# Patient Record
Sex: Male | Born: 1945
Health system: Southern US, Community
[De-identification: ages and names within clinical notes are randomized; demographics above are authoritative.]

## PROBLEM LIST (undated history)

## (undated) DIAGNOSIS — Z8719 Personal history of other diseases of the digestive system: Secondary | ICD-10-CM

## (undated) DIAGNOSIS — K3580 Unspecified acute appendicitis: Secondary | ICD-10-CM

## (undated) DIAGNOSIS — Z950 Presence of cardiac pacemaker: Secondary | ICD-10-CM

## (undated) DIAGNOSIS — C801 Malignant (primary) neoplasm, unspecified: Secondary | ICD-10-CM

## (undated) DIAGNOSIS — M1990 Unspecified osteoarthritis, unspecified site: Secondary | ICD-10-CM

## (undated) DIAGNOSIS — I10 Essential (primary) hypertension: Secondary | ICD-10-CM

## (undated) DIAGNOSIS — I499 Cardiac arrhythmia, unspecified: Secondary | ICD-10-CM

## (undated) HISTORY — PX: CERVICAL SPINE SURGERY: SHX589

## (undated) HISTORY — PX: BACK SURGERY: SHX140

## (undated) HISTORY — PX: TRIGGER FINGER RELEASE: SHX641

---

## 2006-01-13 ENCOUNTER — Ambulatory Visit (HOSPITAL_COMMUNITY): Admission: RE | Admit: 2006-01-13 | Discharge: 2006-01-13 | Payer: Self-pay | Admitting: Gastroenterology

## 2007-05-08 ENCOUNTER — Emergency Department (HOSPITAL_COMMUNITY): Admission: EM | Admit: 2007-05-08 | Discharge: 2007-05-08 | Payer: Self-pay | Admitting: Emergency Medicine

## 2009-03-21 ENCOUNTER — Inpatient Hospital Stay (HOSPITAL_COMMUNITY): Admission: RE | Admit: 2009-03-21 | Discharge: 2009-03-23 | Payer: Self-pay | Admitting: Orthopedic Surgery

## 2010-07-24 LAB — CBC
HCT: 43.1 % (ref 39.0–52.0)
Hemoglobin: 14.9 g/dL (ref 13.0–17.0)
MCHC: 34.5 g/dL (ref 30.0–36.0)
MCV: 98.5 fL (ref 78.0–100.0)
Platelets: 255 10*3/uL (ref 150–400)
RBC: 4.38 MIL/uL (ref 4.22–5.81)
RDW: 12.6 % (ref 11.5–15.5)
WBC: 5.2 10*3/uL (ref 4.0–10.5)

## 2010-08-23 ENCOUNTER — Other Ambulatory Visit (HOSPITAL_COMMUNITY): Payer: Self-pay | Admitting: Orthopedic Surgery

## 2010-08-23 ENCOUNTER — Encounter (HOSPITAL_COMMUNITY)
Admission: RE | Admit: 2010-08-23 | Discharge: 2010-08-23 | Disposition: A | Discharge status: Home / Self Care | Payer: Worker's Compensation | Source: Ambulatory Visit | Attending: Orthopedic Surgery | Admitting: Orthopedic Surgery

## 2010-08-23 ENCOUNTER — Ambulatory Visit (HOSPITAL_COMMUNITY)
Admission: RE | Admit: 2010-08-23 | Discharge: 2010-08-23 | Disposition: A | Discharge status: Home / Self Care | Payer: Worker's Compensation | Source: Ambulatory Visit | Attending: Orthopedic Surgery | Admitting: Orthopedic Surgery

## 2010-08-23 DIAGNOSIS — Z01818 Encounter for other preprocedural examination: Secondary | ICD-10-CM | POA: Insufficient documentation

## 2010-08-23 DIAGNOSIS — M5136 Other intervertebral disc degeneration, lumbar region: Secondary | ICD-10-CM

## 2010-08-23 DIAGNOSIS — M545 Low back pain, unspecified: Secondary | ICD-10-CM | POA: Insufficient documentation

## 2010-08-23 DIAGNOSIS — M5137 Other intervertebral disc degeneration, lumbosacral region: Secondary | ICD-10-CM | POA: Insufficient documentation

## 2010-08-23 DIAGNOSIS — M51379 Other intervertebral disc degeneration, lumbosacral region without mention of lumbar back pain or lower extremity pain: Secondary | ICD-10-CM | POA: Insufficient documentation

## 2010-08-23 DIAGNOSIS — Z01812 Encounter for preprocedural laboratory examination: Secondary | ICD-10-CM | POA: Insufficient documentation

## 2010-08-23 LAB — CBC
HCT: 44.3 % (ref 39.0–52.0)
Hemoglobin: 15.4 g/dL (ref 13.0–17.0)
MCH: 32.4 pg (ref 26.0–34.0)
MCHC: 34.8 g/dL (ref 30.0–36.0)
MCV: 93.1 fL (ref 78.0–100.0)
Platelets: 242 10*3/uL (ref 150–400)
RBC: 4.76 MIL/uL (ref 4.22–5.81)
RDW: 12.4 % (ref 11.5–15.5)
WBC: 5.3 10*3/uL (ref 4.0–10.5)

## 2010-08-23 LAB — TYPE AND SCREEN
ABO/RH(D): O NEG
Antibody Screen: NEGATIVE

## 2010-08-23 LAB — SURGICAL PCR SCREEN
MRSA, PCR: NEGATIVE
Staphylococcus aureus: NEGATIVE

## 2010-08-23 LAB — ABO/RH: ABO/RH(D): O NEG

## 2010-08-29 ENCOUNTER — Inpatient Hospital Stay (HOSPITAL_COMMUNITY): Payer: Worker's Compensation

## 2010-08-29 ENCOUNTER — Inpatient Hospital Stay (HOSPITAL_COMMUNITY)
Admission: RE | Admit: 2010-08-29 | Discharge: 2010-08-31 | DRG: 460 | Disposition: A | Discharge status: Home Health | Payer: Worker's Compensation | Source: Ambulatory Visit | Attending: Orthopedic Surgery | Admitting: Orthopedic Surgery

## 2010-08-29 DIAGNOSIS — M51379 Other intervertebral disc degeneration, lumbosacral region without mention of lumbar back pain or lower extremity pain: Principal | ICD-10-CM | POA: Diagnosis present

## 2010-08-29 DIAGNOSIS — M545 Low back pain, unspecified: Secondary | ICD-10-CM

## 2010-08-29 DIAGNOSIS — M5137 Other intervertebral disc degeneration, lumbosacral region: Principal | ICD-10-CM | POA: Diagnosis present

## 2010-08-30 ENCOUNTER — Inpatient Hospital Stay (HOSPITAL_COMMUNITY): Payer: Worker's Compensation

## 2010-08-30 DIAGNOSIS — M79609 Pain in unspecified limb: Secondary | ICD-10-CM

## 2010-09-02 NOTE — Op Note (Signed)
William Avila, William Avila           ACCOUNT NO.:  [id number]  MEDICAL RECORD NO.:  [id number]           PATIENT TYPE:  I  LOCATION:  5005                         FACILITY:  [facility]  PHYSICIAN:  Juleen China IV, MDDATE OF BIRTH:  [birth year]  DATE OF PROCEDURE:  08/29/2010 DATE OF DISCHARGE:                              OPERATIVE REPORT   PREOPERATIVE DIAGNOSIS:  Degenerative back disease, L4-L5, L5-S1.  POSTOPERATIVE DIAGNOSIS:  Degenerative back disease, L4-L5, L5-S1.  PROCEDURE PERFORMED:  Anterior exposure of the L4-L5, L5-S1 vertebral bodies.  SURGEON: 1. Charlena Cross, MD  CO-SURGEON:  Alvy Beal, MD.  ANESTHESIA:  General.  BLOOD LOSS:  See anesthesia record.  COMPLICATIONS:  None.  INDICATION:  This is a 65 year old gentleman who Dr. Shon Baton has evaluated and deemed a candidate for anterior instrumentation of the L4- L5, L5-S1 vertebral bodies.  I have been asked to perform anterior exposure.  I met the patient in the preoperative holding area and discussed the risks and benefits including injury to the artery, nerve, and ureter as well as possible complications of retrograde ejaculation. The patient understands all these and wishes to proceed.  DESCRIPTION OF PROCEDURE:  The patient was identified in the holding area and taken to room 15, placed supine on the table.  General endotracheal anesthesia was administered.  The patient was prepped and draped in usual fashion.  Time-out was called.  Antibiotics were given. Pulse oximeter monitor was placed on the patient's left foot.  Fluoroscopy was used to confirm the location of the vertebral bodies and was marked with an ink pen.  A paramedian incision was made below the umbilicus on the left side.  Cautery was used to divide the subcutaneous tissue.  The abdominal wall fascia was identified and then opened with cautery throughout the length of the incision.  The rectus muscle was mobilized along its  medial border.  The retroperitoneal space was identified and entered and then the abdominal contents were bluntly swept medially and superiorly.  We identified the external iliac artery and vein as well as the ureter.  We got all into the wrong plane and, therefore, entered the retroperitoneal space from the lateral edge of the rectus, again sweeping the abdominal contents and peritoneum medially and superiorly.  There were a few rents in the peritoneum which were closed with 3-0 interrupted Vicryl.  Once in the appropriate plane, the external iliac artery and vein were skeletonized.  The artery was with minimal calcification.  The ureter had to be dissected free a little bit as it was somewhat adherent and took somewhat to serve tissue scores in the wound, it was fully protected.  A Thompson retractor was placed and the L4-L5 disk space was exposed with the artery and vein swept lateral within the crotch of the bifurcation.  Dr. Shon Baton then performed L5-S1 instrumentation.  Once this was done, I came back into the room and provided exposure of the L4-L5 disk space.  In order to do this, a large iliolumbar vein was divided between 3-0 silk ties.  Two other small iliolumbar branches were divided between metal clips.  At this time, the  artery and vein were swept medially.  The Thompson retractor was replaced exposing the L4-L5 disk space.  Dr. Shon Baton then proceeded with instrumentation of the L4-L5 disk space.  Once he was completed, I came back into the room and inspected for hemostasis. Bleeding was minimal.  Appropriate hemostatic agents were placed.  The patient had a palpable left femoral pulse with no visual evidence of injury to the artery, vein, or ureter.  The contents were placed back into their anatomic positions and the fascia was closed with a running #1 PDS suture, the subcutaneous tissue was closed with 2-0 Vicryl, the skin was closed 4-0 Vicryl.  The patient tolerated the  procedure well. There were no immediate complications.     William Ny, MD     VWB/MEDQ  D:  08/30/2010  T:  08/30/2010  Job:  [id number]  Electronically Signed by Arelia Longest IV MD on 09/02/2010 11:00:16 PM

## 2010-09-02 NOTE — Op Note (Signed)
William Avila, William NO.:  [id number]  MEDICAL RECORD NO.:  [id number]           PATIENT TYPE:  I  LOCATION:  5005                         FACILITY:  [facility]  PHYSICIAN:  Alvy Beal, MD    DATE OF BIRTH:  Jun 02, 1945  DATE OF PROCEDURE:  08/29/2010 DATE OF DISCHARGE:                              OPERATIVE REPORT   PREOPERATIVE DIAGNOSIS:  Diskogenic low back pain L4-5, L5-S1.  POSTOPERATIVE DIAGNOSIS:  Diskogenic low back pain L4-5, L5-S1.  OPERATIVE PROCEDURE:  Anterior lumbar interbody fusion L4-5, L5-S1.  APPROACH SURGEON: 1. Durene Cal IV, MD  HISTORY:  This is a very pleasant 65 year old gentleman who I have been taking care for sometime.  He has been complaining of severe progressive disabling low back, buttock pain.  Attempts at conservative management had all failed to alleviate his symptoms.  After discussing treatment options we elected to proceed with surgery.  All appropriate risks, benefits and alternatives were discussed with the patient.  Consent was obtained.  Intraoperative device system used was the RSB InterPlate, PEEK spacer with the RSB zero profile anterior lumbar plate.  At L5-S1, I used a 14 8-degree lordotic large PEEK spacer with appropriate sized zero profile plate with the gold screws.  At L4-5 used a 16 mm high 8- degree lordotic large PEEK spacer with appropriate sized zero profile plate.  COMPLICATIONS:  None.  CONDITION:  Stable.  OPERATIVE NOTE:  The patient was brought to the operating room and placed supine on the operating table.  After successful induction of general anesthesia, endotracheal intubation, TEDs, SCDs and Foley were inserted.  The abdomen was prepped and draped in the usual fashion. Appropriate time-out was done to confirm patient, procedure and all other pertinent important data.  Once this was done, Dr. Durene Cal then performed a standard retroperitoneal approach to the lumbar  spine. Please refer to his dictation for specifics.  Once the [place] blades were placed, we had an excellent exposure at the L5-S1 level.  I then incised the annulus with a 10 blade scalpel, then used a combination of various pituitary rongeurs, curettes and Kerrison rongeurs to remove all the disk material.  Once I had the bulk of the diskectomy completed I used a small curved curette to release the annulus from the posterior aspect of the L5-S1 vertebral bodies.  Once I had done this, I was able to use a 2-mm Kerrison rongeur to remove the remaining portion of the annulus to adequately decompress and remove the bone spurs from the back of the vertebral bodies.  Once I had an adequate diskectomy and decompression, I rasped the endplates and used trial devices to determine the appropriate size.  Once I had the appropriate sized placed, I then aspirated 10 mL of blood from the vertebral body, mixed the chronOS and then packed the 14 8-degree lordotic large cage with chronOS and EBX.  This was malleted to the appropriate depth.  I then took the appropriate size large zero profile plate, secured it at the appropriate depth and then placed the two 25-mm gold screws going cephalad into the L5 vertebral body and one caudad into  the S1 vertebral body.  I had excellent purchase with the screws. I then placed a locking cap and torqued into the appropriate tension. At this point, Dr. Myra Gianotti then scrubbed back into the case.  We irrigated the wound copiously with normal saline and then he repositioned the retractors to now expose the L4-5 disk space.  Using the same technique I used at L5-S1, I removed all the disk material at L4-5.  Again, I released the annulus from the posterior aspect of the vertebral body with a small hook curette, and then used 3- mm Kerrison to remove the remains of it.  I then rasped the endplates so I had excellent bleeding bone and this time I used a 16 trial  with better fit.  I packed the 16 8-degree lordotic cage and then malleted into the appropriate depth.  I then took the appropriate size zero profile plate, malleted it to the appropriate position and then secured it in the same fashion.  At this point, Dr. Myra Gianotti and I irrigated copiously with normal saline and made sure we had hemostasis.  Once hemostasis was confirmed, we removed the retractors and the wound remained dry.  He then closed the wound in an appropriate fashion.  The patient was extubated and transferred to the PACU without incident.  At the end of the case, all needle and sponge counts were correct.     Alvy Beal, MD     DDB/MEDQ  D:  08/29/2010  T:  08/30/2010  Job:  [id number]  Electronically Signed by Venita Lick MD on 09/02/2010 08:40:49 PM

## 2010-09-06 ENCOUNTER — Ambulatory Visit (HOSPITAL_COMMUNITY)
Admission: RE | Admit: 2010-09-06 | Discharge: 2010-09-06 | Disposition: A | Discharge status: Home / Self Care | Payer: Worker's Compensation | Source: Ambulatory Visit | Attending: Orthopedic Surgery | Admitting: Orthopedic Surgery

## 2010-09-06 DIAGNOSIS — M79609 Pain in unspecified limb: Secondary | ICD-10-CM | POA: Insufficient documentation

## 2010-09-06 DIAGNOSIS — M7989 Other specified soft tissue disorders: Secondary | ICD-10-CM | POA: Insufficient documentation

## 2010-09-14 ENCOUNTER — Emergency Department (HOSPITAL_COMMUNITY)
Admission: EM | Admit: 2010-09-14 | Discharge: 2010-09-15 | Disposition: A | Discharge status: Home / Self Care | Payer: Worker's Compensation | Attending: Emergency Medicine | Admitting: Emergency Medicine

## 2010-09-14 DIAGNOSIS — I10 Essential (primary) hypertension: Secondary | ICD-10-CM | POA: Insufficient documentation

## 2010-09-14 DIAGNOSIS — M549 Dorsalgia, unspecified: Secondary | ICD-10-CM | POA: Insufficient documentation

## 2010-09-14 DIAGNOSIS — M79609 Pain in unspecified limb: Secondary | ICD-10-CM | POA: Insufficient documentation

## 2010-09-14 LAB — URINALYSIS, ROUTINE W REFLEX MICROSCOPIC
Bilirubin Urine: NEGATIVE
Glucose, UA: NEGATIVE mg/dL
Hgb urine dipstick: NEGATIVE
Ketones, ur: NEGATIVE mg/dL
Nitrite: NEGATIVE
Protein, ur: NEGATIVE mg/dL
Specific Gravity, Urine: 1.019 (ref 1.005–1.030)
Urobilinogen, UA: 0.2 mg/dL (ref 0.0–1.0)
pH: 7.5 (ref 5.0–8.0)

## 2010-09-14 LAB — BASIC METABOLIC PANEL
BUN: 13 mg/dL (ref 6–23)
CO2: 28 mEq/L (ref 19–32)
Calcium: 9.4 mg/dL (ref 8.4–10.5)
Chloride: 99 mEq/L (ref 96–112)
Creatinine, Ser: 0.97 mg/dL (ref 0.4–1.5)
GFR calc Af Amer: >60 mL/min (ref >60)
GFR calc non Af Amer: >60 mL/min (ref >60)
Glucose, Bld: 102 mg/dL — ABNORMAL HIGH (ref 70–99)
Potassium: 3.9 mEq/L (ref 3.5–5.1)
Sodium: 138 mEq/L (ref 135–145)

## 2010-09-14 LAB — SEDIMENTATION RATE: Sed Rate: 11 mm/hr (ref 0–16)

## 2010-09-14 LAB — CBC
HCT: 39.6 % (ref 39.0–52.0)
Hemoglobin: 13.7 g/dL (ref 13.0–17.0)
MCH: 32.7 pg (ref 26.0–34.0)
MCHC: 34.6 g/dL (ref 30.0–36.0)
MCV: 94.5 fL (ref 78.0–100.0)
Platelets: 475 10*3/uL — ABNORMAL HIGH (ref 150–400)
RBC: 4.19 MIL/uL — ABNORMAL LOW (ref 4.22–5.81)
RDW: 12.5 % (ref 11.5–15.5)
WBC: 8.3 10*3/uL (ref 4.0–10.5)

## 2010-09-14 LAB — C-REACTIVE PROTEIN: CRP: 0.5 mg/dL — ABNORMAL LOW (ref <0.6)

## 2010-10-06 NOTE — Discharge Summary (Signed)
  NAMELEX, LINHARES NO.:  [id number]  MEDICAL RECORD NO.:  [id number]  LOCATION:  5005                         FACILITY:  [facility]  PHYSICIAN:  Alvy Beal, MD    DATE OF BIRTH:  18-Jan-1946  DATE OF ADMISSION:  08/29/2010 DATE OF DISCHARGE:  08/31/2010                              DISCHARGE SUMMARY   ADMITTING DIAGNOSIS:  Diskogenic low back pain, L4-L5, L5-S1.  HISTORY:  This is a very pleasant 65 year old gentleman who I had been taking care over the past.  He has been having significant progressive low back, buttock, and bilateral leg pain ever since a work-related injury.  After failed attempts at conservative management, he elected to proceed with surgery.  All appropriate risks, benefits, and alternatives were discussed with the patient and consent was obtained.  Please refer to the included office notes in his hospital chart for specifics on the past medical, surgical, family, and social history.  HOSPITAL COURSE:  On the date of admission, the patient was taken to the operating room for anterior lumbar interbody fusion at L4-L5 and L5-S1. Surgery was uneventful.  There were no significant intraoperative complications.  On postoperative day #1, he was alert and oriented x3.  Compartments were soft and nontender and his pain was well controlled.  The patient had a lower extremity Doppler test done which was negative for DVT. Postoperative x-rays were satisfactory.  Hardware and grafts were in good position.  The patient remained with intact peripheral pulses.  He had cleared PT.  As a result, he was discharged to home on Aug 31, 2010 with appropriate medications and postoperative preprinted instructions. The patient had a followup arranged with me.     Alvy Beal, MD     DDB/MEDQ  D:  10/01/2010  T:  10/02/2010  Job:  [id number]  Electronically Signed by Venita Lick MD on 10/06/2010 07:21:56 AM

## 2011-01-10 LAB — I-STAT 8, (EC8 V) (CONVERTED LAB)
Acid-base deficit: 2
BUN: 17
Bicarbonate: 23.2
Chloride: 104
Glucose, Bld: 133 — ABNORMAL HIGH
HCT: 54 — ABNORMAL HIGH
Hemoglobin: 18.4 — ABNORMAL HIGH
Operator id: 294511
Potassium: 3.4 — ABNORMAL LOW
Sodium: 140
TCO2: 24
pCO2, Ven: 41.6 — ABNORMAL LOW
pH, Ven: 7.355 — ABNORMAL HIGH

## 2011-01-10 LAB — POCT I-STAT CREATININE
Creatinine, Ser: 1.3
Operator id: 294511

## 2013-02-23 ENCOUNTER — Ambulatory Visit
Admission: RE | Admit: 2013-02-23 | Discharge: 2013-02-23 | Disposition: A | Discharge status: Home / Self Care | Payer: Self-pay | Source: Ambulatory Visit | Attending: Internal Medicine | Admitting: Internal Medicine

## 2013-02-23 ENCOUNTER — Other Ambulatory Visit: Payer: Self-pay | Admitting: Internal Medicine

## 2013-02-23 DIAGNOSIS — M79641 Pain in right hand: Secondary | ICD-10-CM

## 2014-01-20 ENCOUNTER — Encounter (HOSPITAL_COMMUNITY): Payer: Self-pay | Admitting: Emergency Medicine

## 2014-01-20 ENCOUNTER — Inpatient Hospital Stay (HOSPITAL_COMMUNITY)
Admission: EM | Admit: 2014-01-20 | Discharge: 2014-01-23 | DRG: 343 | Disposition: A | Discharge status: Home / Self Care | Payer: Medicare Other | Attending: General Surgery | Admitting: General Surgery

## 2014-01-20 DIAGNOSIS — R109 Unspecified abdominal pain: Secondary | ICD-10-CM | POA: Diagnosis not present

## 2014-01-20 DIAGNOSIS — Z885 Allergy status to narcotic agent status: Secondary | ICD-10-CM | POA: Diagnosis not present

## 2014-01-20 DIAGNOSIS — K3580 Unspecified acute appendicitis: Principal | ICD-10-CM | POA: Diagnosis present

## 2014-01-20 DIAGNOSIS — Z87891 Personal history of nicotine dependence: Secondary | ICD-10-CM | POA: Diagnosis not present

## 2014-01-20 DIAGNOSIS — K37 Unspecified appendicitis: Secondary | ICD-10-CM | POA: Diagnosis present

## 2014-01-20 MED ORDER — ACETAMINOPHEN 650 MG RE SUPP: 650.0000 mg | Freq: Four times a day (QID) | RECTAL | Status: DC | PRN

## 2014-01-20 MED ORDER — ONDANSETRON HCL 4 MG/2ML IJ SOLN
4.0000 mg | Freq: Four times a day (QID) | INTRAMUSCULAR | Status: DC | PRN
  Administered 2014-01-22 (×2): 4 mg via INTRAVENOUS
  Filled 2014-01-20 (×2): qty 2

## 2014-01-20 MED ORDER — ENOXAPARIN SODIUM 40 MG/0.4ML ~~LOC~~ SOLN
40.0000 mg | SUBCUTANEOUS | Status: DC
  Filled 2014-01-20: qty 0.4

## 2014-01-20 MED ORDER — SODIUM CHLORIDE 0.9 % IV BOLUS (SEPSIS)
1000.0000 mL | Freq: Once | INTRAVENOUS | Status: AC
  Administered 2014-01-20: 1000 mL via INTRAVENOUS

## 2014-01-20 MED ORDER — SODIUM CHLORIDE 0.9 % IV SOLN
INTRAVENOUS | Status: DC
  Administered 2014-01-20: via INTRAVENOUS

## 2014-01-20 MED ORDER — PIPERACILLIN-TAZOBACTAM 3.375 G IVPB
3.3750 g | Freq: Three times a day (TID) | INTRAVENOUS | Status: DC
  Administered 2014-01-21 – 2014-01-23 (×8): 3.375 g via INTRAVENOUS
  Filled 2014-01-20 (×11): qty 50

## 2014-01-20 MED ORDER — PANTOPRAZOLE SODIUM 40 MG IV SOLR
40.0000 mg | Freq: Every day | INTRAVENOUS | Status: DC
  Administered 2014-01-20 – 2014-01-22 (×3): 40 mg via INTRAVENOUS
  Filled 2014-01-20 (×4): qty 40

## 2014-01-20 MED ORDER — HYDROMORPHONE HCL 1 MG/ML IJ SOLN
1.0000 mg | INTRAMUSCULAR | Status: DC | PRN
  Administered 2014-01-21: 1 mg via INTRAVENOUS
  Filled 2014-01-20: qty 1

## 2014-01-20 MED ORDER — ACETAMINOPHEN 325 MG PO TABS
650.0000 mg | ORAL_TABLET | Freq: Four times a day (QID) | ORAL | Status: DC | PRN
  Administered 2014-01-22 – 2014-01-23 (×3): 650 mg via ORAL
  Filled 2014-01-20 (×3): qty 2

## 2014-01-20 NOTE — ED Notes (Signed)
Per [facility] pt is from [facility], reports pt is having an acute appendicitis. Pt reports RLQ abdominal pain started on Wednesday with associated N/V/D. [place] adm 4mg  of morphine, 2 mg of dilaudid, 4mg  of zofran. Dr. Donne Hazel to see. [facility] placed pt on 2L 02 as pt's 02 sats were 89% on RA. Pt is A&O X4.

## 2014-01-20 NOTE — ED Provider Notes (Signed)
9:31 PM Patient sent from [facility].  Pain controlled at this time.  Abdominal exam: soft, nondistended, TTP RUQ, RLQ, no guarding or rebound.  Dr Donne Hazel to see patient, I have asked secretary to page him.  No needs at this time.  Pt reports NPO since 2pm.   Clayton Bibles, PA-C 01/20/14 [id number]

## 2014-01-20 NOTE — H&P (Signed)
William Avila is an 68 y.o. male.   Chief Complaint: ab pain HPI: 68yo otherwise healthy male who is up to date on his colonscopies who began having some lower abdominal pain on Wednesday that has persisted. It is mostly in his rlq now.  He has not been eating. He has not been able to drink without dry heaves. Passing some flatus, no bm.  No fevers. He has not voided since early this am and has not received any fluid per his report.  He complains of only rlq pain that is better with pain meds. He presented to [facility] hospital and was transferred here. His wbc was 20, k 3.4, cr nl. I have reviewed ct and he has distended appendix it appears with possibly some extraluminal dots of air and some inflammation.  He also has a large calcified mass that appears according to radiology to come from right adrenal. He denies any prior history.  This will need evaluation at later date also.  History reviewed. No pertinent past medical history.  Past Surgical History  Procedure Laterality Date  . Back surgery    cervical and lumbar surgery  No family history on file. Social History:  reports that he has quit smoking. His smoking use included Cigarettes. He smoked 0.00 packs per day. He does not have any smokeless tobacco history on file. He reports that he drinks alcohol. His drug history is not on file.  Allergies:  Allergies  Allergen Reactions  . Codeine Anaphylaxis  . Ibuprofen Anaphylaxis    meds reviewed  Review of Systems  Constitutional: Negative for fever and chills.  Respiratory: Negative for shortness of breath.   Cardiovascular: Negative for chest pain.  Gastrointestinal: Positive for nausea, vomiting, abdominal pain and diarrhea.  Genitourinary: Negative for dysuria, urgency and frequency.    Blood pressure 151/87, pulse 64, temperature 99.1 F (37.3 C), temperature source Oral, resp. rate 18, height 6' (1.829 m), weight 176 lb (79.833 kg), SpO2 93.00%. Physical Exam  Vitals  reviewed. Constitutional: He is oriented to person, place, and time. He appears well-developed and well-nourished. No distress.  Eyes: No scleral icterus.  Cardiovascular: Normal rate, regular rhythm and normal heart sounds.   Respiratory: Effort normal and breath sounds normal. He has no wheezes. He has no rales.  GI: Soft. Bowel sounds are normal. There is tenderness in the right lower quadrant. There is tenderness at McBurney's point.  Neurological: He is alert and oriented to person, place, and time.     Assessment/Plan appendicitis  His heart rate is nl, bp normal, only localized rlq tenderness.  He has not really been resuscitated at all at this point. I am going to give bolus, put on iv fluids to hydrate him. Will recheck labs early am and plan for lap appy tomorrow. He has been given zosyn and I will keep him on that for that reason.    Marcy Sookdeo 01/20/2014, 10:32 PM

## 2014-01-20 NOTE — Progress Notes (Signed)
ANTIBIOTIC CONSULT NOTE - INITIAL  Pharmacy Consult for zosyn Indication: appendicitis   Allergies  Allergen Reactions  . Codeine Anaphylaxis  . Ibuprofen Anaphylaxis    Patient Measurements: Height: 6' (182.9 cm) Weight: 176 lb (79.833 kg) IBW/kg (Calculated) : 77.6   Vital Signs: Temp: 99.1 F (37.3 C) (10/02 2108) Temp Source: Oral (10/02 2108) BP: 157/92 mmHg (10/02 2300) Pulse Rate: 65 (10/02 2300) Intake/Output from previous day:   Intake/Output from this shift:    Labs: No results found for this basename: WBC, HGB, PLT, LABCREA, CREATININE,  in the last 72 hours Estimated Creatinine Clearance: 80 ml/min (by C-G formula based on Cr of 0.97). No results found for this basename: VANCOTROUGH, VANCOPEAK, VANCORANDOM, GENTTROUGH, GENTPEAK, GENTRANDOM, TOBRATROUGH, TOBRAPEAK, TOBRARND, AMIKACINPEAK, AMIKACINTROU, AMIKACIN,  in the last 72 hours   Microbiology: No results found for this or any previous visit (from the past 720 hour(s)).  Medical History: History reviewed. No pertinent past medical history.  Medications:  Prescriptions prior to admission  Medication Sig Dispense Refill  . Multiple Vitamins-Minerals (MULTIVITAMIN WITH MINERALS) tablet Take 1 tablet by mouth daily.       Assessment: 68 yo man transferred from [facility] for evaluation of abdominal pain.  He received zosyn 4.5 gm @ 18:28.  His SrCr is 0.9 mg/dL.  Goal of Therapy:  Eradication of infection  Plan:  Zosyn 3.375 gm IV q8 hours. Monitor clinical course.  Thanks for allowing pharmacy to be a part of this patient's care.  Excell Seltzer, PharmD Clinical Pharmacist, [phone] 01/20/2014,11:34 PM

## 2014-01-20 NOTE — ED Notes (Signed)
MD at bedside. 

## 2014-01-21 ENCOUNTER — Encounter (HOSPITAL_COMMUNITY): Admission: EM | Disposition: A | Discharge status: Home / Self Care | Payer: Self-pay | Source: Home / Self Care

## 2014-01-21 ENCOUNTER — Encounter (HOSPITAL_COMMUNITY): Payer: Medicare Other | Admitting: Certified Registered"

## 2014-01-21 ENCOUNTER — Inpatient Hospital Stay (HOSPITAL_COMMUNITY): Payer: Medicare Other | Admitting: Certified Registered"

## 2014-01-21 DIAGNOSIS — K3580 Unspecified acute appendicitis: Secondary | ICD-10-CM

## 2014-01-21 HISTORY — PX: LAPAROSCOPIC APPENDECTOMY: SHX408

## 2014-01-21 HISTORY — DX: Unspecified acute appendicitis: K35.80

## 2014-01-21 LAB — SURGICAL PCR SCREEN
MRSA, PCR: NEGATIVE
Staphylococcus aureus: NEGATIVE

## 2014-01-21 LAB — BASIC METABOLIC PANEL
Anion gap: 13 (ref 5–15)
BUN: 12 mg/dL (ref 6–23)
CO2: 23 mEq/L (ref 19–32)
Calcium: 8.8 mg/dL (ref 8.4–10.5)
Chloride: 103 mEq/L (ref 96–112)
Creatinine, Ser: 0.84 mg/dL (ref 0.50–1.35)
GFR calc Af Amer: >90 mL/min (ref >90)
GFR calc non Af Amer: 88 mL/min — ABNORMAL LOW (ref >90)
Glucose, Bld: 134 mg/dL — ABNORMAL HIGH (ref 70–99)
Potassium: 4 mEq/L (ref 3.7–5.3)
Sodium: 139 mEq/L (ref 137–147)

## 2014-01-21 LAB — CBC
HCT: 39.5 % (ref 39.0–52.0)
Hemoglobin: 14 g/dL (ref 13.0–17.0)
MCH: 32 pg (ref 26.0–34.0)
MCHC: 35.4 g/dL (ref 30.0–36.0)
MCV: 90.4 fL (ref 78.0–100.0)
Platelets: 230 10*3/uL (ref 150–400)
RBC: 4.37 MIL/uL (ref 4.22–5.81)
RDW: 12.9 % (ref 11.5–15.5)
WBC: 17.7 10*3/uL — ABNORMAL HIGH (ref 4.0–10.5)

## 2014-01-21 SURGERY — APPENDECTOMY, LAPAROSCOPIC
Anesthesia: General | Site: Abdomen

## 2014-01-21 MED ORDER — OXYCODONE HCL 5 MG PO TABS
ORAL_TABLET | ORAL | Status: AC
  Administered 2014-01-21: 5 mg via ORAL
  Filled 2014-01-21: qty 1

## 2014-01-21 MED ORDER — NEOSTIGMINE METHYLSULFATE 10 MG/10ML IV SOLN
INTRAVENOUS | Status: DC | PRN
  Administered 2014-01-21: 4 mg via INTRAVENOUS

## 2014-01-21 MED ORDER — PHENYLEPHRINE HCL 10 MG/ML IJ SOLN
INTRAMUSCULAR | Status: DC | PRN
  Administered 2014-01-21: 80 ug via INTRAVENOUS

## 2014-01-21 MED ORDER — OXYCODONE HCL 5 MG/5ML PO SOLN: 5.0000 mg | Freq: Once | ORAL | Status: AC | PRN

## 2014-01-21 MED ORDER — HYDROMORPHONE HCL 1 MG/ML IJ SOLN
0.2500 mg | INTRAMUSCULAR | Status: DC | PRN
  Administered 2014-01-21 (×4): 0.5 mg via INTRAVENOUS

## 2014-01-21 MED ORDER — BUPIVACAINE-EPINEPHRINE (PF) 0.25% -1:200000 IJ SOLN
INTRAMUSCULAR | Status: AC
  Filled 2014-01-21: qty 30

## 2014-01-21 MED ORDER — LIDOCAINE HCL (CARDIAC) 20 MG/ML IV SOLN
INTRAVENOUS | Status: DC | PRN
  Administered 2014-01-21: 80 mg via INTRAVENOUS

## 2014-01-21 MED ORDER — EPHEDRINE SULFATE 50 MG/ML IJ SOLN
INTRAMUSCULAR | Status: DC | PRN
  Administered 2014-01-21: 10 mg via INTRAVENOUS

## 2014-01-21 MED ORDER — SODIUM CHLORIDE 0.9 % IR SOLN
Status: DC | PRN
  Administered 2014-01-21: 1000 mL

## 2014-01-21 MED ORDER — LACTATED RINGERS IV SOLN
INTRAVENOUS | Status: DC | PRN
Start: 2014-01-21 — End: 2014-01-21
  Administered 2014-01-21 (×2): via INTRAVENOUS

## 2014-01-21 MED ORDER — HEPARIN SODIUM (PORCINE) 5000 UNIT/ML IJ SOLN
5000.0000 [IU] | Freq: Three times a day (TID) | INTRAMUSCULAR | Status: DC
  Administered 2014-01-22 – 2014-01-23 (×4): 5000 [IU] via SUBCUTANEOUS
  Filled 2014-01-21 (×7): qty 1

## 2014-01-21 MED ORDER — FENTANYL CITRATE 0.05 MG/ML IJ SOLN
50.0000 ug | INTRAMUSCULAR | Status: DC | PRN
  Administered 2014-01-21 – 2014-01-23 (×7): 50 ug via INTRAVENOUS
  Filled 2014-01-21 (×7): qty 2

## 2014-01-21 MED ORDER — PROPOFOL 10 MG/ML IV BOLUS
INTRAVENOUS | Status: DC | PRN
  Administered 2014-01-21: 120 mg via INTRAVENOUS

## 2014-01-21 MED ORDER — BUPIVACAINE-EPINEPHRINE 0.25% -1:200000 IJ SOLN
INTRAMUSCULAR | Status: DC | PRN
  Administered 2014-01-21: 30 mL

## 2014-01-21 MED ORDER — DEXAMETHASONE SODIUM PHOSPHATE 10 MG/ML IJ SOLN
INTRAMUSCULAR | Status: DC | PRN
  Administered 2014-01-21: 4 mg via INTRAVENOUS

## 2014-01-21 MED ORDER — HYDROMORPHONE HCL 1 MG/ML IJ SOLN
INTRAMUSCULAR | Status: AC
  Administered 2014-01-21: 0.5 mg via INTRAVENOUS
  Filled 2014-01-21: qty 1

## 2014-01-21 MED ORDER — KCL IN DEXTROSE-NACL 20-5-0.9 MEQ/L-%-% IV SOLN
INTRAVENOUS | Status: DC
  Administered 2014-01-21 – 2014-01-23 (×4): via INTRAVENOUS
  Filled 2014-01-21 (×6): qty 1000

## 2014-01-21 MED ORDER — ONDANSETRON HCL 4 MG PO TABS: 4.0000 mg | ORAL_TABLET | Freq: Four times a day (QID) | ORAL | Status: DC | PRN

## 2014-01-21 MED ORDER — SCOPOLAMINE 1 MG/3DAYS TD PT72
MEDICATED_PATCH | TRANSDERMAL | Status: AC
  Filled 2014-01-21: qty 1

## 2014-01-21 MED ORDER — FENTANYL CITRATE 0.05 MG/ML IJ SOLN
INTRAMUSCULAR | Status: DC | PRN
  Administered 2014-01-21 (×5): 50 ug via INTRAVENOUS

## 2014-01-21 MED ORDER — GLYCOPYRROLATE 0.2 MG/ML IJ SOLN
INTRAMUSCULAR | Status: DC | PRN
  Administered 2014-01-21: 0.6 mg via INTRAVENOUS

## 2014-01-21 MED ORDER — SCOPOLAMINE 1 MG/3DAYS TD PT72
1.0000 | MEDICATED_PATCH | Freq: Once | TRANSDERMAL | Status: DC
  Administered 2014-01-21: 1.5 mg via TRANSDERMAL

## 2014-01-21 MED ORDER — ROCURONIUM BROMIDE 100 MG/10ML IV SOLN
INTRAVENOUS | Status: DC | PRN
  Administered 2014-01-21: 10 mg via INTRAVENOUS
  Administered 2014-01-21: 30 mg via INTRAVENOUS

## 2014-01-21 MED ORDER — ONDANSETRON HCL 4 MG/2ML IJ SOLN: 4.0000 mg | Freq: Four times a day (QID) | INTRAMUSCULAR | Status: DC | PRN

## 2014-01-21 MED ORDER — ROCURONIUM BROMIDE 50 MG/5ML IV SOLN
INTRAVENOUS | Status: AC
  Filled 2014-01-21: qty 1

## 2014-01-21 MED ORDER — MIDAZOLAM HCL 2 MG/2ML IJ SOLN
INTRAMUSCULAR | Status: AC
  Filled 2014-01-21: qty 2

## 2014-01-21 MED ORDER — ONDANSETRON HCL 4 MG/2ML IJ SOLN
INTRAMUSCULAR | Status: DC | PRN
  Administered 2014-01-21: 4 mg via INTRAVENOUS

## 2014-01-21 MED ORDER — FENTANYL CITRATE 0.05 MG/ML IJ SOLN
INTRAMUSCULAR | Status: AC
Start: 2014-01-21 — End: 2014-01-21
  Filled 2014-01-21: qty 5

## 2014-01-21 MED ORDER — 0.9 % SODIUM CHLORIDE (POUR BTL) OPTIME
TOPICAL | Status: DC | PRN
  Administered 2014-01-21: 1000 mL

## 2014-01-21 MED ORDER — SUCCINYLCHOLINE CHLORIDE 20 MG/ML IJ SOLN
INTRAMUSCULAR | Status: DC | PRN
  Administered 2014-01-21: 100 mg via INTRAVENOUS

## 2014-01-21 MED ORDER — OXYCODONE HCL 5 MG PO TABS
5.0000 mg | ORAL_TABLET | Freq: Once | ORAL | Status: AC | PRN
  Administered 2014-01-21: 5 mg via ORAL

## 2014-01-21 MED ORDER — SUCCINYLCHOLINE CHLORIDE 20 MG/ML IJ SOLN
INTRAMUSCULAR | Status: AC
  Filled 2014-01-21: qty 1

## 2014-01-21 MED ORDER — MIDAZOLAM HCL 5 MG/5ML IJ SOLN
INTRAMUSCULAR | Status: DC | PRN
  Administered 2014-01-21: 2 mg via INTRAVENOUS

## 2014-01-21 MED ORDER — PROPOFOL 10 MG/ML IV BOLUS
INTRAVENOUS | Status: AC
  Filled 2014-01-21: qty 20

## 2014-01-21 SURGICAL SUPPLY — 39 items
ADH SKN CLS APL DERMABOND .7 (GAUZE/BANDAGES/DRESSINGS) ×1
APPLIER CLIP ROT 10 11.4 M/L (STAPLE)
APR CLP MED LRG 11.4X10 (STAPLE)
BAG SPEC RTRVL LRG 6X4 10 (ENDOMECHANICALS) ×1
BLADE SURG ROTATE 9660 (MISCELLANEOUS) IMPLANT
CANISTER SUCTION 2500CC (MISCELLANEOUS) ×2 IMPLANT
CHLORAPREP W/TINT 26ML (MISCELLANEOUS) ×2 IMPLANT
CLIP APPLIE ROT 10 11.4 M/L (STAPLE) IMPLANT
COVER SURGICAL LIGHT HANDLE (MISCELLANEOUS) ×2 IMPLANT
CUTTER FLEX LINEAR 45M (STAPLE) ×2 IMPLANT
DERMABOND ADVANCED (GAUZE/BANDAGES/DRESSINGS) ×1
DERMABOND ADVANCED .7 DNX12 (GAUZE/BANDAGES/DRESSINGS) ×1 IMPLANT
DRAPE UTILITY 15X26 W/TAPE STR (DRAPE) ×4 IMPLANT
ELECT REM PT RETURN 9FT ADLT (ELECTROSURGICAL) ×2
ELECTRODE REM PT RTRN 9FT ADLT (ELECTROSURGICAL) ×1 IMPLANT
ENDOLOOP SUT PDS II  0 18 (SUTURE)
ENDOLOOP SUT PDS II 0 18 (SUTURE) IMPLANT
GLOVE BIO SURGEON STRL SZ7.5 (GLOVE) ×2 IMPLANT
GOWN STRL REUS W/ TWL LRG LVL3 (GOWN DISPOSABLE) ×3 IMPLANT
GOWN STRL REUS W/TWL LRG LVL3 (GOWN DISPOSABLE) ×6
KIT BASIN OR (CUSTOM PROCEDURE TRAY) ×2 IMPLANT
KIT ROOM TURNOVER OR (KITS) ×2 IMPLANT
NS IRRIG 1000ML POUR BTL (IV SOLUTION) ×2 IMPLANT
PAD ARMBOARD 7.5X6 YLW CONV (MISCELLANEOUS) ×4 IMPLANT
POUCH SPECIMEN RETRIEVAL 10MM (ENDOMECHANICALS) ×2 IMPLANT
RELOAD STAPLE 45 3.5 BLU ETS (ENDOMECHANICALS) ×1 IMPLANT
RELOAD STAPLE TA45 3.5 REG BLU (ENDOMECHANICALS) ×2 IMPLANT
SCALPEL HARMONIC ACE (MISCELLANEOUS) ×2 IMPLANT
SET IRRIG TUBING LAPAROSCOPIC (IRRIGATION / IRRIGATOR) ×2 IMPLANT
SPECIMEN JAR SMALL (MISCELLANEOUS) ×2 IMPLANT
SUT MNCRL AB 4-0 PS2 18 (SUTURE) ×2 IMPLANT
TOWEL OR 17X24 6PK STRL BLUE (TOWEL DISPOSABLE) ×2 IMPLANT
TOWEL OR 17X26 10 PK STRL BLUE (TOWEL DISPOSABLE) ×2 IMPLANT
TRAY FOLEY CATH 16FR SILVER (SET/KITS/TRAYS/PACK) ×2 IMPLANT
TRAY LAPAROSCOPIC (CUSTOM PROCEDURE TRAY) ×2 IMPLANT
TROCAR BLADELESS 5MM (ENDOMECHANICALS) ×1 IMPLANT
TROCAR XCEL BLUNT TIP 100MML (ENDOMECHANICALS) ×2 IMPLANT
TROCAR XCEL NON-BLD 5MMX100MML (ENDOMECHANICALS) ×4 IMPLANT
TUBING INSUFFLATION (TUBING) ×2 IMPLANT

## 2014-01-21 NOTE — Anesthesia Preprocedure Evaluation (Addendum)
Anesthesia Evaluation  Patient identified by MRN, date of birth, ID band Patient awake    Reviewed: Allergy & Precautions, H&P , NPO status , Patient's Chart, lab work & pertinent test results, reviewed documented beta blocker date and time   History of Anesthesia Complications (+) PONV  Airway Mallampati: II TM Distance: >3 FB Neck ROM: Limited    Dental  (+) Teeth Intact, Dental Advisory Given   Pulmonary former smoker,          Cardiovascular negative cardio ROS  Rhythm:Regular Rate:Normal     Neuro/Psych negative psych ROS   GI/Hepatic negative GI ROS, Neg liver ROS,   Endo/Other  negative endocrine ROS  Renal/GU negative Renal ROS     Musculoskeletal negative musculoskeletal ROS (+)   Abdominal (+)  Abdomen: soft. Bowel sounds: normal.  Peds negative pediatric ROS (+)  Hematology negative hematology ROS (+)   Anesthesia Other Findings   Reproductive/Obstetrics negative OB ROS                         Anesthesia Physical Anesthesia Plan  ASA: I and emergent  Anesthesia Plan: General   Post-op Pain Management:    Induction: Intravenous, Rapid sequence and Cricoid pressure planned  Airway Management Planned: Oral ETT  Additional Equipment:   Intra-op Plan:   Post-operative Plan: Extubation in OR  Informed Consent: I have reviewed the patients History and Physical, chart, labs and discussed the procedure including the risks, benefits and alternatives for the proposed anesthesia with the patient or authorized representative who has indicated his/her understanding and acceptance.   Dental advisory given  Plan Discussed with: CRNA  Anesthesia Plan Comments:         Anesthesia Quick Evaluation

## 2014-01-21 NOTE — Transfer of Care (Signed)
Immediate Anesthesia Transfer of Care Note  Patient: William Avila  Procedure(s) Performed: Procedure(s): APPENDECTOMY LAPAROSCOPIC (N/A)  Patient Location: PACU  Anesthesia Type:General  Level of Consciousness: awake, alert  and oriented  Airway & Oxygen Therapy: Patient connected to face mask oxygen  Post-op Assessment: Report given to PACU RN  Post vital signs: stable  Complications: No apparent anesthesia complications

## 2014-01-21 NOTE — ED Provider Notes (Signed)
Medical screening examination/treatment/procedure(s) were performed by non-physician practitioner and as supervising physician I was immediately available for consultation/collaboration.   EKG Interpretation None       Babette Relic, MD 01/21/14 [phone]

## 2014-01-21 NOTE — Op Note (Signed)
01/20/2014 - 01/21/2014  9:50 AM  PATIENT:  William Avila  68 y.o. male  PRE-OPERATIVE DIAGNOSIS:  Appendicitis  POST-OPERATIVE DIAGNOSIS:  Appendicitis  PROCEDURE:  Procedure(s): APPENDECTOMY LAPAROSCOPIC (N/A)  SURGEON:  Surgeon(s) and Role:    * Jovita Kussmaul, MD - Primary  PHYSICIAN ASSISTANT:   ASSISTANTS: none   ANESTHESIA:   general  EBL:  Total I/O In: 1000 [I.V.:1000] Out: 400 [Urine:400]  BLOOD ADMINISTERED:none  DRAINS: none   LOCAL MEDICATIONS USED:  MARCAINE     SPECIMEN:  Source of Specimen:  appendix  DISPOSITION OF SPECIMEN:  PATHOLOGY  COUNTS:  YES  TOURNIQUET:  * No tourniquets in log *  DICTATION: .Dragon Dictation After informed consent was obtained patient was brought to the operating room placed in the supine position on the operating room table. After adequate induction of general anesthesia the patient's abdomen was prepped with ChloraPrep, allowed to dry, and draped in usual sterile manner. The area below the umbilicus was infiltrated with quarter percent Marcaine. A small incision was made with a 15 blade knife. This incision was carried down through the subcutaneous tissue bluntly with a hemostat and Army-Nav[employer] retractors until the linea alba was identified. The linea alba was incised with a 15 blade knife. Each side was grasped Coker clamps and elevated anteriorly. The preperitoneal space was probed bluntly with a hemostat until the peritoneum was opened and access was gained to the abdominal cavity. A 0 Vicryl purse string stitch was placed in the fascia surrounding the opening. A Hassan cannula was placed through the opening and anchored in place with the previously placed Vicryl purse string stitch. The laparoscope was placed through the [facility] cannula. The abdomen was insufflated with carbon dioxide without difficulty. Next the suprapubic area was infiltrated with quarter percent Marcaine. A small incision was made with a 15 blade knife. A 5  mm port was placed bluntly through this incision into the abdominal cavity. A site was then chosen between the 2 port for placement of a 5 mm port. The area was infiltrated with quarter percent Marcaine. A small stab incision was made with a 15 blade knife. A 5 mm port was placed bluntly through this incision and the abdominal cavity under direct vision. The laparoscope was then moved to the suprapubic port. Using a Glassman grasper and harmonic scalpel the right lower quadrant was inspected. The appendix was readily identified. The appendix was elevated anteriorly and the mesoappendix was taken down sharply with the harmonic scalpel. Once the base of the appendix where it joined the cecum was identified and cleared of any tissue then a laparoscopic GIA blue load 6 row stapler was placed through the [facility] cannula. The stapler was placed across the base of the appendix clamped and fired thereby dividing the base of the appendix between staple lines. A laparoscopic bag was then inserted through the [facility] cannula. The appendix was placed within the bag and the bag was sealed. The abdomen was then irrigated with copious amounts of saline until the effluent was clear. No other abnormalities were noted. The appendix and bag were removed with the [facility] cannula through the infraumbilical port without difficulty. The fascial defect was closed with the previously placed Vicryl pursestring stitch as well as with another interrupted 0 Vicryl figure-of-eight stitch. The rest of the ports were removed under direct vision and were found to be hemostatic. The gas was allowed to escape. The skin incisions were closed with interrupted 4-0 Monocryl subcuticular stitches. Dermabond dressings  were applied. The patient tolerated the procedure well. At the end of the case all needle sponge and instrument counts were correct. The patient was then awakened and taken to recovery in stable condition.  PLAN OF CARE: Admit to inpatient    PATIENT DISPOSITION:  PACU - hemodynamically stable.   Delay start of Pharmacological VTE agent (>24hrs) due to surgical blood loss or risk of bleeding: no

## 2014-01-21 NOTE — Interval H&P Note (Signed)
History and Physical Interval Note:  01/21/2014 8:28 AM  William Avila  has presented today for surgery, with the diagnosis of Appendicitis  The various methods of treatment have been discussed with the patient and family. After consideration of risks, benefits and other options for treatment, the patient has consented to  Procedure(s): APPENDECTOMY LAPAROSCOPIC (N/A) as a surgical intervention .  The patient's history has been reviewed, patient examined, no change in status, stable for surgery.  I have reviewed the patient's chart and labs.  Questions were answered to the patient's satisfaction.     TOTH III,PAUL S

## 2014-01-21 NOTE — Anesthesia Procedure Notes (Signed)
Procedure Name: Intubation Date/Time: 01/21/2014 8:49 AM Performed by: Maeola Harman Pre-anesthesia Checklist: Emergency Drugs available, Patient identified, Suction available, Patient being monitored and Timeout performed Patient Re-evaluated:Patient Re-evaluated prior to inductionOxygen Delivery Method: Circle system utilized Preoxygenation: Pre-oxygenation with 100% oxygen Intubation Type: IV induction Ventilation: Mask ventilation without difficulty Laryngoscope Size: Mac and 3 Grade View: Grade II Tube type: Oral Tube size: 7.5 mm Number of attempts: 1 Airway Equipment and Method: Stylet Placement Confirmation: ETT inserted through vocal cords under direct vision,  positive ETCO2 and breath sounds checked- equal and bilateral Secured at: 22 cm Tube secured with: Tape Dental Injury: Teeth and Oropharynx as per pre-operative assessment

## 2014-01-21 NOTE — Anesthesia Postprocedure Evaluation (Signed)
  Anesthesia Post-op Note  Patient: William Avila  Procedure(s) Performed: Procedure(s): APPENDECTOMY LAPAROSCOPIC (N/A)  Patient Location: PACU  Anesthesia Type:General  Level of Consciousness: awake and alert   Airway and Oxygen Therapy: Patient Spontanous Breathing  Post-op Pain: mild  Post-op Assessment: Post-op Vital signs reviewed, Patient's Cardiovascular Status Stable and Respiratory Function Stable  Post-op Vital Signs: Reviewed  Filed Vitals:   01/21/14 1045  BP: 132/83  Pulse: 65  Temp: 37 C  Resp: 16    Complications: No apparent anesthesia complications

## 2014-01-22 MED ORDER — TRAMADOL HCL 50 MG PO TABS: 100.0000 mg | ORAL_TABLET | Freq: Four times a day (QID) | ORAL | Status: DC | PRN

## 2014-01-22 MED ORDER — METOPROLOL TARTRATE 1 MG/ML IV SOLN
5.0000 mg | Freq: Four times a day (QID) | INTRAVENOUS | Status: DC | PRN
  Administered 2014-01-22 – 2014-01-23 (×2): 5 mg via INTRAVENOUS
  Filled 2014-01-22 (×2): qty 5

## 2014-01-22 NOTE — Progress Notes (Signed)
1 Day Post-Op  Subjective: Pt nauseated.  No appetite  Objective: Vital signs in last 24 hours: Temp:  [98.1 F (36.7 C)-99.5 F (37.5 C)] 98.3 F (36.8 C) (10/04 0654) Pulse Rate:  [58-75] 58 (10/04 0654) Resp:  [16-23] 16 (10/04 0654) BP: (127-162)/(80-96) 162/86 mmHg (10/04 0654) SpO2:  [92 %-97 %] 92 % (10/04 0654) Last BM Date: 01/20/14  Intake/Output from previous day: 10/03 0701 - 10/04 0700 In: 3527.5 [P.O.:1125; I.V.:2402.5] Out: 1630 [Urine:1230] Intake/Output this shift: Total I/O In: -  Out: 450 [Urine:450]  Incision/Wound:soft ND clean dry intact port sites.   Lab Results:   Recent Labs  01/21/14 0541  WBC 17.7*  HGB 14.0  HCT 39.5  PLT 230   BMET  Recent Labs  01/21/14 0541  NA 139  K 4.0  CL 103  CO2 23  GLUCOSE 134*  BUN 12  CREATININE 0.84  CALCIUM 8.8   PT/INR No results found for this basename: LABPROT, INR,  in the last 72 hours ABG No results found for this basename: PHART, PCO2, PO2, HCO3,  in the last 72 hours  Studies/Results: No results found.  Anti-infectives: Anti-infectives   Start     Dose/Rate Route Frequency Ordered Stop   01/21/14 0000  piperacillin-tazobactam (ZOSYN) IVPB 3.375 g     3.375 g 12.5 mL/hr over 240 Minutes Intravenous Every 8 hours 01/20/14 2338        Assessment/Plan: s/p Procedure(s): APPENDECTOMY LAPAROSCOPIC (N/A) Ambulate Leave on clears Try tramadol for pain   LOS: 2 days    Walta [place] A. 01/22/2014

## 2014-01-23 ENCOUNTER — Encounter (HOSPITAL_COMMUNITY): Payer: Self-pay | Admitting: General Surgery

## 2014-01-23 MED ORDER — TRAMADOL HCL 50 MG PO TABS: 100.0000 mg | ORAL_TABLET | Freq: Four times a day (QID) | ORAL | Status: DC | PRN

## 2014-01-23 MED ORDER — AMOXICILLIN-POT CLAVULANATE 875-125 MG PO TABS: 1.0000 | ORAL_TABLET | Freq: Two times a day (BID) | ORAL | Status: DC

## 2014-01-23 MED ORDER — SODIUM CHLORIDE 0.9 % IJ SOLN: 3.0000 mL | Freq: Two times a day (BID) | INTRAMUSCULAR | Status: DC

## 2014-01-23 MED ORDER — ACETAMINOPHEN 325 MG PO TABS: 650.0000 mg | ORAL_TABLET | Freq: Four times a day (QID) | ORAL | Status: DC | PRN

## 2014-01-23 MED ORDER — SODIUM CHLORIDE 0.9 % IV SOLN: 250.0000 mL | INTRAVENOUS | Status: DC | PRN

## 2014-01-23 MED ORDER — SODIUM CHLORIDE 0.9 % IJ SOLN: 3.0000 mL | INTRAMUSCULAR | Status: DC | PRN

## 2014-01-23 NOTE — Discharge Summary (Signed)
  Physician Discharge Summary  Patient ID: William Avila MRN: [id number] DOB/AGE: February 28, 1946 68 y.o.  Admit date: 01/20/2014 Discharge date: 01/23/2014  Admitting Diagnosis: Acute appendicitis   Discharge Diagnosis Patient Active Problem List   Diagnosis Date Noted  . Acute appendicitis 01/21/2014  . Appendicitis 01/20/2014    Consultants none  Imaging: No results found.  Procedures Laparoscopic appendectomy---Dr. Tobe Sos Course:   William Avila is a healthy male who presented to [facility] with abdominal pain.  Workup showed acute appendicitis.  Patient was admitted and underwent procedure listed above.  Tolerated procedure well and was transferred to the floor.  On POD#1 he was nauseated and therefore kept overnight.  He otherwise remained stable. Diet was advanced as tolerated.  On POD#2, the patient was voiding well, tolerating diet, ambulating well, pain well controlled, vital signs stable, incisions c/d/i and felt stable for discharge home with 5 days of antibiotics.  Medication risks, benefits and therapeutic alternatives were reviewed with the patient.  She/he verbalizes understanding.  Patient will follow up in our office in 3 weeks and knows to call with questions or concerns.      Medication List         acetaminophen 325 MG tablet  Commonly known as:  TYLENOL  Take 2 tablets (650 mg total) by mouth every 6 (six) hours as needed for mild pain (or Temp > 100).     amoxicillin-clavulanate 875-125 MG per tablet  Commonly known as:  AUGMENTIN  Take 1 tablet by mouth 2 (two) times daily.     multivitamin with minerals tablet  Take 1 tablet by mouth daily.     traMADol 50 MG tablet  Commonly known as:  ULTRAM  Take 2 tablets (100 mg total) by mouth every 6 (six) hours as needed for moderate pain.             Follow-up Information   Follow up with [facility] On 02/14/2014. (arrive by 1PM for a 1:30PM post op check)    Contact  information:   [address]   [place] [phone]       Signed: Erby Pian, [facility] [phone]  01/23/2014, 1:34 PM

## 2014-01-23 NOTE — Discharge Instructions (Signed)

## 2014-01-23 NOTE — Progress Notes (Signed)
Discharge home. Home discharge instruction given . No questions verbalized.

## 2014-01-23 NOTE — Progress Notes (Signed)
Patient ID: William Avila, male   DOB: 1945-05-12, 68 y.o.   MRN: [id number]     [facility]      [address] [place], [place] [address]    Phone: [phone] FAX: [phone]     Subjective: bp bit high, no home meds.  No sob.  Nausea has resolved.  Doesn't like the smell of clears, but able to tolerate passing flatus, voiding.  VSS.  Afebrile.   Objective:  Vital signs:  Filed Vitals:   01/22/14 1405 01/22/14 1537 01/22/14 2124 01/23/14 0501  BP: 160/88 154/96 181/108 171/103  Pulse:  55 56 58  Temp:   98.4 F (36.9 C) 98.5 F (36.9 C)  TempSrc:   Oral Oral  Resp:   18 17  Height:      Weight:      SpO2:   96% 97%    Last BM Date: 01/20/14  Intake/Output   Yesterday:  10/04 0701 - 10/05 0700 In: 2616 [P.O.:810; I.V.:1806] Out: 3850 [Urine:3850] This shift:    I/O last 3 completed shifts: In: 3863.5 [P.O.:1055; I.V.:2808.5] Out: 9163 [WGYKZ:9935; Other:400]    Physical Exam: General: Pt awake/alert/oriented x4 in no acute distress Abdomen: Soft.  Nondistended.  Appropriately tender.  Umbilical incision with minimal erythema, remainder 2 without any erythema or drainage.  No evidence of peritonitis.  No incarcerated hernias.   Problem List:   Active Problems:   Appendicitis   Acute appendicitis    Results:   Labs: No results found for this or any previous visit (from the past 16 hour(s)).  Imaging / Studies: No results found.  Medications / Allergies:  Scheduled Meds: . heparin subcutaneous  5,000 Units Subcutaneous 3 times per day  . pantoprazole (PROTONIX) IV  40 mg Intravenous QHS  . piperacillin-tazobactam (ZOSYN)  IV  3.375 g Intravenous Q8H  . sodium chloride  3 mL Intravenous Q12H   Continuous Infusions: . dextrose 5 % and 0.9 % NaCl with KCl 20 mEq/L 75 mL/hr at 01/23/14 0457   PRN Meds:.sodium chloride, acetaminophen, acetaminophen, fentaNYL, metoprolol, ondansetron, ondansetron  (ZOFRAN) IV, ondansetron, sodium chloride, traMADol  Antibiotics: Anti-infectives   Start     Dose/Rate Route Frequency Ordered Stop   01/21/14 0000  piperacillin-tazobactam (ZOSYN) IVPB 3.375 g     3.375 g 12.5 mL/hr over 240 Minutes Intravenous Every 8 hours 01/20/14 2338          Assessment/Plan POD#2 laparoscopic appendectomy---Dr. Marlou Starks -advance to a soft diet -give PO pain meds -SLIV -mobilize -SCD/lovenox -anticipate DC today with 5 days of augmentin  Erby Pian, ANP-BC [facility] Pager [phone](7A-4:30P) For consults and floor pages call [phone](7A-4:30P)  01/23/2014 8:15 AM

## 2014-01-26 ENCOUNTER — Telehealth (INDEPENDENT_AMBULATORY_CARE_PROVIDER_SITE_OTHER): Payer: Self-pay

## 2014-01-26 NOTE — Telephone Encounter (Signed)
Stop augmentin 

## 2014-01-26 NOTE — Telephone Encounter (Signed)
Informed pt to stop taking his Augmentin. Advised pt to give [place] a call back if rash doesn't get any better or becomes worse. Pt verbalized understanding

## 2014-01-26 NOTE — Telephone Encounter (Signed)
Pt called to report a rash on the chest. He is s/p lap appy 10/03.  He has had rash on arm where IV was for several days.  Yesterday, he developed a rash on the chest.  Today the rash is on the abdomen and chest.  There are a lot of red "bumps" in the middle of chest and abd.  They are not painful and are not itching now, but he took Benadryl last pm to sleep.  He is on Augmentin 875.  He was advised to take benadryl if he does not need to remain alert.  He can try OTC hydrocortisone cream to the rash.  We will inform Dr. Marlou Starks.  If he has other instructions, someone will call the patient back. Pt verbalized understanding. SL

## 2014-10-16 ENCOUNTER — Other Ambulatory Visit: Payer: Self-pay

## 2015-02-11 ENCOUNTER — Encounter (HOSPITAL_COMMUNITY): Payer: Self-pay | Admitting: Emergency Medicine

## 2015-02-11 ENCOUNTER — Emergency Department (HOSPITAL_COMMUNITY): Payer: Medicare Other

## 2015-02-11 ENCOUNTER — Emergency Department (HOSPITAL_COMMUNITY)
Admission: EM | Admit: 2015-02-11 | Discharge: 2015-02-12 | Disposition: A | Discharge status: Home / Self Care | Payer: Medicare Other | Attending: Emergency Medicine | Admitting: Emergency Medicine

## 2015-02-11 DIAGNOSIS — Y9301 Activity, walking, marching and hiking: Secondary | ICD-10-CM | POA: Diagnosis not present

## 2015-02-11 DIAGNOSIS — R030 Elevated blood-pressure reading, without diagnosis of hypertension: Secondary | ICD-10-CM | POA: Diagnosis not present

## 2015-02-11 DIAGNOSIS — Z792 Long term (current) use of antibiotics: Secondary | ICD-10-CM | POA: Diagnosis not present

## 2015-02-11 DIAGNOSIS — Y998 Other external cause status: Secondary | ICD-10-CM | POA: Diagnosis not present

## 2015-02-11 DIAGNOSIS — W010XXA Fall on same level from slipping, tripping and stumbling without subsequent striking against object, initial encounter: Secondary | ICD-10-CM | POA: Diagnosis not present

## 2015-02-11 DIAGNOSIS — Y9289 Other specified places as the place of occurrence of the external cause: Secondary | ICD-10-CM | POA: Insufficient documentation

## 2015-02-11 DIAGNOSIS — S59292A Other physeal fracture of lower end of radius, left arm, initial encounter for closed fracture: Secondary | ICD-10-CM | POA: Insufficient documentation

## 2015-02-11 DIAGNOSIS — S6992XA Unspecified injury of left wrist, hand and finger(s), initial encounter: Secondary | ICD-10-CM | POA: Diagnosis present

## 2015-02-11 DIAGNOSIS — Z79899 Other long term (current) drug therapy: Secondary | ICD-10-CM | POA: Diagnosis not present

## 2015-02-11 DIAGNOSIS — IMO0001 Reserved for inherently not codable concepts without codable children: Secondary | ICD-10-CM

## 2015-02-11 DIAGNOSIS — S62102A Fracture of unspecified carpal bone, left wrist, initial encounter for closed fracture: Secondary | ICD-10-CM

## 2015-02-11 DIAGNOSIS — Z87891 Personal history of nicotine dependence: Secondary | ICD-10-CM | POA: Diagnosis not present

## 2015-02-11 MED ORDER — HYDROCODONE-ACETAMINOPHEN 5-325 MG PO TABS: 1.0000 | ORAL_TABLET | ORAL | Status: DC | PRN

## 2015-02-11 MED ORDER — HYDROCODONE-ACETAMINOPHEN 5-325 MG PO TABS
1.0000 | ORAL_TABLET | Freq: Once | ORAL | Status: AC
  Administered 2015-02-11: 1 via ORAL
  Filled 2015-02-11: qty 1

## 2015-02-11 NOTE — Discharge Instructions (Signed)
Wrist Fracture A wrist fracture is a break or crack in one of the bones of your wrist. Your wrist is made up of eight small bones at the palm of your hand (carpal bones) and two long bones that make up your forearm (radius and ulna). CAUSES  A direct blow to the wrist.  Falling on an outstretched hand.  Trauma, such as a car accident or a fall. RISK FACTORS Risk factors for wrist fracture include:  Participating in contact and high-risk sports, such as skiing, biking, and ice skating.  Taking steroid medicines.  Smoking.  Being male.  Being Caucasian.  Drinking more than three alcoholic beverages per day.  Having low or lowered bone density (osteoporosis or osteopenia).  Age. Older adults have decreased bone density.  Women who have had menopause.  History of previous fractures. SIGNS AND SYMPTOMS Symptoms of wrist fractures include tenderness, bruising, and inflammation. Additionally, the wrist may hang in an odd position or appear deformed. DIAGNOSIS Diagnosis may include:  Physical exam.  X-ray. TREATMENT Treatment depends on many factors, including the nature and location of the fracture, your age, and your activity level. Treatment for wrist fracture can be nonsurgical or surgical. Nonsurgical Treatment A plaster cast or splint may be applied to your wrist if the bone is in a good position. If the fracture is not in good position, it may be necessary for your health care provider to realign it before applying a splint or cast. Usually, a cast or splint will be worn for several weeks. Surgical Treatment Sometimes the position of the bone is so far out of place that surgery is required to apply a device to hold it together as it heals. Depending on the fracture, there are a number of options for holding the bone in place while it heals, such as a cast and metal pins. HOME CARE INSTRUCTIONS  Keep your injured wrist elevated and move your fingers as much as  possible.  Do not put pressure on any part of your cast or splint. It may break.  Use a plastic bag to protect your cast or splint from water while bathing or showering. Do not lower your cast or splint into water.  Take medicines only as directed by your health care provider.  Keep your cast or splint clean and dry. If it becomes wet, damaged, or suddenly feels too tight, contact your health care provider right away.  Do not use any tobacco products including cigarettes, chewing tobacco, or electronic cigarettes. Tobacco can delay bone healing. If you need help quitting, ask your health care provider.  Keep all follow-up visits as directed by your health care provider. This is important.  Ask your health care provider if you should take supplements of calcium and vitamins C and D to promote bone healing. SEEK MEDICAL CARE IF:  Your cast or splint is damaged, breaks, or gets wet.  You have a fever.  You have chills.  You have continued severe pain or more swelling than you did before the cast was put on. SEEK IMMEDIATE MEDICAL CARE IF:  Your hand or fingernails on the injured arm turn blue or gray, or feel cold or numb.  You have decreased feeling in the fingers of your injured arm. MAKE SURE YOU:  Understand these instructions.  Will watch your condition.  Will get help right away if you are not doing well or get worse.   This information is not intended to replace advice given to you by  your health care provider. Make sure you discuss any questions you have with your health care provider.   Document Released: 01/15/2005 Document Revised: 12/27/2014 Document Reviewed: 04/25/2011 Elsevier Interactive Patient Education 2016 [employer].   Hypertension Hypertension, commonly called high blood pressure, is when the force of blood pumping through your arteries is too strong. Your arteries are the blood vessels that carry blood from your heart throughout your body. A blood  pressure reading consists of a higher number over a lower number, such as 110/72. The higher number (systolic) is the pressure inside your arteries when your heart pumps. The lower number (diastolic) is the pressure inside your arteries when your heart relaxes. Ideally you want your blood pressure below 120/80. Hypertension forces your heart to work harder to pump blood. Your arteries may become narrow or stiff. Having untreated or uncontrolled hypertension can cause heart attack, stroke, kidney disease, and other problems. RISK FACTORS Some risk factors for high blood pressure are controllable. Others are not.  Risk factors you cannot control include:   Race. You may be at higher risk if you are African American.  Age. Risk increases with age.  Gender. Men are at higher risk than women before age 50 years. After age 91, women are at higher risk than men. Risk factors you can control include:  Not getting enough exercise or physical activity.  Being overweight.  Getting too much fat, sugar, calories, or salt in your diet.  Drinking too much alcohol. SIGNS AND SYMPTOMS Hypertension does not usually cause signs or symptoms. Extremely high blood pressure (hypertensive crisis) may cause headache, anxiety, shortness of breath, and nosebleed. DIAGNOSIS To check if you have hypertension, your health care provider will measure your blood pressure while you are seated, with your arm held at the level of your heart. It should be measured at least twice using the same arm. Certain conditions can cause a difference in blood pressure between your right and left arms. A blood pressure reading that is higher than normal on one occasion does not mean that you need treatment. If it is not clear whether you have high blood pressure, you may be asked to return on a different day to have your blood pressure checked again. Or, you may be asked to monitor your blood pressure at home for 1 or more  weeks. TREATMENT Treating high blood pressure includes making lifestyle changes and possibly taking medicine. Living a healthy lifestyle can help lower high blood pressure. You may need to change some of your habits. Lifestyle changes may include:  Following the DASH diet. This diet is high in fruits, vegetables, and whole grains. It is low in salt, red meat, and added sugars.  Keep your sodium intake below 2,300 mg per day.  Getting at least 30-45 minutes of aerobic exercise at least 4 times per week.  Losing weight if necessary.  Not smoking.  Limiting alcoholic beverages.  Learning ways to reduce stress. Your health care provider may prescribe medicine if lifestyle changes are not enough to get your blood pressure under control, and if one of the following is true:  You are 65-74 years of age and your systolic blood pressure is above 140.  You are 63 years of age or older, and your systolic blood pressure is above 150.  Your diastolic blood pressure is above 90.  You have diabetes, and your systolic blood pressure is over 182 or your diastolic blood pressure is over 90.  You have kidney disease and  your blood pressure is above 140/90.  You have heart disease and your blood pressure is above 140/90. Your personal target blood pressure may vary depending on your medical conditions, your age, and other factors. HOME CARE INSTRUCTIONS  Have your blood pressure rechecked as directed by your health care provider.   Take medicines only as directed by your health care provider. Follow the directions carefully. Blood pressure medicines must be taken as prescribed. The medicine does not work as well when you skip doses. Skipping doses also puts you at risk for problems.  Do not smoke.   Monitor your blood pressure at home as directed by your health care provider. SEEK MEDICAL CARE IF:   You think you are having a reaction to medicines taken.  You have recurrent headaches or  feel dizzy.  You have swelling in your ankles.  You have trouble with your vision. SEEK IMMEDIATE MEDICAL CARE IF:  You develop a severe headache or confusion.  You have unusual weakness, numbness, or feel faint.  You have severe chest or abdominal pain.  You vomit repeatedly.  You have trouble breathing. MAKE SURE YOU:   Understand these instructions.  Will watch your condition.  Will get help right away if you are not doing well or get worse.   This information is not intended to replace advice given to you by your health care provider. Make sure you discuss any questions you have with your health care provider.   Document Released: 04/07/2005 Document Revised: 08/22/2014 Document Reviewed: 01/28/2013 Elsevier Interactive Patient Education 2016 [employer] Inc[employer]   Use ice and elevation to help minimize pain and swelling.  Wear the wrist splint .  You may take the hydrocodone prescribed for pain relief.  This will make you drowsy - do not drive within 4 hours of taking this medication.  Call Dr. Lenon Curt for further evaluation of your wrist injury.  Call your primary doctor for a recheck of your blood pressure within the next week.  Your first blood pressure was 190/117 and when it was rechecked was still very high at 168/114.

## 2015-02-11 NOTE — ED Provider Notes (Signed)
Patient fell on outstretched arm tonight 7 PM injuring his left wrist. No other injury. On exam patient is alert, Glasgow coma score 15 left upper extremity skin intact with swelling and tenderness dorsum of wrist and at anatomic snuffbox. Radial pulse 2+ good capillary refill. X-rays reviewed by me. The volar forearm splint was placed by the nurse which was adjusted by me. He is comfortable in splint at discharge.   Orlie Dakin, MD 02/11/15 2352

## 2015-02-11 NOTE — ED Notes (Signed)
Pt states he went outside and slipped on wet grass, caught self with left hand.  Having pain and swelling.  Took Aleve

## 2015-03-06 NOTE — ED Provider Notes (Signed)
CSN: [id number]     Arrival date & time 02/11/15  2040 History   First MD Initiated Contact with Patient 02/11/15 2100     Chief Complaint  Patient presents with  . Hand Injury     (Consider location/radiation/quality/duration/timing/severity/associated sxs/prior Treatment) Patient is a 69 y.o. male presenting with hand injury. The history is provided by the patient.  Hand Injury Location:  Wrist Time since incident:  2 hours Injury: yes   Mechanism of injury: fall   Mechanism of injury comment:  Slipped on wet grass walking down a hill. Fall:    Height of fall:  Standing fall   Impact surface:  Grass   Point of impact:  Outstretched arms   Entrapped after fall: no   Wrist location:  L wrist Pain details:    Quality:  Aching and sharp   Radiates to:  Does not radiate   Severity:  Moderate   Onset quality:  Sudden   Duration:  2 hours   Timing:  Constant   Progression:  Unchanged Chronicity:  New Handedness:  Right-handed Dislocation: no   Foreign body present:  No foreign bodies Tetanus status:  Up to date Prior injury to area:  No Relieved by:  NSAIDs (took 2 aleve before arrival with mild improvement) Worsened by:  Movement Ineffective treatments:  None tried Associated symptoms: swelling   Associated symptoms: no back pain, no fever and no numbness   Risk factors: no frequent fractures     History reviewed. No pertinent past medical history. Past Surgical History  Procedure Laterality Date  . Back surgery    . Laparoscopic appendectomy N/A 01/21/2014    Procedure: APPENDECTOMY LAPAROSCOPIC;  Surgeon: Autumn Messing III, MD;  Location: [facility] [place];  Service: General;  Laterality: N/A;   History reviewed. No pertinent family history. Social History  Substance Use Topics  . Smoking status: Former Smoker    Types: Cigarettes  . Smokeless tobacco: None  . Alcohol Use: Yes     Comment: Occassionally    Review of Systems  Constitutional: Negative for fever.   Musculoskeletal: Positive for joint swelling and arthralgias. Negative for myalgias and back pain.  Skin: Negative.   Neurological: Negative for weakness and numbness.      Allergies  Codeine; Ibuprofen; and Robaxin  Home Medications   Prior to Admission medications   Medication Sig Start Date End Date Taking? Authorizing Provider  acetaminophen (TYLENOL) 500 MG tablet Take 500 mg by mouth every 6 (six) hours as needed for mild pain.   Yes Historical Provider, MD  Multiple Vitamins-Minerals (MULTIVITAMIN WITH MINERALS) tablet Take 1 tablet by mouth daily.   Yes Historical Provider, MD  naproxen sodium (ALEVE) 220 MG tablet Take 440 mg by mouth 2 (two) times daily as needed (pain).   Yes Historical Provider, MD  Polyethyl Glycol-Propyl Glycol (SYSTANE OP) Place 1 drop into both eyes daily as needed (Dry Eyes).   Yes Historical Provider, MD  acetaminophen (TYLENOL) 325 MG tablet Take 2 tablets (650 mg total) by mouth every 6 (six) hours as needed for mild pain (or Temp > 100). 01/23/14   Emina Riebock, NP  amoxicillin-clavulanate (AUGMENTIN) 875-125 MG per tablet Take 1 tablet by mouth 2 (two) times daily. 01/23/14   Emina Riebock, NP  HYDROcodone-acetaminophen (NORCO/VICODIN) 5-325 MG tablet Take 1 tablet by mouth every 4 (four) hours as needed. 02/11/15   Evalee Jefferson, PA-C  traMADol (ULTRAM) 50 MG tablet Take 2 tablets (100 mg total) by mouth every 6 (  six) hours as needed for moderate pain. 01/23/14   Emina Riebock, NP   BP 168/114 mmHg  Pulse 62  Temp(Src) 98.9 F (37.2 C) (Oral)  Resp 17  Ht 6' (1.829 m)  Wt 180 lb (81.647 kg)  BMI 24.41 kg/m2  SpO2 98% Physical Exam  Constitutional: He appears well-developed and well-nourished.  bp elevated, rechecked, improved, still elevated.  HENT:  Head: Atraumatic.  Neck: Normal range of motion.  Cardiovascular:  Pulses:      Radial pulses are 2+ on the right side, and 2+ on the left side.  Pulses equal bilaterally. Less than 2 sec  fingertip cap refill.  Musculoskeletal: He exhibits tenderness.       Left wrist: He exhibits bony tenderness and swelling. He exhibits no crepitus and no laceration.  ttp dorsal wrist and at scaphoid.  Neurological: He is alert. He has normal strength. He displays normal reflexes. No sensory deficit.  Skin: Skin is warm and dry.  Psychiatric: He has a normal mood and affect.    ED Course  Procedures (including critical care time) Labs Review Labs Reviewed - No data to display  Imaging Review ADDENDUM REPORT: 02/11/2015 23:03 ADDENDUM: Upon re- review, there is slight cortical irregularity of the distal radial metaphysis, concerning for nondisplaced fracture in the setting of acute trauma. Electronically Signed  By: Elon Alas M.D.  On: 02/11/2015 23:03       Final result by Rad Results In Interface (02/11/15 22:36:13)   Narrative:   CLINICAL DATA: Slipped and fell on wet grass , wrist injury.  EXAM: LEFT WRIST - COMPLETE 3+ VIEW  COMPARISON: None.  FINDINGS: No acute fracture deformity or dislocation. Mild first metacarpal phalangeal osteoarthrosis. Joint space intact without erosions. No destructive bony lesions. Mild dorsal hand soft tissue swelling without subcutaneous gas or radiopaque foreign bodies.  IMPRESSION: Dorsal wrist soft tissue swelling. No acute fracture deformity or dislocation.   Electronically Signed By: Elon Alas M.D. On: 02/11/2015 22:36          DG Hand Complete Left (Final result) Result time: 02/11/15 21:20:16   Final result by Rad Results In Interface (02/11/15 21:20:16)   Narrative:   CLINICAL DATA: Acute left hand pain and swelling after slipping. Initial encounter.  EXAM: LEFT HAND - COMPLETE 3+ VIEW  COMPARISON: None.  FINDINGS: There is no evidence of fracture or dislocation. Mild osteoarthritis of the first carpometacarpal joint is noted. Soft tissues  are unremarkable.  IMPRESSION: Mild osteoarthritis of the first carpometacarpal joint. No acute abnormality seen in the left hand.   Electronically Signed By: Marijo Conception, M.D. On: 02/11/2015 21:20      I have personally reviewed and evaluated these images as part of my medical decision-making.   EKG Interpretation None      MDM   Final diagnoses:  Elevated blood pressure  Wrist fracture, closed, left, initial encounter    Pt placed in wrist splint, sling provided.  RICE, hydrocodone, referral to Dr. Lenon Curt for further management of injury.  Also discussed f/u with pcp for recheck of bp within 1 week.  The patient appears reasonably screened and/or stabilized for discharge and I doubt any other medical condition or other [facility] requiring further screening, evaluation, or treatment in the ED at this time prior to discharge.  Pt was seen by Dr. Winfred Leeds during this visit.     Evalee Jefferson, PA-C 03/06/15 [id number]  Orlie Dakin, MD 03/07/15 [phone]

## 2015-05-10 DIAGNOSIS — Z1389 Encounter for screening for other disorder: Secondary | ICD-10-CM | POA: Diagnosis not present

## 2015-05-10 DIAGNOSIS — M6530 Trigger finger, unspecified finger: Secondary | ICD-10-CM | POA: Diagnosis not present

## 2015-05-10 DIAGNOSIS — R030 Elevated blood-pressure reading, without diagnosis of hypertension: Secondary | ICD-10-CM | POA: Diagnosis not present

## 2015-06-19 DIAGNOSIS — M65341 Trigger finger, right ring finger: Secondary | ICD-10-CM | POA: Diagnosis not present

## 2015-08-16 DIAGNOSIS — M65312 Trigger thumb, left thumb: Secondary | ICD-10-CM | POA: Diagnosis not present

## 2015-10-30 DIAGNOSIS — M65341 Trigger finger, right ring finger: Secondary | ICD-10-CM | POA: Diagnosis not present

## 2015-11-21 DIAGNOSIS — M65312 Trigger thumb, left thumb: Secondary | ICD-10-CM | POA: Diagnosis not present

## 2015-11-21 DIAGNOSIS — M65341 Trigger finger, right ring finger: Secondary | ICD-10-CM | POA: Diagnosis not present

## 2015-12-13 DIAGNOSIS — M65331 Trigger finger, right middle finger: Secondary | ICD-10-CM | POA: Diagnosis not present

## 2015-12-13 DIAGNOSIS — M65341 Trigger finger, right ring finger: Secondary | ICD-10-CM | POA: Diagnosis not present

## 2015-12-22 ENCOUNTER — Emergency Department (HOSPITAL_COMMUNITY): Payer: Medicare HMO

## 2015-12-22 ENCOUNTER — Emergency Department (HOSPITAL_COMMUNITY)
Admission: EM | Admit: 2015-12-22 | Discharge: 2015-12-22 | Disposition: A | Discharge status: Home / Self Care | Payer: Medicare HMO | Source: Home / Self Care | Attending: Emergency Medicine | Admitting: Emergency Medicine

## 2015-12-22 ENCOUNTER — Encounter (HOSPITAL_COMMUNITY): Payer: Self-pay | Admitting: Emergency Medicine

## 2015-12-22 DIAGNOSIS — R739 Hyperglycemia, unspecified: Secondary | ICD-10-CM | POA: Diagnosis not present

## 2015-12-22 DIAGNOSIS — K440 Diaphragmatic hernia with obstruction, without gangrene: Secondary | ICD-10-CM | POA: Diagnosis not present

## 2015-12-22 DIAGNOSIS — Z791 Long term (current) use of non-steroidal anti-inflammatories (NSAID): Secondary | ICD-10-CM

## 2015-12-22 DIAGNOSIS — Z79899 Other long term (current) drug therapy: Secondary | ICD-10-CM

## 2015-12-22 DIAGNOSIS — R079 Chest pain, unspecified: Secondary | ICD-10-CM | POA: Diagnosis not present

## 2015-12-22 DIAGNOSIS — R1900 Intra-abdominal and pelvic swelling, mass and lump, unspecified site: Secondary | ICD-10-CM | POA: Insufficient documentation

## 2015-12-22 DIAGNOSIS — R1110 Vomiting, unspecified: Secondary | ICD-10-CM | POA: Diagnosis not present

## 2015-12-22 DIAGNOSIS — Z87891 Personal history of nicotine dependence: Secondary | ICD-10-CM

## 2015-12-22 DIAGNOSIS — R1013 Epigastric pain: Secondary | ICD-10-CM | POA: Diagnosis not present

## 2015-12-22 DIAGNOSIS — K449 Diaphragmatic hernia without obstruction or gangrene: Secondary | ICD-10-CM | POA: Insufficient documentation

## 2015-12-22 LAB — COMPREHENSIVE METABOLIC PANEL
ALT: 17 U/L (ref 17–63)
AST: 21 U/L (ref 15–41)
Albumin: 4.2 g/dL (ref 3.5–5.0)
Alkaline Phosphatase: 95 U/L (ref 38–126)
Anion gap: 12 (ref 5–15)
BUN: 13 mg/dL (ref 6–20)
CO2: 27 mmol/L (ref 22–32)
Calcium: 10 mg/dL (ref 8.9–10.3)
Chloride: 100 mmol/L — ABNORMAL LOW (ref 101–111)
Creatinine, Ser: 0.91 mg/dL (ref 0.61–1.24)
GFR calc Af Amer: >60 mL/min (ref >60)
GFR calc non Af Amer: >60 mL/min (ref >60)
Glucose, Bld: 127 mg/dL — ABNORMAL HIGH (ref 65–99)
Potassium: 3.5 mmol/L (ref 3.5–5.1)
Sodium: 139 mmol/L (ref 135–145)
Total Bilirubin: 0.8 mg/dL (ref 0.3–1.2)
Total Protein: 6.8 g/dL (ref 6.5–8.1)

## 2015-12-22 LAB — CBC WITH DIFFERENTIAL/PLATELET
Basophils Absolute: 0 10*3/uL (ref 0.0–0.1)
Basophils Relative: 0 %
Eosinophils Absolute: 0 10*3/uL (ref 0.0–0.7)
Eosinophils Relative: 0 %
HCT: 44.8 % (ref 39.0–52.0)
Hemoglobin: 15.7 g/dL (ref 13.0–17.0)
Lymphocytes Relative: 8 %
Lymphs Abs: 0.8 10*3/uL (ref 0.7–4.0)
MCH: 32.8 pg (ref 26.0–34.0)
MCHC: 35 g/dL (ref 30.0–36.0)
MCV: 93.5 fL (ref 78.0–100.0)
Monocytes Absolute: 0.5 10*3/uL (ref 0.1–1.0)
Monocytes Relative: 5 %
Neutro Abs: 9.8 10*3/uL — ABNORMAL HIGH (ref 1.7–7.7)
Neutrophils Relative %: 87 %
Platelets: 252 10*3/uL (ref 150–400)
RBC: 4.79 MIL/uL (ref 4.22–5.81)
RDW: 12.5 % (ref 11.5–15.5)
WBC: 11.2 10*3/uL — ABNORMAL HIGH (ref 4.0–10.5)

## 2015-12-22 LAB — I-STAT TROPONIN, ED: Troponin i, poc: 0 ng/mL (ref 0.00–0.08)

## 2015-12-22 LAB — LIPASE, BLOOD: Lipase: 76 U/L — ABNORMAL HIGH (ref 11–51)

## 2015-12-22 MED ORDER — ONDANSETRON HCL 4 MG/2ML IJ SOLN
4.0000 mg | Freq: Once | INTRAMUSCULAR | Status: AC
  Administered 2015-12-22: 4 mg via INTRAVENOUS
  Filled 2015-12-22: qty 2

## 2015-12-22 MED ORDER — GLUCAGON HCL RDNA (DIAGNOSTIC) 1 MG IJ SOLR
1.0000 mg | Freq: Once | INTRAMUSCULAR | Status: AC
  Administered 2015-12-22: 1 mg via INTRAVENOUS
  Filled 2015-12-22: qty 1

## 2015-12-22 MED ORDER — PANTOPRAZOLE SODIUM 40 MG PO TBEC
40.0000 mg | DELAYED_RELEASE_TABLET | Freq: Every day | ORAL | 0 refills | Status: DC
Start: 2015-12-22 — End: 2015-12-29

## 2015-12-22 MED ORDER — ONDANSETRON 8 MG PO TBDP: 8.0000 mg | ORAL_TABLET | Freq: Three times a day (TID) | ORAL | 0 refills | Status: DC | PRN

## 2015-12-22 MED ORDER — FENTANYL CITRATE (PF) 100 MCG/2ML IJ SOLN
INTRAMUSCULAR | Status: AC
  Administered 2015-12-22: 50 ug
  Filled 2015-12-22: qty 2

## 2015-12-22 MED ORDER — FENTANYL CITRATE (PF) 100 MCG/2ML IJ SOLN
50.0000 ug | Freq: Once | INTRAMUSCULAR | Status: AC
  Administered 2015-12-22: 50 ug via INTRAVENOUS
  Filled 2015-12-22: qty 2

## 2015-12-22 MED ORDER — FAMOTIDINE IN NACL 20-0.9 MG/50ML-% IV SOLN
20.0000 mg | Freq: Once | INTRAVENOUS | Status: AC
  Administered 2015-12-22: 20 mg via INTRAVENOUS
  Filled 2015-12-22: qty 50

## 2015-12-22 MED ORDER — IOPAMIDOL (ISOVUE-300) INJECTION 61%
INTRAVENOUS | Status: AC
  Administered 2015-12-22: 30 mL via ORAL
  Filled 2015-12-22: qty 30

## 2015-12-22 MED ORDER — IOPAMIDOL (ISOVUE-300) INJECTION 61%
100.0000 mL | Freq: Once | INTRAVENOUS | Status: AC | PRN
  Administered 2015-12-22: 30 mL via ORAL

## 2015-12-22 MED ORDER — SUCRALFATE 1 G PO TABS: 1.0000 g | ORAL_TABLET | Freq: Three times a day (TID) | ORAL | 0 refills | Status: DC

## 2015-12-22 MED ORDER — GI COCKTAIL ~~LOC~~
30.0000 mL | Freq: Once | ORAL | Status: AC
  Administered 2015-12-22: 30 mL via ORAL
  Filled 2015-12-22: qty 30

## 2015-12-22 MED ORDER — OXYCODONE-ACETAMINOPHEN 5-325 MG PO TABS: 1.0000 | ORAL_TABLET | ORAL | 0 refills | Status: DC | PRN

## 2015-12-22 NOTE — ED Triage Notes (Signed)
PT c/o epigastric pain and central chest pain that started yesterday while eating lunch. PT c/o burning sensation at this time and denies any SOB.

## 2015-12-22 NOTE — ED Notes (Signed)
William Avila at bedside. 

## 2015-12-22 NOTE — ED Notes (Signed)
MD at bedside. 

## 2015-12-22 NOTE — ED Notes (Signed)
Shortly after drinking GI cocktail and sips of gingerale, pt began having increased pain in his upper abdomen.   William Avila made aware.

## 2015-12-22 NOTE — Discharge Instructions (Signed)
It is important for you to follow up with Dr. Laurann Montana this week for a recheck of your symptoms and to discuss further treatment of your hiatal hernia.  Also, you need to discuss with him if you need for evaluation of the mass we discussed today which was also seen in 2015 when you had your appendectomy.  It's size today is 8.8 x 10.0 x 6.9 cm.  If it is unchanged in size from 2015 he may consider doing no further workup as it appears benign and does not appear to be the source of your symptoms.

## 2015-12-22 NOTE — ED Notes (Signed)
CP worsens with deep inspiration.

## 2015-12-22 NOTE — ED Notes (Signed)
Pt sleeping at this time.

## 2015-12-22 NOTE — ED Notes (Signed)
Pt given ginger ale to drink. 

## 2015-12-22 NOTE — ED Provider Notes (Signed)
[facility] DEPT Provider Note   CSN: [id number] Arrival date & time: 12/22/15  0934     History   Chief Complaint Chief Complaint  Patient presents with  . Chest Pain    HPI William Avila is a 70 y.o. male presenting with epigastric and mid sternal pain which he describes as intermittently burning and sharp which has been constant since yesterday noon.  He reports eating a few bites of stromoli for lunch yesterday when the symptoms started.  He reports multiple episodes of emesis since the event which initially has maybe included the stromboli but is unsure,now just dry heaving and bringing up volumes of saliva (denies vomiting stomach acids).  He does report a history of frequent similar transient dysphagia with meals.  He denies significant acid reflux or heartburn, however.  He denies radiation of pain into his back, neck or shoulders.  He has found no alleviators.  HPI  History reviewed. No pertinent past medical history.  Patient Active Problem List   Diagnosis Date Noted  . Acute appendicitis 01/21/2014  . Appendicitis 01/20/2014    Past Surgical History:  Procedure Laterality Date  . BACK SURGERY    . CERVICAL SPINE SURGERY    . LAPAROSCOPIC APPENDECTOMY N/A 01/21/2014   Procedure: APPENDECTOMY LAPAROSCOPIC;  Surgeon: Autumn Messing III, MD;  Location: [facility];  Service: General;  Laterality: N/A;  . TRIGGER FINGER RELEASE         Home Medications    Prior to Admission medications   Medication Sig Start Date End Date Taking? Authorizing Provider  acetaminophen (TYLENOL) 500 MG tablet Take 500 mg by mouth every 6 (six) hours as needed for mild pain.   Yes Historical Provider, MD  Multiple Vitamins-Minerals (MULTIVITAMIN WITH MINERALS) tablet Take 1 tablet by mouth daily.   Yes Historical Provider, MD  naproxen sodium (ALEVE) 220 MG tablet Take 440 mg by mouth 2 (two) times daily as needed (pain).   Yes Historical Provider, MD  ondansetron (ZOFRAN ODT) 8 MG  disintegrating tablet Take 1 tablet (8 mg total) by mouth every 8 (eight) hours as needed for nausea or vomiting. 12/22/15   Evalee Jefferson, PA-C  oxyCODONE-acetaminophen (PERCOCET/ROXICET) 5-325 MG tablet Take 1 tablet by mouth every 4 (four) hours as needed. 12/22/15   Evalee Jefferson, PA-C  pantoprazole (PROTONIX) 40 MG tablet Take 1 tablet (40 mg total) by mouth daily. 12/22/15   Evalee Jefferson, PA-C  sucralfate (CARAFATE) 1 g tablet Take 1 tablet (1 g total) by mouth 4 (four) times daily -  with meals and at bedtime. 12/22/15   Evalee Jefferson, PA-C    Family History History reviewed. No pertinent family history.  Social History Social History  Substance Use Topics  . Smoking status: Former Smoker    Types: Cigarettes  . Smokeless tobacco: Never Used  . Alcohol use Yes     Comment: Occassionally     Allergies   Codeine; Ibuprofen; and Robaxin [methocarbamol]   Review of Systems Review of Systems  Constitutional: Negative for fever.  HENT: Negative for congestion and sore throat.   Eyes: Negative.   Respiratory: Negative for chest tightness and shortness of breath.   Cardiovascular: Negative for chest pain.  Gastrointestinal: Positive for abdominal pain, nausea and vomiting.  Genitourinary: Negative.   Musculoskeletal: Negative for arthralgias, joint swelling and neck pain.  Skin: Negative.  Negative for rash and wound.  Neurological: Negative for dizziness, weakness, light-headedness, numbness and headaches.  Psychiatric/Behavioral: Negative.  Physical Exam Updated Vital Signs BP 139/93 (BP Location: Right Arm)   Pulse (!) 53   Temp 98.4 F (36.9 C) (Oral)   Resp 16   Ht 6' (1.829 m)   Wt 77.1 kg   SpO2 100%   BMI 23.06 kg/m   Physical Exam  Constitutional: He appears well-developed and well-nourished.  Appears uncomfortable,  Occasional wincing while holding upper abdomen  HENT:  Head: Normocephalic and atraumatic.  Eyes: Conjunctivae are normal.  Neck: Normal range of  motion.  Cardiovascular: Normal rate, regular rhythm, normal heart sounds and intact distal pulses.   Pulmonary/Chest: Effort normal and breath sounds normal. He has no wheezes.  Abdominal: Soft. Bowel sounds are normal. He exhibits no distension and no mass. There is tenderness in the epigastric area. There is no guarding.  Musculoskeletal: Normal range of motion.  Neurological: He is alert.  Skin: Skin is warm and dry.  Psychiatric: He has a normal mood and affect.  Nursing note and vitals reviewed.    ED Treatments / Results  Labs  Results for orders placed or performed during the hospital encounter of 12/22/15  Comprehensive metabolic panel  Result Value Ref Range   Sodium 139 135 - 145 mmol/L   Potassium 3.5 3.5 - 5.1 mmol/L   Chloride 100 (L) 101 - 111 mmol/L   CO2 27 22 - 32 mmol/L   Glucose, Bld 127 (H) 65 - 99 mg/dL   BUN 13 6 - 20 mg/dL   Creatinine, Ser 0.91 0.61 - 1.24 mg/dL   Calcium 10.0 8.9 - 10.3 mg/dL   Total Protein 6.8 6.5 - 8.1 g/dL   Albumin 4.2 3.5 - 5.0 g/dL   AST 21 15 - 41 U/L   ALT 17 17 - 63 U/L   Alkaline Phosphatase 95 38 - 126 U/L   Total Bilirubin 0.8 0.3 - 1.2 mg/dL   GFR calc non Af Amer >60 >60 mL/min   GFR calc Af Amer >60 >60 mL/min   Anion gap 12 5 - 15  CBC with Differential  Result Value Ref Range   WBC 11.2 (H) 4.0 - 10.5 K/uL   RBC 4.79 4.22 - 5.81 MIL/uL   Hemoglobin 15.7 13.0 - 17.0 g/dL   HCT 44.8 39.0 - 52.0 %   MCV 93.5 78.0 - 100.0 fL   MCH 32.8 26.0 - 34.0 pg   MCHC 35.0 30.0 - 36.0 g/dL   RDW 12.5 11.5 - 15.5 %   Platelets 252 150 - 400 K/uL   Neutrophils Relative % 87 %   Neutro Abs 9.8 (H) 1.7 - 7.7 K/uL   Lymphocytes Relative 8 %   Lymphs Abs 0.8 0.7 - 4.0 K/uL   Monocytes Relative 5 %   Monocytes Absolute 0.5 0.1 - 1.0 K/uL   Eosinophils Relative 0 %   Eosinophils Absolute 0.0 0.0 - 0.7 K/uL   Basophils Relative 0 %   Basophils Absolute 0.0 0.0 - 0.1 K/uL  Lipase, blood  Result Value Ref Range   Lipase 76  (H) 11 - 51 U/L  I-stat troponin, ED  Result Value Ref Range   Troponin i, poc 0.00 0.00 - 0.08 ng/mL   Comment 3           Dg Chest 2 View  Result Date: 12/22/2015 CLINICAL DATA:  Chest and upper abdomen pain starting yesterday EXAM: CHEST  2 VIEW COMPARISON:  May 08, 2007 FINDINGS: The heart size and mediastinal contours are within normal limits. There is  no focal infiltrate, pulmonary edema, or pleural effusion. The visualized skeletal structures are stable. There is a large hiatal hernia. IMPRESSION: No active cardiopulmonary disease. Electronically Signed   By: Abelardo Diesel M.D.   On: 12/22/2015 11:05   Ct Abdomen Pelvis W Contrast  Result Date: 12/22/2015 CLINICAL DATA:  Epigastric pain when trying to eat or drink. Symptoms began yesterday when he ate lunch. EXAM: CT ABDOMEN AND PELVIS WITH CONTRAST TECHNIQUE: Multidetector CT imaging of the abdomen and pelvis was performed using the standard protocol following bolus administration of intravenous contrast. CONTRAST:  100 cc Isovue-300 COMPARISON:  Lumbar spine films 08/30/2010, chest x-ray 12/22/2015 FINDINGS: Lower chest: There is small amount of scarring at both lung bases. Heart size is normal. Minimal atherosclerosis at the level of the aortic valve. There is a large hiatal hernia. Hepatobiliary: Posterior to liver, superior to the right kidney, and lateral to the adrenal gland, there is a hyperdense calcified mass or collection contrast which measures 8.8 x 10.0 x 6.9 cm. Correlation with history is recommended. Has the patient had interventional procedures performed in the right upper quadrant? Pancreas: Normal in appearance. Spleen: Normal in appearance. Renal/Adrenal: The right adrenal gland appears to be normal in appearance but is in close proximity to the collection or calcified mass described above. The left adrenal gland is normal in appearance. The kidneys are normal in appearance. There is normal excretion and enhancement  bilaterally. Gastrointestinal tract: Large hiatal hernia is present. Small bowel loops are normal in appearance. Numerous colonic diverticular are present. Status post appendectomy. Reproductive/Pelvis: Urinary bladder is normal in appearance. The prostate is enlarged. Seminal vesicles are symmetric. No free pelvic fluid. Vascular/Lymphatic: There is atherosclerotic calcification of the abdominal aorta. The lower thoracic aorta is tortuous. No retroperitoneal or mesenteric adenopathy. Musculoskeletal/Abdominal wall: Abdominal wall is unremarkable. Status post anterior fusion of the lumbar spine at L4-5 and L5-S1. Superior endplate fracture at L2 and wedge compression fracture of L1 or chronic and stable in appearance. Vacuum disc phenomenon at L3-4. Other: none IMPRESSION: 1. Calcified mass or collection of contrast measuring 10 cm in the right upper quadrant retroperitoneum. Recommend correlation with history guarding any prior interventional procedures, contrast injection. The mass has a benign appearance and is unlikely causing the patient's symptoms. 2. Large hiatal hernia. 3.  Aortic atherosclerosis. 4. Enlarged prostate. 5. Prior spinal surgery and spondylosis. Electronically Signed   By: Nolon Nations M.D.   On: 12/22/2015 15:06     EKG  EKG Interpretation None         Procedures Procedures (including critical care time)  Medications Ordered in ED Medications  ondansetron (ZOFRAN) injection 4 mg (4 mg Intravenous Given 12/22/15 1029)  famotidine (PEPCID) IVPB 20 mg premix (0 mg Intravenous Stopped 12/22/15 1059)  fentaNYL (SUBLIMAZE) injection 50 mcg (50 mcg Intravenous Given 12/22/15 1029)  glucagon (human recombinant) (GLUCAGEN) injection 1 mg (1 mg Intravenous Given 12/22/15 1035)  gi cocktail (Maalox,Lidocaine,Donnatal) (30 mLs Oral Given 12/22/15 1135)  fentaNYL (SUBLIMAZE) 100 MCG/2ML injection (50 mcg  Given 12/22/15 1149)  ondansetron (ZOFRAN) injection 4 mg (4 mg Intravenous Given  12/22/15 1159)  iopamidol (ISOVUE-300) 61 % injection 100 mL (100 mLs Intravenous Incomplete 12/22/15 1402)     Initial Impression / Assessment and Plan / ED Course  I have reviewed the triage vital signs and the nursing notes.  Pertinent labs & imaging results that were available during my care of the patient were reviewed by me and considered in my medical decision making (  see chart for details).  Clinical Course    Discussed with Dr. Roderic Palau who also saw pt.  At initial presentation, suspicious for possible food bolus impaction as he has been vomiting saliva, but infrequently, and now also endorses was able to swallow coca cola ytd which he did not regurgitate.  CT imaging performed.  Large mass discussed with pt who reports this was found in 2015 at the time of his appendectomy.  At that time it was felt to be benign and could be worked up non emergently.  He states he has not pursued further eval.  States he forgot all about it.    Review of chart indicating large mass seen on CT (Dr. Melissa Montane note) (CT performed in [place]) although surgery performed at [facility], so unable today to compare size of mass today.  Pt states his pcp has copy of the CT from 2015.  Advised pt f/u with pcp this week for further discussion of this mass, although todays sx favor his large hiatal hernia as the source of his sx today.  He was prescribed protonix, carafate and also percocet and zofran if needed for sx relief.  Pt to call pcp for a recheck this week.    Final Clinical Impressions(s) / ED Diagnoses   Final diagnoses:  Hiatal hernia  Abdominal mass    New Prescriptions Discharge Medication List as of 12/22/2015  3:48 PM    START taking these medications   Details  ondansetron (ZOFRAN ODT) 8 MG disintegrating tablet Take 1 tablet (8 mg total) by mouth every 8 (eight) hours as needed for nausea or vomiting., Starting Sat 12/22/2015, Print    oxyCODONE-acetaminophen (PERCOCET/ROXICET) 5-325 MG tablet Take 1  tablet by mouth every 4 (four) hours as needed., Starting Sat 12/22/2015, Print    pantoprazole (PROTONIX) 40 MG tablet Take 1 tablet (40 mg total) by mouth daily., Starting Sat 12/22/2015, Print         Evalee Jefferson, PA-C 12/22/15 1652    Milton Ferguson, MD 12/23/15 [phone]

## 2015-12-23 ENCOUNTER — Emergency Department (HOSPITAL_COMMUNITY): Payer: Medicare HMO

## 2015-12-23 ENCOUNTER — Inpatient Hospital Stay (HOSPITAL_COMMUNITY)
Admission: EM | Admit: 2015-12-23 | Discharge: 2015-12-29 | DRG: 327 | Disposition: A | Discharge status: Home / Self Care | Payer: Medicare HMO | Attending: Internal Medicine | Admitting: Internal Medicine

## 2015-12-23 ENCOUNTER — Encounter (HOSPITAL_COMMUNITY): Payer: Self-pay | Admitting: *Deleted

## 2015-12-23 DIAGNOSIS — C480 Malignant neoplasm of retroperitoneum: Secondary | ICD-10-CM | POA: Diagnosis present

## 2015-12-23 DIAGNOSIS — E860 Dehydration: Secondary | ICD-10-CM | POA: Diagnosis present

## 2015-12-23 DIAGNOSIS — R1013 Epigastric pain: Secondary | ICD-10-CM | POA: Diagnosis present

## 2015-12-23 DIAGNOSIS — Z79899 Other long term (current) drug therapy: Secondary | ICD-10-CM

## 2015-12-23 DIAGNOSIS — R030 Elevated blood-pressure reading, without diagnosis of hypertension: Secondary | ICD-10-CM | POA: Diagnosis not present

## 2015-12-23 DIAGNOSIS — K449 Diaphragmatic hernia without obstruction or gangrene: Secondary | ICD-10-CM | POA: Diagnosis not present

## 2015-12-23 DIAGNOSIS — E278 Other specified disorders of adrenal gland: Secondary | ICD-10-CM | POA: Diagnosis present

## 2015-12-23 DIAGNOSIS — R739 Hyperglycemia, unspecified: Secondary | ICD-10-CM | POA: Diagnosis present

## 2015-12-23 DIAGNOSIS — Z87891 Personal history of nicotine dependence: Secondary | ICD-10-CM

## 2015-12-23 DIAGNOSIS — Z888 Allergy status to other drugs, medicaments and biological substances status: Secondary | ICD-10-CM

## 2015-12-23 DIAGNOSIS — R1110 Vomiting, unspecified: Secondary | ICD-10-CM

## 2015-12-23 DIAGNOSIS — K228 Other specified diseases of esophagus: Secondary | ICD-10-CM | POA: Diagnosis not present

## 2015-12-23 DIAGNOSIS — R1901 Right upper quadrant abdominal swelling, mass and lump: Secondary | ICD-10-CM | POA: Diagnosis present

## 2015-12-23 DIAGNOSIS — K440 Diaphragmatic hernia with obstruction, without gangrene: Principal | ICD-10-CM | POA: Diagnosis present

## 2015-12-23 DIAGNOSIS — R1310 Dysphagia, unspecified: Secondary | ICD-10-CM

## 2015-12-23 DIAGNOSIS — R079 Chest pain, unspecified: Secondary | ICD-10-CM | POA: Diagnosis not present

## 2015-12-23 DIAGNOSIS — K209 Esophagitis, unspecified: Secondary | ICD-10-CM | POA: Diagnosis present

## 2015-12-23 DIAGNOSIS — Z885 Allergy status to narcotic agent status: Secondary | ICD-10-CM

## 2015-12-23 DIAGNOSIS — E2749 Other adrenocortical insufficiency: Secondary | ICD-10-CM | POA: Diagnosis present

## 2015-12-23 DIAGNOSIS — E440 Moderate protein-calorie malnutrition: Secondary | ICD-10-CM | POA: Diagnosis present

## 2015-12-23 DIAGNOSIS — Z886 Allergy status to analgesic agent status: Secondary | ICD-10-CM

## 2015-12-23 DIAGNOSIS — R1900 Intra-abdominal and pelvic swelling, mass and lump, unspecified site: Secondary | ICD-10-CM | POA: Diagnosis present

## 2015-12-23 DIAGNOSIS — E46 Unspecified protein-calorie malnutrition: Secondary | ICD-10-CM | POA: Diagnosis not present

## 2015-12-23 DIAGNOSIS — D72829 Elevated white blood cell count, unspecified: Secondary | ICD-10-CM | POA: Diagnosis present

## 2015-12-23 HISTORY — DX: Unspecified osteoarthritis, unspecified site: M19.90

## 2015-12-23 HISTORY — DX: Personal history of other diseases of the digestive system: Z87.19

## 2015-12-23 HISTORY — DX: Unspecified acute appendicitis: K35.80

## 2015-12-23 LAB — MAGNESIUM: Magnesium: 1.8 mg/dL (ref 1.7–2.4)

## 2015-12-23 LAB — COMPREHENSIVE METABOLIC PANEL
ALT: 16 U/L — ABNORMAL LOW (ref 17–63)
AST: 26 U/L (ref 15–41)
Albumin: 4.1 g/dL (ref 3.5–5.0)
Alkaline Phosphatase: 96 U/L (ref 38–126)
Anion gap: 11 (ref 5–15)
BUN: 14 mg/dL (ref 6–20)
CO2: 24 mmol/L (ref 22–32)
Calcium: 9.5 mg/dL (ref 8.9–10.3)
Chloride: 103 mmol/L (ref 101–111)
Creatinine, Ser: 1.11 mg/dL (ref 0.61–1.24)
GFR calc Af Amer: >60 mL/min (ref >60)
GFR calc non Af Amer: >60 mL/min (ref >60)
Glucose, Bld: 144 mg/dL — ABNORMAL HIGH (ref 65–99)
Potassium: 3.6 mmol/L (ref 3.5–5.1)
Sodium: 138 mmol/L (ref 135–145)
Total Bilirubin: 1.1 mg/dL (ref 0.3–1.2)
Total Protein: 6.6 g/dL (ref 6.5–8.1)

## 2015-12-23 LAB — CBC
HCT: 45.6 % (ref 39.0–52.0)
Hemoglobin: 15.5 g/dL (ref 13.0–17.0)
MCH: 32.4 pg (ref 26.0–34.0)
MCHC: 34 g/dL (ref 30.0–36.0)
MCV: 95.4 fL (ref 78.0–100.0)
Platelets: 238 10*3/uL (ref 150–400)
RBC: 4.78 MIL/uL (ref 4.22–5.81)
RDW: 12.7 % (ref 11.5–15.5)
WBC: 13 10*3/uL — ABNORMAL HIGH (ref 4.0–10.5)

## 2015-12-23 LAB — LIPASE, BLOOD: Lipase: 55 U/L — ABNORMAL HIGH (ref 11–51)

## 2015-12-23 LAB — I-STAT TROPONIN, ED: Troponin i, poc: 0 ng/mL (ref 0.00–0.08)

## 2015-12-23 LAB — LACTIC ACID, PLASMA: Lactic Acid, Venous: 1.2 mmol/L (ref 0.5–1.9)

## 2015-12-23 LAB — PHOSPHORUS: Phosphorus: 1.6 mg/dL — ABNORMAL LOW (ref 2.5–4.6)

## 2015-12-23 MED ORDER — SODIUM CHLORIDE 0.9 % IV SOLN
INTRAVENOUS | Status: DC
  Administered 2015-12-23 – 2015-12-27 (×5): via INTRAVENOUS

## 2015-12-23 MED ORDER — SODIUM CHLORIDE 0.9 % IV SOLN: INTRAVENOUS | Status: DC

## 2015-12-23 MED ORDER — SUCRALFATE 1 GM/10ML PO SUSP
1.0000 g | Freq: Three times a day (TID) | ORAL | Status: DC
  Administered 2015-12-23 – 2015-12-27 (×10): 1 g via ORAL
  Filled 2015-12-23 (×10): qty 10

## 2015-12-23 MED ORDER — FOLIC ACID 5 MG/ML IJ SOLN
1.0000 mg | Freq: Every day | INTRAMUSCULAR | Status: DC
  Administered 2015-12-23 – 2015-12-28 (×5): 1 mg via INTRAVENOUS
  Filled 2015-12-23 (×8): qty 0.2

## 2015-12-23 MED ORDER — ACETAMINOPHEN 325 MG PO TABS: 650.0000 mg | ORAL_TABLET | Freq: Four times a day (QID) | ORAL | Status: DC | PRN

## 2015-12-23 MED ORDER — ALUM & MAG HYDROXIDE-SIMETH 200-200-20 MG/5ML PO SUSP
30.0000 mL | Freq: Once | ORAL | Status: AC
  Administered 2015-12-23: 30 mL via ORAL
  Filled 2015-12-23: qty 30

## 2015-12-23 MED ORDER — PANTOPRAZOLE SODIUM 40 MG IV SOLR
40.0000 mg | Freq: Two times a day (BID) | INTRAVENOUS | Status: DC
  Administered 2015-12-27: 40 mg via INTRAVENOUS
  Filled 2015-12-23 (×2): qty 40

## 2015-12-23 MED ORDER — THIAMINE HCL 100 MG/ML IJ SOLN
100.0000 mg | Freq: Every day | INTRAMUSCULAR | Status: DC
  Administered 2015-12-23: 17:00:00 via INTRAVENOUS
  Administered 2015-12-24 – 2015-12-28 (×5): 100 mg via INTRAVENOUS
  Filled 2015-12-23 (×7): qty 2

## 2015-12-23 MED ORDER — SODIUM CHLORIDE 0.9 % IV BOLUS (SEPSIS)
1000.0000 mL | Freq: Once | INTRAVENOUS | Status: AC
  Administered 2015-12-23: 1000 mL via INTRAVENOUS

## 2015-12-23 MED ORDER — ONDANSETRON HCL 4 MG/2ML IJ SOLN
4.0000 mg | Freq: Four times a day (QID) | INTRAMUSCULAR | Status: DC | PRN
  Administered 2015-12-23 – 2015-12-25 (×8): 4 mg via INTRAVENOUS
  Filled 2015-12-23 (×8): qty 2

## 2015-12-23 MED ORDER — BOOST / RESOURCE BREEZE PO LIQD
1.0000 | Freq: Three times a day (TID) | ORAL | Status: DC
  Administered 2015-12-23 – 2015-12-28 (×9): 1 via ORAL

## 2015-12-23 MED ORDER — FAMOTIDINE IN NACL 20-0.9 MG/50ML-% IV SOLN
20.0000 mg | Freq: Two times a day (BID) | INTRAVENOUS | Status: DC
  Administered 2015-12-23 – 2015-12-28 (×11): 20 mg via INTRAVENOUS
  Filled 2015-12-23 (×14): qty 50

## 2015-12-23 MED ORDER — FAMOTIDINE IN NACL 20-0.9 MG/50ML-% IV SOLN
20.0000 mg | Freq: Once | INTRAVENOUS | Status: AC
  Administered 2015-12-23: 20 mg via INTRAVENOUS
  Filled 2015-12-23: qty 50

## 2015-12-23 MED ORDER — GI COCKTAIL ~~LOC~~: 30.0000 mL | Freq: Once | ORAL | Status: DC

## 2015-12-23 MED ORDER — FENTANYL CITRATE (PF) 100 MCG/2ML IJ SOLN
12.5000 ug | INTRAMUSCULAR | Status: DC | PRN
  Administered 2015-12-23 – 2015-12-25 (×9): 12.5 ug via INTRAVENOUS
  Filled 2015-12-23 (×10): qty 2

## 2015-12-23 MED ORDER — FENTANYL CITRATE (PF) 100 MCG/2ML IJ SOLN
100.0000 ug | Freq: Once | INTRAMUSCULAR | Status: AC
  Administered 2015-12-23: 100 ug via INTRAVENOUS
  Filled 2015-12-23: qty 2

## 2015-12-23 MED ORDER — ACETAMINOPHEN 650 MG RE SUPP: 650.0000 mg | Freq: Four times a day (QID) | RECTAL | Status: DC | PRN

## 2015-12-23 MED ORDER — POTASSIUM PHOSPHATES 15 MMOLE/5ML IV SOLN
30.0000 mmol | Freq: Once | INTRAVENOUS | Status: AC
  Administered 2015-12-23: 30 mmol via INTRAVENOUS
  Filled 2015-12-23: qty 10

## 2015-12-23 MED ORDER — ENOXAPARIN SODIUM 40 MG/0.4ML ~~LOC~~ SOLN
40.0000 mg | SUBCUTANEOUS | Status: DC
  Administered 2015-12-23 – 2015-12-28 (×5): 40 mg via SUBCUTANEOUS
  Filled 2015-12-23 (×5): qty 0.4

## 2015-12-23 NOTE — ED Provider Notes (Signed)
[facility] DEPT Provider Note   CSN: [id number] Arrival date & time: 12/23/15  0807     History   Chief Complaint Chief Complaint  Patient presents with  . Abdominal Pain    HPI William Avila is a 70 y.o. male.  HPI  History reviewed. No pertinent past medical history. Complains of epigastric pain onset 3 days ago nonradiating. Associated by multiple episodes of vomiting. Last vomited this morning, yellowish material. Last bowel movement yesterday, normal. No fever. Pain worse with pressing on the area not improved by anything. Moderate to severe at present. he is uncertain if he feels that food is stuck, points to epigastric area. He was seen at [facility] emergency department yesterday, had CT scan of abdomen and pelvis performed yesterday which showed large hiatal hernia and benign appearing mass in retroperitoneum which was old. Prescribed sucralfate, Protonix, Zofran, and Percocet which  taken without relief. No other associated symptoms  Patient Active Problem List   Diagnosis Date Noted  . Acute appendicitis 01/21/2014  . Appendicitis 01/20/2014    Past Surgical History:  Procedure Laterality Date  . BACK SURGERY    . CERVICAL SPINE SURGERY    . LAPAROSCOPIC APPENDECTOMY N/A 01/21/2014   Procedure: APPENDECTOMY LAPAROSCOPIC;  Surgeon: Autumn Messing III, MD;  Location: [facility];  Service: General;  Laterality: N/A;  . TRIGGER FINGER RELEASE         Home Medications    Prior to Admission medications   Medication Sig Start Date End Date Taking? Authorizing Provider  acetaminophen (TYLENOL) 500 MG tablet Take 500 mg by mouth every 6 (six) hours as needed for mild pain.    Historical Provider, MD  Multiple Vitamins-Minerals (MULTIVITAMIN WITH MINERALS) tablet Take 1 tablet by mouth daily.    Historical Provider, MD  naproxen sodium (ALEVE) 220 MG tablet Take 440 mg by mouth 2 (two) times daily as needed (pain).    Historical Provider, MD  ondansetron (ZOFRAN ODT) 8  MG disintegrating tablet Take 1 tablet (8 mg total) by mouth every 8 (eight) hours as needed for nausea or vomiting. 12/22/15   Evalee Jefferson, PA-C  oxyCODONE-acetaminophen (PERCOCET/ROXICET) 5-325 MG tablet Take 1 tablet by mouth every 4 (four) hours as needed. 12/22/15   Evalee Jefferson, PA-C  pantoprazole (PROTONIX) 40 MG tablet Take 1 tablet (40 mg total) by mouth daily. 12/22/15   Evalee Jefferson, PA-C  sucralfate (CARAFATE) 1 g tablet Take 1 tablet (1 g total) by mouth 4 (four) times daily -  with meals and at bedtime. 12/22/15   Evalee Jefferson, PA-C    Family History History reviewed. No pertinent family history.  Social History Social History  Substance Use Topics  . Smoking status: Former Smoker    Types: Cigarettes  . Smokeless tobacco: Never Used  . Alcohol use Yes     Comment: Occassionally     Allergies   Codeine; Ibuprofen; and Robaxin [methocarbamol]   Review of Systems Review of Systems  Constitutional: Negative.   HENT: Negative.   Respiratory: Negative.   Cardiovascular: Positive for chest pain.       Syncope  Gastrointestinal: Positive for abdominal pain, nausea and vomiting.  Musculoskeletal: Negative.   Skin: Negative.   Allergic/Immunologic: Negative.   Neurological: Negative.   Psychiatric/Behavioral: Negative.   All other systems reviewed and are negative.    Physical Exam Updated Vital Signs BP 160/100 (BP Location: Left Arm)   Pulse 72   Temp 98.4 F (36.9 C) (Oral)   Resp  20   Ht 6' (1.829 m)   Wt 170 lb (77.1 kg)   SpO2 99%   BMI 23.06 kg/m   Physical Exam  Constitutional: He appears well-developed and well-nourished. He appears distressed.  Ill-appearing alert Glasgow Coma Score 15  HENT:  Head: Normocephalic and atraumatic.  Mucous membranes dry  Eyes: Conjunctivae are normal. Pupils are equal, round, and reactive to light.  Neck: Neck supple. No tracheal deviation present. No thyromegaly present.  Cardiovascular: Normal rate and regular rhythm.     No murmur heard. Pulmonary/Chest: Effort normal and breath sounds normal.  Abdominal: Soft. Bowel sounds are normal. He exhibits no distension. There is tenderness.  Tender at epigastrium  Genitourinary: Penis normal.  Genitourinary Comments: scrotim normal  Musculoskeletal: Normal range of motion. He exhibits no edema or tenderness.  Neurological: He is alert. Coordination normal.  Skin: Skin is warm and dry. No rash noted.  Psychiatric: He has a normal mood and affect.  Nursing note and vitals reviewed.    ED Treatments / Results  Labs (all labs ordered are listed, but only abnormal results are displayed) Labs Reviewed  COMPREHENSIVE METABOLIC PANEL  CBC  LIPASE, BLOOD  I-STAT [place], ED    EKG  EKG Interpretation None       Radiology Dg Chest 2 View  Result Date: 12/22/2015 CLINICAL DATA:  Chest and upper abdomen pain starting yesterday EXAM: CHEST  2 VIEW COMPARISON:  May 08, 2007 FINDINGS: The heart size and mediastinal contours are within normal limits. There is no focal infiltrate, pulmonary edema, or pleural effusion. The visualized skeletal structures are stable. There is a large hiatal hernia. IMPRESSION: No active cardiopulmonary disease. Electronically Signed   By: Abelardo Diesel M.D.   On: 12/22/2015 11:05   Ct Abdomen Pelvis W Contrast  Result Date: 12/22/2015 CLINICAL DATA:  Epigastric pain when trying to eat or drink. Symptoms began yesterday when he ate lunch. EXAM: CT ABDOMEN AND PELVIS WITH CONTRAST TECHNIQUE: Multidetector CT imaging of the abdomen and pelvis was performed using the standard protocol following bolus administration of intravenous contrast. CONTRAST:  100 cc Isovue-300 COMPARISON:  Lumbar spine films 08/30/2010, chest x-ray 12/22/2015 FINDINGS: Lower chest: There is small amount of scarring at both lung bases. Heart size is normal. Minimal atherosclerosis at the level of the aortic valve. There is a large hiatal hernia. Hepatobiliary:  Posterior to liver, superior to the right kidney, and lateral to the adrenal gland, there is a hyperdense calcified mass or collection contrast which measures 8.8 x 10.0 x 6.9 cm. Correlation with history is recommended. Has the patient had interventional procedures performed in the right upper quadrant? Pancreas: Normal in appearance. Spleen: Normal in appearance. Renal/Adrenal: The right adrenal gland appears to be normal in appearance but is in close proximity to the collection or calcified mass described above. The left adrenal gland is normal in appearance. The kidneys are normal in appearance. There is normal excretion and enhancement bilaterally. Gastrointestinal tract: Large hiatal hernia is present. Small bowel loops are normal in appearance. Numerous colonic diverticular are present. Status post appendectomy. Reproductive/Pelvis: Urinary bladder is normal in appearance. The prostate is enlarged. Seminal vesicles are symmetric. No free pelvic fluid. Vascular/Lymphatic: There is atherosclerotic calcification of the abdominal aorta. The lower thoracic aorta is tortuous. No retroperitoneal or mesenteric adenopathy. Musculoskeletal/Abdominal wall: Abdominal wall is unremarkable. Status post anterior fusion of the lumbar spine at L4-5 and L5-S1. Superior endplate fracture at L2 and wedge compression fracture of L1 or chronic and  stable in appearance. Vacuum disc phenomenon at L3-4. Other: none IMPRESSION: 1. Calcified mass or collection of contrast measuring 10 cm in the right upper quadrant retroperitoneum. Recommend correlation with history guarding any prior interventional procedures, contrast injection. The mass has a benign appearance and is unlikely causing the patient's symptoms. 2. Large hiatal hernia. 3.  Aortic atherosclerosis. 4. Enlarged prostate. 5. Prior spinal surgery and spondylosis. Electronically Signed   By: Nolon Nations M.D.   On: 12/22/2015 15:06    Procedures Procedures (including  critical care time)  Medications Ordered in ED Medications  famotidine (PEPCID) IVPB 20 mg premix (not administered)   10:25 AM feels much improved after treatment with IV Pepcid and with antacid  10:40 AM pain became much worse after attempted to feed patient. No further vomiting. 11:20 AM he is resting comfortably more comfortably after treatment with intravenous fentanyl. Gastroenterologist Dr.Brambatt consulted and will see patient in hospital. He requests upper GI series and inpatient stay by hospital Hospitalist service consulted and will arrange for 23 hour observation to med surge floor Results for orders placed or performed during the hospital encounter of 12/23/15  Comprehensive metabolic panel  Result Value Ref Range   Sodium 138 135 - 145 mmol/L   Potassium 3.6 3.5 - 5.1 mmol/L   Chloride 103 101 - 111 mmol/L   CO2 24 22 - 32 mmol/L   Glucose, Bld 144 (H) 65 - 99 mg/dL   BUN 14 6 - 20 mg/dL   Creatinine, Ser 1.11 0.61 - 1.24 mg/dL   Calcium 9.5 8.9 - 10.3 mg/dL   Total Protein 6.6 6.5 - 8.1 g/dL   Albumin 4.1 3.5 - 5.0 g/dL   AST 26 15 - 41 U/L   ALT 16 (L) 17 - 63 U/L   Alkaline Phosphatase 96 38 - 126 U/L   Total Bilirubin 1.1 0.3 - 1.2 mg/dL   GFR calc non Af Amer >60 >60 mL/min   GFR calc Af Amer >60 >60 mL/min   Anion gap 11 5 - 15  CBC  Result Value Ref Range   WBC 13.0 (H) 4.0 - 10.5 K/uL   RBC 4.78 4.22 - 5.81 MIL/uL   Hemoglobin 15.5 13.0 - 17.0 g/dL   HCT 45.6 39.0 - 52.0 %   MCV 95.4 78.0 - 100.0 fL   MCH 32.4 26.0 - 34.0 pg   MCHC 34.0 30.0 - 36.0 g/dL   RDW 12.7 11.5 - 15.5 %   Platelets 238 150 - 400 K/uL  Lipase, blood  Result Value Ref Range   Lipase 55 (H) 11 - 51 U/L  I-stat troponin, ED  Result Value Ref Range   Troponin i, poc 0.00 0.00 - 0.08 ng/mL   Comment 3           Dg Chest 2 View  Result Date: 12/22/2015 CLINICAL DATA:  Chest and upper abdomen pain starting yesterday EXAM: CHEST  2 VIEW COMPARISON:  May 08, 2007 FINDINGS:  The heart size and mediastinal contours are within normal limits. There is no focal infiltrate, pulmonary edema, or pleural effusion. The visualized skeletal structures are stable. There is a large hiatal hernia. IMPRESSION: No active cardiopulmonary disease. Electronically Signed   By: Abelardo Diesel M.D.   On: 12/22/2015 11:05   Ct Abdomen Pelvis W Contrast  Result Date: 12/22/2015 CLINICAL DATA:  Epigastric pain when trying to eat or drink. Symptoms began yesterday when he ate lunch. EXAM: CT ABDOMEN AND PELVIS WITH CONTRAST TECHNIQUE: Multidetector CT  imaging of the abdomen and pelvis was performed using the standard protocol following bolus administration of intravenous contrast. CONTRAST:  100 cc Isovue-300 COMPARISON:  Lumbar spine films 08/30/2010, chest x-ray 12/22/2015 FINDINGS: Lower chest: There is small amount of scarring at both lung bases. Heart size is normal. Minimal atherosclerosis at the level of the aortic valve. There is a large hiatal hernia. Hepatobiliary: Posterior to liver, superior to the right kidney, and lateral to the adrenal gland, there is a hyperdense calcified mass or collection contrast which measures 8.8 x 10.0 x 6.9 cm. Correlation with history is recommended. Has the patient had interventional procedures performed in the right upper quadrant? Pancreas: Normal in appearance. Spleen: Normal in appearance. Renal/Adrenal: The right adrenal gland appears to be normal in appearance but is in close proximity to the collection or calcified mass described above. The left adrenal gland is normal in appearance. The kidneys are normal in appearance. There is normal excretion and enhancement bilaterally. Gastrointestinal tract: Large hiatal hernia is present. Small bowel loops are normal in appearance. Numerous colonic diverticular are present. Status post appendectomy. Reproductive/Pelvis: Urinary bladder is normal in appearance. The prostate is enlarged. Seminal vesicles are symmetric. No  free pelvic fluid. Vascular/Lymphatic: There is atherosclerotic calcification of the abdominal aorta. The lower thoracic aorta is tortuous. No retroperitoneal or mesenteric adenopathy. Musculoskeletal/Abdominal wall: Abdominal wall is unremarkable. Status post anterior fusion of the lumbar spine at L4-5 and L5-S1. Superior endplate fracture at L2 and wedge compression fracture of L1 or chronic and stable in appearance. Vacuum disc phenomenon at L3-4. Other: none IMPRESSION: 1. Calcified mass or collection of contrast measuring 10 cm in the right upper quadrant retroperitoneum. Recommend correlation with history guarding any prior interventional procedures, contrast injection. The mass has a benign appearance and is unlikely causing the patient's symptoms. 2. Large hiatal hernia. 3.  Aortic atherosclerosis. 4. Enlarged prostate. 5. Prior spinal surgery and spondylosis. Electronically Signed   By: Nolon Nations M.D.   On: 12/22/2015 15:06   Dg Chest Port 1 View  Result Date: 12/23/2015 CLINICAL DATA:  Mid chest pain. EXAM: PORTABLE CHEST 1 VIEW COMPARISON:  December 22, 2015 FINDINGS: The heart size and mediastinal contours are within normal limits. There is no focal infiltrate, pulmonary edema, or pleural effusion. Hiatal hernia is identified. Overlying soft tissue is projected over the upper chest bilaterally. The visualized skeletal structures are stable. IMPRESSION: No active cardiopulmonary disease. Electronically Signed   By: Abelardo Diesel M.D.   On: 12/23/2015 09:08  Chest x-ray viewed by me Initial Impression / Assessment and Plan / ED Course  I have reviewed the triage vital signs and the nursing notes.  Pertinent labs & imaging results that were available during my care of the patient were reviewed by me and considered in my medical decision making (see chart for details).  Clinical Course      Final Clinical Impressions(s) / ED Diagnoses   Final diagnoses:  None   Diagnosis #1  epigastric pain #2 hyperglycemia New Prescriptions New Prescriptions   No medications on file     Orlie Dakin, MD 12/23/15 1157

## 2015-12-23 NOTE — Consult Note (Addendum)
Referring Provider:  [facility] ER  Primary Care Physician:  Irven Shelling, MD Primary Gastroenterologist:  Dr.Weisssman   Reason for Consultation: Epigastric pain, poor oral intake  HPI: William Avila is a 70 y.o. male presented to [facility] cone  ER for ongoing epigastric pain. Patient with history of on and off epigastric discomfort since last 2 months. Patient's symptoms started getting worse last Friday. Described as severe  debilitating pain in epigastric region with overall intake. Pain is sharp and burning in nature. Associated with nausea and multiple dry heaves and only one episode of small nonbloody vomiting. Patient went to [facility] emergency room where CT scan was performed which revealed large hiatal hernia. Patient was discharged home but continues to have worsening symptoms. Call primary care physician and he advised him to come to ER again. GI is consulted for further evaluation. Patient's pain is epigastric in location with radiation to the substernal area. Sharp and burning. 10 out of 10. Worse with any oral intake. He lives only with pain medication. No significant improvement with acid suppression pills.  No prior endoscopy. Patient is not able to eat or drink anything secondary to worsening pain, nausea and dry heaves.  Last colonoscopy in September 2007 by Dr. Sammuel Cooper showed left-sided diverticulosis. No polyps.  Past Medical History:  Diagnosis Date  . Arthritis   . History of hiatal hernia     Past Surgical History:  Procedure Laterality Date  . BACK SURGERY     and neck and trigger finger surgery  . CERVICAL SPINE SURGERY    . LAPAROSCOPIC APPENDECTOMY N/A 01/21/2014   Procedure: APPENDECTOMY LAPAROSCOPIC;  Surgeon: Autumn Messing III, MD;  Location: [place];  Service: General;  Laterality: N/A;  . TRIGGER FINGER RELEASE      Prior to Admission medications   Medication Sig Start Date End Date Taking? Authorizing Provider  acetaminophen (TYLENOL) 500 MG tablet Take  500 mg by mouth every 6 (six) hours as needed for mild pain.   Yes Historical Provider, MD  Multiple Vitamins-Minerals (MULTIVITAMIN WITH MINERALS) tablet Take 1 tablet by mouth daily.   Yes Historical Provider, MD  naproxen sodium (ALEVE) 220 MG tablet Take 440 mg by mouth 2 (two) times daily as needed (pain).   Yes Historical Provider, MD  ondansetron (ZOFRAN ODT) 8 MG disintegrating tablet Take 1 tablet (8 mg total) by mouth every 8 (eight) hours as needed for nausea or vomiting. 12/22/15  Yes Evalee Jefferson, PA-C  oxyCODONE-acetaminophen (PERCOCET/ROXICET) 5-325 MG tablet Take 1 tablet by mouth every 4 (four) hours as needed. 12/22/15  Yes Almyra Free Idol, PA-C  pantoprazole (PROTONIX) 40 MG tablet Take 1 tablet (40 mg total) by mouth daily. 12/22/15  Yes Almyra Free Idol, PA-C  sucralfate (CARAFATE) 1 g tablet Take 1 tablet (1 g total) by mouth 4 (four) times daily -  with meals and at bedtime. 12/22/15  Yes Evalee Jefferson, PA-C    Scheduled Meds: . enoxaparin (LOVENOX) injection  40 mg Subcutaneous Q24H  . famotidine (PEPCID) IV  20 mg Intravenous Q12H  . feeding supplement  1 Container Oral TID BM  . folic acid  1 mg Intravenous Daily  . potassium phosphate IVPB (mmol)  30 mmol Intravenous Once  . sucralfate  1 g Oral TID WC & HS  . thiamine  100 mg Intravenous Daily   Continuous Infusions: . sodium chloride 75 mL/hr at 12/23/15 1238   PRN Meds:.acetaminophen **OR** acetaminophen, fentaNYL (SUBLIMAZE) injection, ondansetron (ZOFRAN) IV  Allergies as of 12/23/2015 -  Review Complete 12/23/2015  Allergen Reaction Noted  . Codeine Anaphylaxis 01/20/2014  . Ibuprofen Anaphylaxis 01/20/2014  . Robaxin [methocarbamol] Anaphylaxis, Hives, and Swelling 02/11/2015    History reviewed. No pertinent family history.  Social History   Social History  . Marital status: Married    Spouse name: N/A  . Number of children: N/A  . Years of education: N/A   Occupational History  . Not on file.   Social History  Main Topics  . Smoking status: Former Smoker    Types: Cigarettes  . Smokeless tobacco: Never Used  . Alcohol use Yes     Comment: Occassionally  . Drug use: No  . Sexual activity: Not on file   Other Topics Concern  . Not on file   Social History Narrative  . No narrative on file    Review of Systems: All negative except as stated above in HPI.  Physical Exam: Vital signs: Vitals:   12/23/15 1215 12/23/15 1310  BP: (!) 158/101 (!) 150/124  Pulse: (!) 57 66  Resp: 12 13  Temp:  98.2 F (36.8 C)   Last BM Date: 12/22/15 General:   Alert,  Well-developed, well-nourished, pleasant and cooperative in NAD Lungs:  Clear throughout to auscultation.   No wheezes, crackles, or rhonchi. No acute distress. Heart:  Regular rate and rhythm; no murmurs, clicks, rubs,  or gallops. Abdomen: Epigastric tenderness to palpation, soft, nondistended, bowel sounds present LE : no edema   GI:  Lab Results:  Recent Labs  12/22/15 1015 12/23/15 0851  WBC 11.2* 13.0*  HGB 15.7 15.5  HCT 44.8 45.6  PLT 252 238   BMET  Recent Labs  12/22/15 1015 12/23/15 0851  NA 139 138  K 3.5 3.6  CL 100* 103  CO2 27 24  GLUCOSE 127* 144*  BUN 13 14  CREATININE 0.91 1.11  CALCIUM 10.0 9.5   LFT  Recent Labs  12/23/15 0851  PROT 6.6  ALBUMIN 4.1  AST 26  ALT 16*  ALKPHOS 96  BILITOT 1.1   PT/INR No results for input(s): LABPROT, INR in the last 72 hours.   Studies/Results: Dg Chest 2 View  Result Date: 12/22/2015 CLINICAL DATA:  Chest and upper abdomen pain starting yesterday EXAM: CHEST  2 VIEW COMPARISON:  May 08, 2007 FINDINGS: The heart size and mediastinal contours are within normal limits. There is no focal infiltrate, pulmonary edema, or pleural effusion. The visualized skeletal structures are stable. There is a large hiatal hernia. IMPRESSION: No active cardiopulmonary disease. Electronically Signed   By: Abelardo Diesel M.D.   On: 12/22/2015 11:05   Ct Abdomen Pelvis  W Contrast  Result Date: 12/22/2015 CLINICAL DATA:  Epigastric pain when trying to eat or drink. Symptoms began yesterday when he ate lunch. EXAM: CT ABDOMEN AND PELVIS WITH CONTRAST TECHNIQUE: Multidetector CT imaging of the abdomen and pelvis was performed using the standard protocol following bolus administration of intravenous contrast. CONTRAST:  100 cc Isovue-300 COMPARISON:  Lumbar spine films 08/30/2010, chest x-ray 12/22/2015 FINDINGS: Lower chest: There is small amount of scarring at both lung bases. Heart size is normal. Minimal atherosclerosis at the level of the aortic valve. There is a large hiatal hernia. Hepatobiliary: Posterior to liver, superior to the right kidney, and lateral to the adrenal gland, there is a hyperdense calcified mass or collection contrast which measures 8.8 x 10.0 x 6.9 cm. Correlation with history is recommended. Has the patient had interventional procedures performed in the right upper  quadrant? Pancreas: Normal in appearance. Spleen: Normal in appearance. Renal/Adrenal: The right adrenal gland appears to be normal in appearance but is in close proximity to the collection or calcified mass described above. The left adrenal gland is normal in appearance. The kidneys are normal in appearance. There is normal excretion and enhancement bilaterally. Gastrointestinal tract: Large hiatal hernia is present. Small bowel loops are normal in appearance. Numerous colonic diverticular are present. Status post appendectomy. Reproductive/Pelvis: Urinary bladder is normal in appearance. The prostate is enlarged. Seminal vesicles are symmetric. No free pelvic fluid. Vascular/Lymphatic: There is atherosclerotic calcification of the abdominal aorta. The lower thoracic aorta is tortuous. No retroperitoneal or mesenteric adenopathy. Musculoskeletal/Abdominal wall: Abdominal wall is unremarkable. Status post anterior fusion of the lumbar spine at L4-5 and L5-S1. Superior endplate fracture at L2  and wedge compression fracture of L1 or chronic and stable in appearance. Vacuum disc phenomenon at L3-4. Other: none IMPRESSION: 1. Calcified mass or collection of contrast measuring 10 cm in the right upper quadrant retroperitoneum. Recommend correlation with history guarding any prior interventional procedures, contrast injection. The mass has a benign appearance and is unlikely causing the patient's symptoms. 2. Large hiatal hernia. 3.  Aortic atherosclerosis. 4. Enlarged prostate. 5. Prior spinal surgery and spondylosis. Electronically Signed   By: Nolon Nations M.D.   On: 12/22/2015 15:06   Dg Chest Port 1 View  Result Date: 12/23/2015 CLINICAL DATA:  Mid chest pain. EXAM: PORTABLE CHEST 1 VIEW COMPARISON:  December 22, 2015 FINDINGS: The heart size and mediastinal contours are within normal limits. There is no focal infiltrate, pulmonary edema, or pleural effusion. Hiatal hernia is identified. Overlying soft tissue is projected over the upper chest bilaterally. The visualized skeletal structures are stable. IMPRESSION: No active cardiopulmonary disease. Electronically Signed   By: Abelardo Diesel M.D.   On: 12/23/2015 09:08    Impression/Plan: Severe epigastric pain with CT scan showing large hiatal hernia. Need to rule out ulcer disease.  Recommendations ------------------------- - EGD tomorrow for further evaluation. Risk, benefits and alternatives discussed with the patient. Verbalized understanding. - Upper GI series was recommended but per radiology it's not necessarily given recent CT scan. Upper GI series would give a better idea about patient's hiatal hernia than CT scan. - IV BID PPI - Appreciate surgery input. - Nothing by mouth past midnight - Patient is due for his colonoscopy for colorectal cancer screening.    LOS: 0 days   Lonna Rabold  12/23/2015, 5:14 PM  Pager [phone] If no answer or after 5 PM call [phone]

## 2015-12-23 NOTE — ED Notes (Signed)
Gave pt crackers and water for PO challenge

## 2015-12-23 NOTE — Consult Note (Signed)
Reason for Consult:Hiatal Herina Referring Physician: Erin Hearing, NP  William Avila is an 70 y.o. male.  HPI: This is a pleasant gentleman who presents with epigastric abdominal pain. He has been having increasing discomfort in his epigastric area after eating for several weeks. He has had some emesis with this. It is worsened over the past 2 days. He discussed the pain is a cramping discomfort. He has had dry heaves. Currently, right now, he is pain-free after medication. He has no previous history of abdominal complaints similar to this. He has not had an upper endoscopy. Yesterday, he had a CAT scan of the abdomen and pelvis performed showing a large, nonobstructing hiatal hernia. He has a known retroperitoneal mass on the right side which is calcified and apparently has been worked up in the past. We have been asked to see him regarding the hiatal hernia and consideration for eventual repair. He reports not having had a bowel movement several days. He has had some weight loss recently because of his limited diet.  Past Medical History:  Diagnosis Date  . Arthritis   . History of hiatal hernia     Past Surgical History:  Procedure Laterality Date  . BACK SURGERY     and neck and trigger finger surgery  . CERVICAL SPINE SURGERY    . LAPAROSCOPIC APPENDECTOMY N/A 01/21/2014   Procedure: APPENDECTOMY LAPAROSCOPIC;  Surgeon: Autumn Messing III, MD;  Location: [facility];  Service: General;  Laterality: N/A;  . TRIGGER FINGER RELEASE      History reviewed. No pertinent family history.  Social History:  reports that he has quit smoking. His smoking use included Cigarettes. He has never used smokeless tobacco. He reports that he drinks alcohol. He reports that he does not use drugs.  Allergies:  Allergies  Allergen Reactions  . Codeine Anaphylaxis  . Ibuprofen Anaphylaxis  . Robaxin [Methocarbamol] Anaphylaxis, Hives and Swelling    Medications: I have reviewed the patient's current  medications.  Results for orders placed or performed during the hospital encounter of 12/23/15 (from the past 48 hour(s))  Comprehensive metabolic panel     Status: Abnormal   Collection Time: 12/23/15  8:51 AM  Result Value Ref Range   Sodium 138 135 - 145 mmol/L   Potassium 3.6 3.5 - 5.1 mmol/L   Chloride 103 101 - 111 mmol/L   CO2 24 22 - 32 mmol/L   Glucose, Bld 144 (H) 65 - 99 mg/dL   BUN 14 6 - 20 mg/dL   Creatinine, Ser 1.11 0.61 - 1.24 mg/dL   Calcium 9.5 8.9 - 10.3 mg/dL   Total Protein 6.6 6.5 - 8.1 g/dL   Albumin 4.1 3.5 - 5.0 g/dL   AST 26 15 - 41 U/L   ALT 16 (L) 17 - 63 U/L   Alkaline Phosphatase 96 38 - 126 U/L   Total Bilirubin 1.1 0.3 - 1.2 mg/dL   GFR calc non Af Amer >60 >60 mL/min   GFR calc Af Amer >60 >60 mL/min    Comment: (NOTE) The eGFR has been calculated using the CKD EPI equation. This calculation has not been validated in all clinical situations. eGFR's persistently <60 mL/min signify possible Chronic Kidney Disease.    Anion gap 11 5 - 15  CBC     Status: Abnormal   Collection Time: 12/23/15  8:51 AM  Result Value Ref Range   WBC 13.0 (H) 4.0 - 10.5 K/uL   RBC 4.78 4.22 - 5.81 MIL/uL  Hemoglobin 15.5 13.0 - 17.0 g/dL   HCT 45.6 39.0 - 52.0 %   MCV 95.4 78.0 - 100.0 fL   MCH 32.4 26.0 - 34.0 pg   MCHC 34.0 30.0 - 36.0 g/dL   RDW 12.7 11.5 - 15.5 %   Platelets 238 150 - 400 K/uL  Lipase, blood     Status: Abnormal   Collection Time: 12/23/15  8:51 AM  Result Value Ref Range   Lipase 55 (H) 11 - 51 U/L  I-stat troponin, ED     Status: None   Collection Time: 12/23/15  9:16 AM  Result Value Ref Range   Troponin i, poc 0.00 0.00 - 0.08 ng/mL   Comment 3            Comment: Due to the release kinetics of cTnI, a negative result within the first hours of the onset of symptoms does not rule out myocardial infarction with certainty. If myocardial infarction is still suspected, repeat the test at appropriate intervals.   Lactic acid,  plasma     Status: None   Collection Time: 12/23/15 12:43 PM  Result Value Ref Range   Lactic Acid, Venous 1.2 0.5 - 1.9 mmol/L  Magnesium     Status: None   Collection Time: 12/23/15  1:42 PM  Result Value Ref Range   Magnesium 1.8 1.7 - 2.4 mg/dL  Phosphorus     Status: Abnormal   Collection Time: 12/23/15  1:42 PM  Result Value Ref Range   Phosphorus 1.6 (L) 2.5 - 4.6 mg/dL    Dg Chest 2 View  Result Date: 12/22/2015 CLINICAL DATA:  Chest and upper abdomen pain starting yesterday EXAM: CHEST  2 VIEW COMPARISON:  May 08, 2007 FINDINGS: The heart size and mediastinal contours are within normal limits. There is no focal infiltrate, pulmonary edema, or pleural effusion. The visualized skeletal structures are stable. There is a large hiatal hernia. IMPRESSION: No active cardiopulmonary disease. Electronically Signed   By: Abelardo Diesel M.D.   On: 12/22/2015 11:05   Ct Abdomen Pelvis W Contrast  Result Date: 12/22/2015 CLINICAL DATA:  Epigastric pain when trying to eat or drink. Symptoms began yesterday when he ate lunch. EXAM: CT ABDOMEN AND PELVIS WITH CONTRAST TECHNIQUE: Multidetector CT imaging of the abdomen and pelvis was performed using the standard protocol following bolus administration of intravenous contrast. CONTRAST:  100 cc Isovue-300 COMPARISON:  Lumbar spine films 08/30/2010, chest x-ray 12/22/2015 FINDINGS: Lower chest: There is small amount of scarring at both lung bases. Heart size is normal. Minimal atherosclerosis at the level of the aortic valve. There is a large hiatal hernia. Hepatobiliary: Posterior to liver, superior to the right kidney, and lateral to the adrenal gland, there is a hyperdense calcified mass or collection contrast which measures 8.8 x 10.0 x 6.9 cm. Correlation with history is recommended. Has the patient had interventional procedures performed in the right upper quadrant? Pancreas: Normal in appearance. Spleen: Normal in appearance. Renal/Adrenal: The  right adrenal gland appears to be normal in appearance but is in close proximity to the collection or calcified mass described above. The left adrenal gland is normal in appearance. The kidneys are normal in appearance. There is normal excretion and enhancement bilaterally. Gastrointestinal tract: Large hiatal hernia is present. Small bowel loops are normal in appearance. Numerous colonic diverticular are present. Status post appendectomy. Reproductive/Pelvis: Urinary bladder is normal in appearance. The prostate is enlarged. Seminal vesicles are symmetric. No free pelvic fluid. Vascular/Lymphatic: There is atherosclerotic  calcification of the abdominal aorta. The lower thoracic aorta is tortuous. No retroperitoneal or mesenteric adenopathy. Musculoskeletal/Abdominal wall: Abdominal wall is unremarkable. Status post anterior fusion of the lumbar spine at L4-5 and L5-S1. Superior endplate fracture at L2 and wedge compression fracture of L1 or chronic and stable in appearance. Vacuum disc phenomenon at L3-4. Other: none IMPRESSION: 1. Calcified mass or collection of contrast measuring 10 cm in the right upper quadrant retroperitoneum. Recommend correlation with history guarding any prior interventional procedures, contrast injection. The mass has a benign appearance and is unlikely causing the patient's symptoms. 2. Large hiatal hernia. 3.  Aortic atherosclerosis. 4. Enlarged prostate. 5. Prior spinal surgery and spondylosis. Electronically Signed   By: Nolon Nations M.D.   On: 12/22/2015 15:06   Dg Chest Port 1 View  Result Date: 12/23/2015 CLINICAL DATA:  Mid chest pain. EXAM: PORTABLE CHEST 1 VIEW COMPARISON:  December 22, 2015 FINDINGS: The heart size and mediastinal contours are within normal limits. There is no focal infiltrate, pulmonary edema, or pleural effusion. Hiatal hernia is identified. Overlying soft tissue is projected over the upper chest bilaterally. The visualized skeletal structures are  stable. IMPRESSION: No active cardiopulmonary disease. Electronically Signed   By: Abelardo Diesel M.D.   On: 12/23/2015 09:08    Review of Systems  All other systems reviewed and are negative.  Blood pressure (!) 150/124, pulse 66, temperature 98.2 F (36.8 C), temperature source Oral, resp. rate 13, height 6' (1.829 m), weight 77.1 kg (170 lb), SpO2 98 %. Physical Exam  Constitutional: He is oriented to person, place, and time. He appears well-developed and well-nourished. No distress.  HENT:  Head: Normocephalic and atraumatic.  Right Ear: External ear normal.  Left Ear: External ear normal.  Nose: Nose normal.  Mouth/Throat: Oropharynx is clear and moist. No oropharyngeal exudate.  Eyes: Conjunctivae are normal. Pupils are equal, round, and reactive to light. No scleral icterus.  Neck: Normal range of motion. No tracheal deviation present.  Cardiovascular: Normal rate, regular rhythm, normal heart sounds and intact distal pulses.   Respiratory: Effort normal and breath sounds normal. No respiratory distress. He has no wheezes.  GI: Soft. He exhibits no distension. There is no tenderness.  Musculoskeletal: Normal range of motion. He exhibits no edema.  Lymphadenopathy:    He has no cervical adenopathy.  Neurological: He is alert and oriented to person, place, and time.  Skin: Skin is warm. No rash noted. No erythema.  Psychiatric: His behavior is normal. Judgment normal.    Assessment/Plan: Large, symptomatically hernia  I explained the diagnosis to him. I believe he needs a GI consultation to consider upper endoscopy to help define the anatomy and rule out any gastritis or ulcers as part of the preoperative workup. He may need an upper GI series. I do not do hiatal hernia surgery. Several of our partners do laparoscopic [facility].  Currently, if he continues to have dry heaves, he may need a nasogastric tube placed. He does not need an emergent exploratory laparotomy currently. We  will follow him with you.  Corderius Saraceni A 12/23/2015, 4:15 PM

## 2015-12-23 NOTE — ED Notes (Signed)
Attempted report x1. 

## 2015-12-23 NOTE — ED Notes (Signed)
Attempted report x 2 

## 2015-12-23 NOTE — ED Triage Notes (Signed)
PT presents today with ongoing ABD Pain from a newly DX Hiatal Hernia.. PT went to [facility] last night for treatment. Pain worse this AM.

## 2015-12-23 NOTE — H&P (Signed)
History and Physical    CLEBURN MAIOLO VFI:[id number] DOB: April 01, 1946 DOA: 12/23/2015   PCP: Irven Shelling, MD   Patient coming from/Resides with: Private residence/lives with wife  Chief Complaint: Epigastric/subxiphoid postprandial abdominal pain associated with vomiting  HPI: William Avila is a 70 y.o. male with medical history significant for remote appendectomy, known large calcified right upper quadrant retroperitoneal mass present since 2015. Patient endorses a history of mild postprandial epigastric discomfort with bloating for about 6 months. He manage this by increasing meal frequency and decreasing meal size. Over the past week has had increasing epigastric discomfort with significant colicky abdominal pain immediate postprandial and as of the past 2 days has had pain not only associated with eating but pain not definitively related to eating food. He's also had issues with clear emesis. He has not had any sensation of food getting stuck in the throat or painful swallowing. He has had alternating episodes of hot flashes and chills. He is not had any reflux symptoms. Has not had any diarrhea. He has not had any fevers. He was initially evaluated in the ER yesterday on 9/10 where a CT of the abdomen and pelvis was obtained reveals stability and known calcified right upper quadrant mass that also revealed a very large hiatal hernia. I have reviewed these findings over the phone with radiologist Dr. [place] Pink over the telephone. He reports no obstructive processes and no gastric wall edema concerning for volvulus formation. GI has been consulted by EDP and recommended upper GI series. The radiologist confirms that this test would not give any new additional information and recommended that we cancel it. He also recommended surgical consultation since this would be definitive treatment for this problem.  The patient returned to the ER today for continued pain and worsening vomiting  and inability to keep down liquid nutrition and medications prescribed.  ED Course: Vital signs: 98.4-174/106-57-20-room air 99% CT of the abdomen and pelvis with contrast obtained on 9/2: Calcified mass or collection measuring 10 cm in the right upper quadrant of the retroperitoneum-see ER note from 9/2 regarding historical significance and previous workup Laboratory data: Sodium 138, potassium 3.6, BUN 14, creatinine 1.11, glucose 144, lipase 55 down from 76 yesterday, , ALT and AST normal as his total bilirubin, troponin point-of-care 0.00, white count 13,000 differential not obtained, hemoglobin 15.5, platelets 238,000  Medications and treatments: Pepcid 20 mg IV 1, normal saline bolus 1000 mL 1, GI cocktail 30 mL 1, fentanyl 100 g IV 1  Review of systems:  In addition to the HPI above,  No Fever, myalgias or other constitutional symptoms No Headache, changes with Vision or hearing, new weakness, tingling, numbness in any extremity, No problems swallowing food or Liquids, indigestion/reflux No Cough or Shortness of Breath, palpitations, orthopnea or DOE No melena or hematochezia, no dark tarry stools No dysuria, hematuria or flank pain No new skin rashes, lesions, masses or bruises, No new joints pains-aches No recent weight gain or loss No polyuria, polydypsia or polyphagia,   History reviewed. No pertinent past medical history.  Past Surgical History:  Procedure Laterality Date  . BACK SURGERY    . CERVICAL SPINE SURGERY    . LAPAROSCOPIC APPENDECTOMY N/A 01/21/2014   Procedure: APPENDECTOMY LAPAROSCOPIC;  Surgeon: Autumn Messing III, MD;  Location: [facility];  Service: General;  Laterality: N/A;  . TRIGGER FINGER RELEASE      Social History   Social History  . Marital status: Married    Spouse name: N/A  .  Number of children: N/A  . Years of education: N/A   Occupational History  . Not on file.   Social History Main Topics  . Smoking status: Former Smoker    Types:  Cigarettes  . Smokeless tobacco: Never Used  . Alcohol use Yes     Comment: Occassionally  . Drug use: No  . Sexual activity: Not on file   Other Topics Concern  . Not on file   Social History Narrative  . No narrative on file    Mobility: Without assistive devices  Work history: retired Games developer    Allergies  Allergen Reactions  . Codeine Anaphylaxis  . Ibuprofen Anaphylaxis  . Robaxin [Methocarbamol] Anaphylaxis, Hives and Swelling     Family history reviewed and not pertinent to current admission symptomatology    Prior to Admission medications   Medication Sig Start Date End Date Taking? Authorizing Provider  acetaminophen (TYLENOL) 500 MG tablet Take 500 mg by mouth every 6 (six) hours as needed for mild pain.   Yes Historical Provider, MD  Multiple Vitamins-Minerals (MULTIVITAMIN WITH MINERALS) tablet Take 1 tablet by mouth daily.   Yes Historical Provider, MD  naproxen sodium (ALEVE) 220 MG tablet Take 440 mg by mouth 2 (two) times daily as needed (pain).   Yes Historical Provider, MD  ondansetron (ZOFRAN ODT) 8 MG disintegrating tablet Take 1 tablet (8 mg total) by mouth every 8 (eight) hours as needed for nausea or vomiting. 12/22/15  Yes Evalee Jefferson, PA-C  oxyCODONE-acetaminophen (PERCOCET/ROXICET) 5-325 MG tablet Take 1 tablet by mouth every 4 (four) hours as needed. 12/22/15  Yes Almyra Free Idol, PA-C  pantoprazole (PROTONIX) 40 MG tablet Take 1 tablet (40 mg total) by mouth daily. 12/22/15  Yes Almyra Free Idol, PA-C  sucralfate (CARAFATE) 1 g tablet Take 1 tablet (1 g total) by mouth 4 (four) times daily -  with meals and at bedtime. 12/22/15  Yes Evalee Jefferson, PA-C    Physical Exam: Vitals:   12/23/15 0900 12/23/15 1100 12/23/15 1130 12/23/15 1215  BP: (!) 174/106 (!) 177/112 (!) 156/106 (!) 158/101  Pulse: (!) 57 73 65 (!) 57  Resp:  '24 11 12  '$ Temp:      TempSrc:      SpO2: 99% 100% (!) 76% 94%  Weight:      Height:          Constitutional: NAD, calm,  Occasionally becomes uncomfortable and episodic colicky abdominal pain  Eyes: PERRL, lids and conjunctivae normal ENMT: Mucous membranes are moist. Posterior pharynx clear of any exudate or lesions.Normal dentition.  Neck: normal, supple, no masses, no thyromegaly Respiratory: clear to auscultation bilaterally, no wheezing, no crackles. Normal respiratory effort. No accessory muscle use.  Cardiovascular: Regular rate and rhythm, no murmurs / rubs / gallops. No extremity edema. 2+ pedal pulses. No carotid bruits.  Abdomen: Focally tender epigastrium and somewhat in the left upper quadrant with palpation, no masses palpated. No hepatosplenomegaly. Bowel sounds positive.  Musculoskeletal: no clubbing / cyanosis. No joint deformity upper and lower extremities. Good ROM, no contractures. Normal muscle tone.  Skin: no rashes, lesions, ulcers. No induration Neurologic: CN 2-12 grossly intact. Sensation intact, DTR normal. Strength 5/5 x all 4 extremities.  Psychiatric: Normal judgment and insight. Alert and oriented x 3. Normal mood.    Labs on Admission: I have personally reviewed following labs and imaging studies  CBC:  Recent Labs Lab 12/22/15 1015 12/23/15 0851  WBC 11.2* 13.0*  NEUTROABS 9.8*  --  HGB 15.7 15.5  HCT 44.8 45.6  MCV 93.5 95.4  PLT 252 258   Basic Metabolic Panel:  Recent Labs Lab 12/22/15 1015 12/23/15 0851  NA 139 138  K 3.5 3.6  CL 100* 103  CO2 27 24  GLUCOSE 127* 144*  BUN 13 14  CREATININE 0.91 1.11  CALCIUM 10.0 9.5   GFR: Estimated Creatinine Clearance: 67.5 mL/min (by C-G formula based on SCr of 1.11 mg/dL). Liver Function Tests:  Recent Labs Lab 12/22/15 1015 12/23/15 0851  AST 21 26  ALT 17 16*  ALKPHOS 95 96  BILITOT 0.8 1.1  PROT 6.8 6.6  ALBUMIN 4.2 4.1    Recent Labs Lab 12/22/15 1015 12/23/15 0851  LIPASE 76* 55*   No results for input(s): AMMONIA in the last 168 hours. Coagulation Profile: No results for input(s): INR,  PROTIME in the last 168 hours. Cardiac Enzymes: No results for input(s): CKTOTAL, CKMB, CKMBINDEX, TROPONINI in the last 168 hours. BNP (last 3 results) No results for input(s): PROBNP in the last 8760 hours. HbA1C: No results for input(s): HGBA1C in the last 72 hours. CBG: No results for input(s): GLUCAP in the last 168 hours. Lipid Profile: No results for input(s): CHOL, HDL, LDLCALC, TRIG, CHOLHDL, LDLDIRECT in the last 72 hours. Thyroid Function Tests: No results for input(s): TSH, T4TOTAL, FREET4, T3FREE, THYROIDAB in the last 72 hours. Anemia Panel: No results for input(s): VITAMINB12, FOLATE, FERRITIN, TIBC, IRON, RETICCTPCT in the last 72 hours. Urine analysis:    Component Value Date/Time   COLORURINE YELLOW 09/14/2010 [place] 09/14/2010 1217   LABSPEC 1.019 09/14/2010 1217   PHURINE 7.5 09/14/2010 1217   GLUCOSEU NEGATIVE 09/14/2010 1217   HGBUR NEGATIVE 09/14/2010 [place] 09/14/2010 1217   KETONESUR NEGATIVE 09/14/2010 1217   PROTEINUR NEGATIVE 09/14/2010 1217   UROBILINOGEN 0.2 09/14/2010 1217   NITRITE NEGATIVE 09/14/2010 1217   LEUKOCYTESUR  09/14/2010 1217    NEGATIVE MICROSCOPIC NOT DONE ON URINES WITH NEGATIVE PROTEIN, BLOOD, LEUKOCYTES, NITRITE, OR GLUCOSE <1000 mg/dL.   Sepsis Labs: '@LABRCNTIP'$ (procalcitonin:4,lacticidven:4) )No results found for this or any previous visit (from the past 240 hour(s)).   Radiological Exams on Admission: Dg Chest 2 View  Result Date: 12/22/2015 CLINICAL DATA:  Chest and upper abdomen pain starting yesterday EXAM: CHEST  2 VIEW COMPARISON:  May 08, 2007 FINDINGS: The heart size and mediastinal contours are within normal limits. There is no focal infiltrate, pulmonary edema, or pleural effusion. The visualized skeletal structures are stable. There is a large hiatal hernia. IMPRESSION: No active cardiopulmonary disease. Electronically Signed   By: Abelardo Diesel M.D.   On: 12/22/2015  11:05   Ct Abdomen Pelvis W Contrast  Result Date: 12/22/2015 CLINICAL DATA:  Epigastric pain when trying to eat or drink. Symptoms began yesterday when he ate lunch. EXAM: CT ABDOMEN AND PELVIS WITH CONTRAST TECHNIQUE: Multidetector CT imaging of the abdomen and pelvis was performed using the standard protocol following bolus administration of intravenous contrast. CONTRAST:  100 cc Isovue-300 COMPARISON:  Lumbar spine films 08/30/2010, chest x-ray 12/22/2015 FINDINGS: Lower chest: There is small amount of scarring at both lung bases. Heart size is normal. Minimal atherosclerosis at the level of the aortic valve. There is a large hiatal hernia. Hepatobiliary: Posterior to liver, superior to the right kidney, and lateral to the adrenal gland, there is a hyperdense calcified mass or collection contrast which measures 8.8 x 10.0 x 6.9 cm. Correlation with history is recommended.  Has the patient had interventional procedures performed in the right upper quadrant? Pancreas: Normal in appearance. Spleen: Normal in appearance. Renal/Adrenal: The right adrenal gland appears to be normal in appearance but is in close proximity to the collection or calcified mass described above. The left adrenal gland is normal in appearance. The kidneys are normal in appearance. There is normal excretion and enhancement bilaterally. Gastrointestinal tract: Large hiatal hernia is present. Small bowel loops are normal in appearance. Numerous colonic diverticular are present. Status post appendectomy. Reproductive/Pelvis: Urinary bladder is normal in appearance. The prostate is enlarged. Seminal vesicles are symmetric. No free pelvic fluid. Vascular/Lymphatic: There is atherosclerotic calcification of the abdominal aorta. The lower thoracic aorta is tortuous. No retroperitoneal or mesenteric adenopathy. Musculoskeletal/Abdominal wall: Abdominal wall is unremarkable. Status post anterior fusion of the lumbar spine at L4-5 and L5-S1.  Superior endplate fracture at L2 and wedge compression fracture of L1 or chronic and stable in appearance. Vacuum disc phenomenon at L3-4. Other: none IMPRESSION: 1. Calcified mass or collection of contrast measuring 10 cm in the right upper quadrant retroperitoneum. Recommend correlation with history guarding any prior interventional procedures, contrast injection. The mass has a benign appearance and is unlikely causing the patient's symptoms. 2. Large hiatal hernia. 3.  Aortic atherosclerosis. 4. Enlarged prostate. 5. Prior spinal surgery and spondylosis. Electronically Signed   By: Nolon Nations M.D.   On: 12/22/2015 15:06   Dg Chest Port 1 View  Result Date: 12/23/2015 CLINICAL DATA:  Mid chest pain. EXAM: PORTABLE CHEST 1 VIEW COMPARISON:  December 22, 2015 FINDINGS: The heart size and mediastinal contours are within normal limits. There is no focal infiltrate, pulmonary edema, or pleural effusion. Hiatal hernia is identified. Overlying soft tissue is projected over the upper chest bilaterally. The visualized skeletal structures are stable. IMPRESSION: No active cardiopulmonary disease. Electronically Signed   By: Abelardo Diesel M.D.   On: 12/23/2015 09:08    EKG: (Independently reviewed) sinus rhythm with mildly bradycardic rate of 59 bpm, QTC 320 ms, stable downsloping in her T-wave inversion in her lateral leads unchanged from previous EKGs,   Assessment/Plan Principal Problem:   Large Hiatal hernia with associated colicky Epigastric pain -Patient returns to hospital today for second evaluation in the ER for abdominal pain related to very large Hernia. This pain has been associated with emesis and inability to keep down even liquid foods or medications. Large hiatal hernia without evidence of volvulus formation documented on CT of the abdomen and pelvis performed 9/2 -Initially admitted as observation status but given likelihood patient will need to undergo laparoscopic hiatal hernia repair  during this hospitalization, he requires IV fluid hydration and IV pain medications I anticipate his status will be upgraded to inpatient and he will at a minimum require a 2 midnight stay -Gastroenterology consulted by EDP -I have consulted surgery and an addendum to the consultant list per their request -Normal saline IV fluids at 75 mL per hour -IV fentanyl for pain -IV Zofran for any nausea -I am allowing sips of clear liquids and boost protein drink; patient has been instructed to take oral medications and fluids very slowly to allow for gravity to assist in moving ingested products through the GI tract -IV Pepcid twice a day -Carafate elixir -Currently abdomen soft and nondistended and vomiting has ceased; if vomits with POs only to make nothing by mouth again and surgery suggested patient may benefit from NG tube for intractable emesis -Lipase is elevated but trending downward and I suspect  this subtle increase is more reflective of patient's ongoing emesis  Active Problems:   Dehydration -IV fluids as above -Follow electrolytes    Leukocytosis -Likely reflective of dehydration -Given colicky abdominal pain we'll check lactic acid -Follow electrolytes    Protein calorie malnutrition -Continue sips of clears and boost protein formula as long as emesis or abdominal pain does not occur -Check magnesium and phosphorus **Phosphorous 1.6 so have given 30 mmol potassium phosphate and will repeat phosphorus level in a.m. -Or symptoms less than one week and no weight loss reported    Retroperitoneal mass-large calcified mass, present since 2015 -Has been previously evaluated by Dr. Donne Hazel in the outpatient setting (see ER documentation from 9/2 regarding details)      DVT prophylaxis: Lovenox   Code Status: full   Family Communication:  wife at bedside, Gid Schoffstall  [phone] Disposition Plan: Anticipate discharge back to preadmission home environment once medically  stable  Consults called:  Gastroenterology /Dr. Alessandra Bevels; General Surgery /Dr. Rush Farmer  Admission status:  observation/medical floor with low threshold for transitioning to inpatient status     Jerlean Peralta L. ANP-BC [facility] Pager [phone]   If 7PM-7AM, please contact night-coverage [email or web address] Password [email or web address]  12/23/2015, 12:40 PM

## 2015-12-23 NOTE — ED Notes (Signed)
Pt did not tolerate PO well. Pt's pain increased in severity. Notified MD

## 2015-12-24 ENCOUNTER — Encounter (HOSPITAL_COMMUNITY): Payer: Self-pay | Admitting: *Deleted

## 2015-12-24 ENCOUNTER — Encounter (HOSPITAL_COMMUNITY)
Admission: EM | Disposition: A | Discharge status: Home / Self Care | Payer: Self-pay | Source: Home / Self Care | Attending: Internal Medicine

## 2015-12-24 ENCOUNTER — Observation Stay (HOSPITAL_COMMUNITY): Payer: Medicare HMO | Admitting: Anesthesiology

## 2015-12-24 DIAGNOSIS — D72829 Elevated white blood cell count, unspecified: Secondary | ICD-10-CM

## 2015-12-24 DIAGNOSIS — E278 Other specified disorders of adrenal gland: Secondary | ICD-10-CM | POA: Diagnosis not present

## 2015-12-24 DIAGNOSIS — R739 Hyperglycemia, unspecified: Secondary | ICD-10-CM | POA: Diagnosis not present

## 2015-12-24 DIAGNOSIS — Z885 Allergy status to narcotic agent status: Secondary | ICD-10-CM | POA: Diagnosis not present

## 2015-12-24 DIAGNOSIS — K449 Diaphragmatic hernia without obstruction or gangrene: Secondary | ICD-10-CM | POA: Diagnosis not present

## 2015-12-24 DIAGNOSIS — R1013 Epigastric pain: Secondary | ICD-10-CM

## 2015-12-24 DIAGNOSIS — R1901 Right upper quadrant abdominal swelling, mass and lump: Secondary | ICD-10-CM | POA: Diagnosis not present

## 2015-12-24 DIAGNOSIS — E860 Dehydration: Secondary | ICD-10-CM | POA: Diagnosis not present

## 2015-12-24 DIAGNOSIS — K209 Esophagitis, unspecified: Secondary | ICD-10-CM | POA: Diagnosis not present

## 2015-12-24 DIAGNOSIS — E2749 Other adrenocortical insufficiency: Secondary | ICD-10-CM | POA: Diagnosis not present

## 2015-12-24 DIAGNOSIS — R933 Abnormal findings on diagnostic imaging of other parts of digestive tract: Secondary | ICD-10-CM | POA: Diagnosis not present

## 2015-12-24 DIAGNOSIS — K440 Diaphragmatic hernia with obstruction, without gangrene: Secondary | ICD-10-CM | POA: Diagnosis not present

## 2015-12-24 HISTORY — PX: ESOPHAGOGASTRODUODENOSCOPY (EGD) WITH PROPOFOL: SHX5813

## 2015-12-24 LAB — TSH: TSH: 2.014 u[IU]/mL (ref 0.350–4.500)

## 2015-12-24 LAB — COMPREHENSIVE METABOLIC PANEL
ALT: 14 U/L — ABNORMAL LOW (ref 17–63)
AST: 19 U/L (ref 15–41)
Albumin: 3.6 g/dL (ref 3.5–5.0)
Alkaline Phosphatase: 79 U/L (ref 38–126)
Anion gap: 5 (ref 5–15)
BUN: 12 mg/dL (ref 6–20)
CO2: 27 mmol/L (ref 22–32)
Calcium: 8.8 mg/dL — ABNORMAL LOW (ref 8.9–10.3)
Chloride: 106 mmol/L (ref 101–111)
Creatinine, Ser: 0.88 mg/dL (ref 0.61–1.24)
GFR calc Af Amer: >60 mL/min (ref >60)
GFR calc non Af Amer: >60 mL/min (ref >60)
Glucose, Bld: 115 mg/dL — ABNORMAL HIGH (ref 65–99)
Potassium: 3.8 mmol/L (ref 3.5–5.1)
Sodium: 138 mmol/L (ref 135–145)
Total Bilirubin: 0.6 mg/dL (ref 0.3–1.2)
Total Protein: 5.8 g/dL — ABNORMAL LOW (ref 6.5–8.1)

## 2015-12-24 LAB — CBC
HCT: 41.1 % (ref 39.0–52.0)
Hemoglobin: 13.8 g/dL (ref 13.0–17.0)
MCH: 32.3 pg (ref 26.0–34.0)
MCHC: 33.6 g/dL (ref 30.0–36.0)
MCV: 96.3 fL (ref 78.0–100.0)
Platelets: 220 10*3/uL (ref 150–400)
RBC: 4.27 MIL/uL (ref 4.22–5.81)
RDW: 13 % (ref 11.5–15.5)
WBC: 12.5 10*3/uL — ABNORMAL HIGH (ref 4.0–10.5)

## 2015-12-24 LAB — PHOSPHORUS: Phosphorus: 3.2 mg/dL (ref 2.5–4.6)

## 2015-12-24 SURGERY — ESOPHAGOGASTRODUODENOSCOPY (EGD) WITH PROPOFOL
Anesthesia: Monitor Anesthesia Care | Laterality: Left

## 2015-12-24 MED ORDER — CHLORPROMAZINE HCL 25 MG/ML IJ SOLN
25.0000 mg | Freq: Four times a day (QID) | INTRAMUSCULAR | Status: DC | PRN
  Administered 2015-12-24 – 2015-12-28 (×2): 25 mg via INTRAVENOUS
  Filled 2015-12-24 (×6): qty 1

## 2015-12-24 MED ORDER — BUTAMBEN-TETRACAINE-BENZOCAINE 2-2-14 % EX AERO
INHALATION_SPRAY | CUTANEOUS | Status: DC | PRN
  Administered 2015-12-24: 2 via TOPICAL

## 2015-12-24 MED ORDER — LACTATED RINGERS IV SOLN
INTRAVENOUS | Status: DC
  Administered 2015-12-24: 1000 mL via INTRAVENOUS

## 2015-12-24 MED ORDER — PROPOFOL 10 MG/ML IV BOLUS
INTRAVENOUS | Status: DC | PRN
  Administered 2015-12-24 (×3): 20 mg via INTRAVENOUS

## 2015-12-24 MED ORDER — LACTATED RINGERS IV SOLN
INTRAVENOUS | Status: DC | PRN
  Administered 2015-12-24: 10:00:00 via INTRAVENOUS

## 2015-12-24 MED ORDER — PROPOFOL 500 MG/50ML IV EMUL
INTRAVENOUS | Status: DC | PRN
  Administered 2015-12-24: 100 ug/kg/min via INTRAVENOUS

## 2015-12-24 NOTE — Transfer of Care (Signed)
Immediate Anesthesia Transfer of Care Note  Patient: William Avila  Procedure(s) Performed: Procedure(s): ESOPHAGOGASTRODUODENOSCOPY (EGD) WITH PROPOFOL (Left)  Patient Location: Endoscopy Unit  Anesthesia Type:MAC  Level of Consciousness: awake, oriented and patient cooperative  Airway & Oxygen Therapy: Patient Spontanous Breathing and Patient connected to nasal cannula oxygen  Post-op Assessment: Report given to RN, Post -op Vital signs reviewed and stable and Patient moving all extremities  Post vital signs: Reviewed and stable  Last Vitals:  Vitals:   12/24/15 0504 12/24/15 0926  BP: (!) 152/96 (!) 188/113  Pulse: (!) 48   Resp: 16 (!) 21  Temp: 37.1 C     Last Pain:  Vitals:   12/24/15 0926  TempSrc:   PainSc: 6       Patients Stated Pain Goal: 1 (48/01/65 [id number])  Complications: No apparent anesthesia complications

## 2015-12-24 NOTE — Progress Notes (Signed)
Case discussed with surgical attending Dr. Dalbert Batman. Patient will require possible surgical intervention. GI will sign off. Call us back if needed Follow up in GI clinic in 8 weeks.

## 2015-12-24 NOTE — Progress Notes (Signed)
[facility]                                                                              Patient Demographics  William Avila, is a 70 y.o. male, DOB - January 27, 1946, William Avila  Admit date - 12/23/2015   Admitting Physician Courage Denton Brick, MD  Outpatient Primary MD for the patient is Irven Shelling, MD  Outpatient specialists:   LOS - 0  days    Chief Complaint  Patient presents with  . Abdominal Pain       Brief summary   William Avila is a 70 y.o. male with medical history significant for remote appendectomy, known large calcified right upper quadrant retroperitoneal mass present since 2015. Patient reported mild postprandial epigastric discomfort with bloating for about 6 months. He managed this by increasing meal frequency and decreasing meal size. Over the past week has had increasing epigastric discomfort with significant colicky abdominal pain immediate postprandial and as of the past 2 days has had pain not only associated with eating but pain not definitively related to eating food. Patient had CT abdomen and pelvis on 9/2 which showed very large hiatal hernia.Calcified mass or collection measuring 10 cm in the right upper quadrant of the retroperitoneum Patient was admitted for further workup  Assessment & Plan     Large Hiatal hernia with associated colicky Epigastric pain -Patient reported abdominal pain and emesis, inability to keep down even liquid foods or medications. Large hiatal hernia without evidence of volvulus formation documented on CT of the abdomen and pelvis performed 9/2 - Cont NPO status, IV fluids, IV pain medications as needed  - GI consulted, EGD today, general surgery also consulted, but following recommendations.  - Continue PPI  Active Problems:   Dehydration -IV fluids as above    Leukocytosis -Likely reflective of dehydration. Lactic acid normal.    Protein calorie malnutrition, hypophosphatemia -  Repleted phosphorous, now level close to normal    Retroperitoneal mass-large calcified mass, present since 2015 -Has been previously evaluated by Dr. Donne Hazel in the outpatient setting (see ER documentation from 9/2 regarding details)   Code Status: full  DVT Prophylaxis: Lovenox Family Communication: Discussed in detail with the patient, all imaging results, lab results explained to the patient  And wife   Disposition Plan: none   Time Spent in minutes   25 minutes  Procedures:  EGD today  Consultants:   GI General surgery   Antimicrobials:      Medications  Scheduled Meds: . enoxaparin (LOVENOX) injection  40 mg Subcutaneous Q24H  . famotidine (PEPCID) IV  20 mg Intravenous Q12H  . feeding supplement  1 Container Oral TID BM  . folic acid  1 mg Intravenous Daily  . [START ON 12/27/2015] pantoprazole  40 mg Intravenous Q12H  . sucralfate  1 g Oral TID WC & HS  . thiamine  100 mg Intravenous Daily   Continuous Infusions: . sodium chloride 75 mL/hr at 12/24/15 0150   PRN Meds:.acetaminophen **OR** acetaminophen, chlorproMAZINE (THORAZINE) IV, fentaNYL (SUBLIMAZE) injection, ondansetron (ZOFRAN) IV   Antibiotics   Anti-infectives    None  Subjective:   William Avila was seen and examined today.Complaining of hiccups and reflux symptoms, epigastric pain. No vomiting today.  No chest pain, fevers or chills.  No acute events overnight.    Objective:   Vitals:   12/24/15 1032 12/24/15 1040 12/24/15 1050 12/24/15 1057  BP: 128/77 140/83 136/85   Pulse: 60 (!) 57 (!) 58 (!) 54  Resp: 18 14 (!) 23 15  Temp:      TempSrc:      SpO2: 98% 95% 95% 95%  Weight:      Height:        Intake/Output Summary (Last 24 hours) at 12/24/15 1140 Last data filed at 12/24/15 1023  Gross per 24 hour  Intake           1177.5 ml  Output              900 ml  Net            277.5 ml     Wt Readings from Last 3 Encounters:  12/23/15 77.1 kg (170 lb)    12/22/15 77.1 kg (170 lb)  02/11/15 81.6 kg (180 lb)     Exam  General: Alert and oriented x 3, NAD  HEENT:    Neck: Supple, no JVD, no masses  Cardiovascular: S1 S2 auscultated, no rubs, murmurs or gallops. Regular rate and rhythm.  Respiratory: Clear to auscultation bilaterally, no wheezing, rales or rhonchi  Gastrointestinal: Soft, Minimally tender in the upper abdomen, nondistended, + bowel sounds  Ext: no cyanosis clubbing or edema  Neuro: AAOx3, Cr N's II- XII. Strength 5/5 upper and lower extremities bilaterally  Skin: No rashes  Psych: Normal affect and demeanor, alert and oriented x3    Data Reviewed:  I have personally reviewed following labs and imaging studies  Micro Results No results found for this or any previous visit (from the past 240 hour(s)).  Radiology Reports Dg Chest 2 View  Result Date: 12/22/2015 CLINICAL DATA:  Chest and upper abdomen pain starting yesterday EXAM: CHEST  2 VIEW COMPARISON:  May 08, 2007 FINDINGS: The heart size and mediastinal contours are within normal limits. There is no focal infiltrate, pulmonary edema, or pleural effusion. The visualized skeletal structures are stable. There is a large hiatal hernia. IMPRESSION: No active cardiopulmonary disease. Electronically Signed   By: Abelardo Diesel M.D.   On: 12/22/2015 11:05   Ct Abdomen Pelvis W Contrast  Result Date: 12/22/2015 CLINICAL DATA:  Epigastric pain when trying to eat or drink. Symptoms began yesterday when he ate lunch. EXAM: CT ABDOMEN AND PELVIS WITH CONTRAST TECHNIQUE: Multidetector CT imaging of the abdomen and pelvis was performed using the standard protocol following bolus administration of intravenous contrast. CONTRAST:  100 cc Isovue-300 COMPARISON:  Lumbar spine films 08/30/2010, chest x-ray 12/22/2015 FINDINGS: Lower chest: There is small amount of scarring at both lung bases. Heart size is normal. Minimal atherosclerosis at the level of the aortic valve. There  is a large hiatal hernia. Hepatobiliary: Posterior to liver, superior to the right kidney, and lateral to the adrenal gland, there is a hyperdense calcified mass or collection contrast which measures 8.8 x 10.0 x 6.9 cm. Correlation with history is recommended. Has the patient had interventional procedures performed in the right upper quadrant? Pancreas: Normal in appearance. Spleen: Normal in appearance. Renal/Adrenal: The right adrenal gland appears to be normal in appearance but is in close proximity to the collection or calcified mass described above. The left adrenal gland  is normal in appearance. The kidneys are normal in appearance. There is normal excretion and enhancement bilaterally. Gastrointestinal tract: Large hiatal hernia is present. Small bowel loops are normal in appearance. Numerous colonic diverticular are present. Status post appendectomy. Reproductive/Pelvis: Urinary bladder is normal in appearance. The prostate is enlarged. Seminal vesicles are symmetric. No free pelvic fluid. Vascular/Lymphatic: There is atherosclerotic calcification of the abdominal aorta. The lower thoracic aorta is tortuous. No retroperitoneal or mesenteric adenopathy. Musculoskeletal/Abdominal wall: Abdominal wall is unremarkable. Status post anterior fusion of the lumbar spine at L4-5 and L5-S1. Superior endplate fracture at L2 and wedge compression fracture of L1 or chronic and stable in appearance. Vacuum disc phenomenon at L3-4. Other: none IMPRESSION: 1. Calcified mass or collection of contrast measuring 10 cm in the right upper quadrant retroperitoneum. Recommend correlation with history guarding any prior interventional procedures, contrast injection. The mass has a benign appearance and is unlikely causing the patient's symptoms. 2. Large hiatal hernia. 3.  Aortic atherosclerosis. 4. Enlarged prostate. 5. Prior spinal surgery and spondylosis. Electronically Signed   By: Nolon Nations M.D.   On: 12/22/2015 15:06     Dg Chest Port 1 View  Result Date: 12/23/2015 CLINICAL DATA:  Mid chest pain. EXAM: PORTABLE CHEST 1 VIEW COMPARISON:  December 22, 2015 FINDINGS: The heart size and mediastinal contours are within normal limits. There is no focal infiltrate, pulmonary edema, or pleural effusion. Hiatal hernia is identified. Overlying soft tissue is projected over the upper chest bilaterally. The visualized skeletal structures are stable. IMPRESSION: No active cardiopulmonary disease. Electronically Signed   By: Abelardo Diesel M.D.   On: 12/23/2015 09:08    Lab Data:  CBC:  Recent Labs Lab 12/22/15 1015 12/23/15 0851 12/24/15 0234  WBC 11.2* 13.0* 12.5*  NEUTROABS 9.8*  --   --   HGB 15.7 15.5 13.8  HCT 44.8 45.6 41.1  MCV 93.5 95.4 96.3  PLT 252 238 161   Basic Metabolic Panel:  Recent Labs Lab 12/22/15 1015 12/23/15 0851 12/23/15 1342 12/24/15 0234  NA 139 138  --  138  K 3.5 3.6  --  3.8  CL 100* 103  --  106  CO2 27 24  --  27  GLUCOSE 127* 144*  --  115*  BUN 13 14  --  12  CREATININE 0.91 1.11  --  0.88  CALCIUM 10.0 9.5  --  8.8*  MG  --   --  1.8  --   PHOS  --   --  1.6* 3.2   GFR: Estimated Creatinine Clearance: 85.2 mL/min (by C-G formula based on SCr of 0.88 mg/dL). Liver Function Tests:  Recent Labs Lab 12/22/15 1015 12/23/15 0851 12/24/15 0234  AST '21 26 19  '$ ALT 17 16* 14*  ALKPHOS 95 96 79  BILITOT 0.8 1.1 0.6  PROT 6.8 6.6 5.8*  ALBUMIN 4.2 4.1 3.6    Recent Labs Lab 12/22/15 1015 12/23/15 0851  LIPASE 76* 55*   No results for input(s): AMMONIA in the last 168 hours. Coagulation Profile: No results for input(s): INR, PROTIME in the last 168 hours. Cardiac Enzymes: No results for input(s): CKTOTAL, CKMB, CKMBINDEX, TROPONINI in the last 168 hours. BNP (last 3 results) No results for input(s): PROBNP in the last 8760 hours. HbA1C: No results for input(s): HGBA1C in the last 72 hours. CBG: No results for input(s): GLUCAP in the last 168  hours. Lipid Profile: No results for input(s): CHOL, HDL, LDLCALC, TRIG, CHOLHDL, LDLDIRECT in the  last 72 hours. Thyroid Function Tests:  Recent Labs  12/24/15 0234  TSH 2.014   Anemia Panel: No results for input(s): VITAMINB12, FOLATE, FERRITIN, TIBC, IRON, RETICCTPCT in the last 72 hours. Urine analysis:    Component Value Date/Time   COLORURINE YELLOW 09/14/2010 [place] 09/14/2010 1217   LABSPEC 1.019 09/14/2010 1217   PHURINE 7.5 09/14/2010 1217   GLUCOSEU NEGATIVE 09/14/2010 1217   HGBUR NEGATIVE 09/14/2010 [place] 09/14/2010 1217   KETONESUR NEGATIVE 09/14/2010 1217   PROTEINUR NEGATIVE 09/14/2010 1217   UROBILINOGEN 0.2 09/14/2010 1217   NITRITE NEGATIVE 09/14/2010 1217   LEUKOCYTESUR  09/14/2010 1217    NEGATIVE MICROSCOPIC NOT DONE ON URINES WITH NEGATIVE PROTEIN, BLOOD, LEUKOCYTES, NITRITE, OR GLUCOSE <1000 mg/dL.     RAI,RIPUDEEP M.D. [facility] 12/24/2015, 11:40 AM  Pager: [phone] Between 7am to 7pm - call Pager - [phone]  After 7pm go to [email or web address] - password [email or web address]  Call night coverage person covering after 7pm

## 2015-12-24 NOTE — Progress Notes (Signed)
[facility] Progress Note  William Avila 70 y.o. [birth year]   Subjective: Patient continues to have ongoing epigastric/substernal pain and nausea.  Objective: Vital signs in last 24 hours: Vitals:   12/24/15 0504 12/24/15 0926  BP: (!) 152/96 (!) 188/113  Pulse: (!) 48   Resp: 16 (!) 21  Temp: 98.7 F (37.1 C)     Physical Exam:  General:  Alert, cooperative, no distress, appears stated age  Head:  Normocephalic, without obvious abnormality, atraumatic  Eyes:  , EOM's intact,   Lungs:   Clear to auscultation bilaterally, respirations unlabored  Heart:  Regular rate and rhythm, S1, S2 normal  Abdomen:   Epigastric tenderness to palpation, soft, nondistended, bowel sounds present,   Extremities: Extremities normal, atraumatic, no  edema  Pulses: 2+ and symmetric    Lab Results:  Recent Labs  12/23/15 0851 12/23/15 1342 12/24/15 0234  NA 138  --  138  K 3.6  --  3.8  CL 103  --  106  CO2 24  --  27  GLUCOSE 144*  --  115*  BUN 14  --  12  CREATININE 1.11  --  0.88  CALCIUM 9.5  --  8.8*  MG  --  1.8  --   PHOS  --  1.6* 3.2    Recent Labs  12/23/15 0851 12/24/15 0234  AST 26 19  ALT 16* 14*  ALKPHOS 96 79  BILITOT 1.1 0.6  PROT 6.6 5.8*  ALBUMIN 4.1 3.6    Recent Labs  12/22/15 1015 12/23/15 0851 12/24/15 0234  WBC 11.2* 13.0* 12.5*  NEUTROABS 9.8*  --   --   HGB 15.7 15.5 13.8  HCT 44.8 45.6 41.1  MCV 93.5 95.4 96.3  PLT 252 238 220   No results for input(s): LABPROT, INR in the last 72 hours.    Assessment/Plan: Severe epigastric pain with CT scan showing large hiatal hernia. Need to rule out ulcer disease.  Recommendations ------------------------- - EGD todayfor further evaluation. Risk, benefits and alternatives discussed with the patient. Verbalized understanding. - Follow surgery recommendations - IV BID PPI - Patient is due for his colonoscopy for colorectal cancer screening.   [name] 12/24/2015,  9:41 AM  Pager [phone]  If no answer or after 5 PM call [phone]

## 2015-12-24 NOTE — Anesthesia Preprocedure Evaluation (Addendum)
Anesthesia Evaluation  Patient identified by MRN, date of birth, ID band Patient awake    Reviewed: Allergy & Precautions, NPO status , Patient's Chart, lab work & pertinent test results  History of Anesthesia Complications Negative for: history of anesthetic complications  Airway Mallampati: II  TM Distance: >3 FB Neck ROM: Full    Dental  (+) Poor Dentition, Dental Advisory Given   Pulmonary former smoker,    Pulmonary exam normal        Cardiovascular negative cardio ROS Normal cardiovascular exam     Neuro/Psych negative neurological ROS  negative psych ROS   GI/Hepatic Neg liver ROS, hiatal hernia,   Endo/Other  negative endocrine ROS  Renal/GU negative Renal ROS     Musculoskeletal   Abdominal   Peds  Hematology   Anesthesia Other Findings   Reproductive/Obstetrics                            Anesthesia Physical Anesthesia Plan  ASA: III  Anesthesia Plan: MAC   Post-op Pain Management:    Induction: Intravenous  Airway Management Planned: Simple Face Mask and Natural Airway  Additional Equipment:   Intra-op Plan:   Post-operative Plan:   Informed Consent: I have reviewed the patients History and Physical, chart, labs and discussed the procedure including the risks, benefits and alternatives for the proposed anesthesia with the patient or authorized representative who has indicated his/her understanding and acceptance.   Dental advisory given  Plan Discussed with: CRNA and Anesthesiologist  Anesthesia Plan Comments:        Anesthesia Quick Evaluation

## 2015-12-24 NOTE — Anesthesia Postprocedure Evaluation (Signed)
Anesthesia Post Note  Patient: ELMIN WIEDERHOLT  Procedure(s) Performed: Procedure(s) (LRB): ESOPHAGOGASTRODUODENOSCOPY (EGD) WITH PROPOFOL (Left)  Patient location during evaluation: PACU Anesthesia Type: MAC Level of consciousness: awake and alert Pain management: pain level controlled Vital Signs Assessment: post-procedure vital signs reviewed and stable Respiratory status: spontaneous breathing and respiratory function stable Cardiovascular status: stable Anesthetic complications: no    Last Vitals:  Vitals:   12/24/15 1050 12/24/15 1057  BP: 136/85   Pulse: (!) 58 (!) 54  Resp: (!) 23 15  Temp:      Last Pain:  Vitals:   12/24/15 1032  TempSrc:   PainSc: 0-No pain                 Daryn Hicks DANIEL

## 2015-12-24 NOTE — Progress Notes (Signed)
Subjective: Pleasant and cooperative.  Wife in room. He is nothing by mouth and waiting for upper endoscopy today  He has had reflux symptoms for several months but did not seek medical attention He's been having lower substernal and epigastric pain which occurs during meals for the past 4 days. Doesn't vomit food except for one time on Saturday Drank a nutritional supplement yesterday, very slowly, kept it down  CT shows what looks like a sliding hiatal hernia, perhaps 50% of stomach up in chest.  The contrast goes right on through into the lower GI tract, however.  No signs of perforation or inflammation.  Chronic calcified right adrenal mass which has been followed by [name] for some time.  Has had laparoscopic appendectomy.  Also has had lumbar surgery by Dr. Rolena Infante.  Objective: Vital signs in last 24 hours: Temp:  [98.1 F (36.7 C)-98.9 F (37.2 C)] 98.7 F (37.1 C) (09/04 0504) Pulse Rate:  [48-73] 48 (09/04 0504) Resp:  [11-24] 16 (09/04 0504) BP: (150-177)/(94-124) 152/96 (09/04 0504) SpO2:  [76 %-100 %] 96 % (09/04 0504) Last BM Date: 12/22/15  Intake/Output from previous day: 09/03 0701 - 09/04 0700 In: 827.5 [I.V.:777.5; IV Piggyback:50] Out: 500 [Urine:500] Intake/Output this shift: Total I/O In: 0  Out: 400 [Urine:400]  General appearance: Alert.  Very pleasant and cooperative.  Seems to be in mild distress from epigastric discomfort. Resp: clear to auscultation bilaterally GI: Abdomen is soft.  Nondistended.  Minimally tender in the upper abdomen.  Mostly subjective.  No guarding.  No mass.  Laparoscopic trocar sites well-healed.  Lower midline incision for lumbar surgery well-healed.  Lab Results:   Recent Labs  12/23/15 0851 12/24/15 0234  WBC 13.0* 12.5*  HGB 15.5 13.8  HCT 45.6 41.1  PLT 238 220   BMET  Recent Labs  12/23/15 0851 12/24/15 0234  NA 138 138  K 3.6 3.8  CL 103 106  CO2 24 27  GLUCOSE 144* 115*  BUN 14 12   CREATININE 1.11 0.88  CALCIUM 9.5 8.8*   PT/INR No results for input(s): LABPROT, INR in the last 72 hours. ABG No results for input(s): PHART, HCO3 in the last 72 hours.  Invalid input(s): PCO2, PO2  Studies/Results: Dg Chest 2 View  Result Date: 12/22/2015 CLINICAL DATA:  Chest and upper abdomen pain starting yesterday EXAM: CHEST  2 VIEW COMPARISON:  May 08, 2007 FINDINGS: The heart size and mediastinal contours are within normal limits. There is no focal infiltrate, pulmonary edema, or pleural effusion. The visualized skeletal structures are stable. There is a large hiatal hernia. IMPRESSION: No active cardiopulmonary disease. Electronically Signed   By: Abelardo Diesel M.D.   On: 12/22/2015 11:05   Ct Abdomen Pelvis W Contrast  Result Date: 12/22/2015 CLINICAL DATA:  Epigastric pain when trying to eat or drink. Symptoms began yesterday when he ate lunch. EXAM: CT ABDOMEN AND PELVIS WITH CONTRAST TECHNIQUE: Multidetector CT imaging of the abdomen and pelvis was performed using the standard protocol following bolus administration of intravenous contrast. CONTRAST:  100 cc Isovue-300 COMPARISON:  Lumbar spine films 08/30/2010, chest x-ray 12/22/2015 FINDINGS: Lower chest: There is small amount of scarring at both lung bases. Heart size is normal. Minimal atherosclerosis at the level of the aortic valve. There is a large hiatal hernia. Hepatobiliary: Posterior to liver, superior to the right kidney, and lateral to the adrenal gland, there is a hyperdense calcified mass or collection contrast which measures 8.8 x 10.0 x 6.9 cm. Correlation  with history is recommended. Has the patient had interventional procedures performed in the right upper quadrant? Pancreas: Normal in appearance. Spleen: Normal in appearance. Renal/Adrenal: The right adrenal gland appears to be normal in appearance but is in close proximity to the collection or calcified mass described above. The left adrenal gland is normal in  appearance. The kidneys are normal in appearance. There is normal excretion and enhancement bilaterally. Gastrointestinal tract: Large hiatal hernia is present. Small bowel loops are normal in appearance. Numerous colonic diverticular are present. Status post appendectomy. Reproductive/Pelvis: Urinary bladder is normal in appearance. The prostate is enlarged. Seminal vesicles are symmetric. No free pelvic fluid. Vascular/Lymphatic: There is atherosclerotic calcification of the abdominal aorta. The lower thoracic aorta is tortuous. No retroperitoneal or mesenteric adenopathy. Musculoskeletal/Abdominal wall: Abdominal wall is unremarkable. Status post anterior fusion of the lumbar spine at L4-5 and L5-S1. Superior endplate fracture at L2 and wedge compression fracture of L1 or chronic and stable in appearance. Vacuum disc phenomenon at L3-4. Other: none IMPRESSION: 1. Calcified mass or collection of contrast measuring 10 cm in the right upper quadrant retroperitoneum. Recommend correlation with history guarding any prior interventional procedures, contrast injection. The mass has a benign appearance and is unlikely causing the patient's symptoms. 2. Large hiatal hernia. 3.  Aortic atherosclerosis. 4. Enlarged prostate. 5. Prior spinal surgery and spondylosis. Electronically Signed   By: Nolon Nations M.D.   On: 12/22/2015 15:06   Dg Chest Port 1 View  Result Date: 12/23/2015 CLINICAL DATA:  Mid chest pain. EXAM: PORTABLE CHEST 1 VIEW COMPARISON:  December 22, 2015 FINDINGS: The heart size and mediastinal contours are within normal limits. There is no focal infiltrate, pulmonary edema, or pleural effusion. Hiatal hernia is identified. Overlying soft tissue is projected over the upper chest bilaterally. The visualized skeletal structures are stable. IMPRESSION: No active cardiopulmonary disease. Electronically Signed   By: Abelardo Diesel M.D.   On: 12/23/2015 09:08    Anti-infectives: Anti-infectives    None       Assessment/Plan: s/p Procedure(s): ESOPHAGOGASTRODUODENOSCOPY (EGD) WITH PROPOFOL  Hiatal hernia.  This looks like a type I sliding hiatal hernia by CT scan.  Never reported or worked up in total this weekend.   Hard to be more definitive about anatomy and type of hernia without a barium study. It does not appear obstructed radiographically, although some of his symptoms sound obstructive. Needs EGD to make sure that there is no ischemia or twisting.  EGD will also rule out ulcer or neoplasia. Suspect he will ultimately need hiatal hernia repair but doesn't appear to be emergent at this time.  Calcified right adrenal mass.  Chronic.  Followed by Dr. [name]. History laparoscopic appendectomy History lumbar surgery by Dr. Rolena Infante   LOS: 0 days    Adin Hector 12/24/2015

## 2015-12-24 NOTE — Anesthesia Procedure Notes (Signed)
Procedure Name: MAC Date/Time: 12/24/2015 10:00 AM Performed by: Melina Copa, Demont Linford R Pre-anesthesia Checklist: Patient identified, Emergency Drugs available, Suction available, Patient being monitored and Timeout performed Patient Re-evaluated:Patient Re-evaluated prior to inductionOxygen Delivery Method: Nasal cannula Placement Confirmation: positive ETCO2

## 2015-12-24 NOTE — Op Note (Signed)
[facility] Patient Name: William Avila Procedure Date : 12/24/2015 MRN: [id number] Attending MD: Otis Brace , MD Date of Birth: 1945/11/12 CSN: [id number] Age: 70 Admit Type: Inpatient Procedure:                Upper GI endoscopy Indications:              Epigastric abdominal pain, Abnormal CT of the GI                            tract Providers:                Otis Brace, MD, Dustin Flock RN, RN, Ralene Bathe, Technician Referring MD:              Medicines:                See the Anesthesia note for documentation of the                            administered medications Complications:            No immediate complications. Estimated Blood Loss:     Estimated blood loss: none. Procedure:                Pre-Anesthesia Assessment:                           - Prior to the procedure, a History and Physical                            was performed, and patient medications and                            allergies were reviewed. The patient's tolerance of                            previous anesthesia was also reviewed. The risks                            and benefits of the procedure and the sedation                            options and risks were discussed with the patient.                            All questions were answered, and informed consent                            was obtained. Prior Anticoagulants: The patient has                            taken no previous anticoagulant or antiplatelet                            agents.  ASA Grade Assessment: II - A patient with                            mild systemic disease. After reviewing the risks                            and benefits, the patient was deemed in                            satisfactory condition to undergo the procedure.                           After obtaining informed consent, the endoscope was                            passed under direct vision.  Throughout the                            procedure, the patient's blood pressure, pulse, and                            oxygen saturations were monitored continuously. The                            EG-[id number] ([id number]) scope was introduced through the                            mouth, and advanced to the second part of duodenum.                            The upper GI endoscopy was accomplished with ease.                            The patient tolerated the procedure well. Scope In: Scope Out: Findings:      A large hiatal hernia was found. [proximal extent gastric folds].       [hiatal narrowing location]. [z-line location].      A large type-III paraesophageal hernia was found. The proximal extent of       the gastric folds (end of tubular esophagus) was 30 cm from the       incisors. patient had his gastric cavity divided into 3 parts. One part       appears to be below the diaphragmatic region which contained significant       amount of fluid which was suctioned. There was another portion above the       GE junction with small amount of retained fluid which was suctioned.       opening to antrum was also located above the GE junction.this part was       completely normal without any food material.      A large type-III paraesophageal hernia was found.      LA Grade C (one or more mucosal breaks continuous between tops of 2 or       more mucosal folds, less than 75% circumference) esophagitis with no       bleeding was  found.      Excessive fluid was found in the gastric fundus and in the gastric body.      The examined duodenum was normal. Impression:               - Large hiatal hernia.                           - Large paraesophageal hernia.                           - Large paraesophageal hernia.                           - LA Grade D esophagitis.                           - Excessive gastric fluid.                           - Normal examined duodenum.                            - No specimens collected. Moderate Sedation:      Moderate (conscious) sedation was personally administered by an       anesthesia professional. The following parameters were monitored: oxygen       saturation, heart rate, blood pressure, and response to care. Recommendation:           - Return patient to hospital ward for ongoing care.                           - NPO.                           - Continue present medications.                           - Repeat upper endoscopy after 2-3 months to check                            healing. Procedure Code(s):        --- Professional ---                           [phone], Esophagogastroduodenoscopy, flexible,                            transoral; diagnostic, including collection of                            specimen(s) by brushing or washing, when performed                            (separate procedure) Diagnosis Code(s):        --- Professional ---                           K44.9, Diaphragmatic hernia without obstruction or  gangrene                           K20.9, Esophagitis, unspecified                           R10.13, Epigastric pain                           R93.3, Abnormal findings on diagnostic imaging of                            other parts of digestive tract CPT copyright 2016 [employer]. All rights reserved. The codes documented in this report are preliminary and upon coder review may  be revised to meet current compliance requirements. Otis Brace, MD Otis Brace, MD 12/24/2015 10:48:35 AM Number of Addenda: 0

## 2015-12-24 NOTE — Care Management Obs Status (Signed)
[place] NOTIFICATION   Patient Details  Name: William Avila MRN: [id number] Date of Birth: July 22, 1945   Medicare Observation Status Notification Given:  Yes   Explained to patient , patient did not sign copy left at bedside. Marilu Favre, RN 12/24/2015, 8:28 AM

## 2015-12-25 DIAGNOSIS — E46 Unspecified protein-calorie malnutrition: Secondary | ICD-10-CM | POA: Diagnosis not present

## 2015-12-25 DIAGNOSIS — R739 Hyperglycemia, unspecified: Secondary | ICD-10-CM | POA: Diagnosis not present

## 2015-12-25 DIAGNOSIS — E2749 Other adrenocortical insufficiency: Secondary | ICD-10-CM | POA: Diagnosis not present

## 2015-12-25 DIAGNOSIS — K209 Esophagitis, unspecified: Secondary | ICD-10-CM | POA: Diagnosis not present

## 2015-12-25 DIAGNOSIS — R030 Elevated blood-pressure reading, without diagnosis of hypertension: Secondary | ICD-10-CM | POA: Diagnosis not present

## 2015-12-25 DIAGNOSIS — K228 Other specified diseases of esophagus: Secondary | ICD-10-CM | POA: Diagnosis not present

## 2015-12-25 DIAGNOSIS — R1110 Vomiting, unspecified: Secondary | ICD-10-CM | POA: Diagnosis present

## 2015-12-25 DIAGNOSIS — K449 Diaphragmatic hernia without obstruction or gangrene: Secondary | ICD-10-CM | POA: Diagnosis not present

## 2015-12-25 DIAGNOSIS — Z885 Allergy status to narcotic agent status: Secondary | ICD-10-CM | POA: Diagnosis not present

## 2015-12-25 DIAGNOSIS — G8918 Other acute postprocedural pain: Secondary | ICD-10-CM | POA: Diagnosis not present

## 2015-12-25 DIAGNOSIS — Z79899 Other long term (current) drug therapy: Secondary | ICD-10-CM | POA: Diagnosis not present

## 2015-12-25 DIAGNOSIS — E860 Dehydration: Secondary | ICD-10-CM | POA: Diagnosis not present

## 2015-12-25 DIAGNOSIS — R1901 Right upper quadrant abdominal swelling, mass and lump: Secondary | ICD-10-CM | POA: Diagnosis not present

## 2015-12-25 DIAGNOSIS — Z888 Allergy status to other drugs, medicaments and biological substances status: Secondary | ICD-10-CM | POA: Diagnosis not present

## 2015-12-25 DIAGNOSIS — D72829 Elevated white blood cell count, unspecified: Secondary | ICD-10-CM | POA: Diagnosis not present

## 2015-12-25 DIAGNOSIS — Z87891 Personal history of nicotine dependence: Secondary | ICD-10-CM | POA: Diagnosis not present

## 2015-12-25 DIAGNOSIS — E278 Other specified disorders of adrenal gland: Secondary | ICD-10-CM | POA: Diagnosis not present

## 2015-12-25 DIAGNOSIS — Z886 Allergy status to analgesic agent status: Secondary | ICD-10-CM | POA: Diagnosis not present

## 2015-12-25 DIAGNOSIS — K440 Diaphragmatic hernia with obstruction, without gangrene: Secondary | ICD-10-CM | POA: Diagnosis not present

## 2015-12-25 DIAGNOSIS — R1013 Epigastric pain: Secondary | ICD-10-CM | POA: Diagnosis not present

## 2015-12-25 LAB — CBC
HCT: 39.8 % (ref 39.0–52.0)
Hemoglobin: 13.1 g/dL (ref 13.0–17.0)
MCH: 31.8 pg (ref 26.0–34.0)
MCHC: 32.9 g/dL (ref 30.0–36.0)
MCV: 96.6 fL (ref 78.0–100.0)
Platelets: 199 10*3/uL (ref 150–400)
RBC: 4.12 MIL/uL — ABNORMAL LOW (ref 4.22–5.81)
RDW: 12.7 % (ref 11.5–15.5)
WBC: 7.2 10*3/uL (ref 4.0–10.5)

## 2015-12-25 LAB — BASIC METABOLIC PANEL
Anion gap: 5 (ref 5–15)
BUN: 11 mg/dL (ref 6–20)
CO2: 28 mmol/L (ref 22–32)
Calcium: 8.5 mg/dL — ABNORMAL LOW (ref 8.9–10.3)
Chloride: 107 mmol/L (ref 101–111)
Creatinine, Ser: 0.94 mg/dL (ref 0.61–1.24)
GFR calc Af Amer: >60 mL/min (ref >60)
GFR calc non Af Amer: >60 mL/min (ref >60)
Glucose, Bld: 85 mg/dL (ref 65–99)
Potassium: 3.4 mmol/L — ABNORMAL LOW (ref 3.5–5.1)
Sodium: 140 mmol/L (ref 135–145)

## 2015-12-25 MED ORDER — POTASSIUM CHLORIDE 10 MEQ/100ML IV SOLN
10.0000 meq | INTRAVENOUS | Status: AC
  Administered 2015-12-25 (×2): 10 meq via INTRAVENOUS
  Filled 2015-12-25 (×2): qty 100

## 2015-12-25 MED ORDER — POTASSIUM CHLORIDE 10 MEQ/100ML IV SOLN
10.0000 meq | INTRAVENOUS | Status: AC
  Administered 2015-12-25: 10 meq via INTRAVENOUS
  Filled 2015-12-25 (×2): qty 100

## 2015-12-25 MED ORDER — CEFAZOLIN SODIUM-DEXTROSE 2-4 GM/100ML-% IV SOLN
2.0000 g | INTRAVENOUS | Status: AC
  Administered 2015-12-26: 2 g via INTRAVENOUS
  Filled 2015-12-25 (×2): qty 100

## 2015-12-25 NOTE — Progress Notes (Signed)
Patient ID: William Avila, male   DOB: [birth year], 71 y.o.   MRN: [id number]  [facility] Progress Note  1 Day Post-Op  Subjective: Feeling well this morning. Has been walking halls. No BM but is passing flatus. Had EGD yesterday which showed: - Large hiatal hernia. - Large paraesophageal hernia. - Large paraesophageal hernia. - LA Grade D esophagitis. - Excessive gastric fluid. - Normal examined duodenum. - No specimens collected. Recommendations to: - Return patient to hospital ward for ongoing care. - NPO. - Continue present medications. - Repeat upper endoscopy after 2-3 months to check healing.  Objective: Vital signs in last 24 hours: Temp:  [98.5 F (36.9 C)-99 F (37.2 C)] 98.5 F (36.9 C) (09/05 0541) Pulse Rate:  [49-60] 52 (09/05 0541) Resp:  [14-23] 19 (09/05 0541) BP: (128-188)/(77-113) 148/78 (09/05 0541) SpO2:  [95 %-98 %] 95 % (09/05 0541) Last BM Date: 12/22/15  Intake/Output from previous day: 09/04 0701 - 09/05 0700 In: 2800 [I.V.:2675; IV Piggyback:125] Out: 2000 [Urine:2000] Intake/Output this shift: No intake/output data recorded.  PE: Gen:  Alert, NAD, pleasant Card:  RRR Pulm:  CTAB Abd: Soft, ND, +BS, mildly tender in upper/mid abdomen. Laparoscopic and lower midline incisions well healed   Lab Results:   Recent Labs  12/24/15 0234 12/25/15 0540  WBC 12.5* 7.2  HGB 13.8 13.1  HCT 41.1 39.8  PLT 220 199   BMET  Recent Labs  12/24/15 0234 12/25/15 0540  NA 138 140  K 3.8 3.4*  CL 106 107  CO2 27 28  GLUCOSE 115* 85  BUN 12 11  CREATININE 0.88 0.94  CALCIUM 8.8* 8.5*   PT/INR No results for input(s): LABPROT, INR in the last 72 hours. CMP     Component Value Date/Time   NA 140 12/25/2015 0540   K 3.4 (L) 12/25/2015 0540   CL 107 12/25/2015 0540   CO2 28 12/25/2015 0540   GLUCOSE 85 12/25/2015 0540   BUN 11 12/25/2015 0540   CREATININE 0.94 12/25/2015 0540   CALCIUM 8.5 (L) 12/25/2015 0540   PROT 5.8 (L) 12/24/2015 0234   ALBUMIN 3.6 12/24/2015 0234   AST 19 12/24/2015 0234   ALT 14 (L) 12/24/2015 0234   ALKPHOS 79 12/24/2015 0234   BILITOT 0.6 12/24/2015 0234   GFRNONAA >60 12/25/2015 0540   GFRAA >60 12/25/2015 0540   Lipase     Component Value Date/Time   LIPASE 55 (H) 12/23/2015 0851       Studies/Results: Dg Chest Port 1 View  Result Date: 12/23/2015 CLINICAL DATA:  Mid chest pain. EXAM: PORTABLE CHEST 1 VIEW COMPARISON:  December 22, 2015 FINDINGS: The heart size and mediastinal contours are within normal limits. There is no focal infiltrate, pulmonary edema, or pleural effusion. Hiatal hernia is identified. Overlying soft tissue is projected over the upper chest bilaterally. The visualized skeletal structures are stable. IMPRESSION: No active cardiopulmonary disease. Electronically Signed   By: Abelardo Diesel M.D.   On: 12/23/2015 09:08    Anti-infectives: Anti-infectives    None       Assessment/Plan s/p Procedure(s): ESOPHAGOGASTRODUODENOSCOPY (EGD) WITH PROPOFOL 12/24/15 - Large hiatal hernia. - Large paraesophageal hernia. - LA Grade D esophagitis. - Excessive gastric fluid. - Normal examined duodenum. - No specimens collected. - Recommend repeat upper endoscopy after 2-3 months to check healing. Large hiatal and paraesophageal hernia.   -needs OR for hiatal hernia repair LA Grade D esophagitis - continue BID PPD Calcified right adrenal mass.  Chronic.  Followed by Dr. [name]. Leukocytosis - resolved, 7.2 today  VTE - lovenox FEN - NPO, IVF  Plan - will discuss with MD when patient will be taken to OR.   LOS: 0 days    Jerrye Beavers , [facility] 12/25/2015, 8:08 AM Pager: [phone] Consults: [phone] Mon-Fri 7:00 am-4:30 pm Sat-Sun 7:00 am-11:30 am

## 2015-12-25 NOTE — Progress Notes (Deleted)
Pt education completed to include future appointments, current prescriptions and medications, and doctors discharge instructions. Pt alert and oriented, vital signs stable. IV removed from pts L FA without complications. Pt eager to discharge and will be getting home via public bus. Pt discharged with his own bus pass in hand.

## 2015-12-25 NOTE — Progress Notes (Signed)
[facility]                                                                              Patient Demographics  William Avila, is a 70 y.o. male, DOB - Apr 17, 1946, BLT:[id number]  Admit date - 12/23/2015   Admitting Physician Courage Denton Brick, MD  Outpatient Primary MD for the patient is Irven Shelling, MD  Outpatient specialists:   LOS - 0  days    Chief Complaint  Patient presents with  . Abdominal Pain       Brief summary   William Avila is a 70 y.o. male with medical history significant for remote appendectomy, known large calcified right upper quadrant retroperitoneal mass present since 2015. Patient reported mild postprandial epigastric discomfort with bloating for about 6 months. He managed this by increasing meal frequency and decreasing meal size. Over the past week has had increasing epigastric discomfort with significant colicky abdominal pain immediate postprandial and as of the past 2 days has had pain not only associated with eating but pain not definitively related to eating food. Patient had CT abdomen and pelvis on 9/2 which showed very large hiatal hernia.Calcified mass or collection measuring 10 cm in the right upper quadrant of the retroperitoneum Patient was admitted for further workup  Assessment & Plan     Large Hiatal hernia with associated colicky Epigastric pain -Patient reported abdominal pain and emesis, inability to keep down even liquid foods or medications. Large hiatal hernia without evidence of volvulus formation documented on CT of the abdomen and pelvis performed 9/2 - Cont NPO status, IV fluids, IV pain medications as needed  - GI was Consulted, patient underwent EGD on 9/4 which showed large hiatal hernia, large paraesophageal hernia, grade D supper Titus with excessive gastric fluid and normal duodenum. Recommended surgical consultation for hernia repair. Surgery consulted.  - Continue PPI  Active Problems:  Dehydration -Continue IV fluid hydration    Leukocytosis -Likely reflective of dehydration. Lactic acid normal.    Protein calorie malnutrition, hypophosphatemia - Repleted phosphorous, now level close to normal - Potassium replaced IV    Retroperitoneal mass-large calcified mass, present since 2015 -Has been previously evaluated by Dr. Donne Hazel in the outpatient setting (see ER documentation from 9/2 regarding details)   Code Status: full  DVT Prophylaxis: Lovenox Family Communication: Discussed in detail with the patient, all imaging results, lab results explained to the patient and wife   Disposition Plan:   Time Spent in minutes   25 minutes  Procedures:  EGD   Consultants:   GI General surgery   Antimicrobials:      Medications  Scheduled Meds: . enoxaparin (LOVENOX) injection  40 mg Subcutaneous Q24H  . famotidine (PEPCID) IV  20 mg Intravenous Q12H  . feeding supplement  1 Container Oral TID BM  . folic acid  1 mg Intravenous Daily  . [START ON 12/27/2015] pantoprazole  40 mg Intravenous Q12H  . potassium chloride  10 mEq Intravenous Q1 Hr x 3  . sucralfate  1 g Oral TID WC & HS  . thiamine  100 mg Intravenous Daily   Continuous Infusions: . sodium  chloride 75 mL/hr at 12/25/15 0811   PRN Meds:.acetaminophen **OR** acetaminophen, chlorproMAZINE (THORAZINE) IV, fentaNYL (SUBLIMAZE) injection, ondansetron (ZOFRAN) IV   Antibiotics   Anti-infectives    None        Subjective:   William Avila was seen and examined today.Hiccups improving, no significant epigastric pain this morning. No nausea or vomiting.  No chest pain, fevers or chills.  No acute events overnight.    Objective:   Vitals:   12/24/15 1057 12/24/15 1325 12/24/15 2240 12/25/15 0541  BP:  (!) 161/95 (!) 146/93 (!) 148/78  Pulse: (!) 54 (!) 54 (!) 49 (!) 52  Resp: '15 16 17 19  '$ Temp:  98.7 F (37.1 C) 99 F (37.2 C) 98.5 F (36.9 C)  TempSrc:  Oral Oral   SpO2: 95% 95%  95% 95%  Weight:      Height:        Intake/Output Summary (Last 24 hours) at 12/25/15 1135 Last data filed at 12/25/15 0653  Gross per 24 hour  Intake             2450 ml  Output             1600 ml  Net              850 ml     Wt Readings from Last 3 Encounters:  12/23/15 77.1 kg (170 lb)  12/22/15 77.1 kg (170 lb)  02/11/15 81.6 kg (180 lb)     Exam  General: Alert and oriented x 3, NAD  HEENT:    Neck: Supple, no JVD, no masses  Cardiovascular: S1 S2 clear.  Respiratory: CTAB  Gastrointestinal: Soft, NT nondistended, + bowel sounds  Ext: no cyanosis clubbing or edema  Neuro: no new focal deficits  Skin: No rashes  Psych: Normal affect and demeanor, alert and oriented x3    Data Reviewed:  I have personally reviewed following labs and imaging studies  Micro Results No results found for this or any previous visit (from the past 240 hour(s)).  Radiology Reports Dg Chest 2 View  Result Date: 12/22/2015 CLINICAL DATA:  Chest and upper abdomen pain starting yesterday EXAM: CHEST  2 VIEW COMPARISON:  May 08, 2007 FINDINGS: The heart size and mediastinal contours are within normal limits. There is no focal infiltrate, pulmonary edema, or pleural effusion. The visualized skeletal structures are stable. There is a large hiatal hernia. IMPRESSION: No active cardiopulmonary disease. Electronically Signed   By: Abelardo Diesel M.D.   On: 12/22/2015 11:05   Ct Abdomen Pelvis W Contrast  Result Date: 12/22/2015 CLINICAL DATA:  Epigastric pain when trying to eat or drink. Symptoms began yesterday when he ate lunch. EXAM: CT ABDOMEN AND PELVIS WITH CONTRAST TECHNIQUE: Multidetector CT imaging of the abdomen and pelvis was performed using the standard protocol following bolus administration of intravenous contrast. CONTRAST:  100 cc Isovue-300 COMPARISON:  Lumbar spine films 08/30/2010, chest x-ray 12/22/2015 FINDINGS: Lower chest: There is small amount of scarring at both  lung bases. Heart size is normal. Minimal atherosclerosis at the level of the aortic valve. There is a large hiatal hernia. Hepatobiliary: Posterior to liver, superior to the right kidney, and lateral to the adrenal gland, there is a hyperdense calcified mass or collection contrast which measures 8.8 x 10.0 x 6.9 cm. Correlation with history is recommended. Has the patient had interventional procedures performed in the right upper quadrant? Pancreas: Normal in appearance. Spleen: Normal in appearance. Renal/Adrenal: The right adrenal gland  appears to be normal in appearance but is in close proximity to the collection or calcified mass described above. The left adrenal gland is normal in appearance. The kidneys are normal in appearance. There is normal excretion and enhancement bilaterally. Gastrointestinal tract: Large hiatal hernia is present. Small bowel loops are normal in appearance. Numerous colonic diverticular are present. Status post appendectomy. Reproductive/Pelvis: Urinary bladder is normal in appearance. The prostate is enlarged. Seminal vesicles are symmetric. No free pelvic fluid. Vascular/Lymphatic: There is atherosclerotic calcification of the abdominal aorta. The lower thoracic aorta is tortuous. No retroperitoneal or mesenteric adenopathy. Musculoskeletal/Abdominal wall: Abdominal wall is unremarkable. Status post anterior fusion of the lumbar spine at L4-5 and L5-S1. Superior endplate fracture at L2 and wedge compression fracture of L1 or chronic and stable in appearance. Vacuum disc phenomenon at L3-4. Other: none IMPRESSION: 1. Calcified mass or collection of contrast measuring 10 cm in the right upper quadrant retroperitoneum. Recommend correlation with history guarding any prior interventional procedures, contrast injection. The mass has a benign appearance and is unlikely causing the patient's symptoms. 2. Large hiatal hernia. 3.  Aortic atherosclerosis. 4. Enlarged prostate. 5. Prior spinal  surgery and spondylosis. Electronically Signed   By: Nolon Nations M.D.   On: 12/22/2015 15:06   Dg Chest Port 1 View  Result Date: 12/23/2015 CLINICAL DATA:  Mid chest pain. EXAM: PORTABLE CHEST 1 VIEW COMPARISON:  December 22, 2015 FINDINGS: The heart size and mediastinal contours are within normal limits. There is no focal infiltrate, pulmonary edema, or pleural effusion. Hiatal hernia is identified. Overlying soft tissue is projected over the upper chest bilaterally. The visualized skeletal structures are stable. IMPRESSION: No active cardiopulmonary disease. Electronically Signed   By: Abelardo Diesel M.D.   On: 12/23/2015 09:08    Lab Data:  CBC:  Recent Labs Lab 12/22/15 1015 12/23/15 0851 12/24/15 0234 12/25/15 0540  WBC 11.2* 13.0* 12.5* 7.2  NEUTROABS 9.8*  --   --   --   HGB 15.7 15.5 13.8 13.1  HCT 44.8 45.6 41.1 39.8  MCV 93.5 95.4 96.3 96.6  PLT 252 238 220 163   Basic Metabolic Panel:  Recent Labs Lab 12/22/15 1015 12/23/15 0851 12/23/15 1342 12/24/15 0234 12/25/15 0540  NA 139 138  --  138 140  K 3.5 3.6  --  3.8 3.4*  CL 100* 103  --  106 107  CO2 27 24  --  27 28  GLUCOSE 127* 144*  --  115* 85  BUN 13 14  --  12 11  CREATININE 0.91 1.11  --  0.88 0.94  CALCIUM 10.0 9.5  --  8.8* 8.5*  MG  --   --  1.8  --   --   PHOS  --   --  1.6* 3.2  --    GFR: Estimated Creatinine Clearance: 79.7 mL/min (by C-G formula based on SCr of 0.94 mg/dL). Liver Function Tests:  Recent Labs Lab 12/22/15 1015 12/23/15 0851 12/24/15 0234  AST '21 26 19  '$ ALT 17 16* 14*  ALKPHOS 95 96 79  BILITOT 0.8 1.1 0.6  PROT 6.8 6.6 5.8*  ALBUMIN 4.2 4.1 3.6    Recent Labs Lab 12/22/15 1015 12/23/15 0851  LIPASE 76* 55*   No results for input(s): AMMONIA in the last 168 hours. Coagulation Profile: No results for input(s): INR, PROTIME in the last 168 hours. Cardiac Enzymes: No results for input(s): CKTOTAL, CKMB, CKMBINDEX, TROPONINI in the last 168 hours. BNP  (last  3 results) No results for input(s): PROBNP in the last 8760 hours. HbA1C: No results for input(s): HGBA1C in the last 72 hours. CBG: No results for input(s): GLUCAP in the last 168 hours. Lipid Profile: No results for input(s): CHOL, HDL, LDLCALC, TRIG, CHOLHDL, LDLDIRECT in the last 72 hours. Thyroid Function Tests:  Recent Labs  12/24/15 0234  TSH 2.014   Anemia Panel: No results for input(s): VITAMINB12, FOLATE, FERRITIN, TIBC, IRON, RETICCTPCT in the last 72 hours. Urine analysis:    Component Value Date/Time   COLORURINE YELLOW 09/14/2010 [place] 09/14/2010 1217   LABSPEC 1.019 09/14/2010 1217   PHURINE 7.5 09/14/2010 1217   GLUCOSEU NEGATIVE 09/14/2010 1217   HGBUR NEGATIVE 09/14/2010 [place] 09/14/2010 1217   KETONESUR NEGATIVE 09/14/2010 1217   PROTEINUR NEGATIVE 09/14/2010 1217   UROBILINOGEN 0.2 09/14/2010 1217   NITRITE NEGATIVE 09/14/2010 1217   LEUKOCYTESUR  09/14/2010 1217    NEGATIVE MICROSCOPIC NOT DONE ON URINES WITH NEGATIVE PROTEIN, BLOOD, LEUKOCYTES, NITRITE, OR GLUCOSE <1000 mg/dL.     RAI,RIPUDEEP M.D. [facility] 12/25/2015, 11:35 AM  Pager: [phone] Between 7am to 7pm - call Pager - [phone]  After 7pm go to [email or web address] - password [email or web address]  Call night coverage person covering after 7pm

## 2015-12-26 ENCOUNTER — Inpatient Hospital Stay (HOSPITAL_COMMUNITY): Payer: Medicare HMO | Admitting: Certified Registered Nurse Anesthetist

## 2015-12-26 ENCOUNTER — Encounter (HOSPITAL_COMMUNITY)
Admission: EM | Disposition: A | Discharge status: Home / Self Care | Payer: Self-pay | Source: Home / Self Care | Attending: Internal Medicine

## 2015-12-26 DIAGNOSIS — E46 Unspecified protein-calorie malnutrition: Secondary | ICD-10-CM

## 2015-12-26 DIAGNOSIS — E860 Dehydration: Secondary | ICD-10-CM

## 2015-12-26 DIAGNOSIS — K449 Diaphragmatic hernia without obstruction or gangrene: Secondary | ICD-10-CM

## 2015-12-26 HISTORY — PX: HIATAL HERNIA REPAIR: SHX195

## 2015-12-26 LAB — CBC
HCT: 39.4 % (ref 39.0–52.0)
Hemoglobin: 13.3 g/dL (ref 13.0–17.0)
MCH: 32 pg (ref 26.0–34.0)
MCHC: 33.8 g/dL (ref 30.0–36.0)
MCV: 94.7 fL (ref 78.0–100.0)
Platelets: 214 10*3/uL (ref 150–400)
RBC: 4.16 MIL/uL — ABNORMAL LOW (ref 4.22–5.81)
RDW: 12.4 % (ref 11.5–15.5)
WBC: 6.9 10*3/uL (ref 4.0–10.5)

## 2015-12-26 LAB — BASIC METABOLIC PANEL
Anion gap: 13 (ref 5–15)
BUN: 11 mg/dL (ref 6–20)
CO2: 24 mmol/L (ref 22–32)
Calcium: 8.2 mg/dL — ABNORMAL LOW (ref 8.9–10.3)
Chloride: 99 mmol/L — ABNORMAL LOW (ref 101–111)
Creatinine, Ser: 0.96 mg/dL (ref 0.61–1.24)
GFR calc Af Amer: >60 mL/min (ref >60)
GFR calc non Af Amer: >60 mL/min (ref >60)
Glucose, Bld: 74 mg/dL (ref 65–99)
Potassium: 3.4 mmol/L — ABNORMAL LOW (ref 3.5–5.1)
Sodium: 136 mmol/L (ref 135–145)

## 2015-12-26 LAB — SURGICAL PCR SCREEN
MRSA, PCR: NEGATIVE
Staphylococcus aureus: NEGATIVE

## 2015-12-26 SURGERY — REPAIR, HERNIA, HIATAL, LAPAROSCOPIC
Anesthesia: General | Site: Abdomen

## 2015-12-26 MED ORDER — SUCCINYLCHOLINE 20MG/ML (10ML) SYRINGE FOR MEDFUSION PUMP - OPTIME
INTRAMUSCULAR | Status: DC | PRN
  Administered 2015-12-26: 80 mg via INTRAVENOUS

## 2015-12-26 MED ORDER — ROCURONIUM BROMIDE 10 MG/ML (PF) SYRINGE
PREFILLED_SYRINGE | INTRAVENOUS | Status: AC
  Filled 2015-12-26: qty 10

## 2015-12-26 MED ORDER — ACETAMINOPHEN 650 MG RE SUPP: 650.0000 mg | Freq: Four times a day (QID) | RECTAL | Status: DC | PRN

## 2015-12-26 MED ORDER — ROCURONIUM BROMIDE 100 MG/10ML IV SOLN
INTRAVENOUS | Status: DC | PRN
  Administered 2015-12-26 (×4): 10 mg via INTRAVENOUS
  Administered 2015-12-26: 50 mg via INTRAVENOUS
  Administered 2015-12-26: 20 mg via INTRAVENOUS

## 2015-12-26 MED ORDER — DEXAMETHASONE SODIUM PHOSPHATE 10 MG/ML IJ SOLN
INTRAMUSCULAR | Status: AC
  Filled 2015-12-26: qty 1

## 2015-12-26 MED ORDER — METOPROLOL TARTRATE 12.5 MG HALF TABLET: 12.5000 mg | ORAL_TABLET | Freq: Two times a day (BID) | ORAL | Status: DC | PRN

## 2015-12-26 MED ORDER — PROCHLORPERAZINE EDISYLATE 5 MG/ML IJ SOLN
5.0000 mg | INTRAMUSCULAR | Status: DC | PRN
  Filled 2015-12-26: qty 2

## 2015-12-26 MED ORDER — ONDANSETRON HCL 4 MG/2ML IJ SOLN
INTRAMUSCULAR | Status: DC | PRN
Start: 2015-12-26 — End: 2015-12-26
  Administered 2015-12-26: 4 mg via INTRAVENOUS

## 2015-12-26 MED ORDER — HYDROMORPHONE HCL 1 MG/ML IJ SOLN
0.2500 mg | INTRAMUSCULAR | Status: DC | PRN
Start: 2015-12-26 — End: 2015-12-26
  Administered 2015-12-26 (×4): 0.5 mg via INTRAVENOUS

## 2015-12-26 MED ORDER — GLYCOPYRROLATE 0.2 MG/ML IV SOSY
PREFILLED_SYRINGE | INTRAVENOUS | Status: AC
  Filled 2015-12-26: qty 3

## 2015-12-26 MED ORDER — SODIUM CHLORIDE 0.9 % IV SOLN: 250.0000 mL | INTRAVENOUS | Status: DC | PRN

## 2015-12-26 MED ORDER — PROPOFOL 10 MG/ML IV BOLUS
INTRAVENOUS | Status: AC
  Filled 2015-12-26: qty 20

## 2015-12-26 MED ORDER — FENTANYL CITRATE (PF) 100 MCG/2ML IJ SOLN
INTRAMUSCULAR | Status: AC
  Filled 2015-12-26: qty 2

## 2015-12-26 MED ORDER — POLYETHYLENE GLYCOL 3350 17 G PO PACK: 17.0000 g | PACK | Freq: Two times a day (BID) | ORAL | Status: DC | PRN

## 2015-12-26 MED ORDER — OXYCODONE HCL 5 MG PO TABS: 5.0000 mg | ORAL_TABLET | ORAL | Status: DC | PRN

## 2015-12-26 MED ORDER — ONDANSETRON HCL 4 MG/2ML IJ SOLN
4.0000 mg | Freq: Four times a day (QID) | INTRAMUSCULAR | Status: DC | PRN
  Administered 2015-12-27 – 2015-12-28 (×3): 4 mg via INTRAVENOUS
  Filled 2015-12-26 (×3): qty 2

## 2015-12-26 MED ORDER — FENTANYL CITRATE (PF) 100 MCG/2ML IJ SOLN
INTRAMUSCULAR | Status: AC
  Filled 2015-12-26: qty 4

## 2015-12-26 MED ORDER — PHENOL 1.4 % MT LIQD: 2.0000 | OROMUCOSAL | Status: DC | PRN

## 2015-12-26 MED ORDER — METRONIDAZOLE IN NACL 5-0.79 MG/ML-% IV SOLN
500.0000 mg | INTRAVENOUS | Status: AC
  Administered 2015-12-26: 500 mg via INTRAVENOUS
  Filled 2015-12-26: qty 100

## 2015-12-26 MED ORDER — ACETAMINOPHEN 325 MG PO TABS: 325.0000 mg | ORAL_TABLET | Freq: Four times a day (QID) | ORAL | Status: DC | PRN

## 2015-12-26 MED ORDER — DIPHENHYDRAMINE HCL 50 MG/ML IJ SOLN: 12.5000 mg | Freq: Four times a day (QID) | INTRAMUSCULAR | Status: DC | PRN

## 2015-12-26 MED ORDER — SODIUM CHLORIDE 0.9% FLUSH: 3.0000 mL | Freq: Two times a day (BID) | INTRAVENOUS | Status: DC

## 2015-12-26 MED ORDER — MIDAZOLAM HCL 5 MG/5ML IJ SOLN
INTRAMUSCULAR | Status: DC | PRN
  Administered 2015-12-26: 2 mg via INTRAVENOUS

## 2015-12-26 MED ORDER — BISACODYL 10 MG RE SUPP: 10.0000 mg | Freq: Two times a day (BID) | RECTAL | Status: DC | PRN

## 2015-12-26 MED ORDER — SODIUM CHLORIDE 0.9 % IV SOLN
8.0000 mg | Freq: Four times a day (QID) | INTRAVENOUS | Status: DC | PRN
  Filled 2015-12-26: qty 4

## 2015-12-26 MED ORDER — PHENYLEPHRINE 40 MCG/ML (10ML) SYRINGE FOR IV PUSH (FOR BLOOD PRESSURE SUPPORT)
PREFILLED_SYRINGE | INTRAVENOUS | Status: AC
  Filled 2015-12-26: qty 10

## 2015-12-26 MED ORDER — DEXAMETHASONE SODIUM PHOSPHATE 10 MG/ML IJ SOLN
INTRAMUSCULAR | Status: DC | PRN
  Administered 2015-12-26: 5 mg via INTRAVENOUS

## 2015-12-26 MED ORDER — PROMETHAZINE HCL 25 MG/ML IJ SOLN: 6.2500 mg | INTRAMUSCULAR | Status: DC | PRN

## 2015-12-26 MED ORDER — GUAIFENESIN-DM 100-10 MG/5ML PO SYRP: 15.0000 mL | ORAL_SOLUTION | ORAL | Status: DC | PRN

## 2015-12-26 MED ORDER — METOCLOPRAMIDE HCL 5 MG/ML IJ SOLN: 5.0000 mg | Freq: Four times a day (QID) | INTRAMUSCULAR | Status: DC | PRN

## 2015-12-26 MED ORDER — MIDAZOLAM HCL 2 MG/2ML IJ SOLN
INTRAMUSCULAR | Status: AC
  Filled 2015-12-26: qty 2

## 2015-12-26 MED ORDER — METRONIDAZOLE IN NACL 5-0.79 MG/ML-% IV SOLN
500.0000 mg | Freq: Four times a day (QID) | INTRAVENOUS | Status: AC
  Administered 2015-12-26 – 2015-12-27 (×3): 500 mg via INTRAVENOUS
  Filled 2015-12-26 (×3): qty 100

## 2015-12-26 MED ORDER — BLISTEX MEDICATED EX OINT
TOPICAL_OINTMENT | Freq: Two times a day (BID) | CUTANEOUS | Status: DC
  Administered 2015-12-26 – 2015-12-27 (×2): via TOPICAL
  Administered 2015-12-27 – 2015-12-28 (×2): 1 via TOPICAL
  Filled 2015-12-26: qty 6.3

## 2015-12-26 MED ORDER — HYDROMORPHONE HCL 1 MG/ML IJ SOLN
INTRAMUSCULAR | Status: AC
  Administered 2015-12-26: 0.5 mg via INTRAVENOUS
  Filled 2015-12-26: qty 1

## 2015-12-26 MED ORDER — HYDROMORPHONE HCL 1 MG/ML IJ SOLN: 0.2500 mg | INTRAMUSCULAR | Status: DC | PRN

## 2015-12-26 MED ORDER — PROPOFOL 10 MG/ML IV BOLUS
INTRAVENOUS | Status: DC | PRN
  Administered 2015-12-26: 150 mg via INTRAVENOUS
  Administered 2015-12-26: 50 mg via INTRAVENOUS

## 2015-12-26 MED ORDER — FENTANYL CITRATE (PF) 100 MCG/2ML IJ SOLN
INTRAMUSCULAR | Status: DC | PRN
  Administered 2015-12-26 (×3): 50 ug via INTRAVENOUS
  Administered 2015-12-26: 100 ug via INTRAVENOUS
  Administered 2015-12-26: 50 ug via INTRAVENOUS

## 2015-12-26 MED ORDER — MAGIC MOUTHWASH: 15.0000 mL | Freq: Four times a day (QID) | ORAL | Status: DC | PRN

## 2015-12-26 MED ORDER — LIP MEDEX EX OINT
1.0000 "application " | TOPICAL_OINTMENT | Freq: Two times a day (BID) | CUTANEOUS | Status: DC
  Filled 2015-12-26: qty 7

## 2015-12-26 MED ORDER — LIDOCAINE 2% (20 MG/ML) 5 ML SYRINGE
INTRAMUSCULAR | Status: AC
  Filled 2015-12-26: qty 5

## 2015-12-26 MED ORDER — LACTATED RINGERS IV SOLN
INTRAVENOUS | Status: DC
  Administered 2015-12-26 (×3): via INTRAVENOUS

## 2015-12-26 MED ORDER — GLYCOPYRROLATE 0.2 MG/ML IJ SOLN
INTRAMUSCULAR | Status: DC | PRN
  Administered 2015-12-26 (×4): 0.1 mg via INTRAVENOUS

## 2015-12-26 MED ORDER — CEFAZOLIN SODIUM-DEXTROSE 2-4 GM/100ML-% IV SOLN
2.0000 g | Freq: Three times a day (TID) | INTRAVENOUS | Status: AC
  Administered 2015-12-26 – 2015-12-27 (×2): 2 g via INTRAVENOUS
  Filled 2015-12-26 (×2): qty 100

## 2015-12-26 MED ORDER — ALBUMIN HUMAN 5 % IV SOLN
INTRAVENOUS | Status: DC | PRN
  Administered 2015-12-26: 12:00:00 via INTRAVENOUS

## 2015-12-26 MED ORDER — MENTHOL 3 MG MT LOZG: 1.0000 | LOZENGE | OROMUCOSAL | Status: DC | PRN

## 2015-12-26 MED ORDER — DIPHENHYDRAMINE HCL 50 MG/ML IJ SOLN
INTRAMUSCULAR | Status: AC
  Filled 2015-12-26: qty 1

## 2015-12-26 MED ORDER — ONDANSETRON HCL 4 MG/2ML IJ SOLN: 4.0000 mg | Freq: Once | INTRAMUSCULAR | Status: DC | PRN

## 2015-12-26 MED ORDER — METHOCARBAMOL 1000 MG/10ML IJ SOLN: 1000.0000 mg | Freq: Four times a day (QID) | INTRAVENOUS | Status: DC | PRN

## 2015-12-26 MED ORDER — BUPIVACAINE-EPINEPHRINE (PF) 0.25% -1:200000 IJ SOLN
INTRAMUSCULAR | Status: AC
  Filled 2015-12-26: qty 60

## 2015-12-26 MED ORDER — ALUM & MAG HYDROXIDE-SIMETH 200-200-20 MG/5ML PO SUSP
30.0000 mL | Freq: Four times a day (QID) | ORAL | Status: DC | PRN
  Administered 2015-12-26: 30 mL via ORAL
  Filled 2015-12-26: qty 30

## 2015-12-26 MED ORDER — ONDANSETRON HCL 4 MG/2ML IJ SOLN
INTRAMUSCULAR | Status: AC
  Filled 2015-12-26: qty 4

## 2015-12-26 MED ORDER — LACTATED RINGERS IV BOLUS (SEPSIS): 1000.0000 mL | Freq: Three times a day (TID) | INTRAVENOUS | Status: DC | PRN

## 2015-12-26 MED ORDER — 0.9 % SODIUM CHLORIDE (POUR BTL) OPTIME
TOPICAL | Status: DC | PRN
  Administered 2015-12-26 (×2): 1000 mL

## 2015-12-26 MED ORDER — BUPIVACAINE-EPINEPHRINE 0.25% -1:200000 IJ SOLN
INTRAMUSCULAR | Status: DC | PRN
  Administered 2015-12-26: 24 mL

## 2015-12-26 MED ORDER — HYDROMORPHONE HCL 1 MG/ML IJ SOLN
0.5000 mg | INTRAMUSCULAR | Status: DC | PRN
  Administered 2015-12-26 – 2015-12-28 (×8): 1 mg via INTRAVENOUS
  Administered 2015-12-28: 2 mg via INTRAVENOUS
  Administered 2015-12-29: 1 mg via INTRAVENOUS
  Filled 2015-12-26: qty 1
  Filled 2015-12-26: qty 2
  Filled 2015-12-26 (×5): qty 1
  Filled 2015-12-26: qty 2
  Filled 2015-12-26 (×2): qty 1

## 2015-12-26 MED ORDER — ACETAMINOPHEN 10 MG/ML IV SOLN
1000.0000 mg | Freq: Once | INTRAVENOUS | Status: AC
  Administered 2015-12-26: 1000 mg via INTRAVENOUS

## 2015-12-26 MED ORDER — SUCCINYLCHOLINE CHLORIDE 200 MG/10ML IV SOSY
PREFILLED_SYRINGE | INTRAVENOUS | Status: AC
  Filled 2015-12-26: qty 10

## 2015-12-26 MED ORDER — SUGAMMADEX SODIUM 200 MG/2ML IV SOLN
INTRAVENOUS | Status: DC | PRN
  Administered 2015-12-26: 200 mg via INTRAVENOUS

## 2015-12-26 MED ORDER — DIPHENHYDRAMINE HCL 50 MG/ML IJ SOLN
INTRAMUSCULAR | Status: DC | PRN
  Administered 2015-12-26: 25 mg via INTRAVENOUS

## 2015-12-26 MED ORDER — ACETAMINOPHEN 10 MG/ML IV SOLN
INTRAVENOUS | Status: AC
  Filled 2015-12-26: qty 100

## 2015-12-26 MED ORDER — SODIUM CHLORIDE 0.9% FLUSH: 3.0000 mL | INTRAVENOUS | Status: DC | PRN

## 2015-12-26 MED ORDER — LIDOCAINE HCL (CARDIAC) 20 MG/ML IV SOLN
INTRAVENOUS | Status: DC | PRN
  Administered 2015-12-26: 100 mg via INTRAVENOUS

## 2015-12-26 MED ORDER — EPHEDRINE SULFATE 50 MG/ML IJ SOLN
INTRAMUSCULAR | Status: DC | PRN
  Administered 2015-12-26 (×3): 5 mg via INTRAVENOUS
  Administered 2015-12-26: 10 mg via INTRAVENOUS
  Administered 2015-12-26 (×2): 5 mg via INTRAVENOUS

## 2015-12-26 SURGICAL SUPPLY — 73 items
APPLIER CLIP 5 13 M/L LIGAMAX5 (MISCELLANEOUS)
APPLIER CLIP ROT 10 11.4 M/L (STAPLE)
APR CLP MED LRG 11.4X10 (STAPLE)
APR CLP MED LRG 5 ANG JAW (MISCELLANEOUS)
CABLE HIGH FREQUENCY MONO STRZ (ELECTRODE) ×2 IMPLANT
CLIP APPLIE 5 13 M/L LIGAMAX5 (MISCELLANEOUS) IMPLANT
CLIP APPLIE ROT 10 11.4 M/L (STAPLE) IMPLANT
COVER SURGICAL LIGHT HANDLE (MISCELLANEOUS) ×2 IMPLANT
DECANTER SPIKE VIAL GLASS SM (MISCELLANEOUS) ×2 IMPLANT
DRAIN CHANNEL 19F RND (DRAIN) ×1 IMPLANT
DRAPE LAPAROSCOPIC ABDOMINAL (DRAPES) ×2 IMPLANT
DRAPE UTILITY XL STRL (DRAPES) ×2 IMPLANT
DRAPE WARM FLUID 44X44 (DRAPE) ×2 IMPLANT
DRSG TEGADERM 2-3/8X2-3/4 SM (GAUZE/BANDAGES/DRESSINGS) ×4 IMPLANT
DRSG TEGADERM 4X4.75 (GAUZE/BANDAGES/DRESSINGS) ×1 IMPLANT
ELECT REM PT RETURN 9FT ADLT (ELECTROSURGICAL) ×2
ELECTRODE REM PT RTRN 9FT ADLT (ELECTROSURGICAL) ×1 IMPLANT
EVACUATOR SILICONE 100CC (DRAIN) ×1 IMPLANT
FELT TEFLON 6X6 (MISCELLANEOUS) ×1 IMPLANT
FILTER SMOKE EVAC LAPAROSHD (FILTER) ×2 IMPLANT
GAUZE SPONGE 2X2 8PLY STRL LF (GAUZE/BANDAGES/DRESSINGS) IMPLANT
GLOVE BIO SURGEON STRL SZ 6 (GLOVE) ×3 IMPLANT
GLOVE BIO SURGEON STRL SZ7 (GLOVE) ×2 IMPLANT
GLOVE BIOGEL PI IND STRL 6.5 (GLOVE) IMPLANT
GLOVE BIOGEL PI IND STRL 7.0 (GLOVE) IMPLANT
GLOVE BIOGEL PI IND STRL 7.5 (GLOVE) IMPLANT
GLOVE BIOGEL PI IND STRL 8 (GLOVE) ×1 IMPLANT
GLOVE BIOGEL PI INDICATOR 6.5 (GLOVE) ×4
GLOVE BIOGEL PI INDICATOR 7.0 (GLOVE) ×2
GLOVE BIOGEL PI INDICATOR 7.5 (GLOVE) ×1
GLOVE BIOGEL PI INDICATOR 8 (GLOVE) ×1
GLOVE ECLIPSE 6.0 STRL STRAW (GLOVE) ×1 IMPLANT
GLOVE ECLIPSE 8.0 STRL XLNG CF (GLOVE) ×2 IMPLANT
GLOVE SURG SS PI 6.5 STRL IVOR (GLOVE) ×1 IMPLANT
GLOVE SURG SS PI 8.0 STRL IVOR (GLOVE) ×1 IMPLANT
GOWN STRL REUS W/ TWL LRG LVL3 (GOWN DISPOSABLE) IMPLANT
GOWN STRL REUS W/TWL LRG LVL3 (GOWN DISPOSABLE) ×16
GOWN STRL REUS W/TWL XL LVL3 (GOWN DISPOSABLE) ×6 IMPLANT
KIT BASIN OR (CUSTOM PROCEDURE TRAY) ×2 IMPLANT
LEGGING LITHOTOMY PAIR STRL (DRAPES) ×2 IMPLANT
MARKER SKIN DUAL TIP RULER LAB (MISCELLANEOUS) ×2 IMPLANT
MESH PHASIX RESORB RECT 10X15 (Mesh General) ×1 IMPLANT
SCISSORS LAP 5X35 DISP (ENDOMECHANICALS) ×2 IMPLANT
SET IRRIG TUBING LAPAROSCOPIC (IRRIGATION / IRRIGATOR) ×2 IMPLANT
SHEARS HARMONIC ACE PLUS 36CM (ENDOMECHANICALS) ×2 IMPLANT
SLEEVE ENDOPATH XCEL 5M (ENDOMECHANICALS) ×3 IMPLANT
SPONGE DRAIN TRACH 4X4 STRL 2S (GAUZE/BANDAGES/DRESSINGS) IMPLANT
SPONGE GAUZE 2X2 STER 10/PKG (GAUZE/BANDAGES/DRESSINGS) ×1
STAPLER VISISTAT 35W (STAPLE) ×2 IMPLANT
SUT 0 0 DBL MH NDL ETHIBOND (SUTURE) ×1 IMPLANT
SUT 0 0 DBL MH NEEDLE ETHIBOND (SUTURE) ×10 IMPLANT
SUT ETHIBOND 0 (SUTURE) IMPLANT
SUT ETHIBOND 0 36 GRN (SUTURE) IMPLANT
SUT ETHIBOND 0 MO6 C/R (SUTURE) ×2 IMPLANT
SUT ETHIBOND CT1 BRD #0 30IN (SUTURE) IMPLANT
SUT ETHIBOND NAB CT1 #1 30IN (SUTURE) ×3 IMPLANT
SUT ETHILON 2 0 PS N (SUTURE) IMPLANT
SUT MNCRL AB 4-0 PS2 18 (SUTURE) ×3 IMPLANT
SUT PROLENE 2 0 SH DA (SUTURE) ×1 IMPLANT
TIP INNERVISION DETACH 40FR (MISCELLANEOUS) IMPLANT
TIP INNERVISION DETACH 50FR (MISCELLANEOUS) IMPLANT
TIP INNERVISION DETACH 56FR (MISCELLANEOUS) ×1 IMPLANT
TIPS INNERVISION DETACH 40FR (MISCELLANEOUS)
TOWEL OR 17X26 10 PK STRL BLUE (TOWEL DISPOSABLE) ×2 IMPLANT
TOWEL OR NON WOVEN STRL DISP B (DISPOSABLE) ×2 IMPLANT
TRAY FOLEY W/METER SILVER 14FR (SET/KITS/TRAYS/PACK) ×2 IMPLANT
TRAY FOLEY W/METER SILVER 16FR (SET/KITS/TRAYS/PACK) ×2 IMPLANT
TRAY LAPAROSCOPIC (CUSTOM PROCEDURE TRAY) ×2 IMPLANT
TROCAR BLADELESS OPT 5 100 (ENDOMECHANICALS) ×2 IMPLANT
TROCAR XCEL BLADELESS 5X75MML (TROCAR) ×2 IMPLANT
TROCAR XCEL NON-BLD 11X100MML (ENDOMECHANICALS) ×3 IMPLANT
TUBE CONNECTING 20X1/4 (TUBING) ×1 IMPLANT
TUBING INSUF HEATED (TUBING) ×2 IMPLANT

## 2015-12-26 NOTE — Progress Notes (Signed)
Dr. Ola Spurr notified of patient's blood pressure.  Will continue to monitor.

## 2015-12-26 NOTE — Progress Notes (Signed)
[facility]                                                                              Patient Demographics  William Avila, is a 70 y.o. male, DOB - January 10, 1946, PYK:[id number]  Admit date - 12/23/2015   Admitting Physician Courage Denton Brick, MD  Outpatient Primary MD for the patient is Irven Shelling, MD  Outpatient specialists:   LOS - 1  days    Chief Complaint  Patient presents with  . Abdominal Pain       Brief summary   William Avila is a 70 y.o. male with medical history significant for remote appendectomy, known large calcified right upper quadrant retroperitoneal mass present since 2015. Patient reported mild postprandial epigastric discomfort with bloating for about 6 months. He managed this by increasing meal frequency and decreasing meal size. Over the past week has had increasing epigastric discomfort with significant colicky abdominal pain immediate postprandial and as of the past 2 days has had pain not only associated with eating but pain not definitively related to eating food. Patient had CT abdomen and pelvis on 9/2 which showed very large hiatal hernia. Calcified mass or collection measuring 10 cm in the right upper quadrant of the retroperitoneum Patient was admitted for further workup  Assessment & Plan     Large Hiatal hernia with associated colicky Epigastric pain -Patient reported abdominal pain and emesis, inability to keep down even liquid foods or medications. Large hiatal hernia without evidence of volvulus formation documented on CT of the abdomen and pelvis performed 9/2 - Cont NPO status, IV fluids, IV pain medications as needed  - GI was consulted. Patient underwent EGD on 9/4 which showed large hiatal hernia, large paraesophageal hernia. Recommended surgical consultation for hernia repair.  - Continue PPI - Patient underwent hernia repair today.  Dehydration -Improving. Continue IV fluid  hydration  Leukocytosis - Likely reflective of dehydration. Lactic acid normal. Now normal.  Protein calorie malnutrition, hypophosphatemia - Repleted phosphorous, now level close to normal  Retroperitoneal mass-large calcified mass, present since 2015 -Has been previously evaluated by Dr. Donne Hazel in the outpatient setting (see ER documentation from 9/2 regarding details)   Code Status: full  DVT Prophylaxis: Lovenox Family Communication: Patient seen in PACU. He was groggy. Disposition Plan: Underwent surgery today. Further plans per general surgery.  Time Spent in minutes   25 minutes  Procedures:  EGD   Consultants:   GI General surgery   Antimicrobials:      Medications  Scheduled Meds: . acetaminophen      . [MAR Hold] enoxaparin (LOVENOX) injection  40 mg Subcutaneous Q24H  . [MAR Hold] famotidine (PEPCID) IV  20 mg Intravenous Q12H  . [MAR Hold] feeding supplement  1 Container Oral TID BM  . [MAR Hold] folic acid  1 mg Intravenous Daily  . [MAR Hold] pantoprazole  40 mg Intravenous Q12H  . [MAR Hold] sucralfate  1 g Oral TID WC & HS  . [MAR Hold] thiamine  100 mg Intravenous Daily   Continuous Infusions: . sodium chloride 75 mL/hr at 12/26/15 0046  . lactated ringers 10 mL/hr at  12/26/15 0913   PRN Meds:.[MAR Hold] acetaminophen **OR** [MAR Hold] acetaminophen, [MAR Hold] chlorproMAZINE (THORAZINE) IV, [MAR Hold] fentaNYL (SUBLIMAZE) injection, HYDROmorphone (DILAUDID) injection, HYDROmorphone (DILAUDID) injection, HYDROmorphone (DILAUDID) injection, [MAR Hold] ondansetron (ZOFRAN) IV, ondansetron (ZOFRAN) IV, promethazine   Antibiotics   Anti-infectives    Start     Dose/Rate Route Frequency Ordered Stop   12/26/15 1200  metroNIDAZOLE (FLAGYL) IVPB 500 mg     500 mg 100 mL/hr over 60 Minutes Intravenous To Surgery 12/26/15 1149 12/26/15 1147   12/26/15 0800  [MAR Hold]  ceFAZolin (ANCEF) IVPB 2g/100 mL premix     (MAR Hold since 12/26/15 0854)   2  g 200 mL/hr over 30 Minutes Intravenous To ShortStay Surgical 12/25/15 1421 12/26/15 1124        Subjective:   Patient seen in PACU. He was still groggy due to anesthesia. Complains of pain.    Objective:   Vitals:   12/26/15 0915 12/26/15 0922 12/26/15 1453 12/26/15 1500  BP: (!) 174/101 (!) 169/98  (!) 152/95  Pulse:      Resp:      Temp:   97.7 F (36.5 C)   TempSrc:      SpO2:      Weight:      Height:        Intake/Output Summary (Last 24 hours) at 12/26/15 1543 Last data filed at 12/26/15 1430  Gross per 24 hour  Intake           4532.5 ml  Output             1570 ml  Net           2962.5 ml     Wt Readings from Last 3 Encounters:  12/23/15 77.1 kg (170 lb)  12/22/15 77.1 kg (170 lb)  02/11/15 81.6 kg (180 lb)     Exam  General: Groggy  Cardiovascular: S1 S2 normal regular.   Respiratory: CTA bilaterally  Gastrointestinal: Soft, NT nondistended, sluggish bowel sounds. Drain noted.  Ext: no cyanosis clubbing or edema  Neuro: no focal deficits   Data Reviewed:  I have personally reviewed following labs and imaging studies  Micro Results Recent Results (from the past 240 hour(s))  Surgical pcr screen     Status: None   Collection Time: 12/25/15 11:29 PM  Result Value Ref Range Status   MRSA, PCR NEGATIVE NEGATIVE Final   Staphylococcus aureus NEGATIVE NEGATIVE Final    Comment:        The Xpert SA Assay (FDA approved for NASAL specimens in patients over 68 years of age), is one component of a comprehensive surveillance program.  Test performance has been validated by [facility] for patients greater than or equal to 8 year old. It is not intended to diagnose infection nor to guide or monitor treatment.     Radiology Reports Dg Chest 2 View  Result Date: 12/22/2015 CLINICAL DATA:  Chest and upper abdomen pain starting yesterday EXAM: CHEST  2 VIEW COMPARISON:  May 08, 2007 FINDINGS: The heart size and mediastinal contours are  within normal limits. There is no focal infiltrate, pulmonary edema, or pleural effusion. The visualized skeletal structures are stable. There is a large hiatal hernia. IMPRESSION: No active cardiopulmonary disease. Electronically Signed   By: Abelardo Diesel M.D.   On: 12/22/2015 11:05   Ct Abdomen Pelvis W Contrast  Result Date: 12/22/2015 CLINICAL DATA:  Epigastric pain when trying to eat or drink. Symptoms began yesterday when he  ate lunch. EXAM: CT ABDOMEN AND PELVIS WITH CONTRAST TECHNIQUE: Multidetector CT imaging of the abdomen and pelvis was performed using the standard protocol following bolus administration of intravenous contrast. CONTRAST:  100 cc Isovue-300 COMPARISON:  Lumbar spine films 08/30/2010, chest x-ray 12/22/2015 FINDINGS: Lower chest: There is small amount of scarring at both lung bases. Heart size is normal. Minimal atherosclerosis at the level of the aortic valve. There is a large hiatal hernia. Hepatobiliary: Posterior to liver, superior to the right kidney, and lateral to the adrenal gland, there is a hyperdense calcified mass or collection contrast which measures 8.8 x 10.0 x 6.9 cm. Correlation with history is recommended. Has the patient had interventional procedures performed in the right upper quadrant? Pancreas: Normal in appearance. Spleen: Normal in appearance. Renal/Adrenal: The right adrenal gland appears to be normal in appearance but is in close proximity to the collection or calcified mass described above. The left adrenal gland is normal in appearance. The kidneys are normal in appearance. There is normal excretion and enhancement bilaterally. Gastrointestinal tract: Large hiatal hernia is present. Small bowel loops are normal in appearance. Numerous colonic diverticular are present. Status post appendectomy. Reproductive/Pelvis: Urinary bladder is normal in appearance. The prostate is enlarged. Seminal vesicles are symmetric. No free pelvic fluid. Vascular/Lymphatic:  There is atherosclerotic calcification of the abdominal aorta. The lower thoracic aorta is tortuous. No retroperitoneal or mesenteric adenopathy. Musculoskeletal/Abdominal wall: Abdominal wall is unremarkable. Status post anterior fusion of the lumbar spine at L4-5 and L5-S1. Superior endplate fracture at L2 and wedge compression fracture of L1 or chronic and stable in appearance. Vacuum disc phenomenon at L3-4. Other: none IMPRESSION: 1. Calcified mass or collection of contrast measuring 10 cm in the right upper quadrant retroperitoneum. Recommend correlation with history guarding any prior interventional procedures, contrast injection. The mass has a benign appearance and is unlikely causing the patient's symptoms. 2. Large hiatal hernia. 3.  Aortic atherosclerosis. 4. Enlarged prostate. 5. Prior spinal surgery and spondylosis. Electronically Signed   By: Nolon Nations M.D.   On: 12/22/2015 15:06   Dg Chest Port 1 View  Result Date: 12/23/2015 CLINICAL DATA:  Mid chest pain. EXAM: PORTABLE CHEST 1 VIEW COMPARISON:  December 22, 2015 FINDINGS: The heart size and mediastinal contours are within normal limits. There is no focal infiltrate, pulmonary edema, or pleural effusion. Hiatal hernia is identified. Overlying soft tissue is projected over the upper chest bilaterally. The visualized skeletal structures are stable. IMPRESSION: No active cardiopulmonary disease. Electronically Signed   By: Abelardo Diesel M.D.   On: 12/23/2015 09:08    Lab Data:  CBC:  Recent Labs Lab 12/22/15 1015 12/23/15 0851 12/24/15 0234 12/25/15 0540 12/26/15 0439  WBC 11.2* 13.0* 12.5* 7.2 6.9  NEUTROABS 9.8*  --   --   --   --   HGB 15.7 15.5 13.8 13.1 13.3  HCT 44.8 45.6 41.1 39.8 39.4  MCV 93.5 95.4 96.3 96.6 94.7  PLT 252 238 220 199 481   Basic Metabolic Panel:  Recent Labs Lab 12/22/15 1015 12/23/15 0851 12/23/15 1342 12/24/15 0234 12/25/15 0540 12/26/15 0439  NA 139 138  --  138 140 136  K 3.5 3.6   --  3.8 3.4* 3.4*  CL 100* 103  --  106 107 99*  CO2 27 24  --  '27 28 24  '$ GLUCOSE 127* 144*  --  115* 85 74  BUN 13 14  --  '12 11 11  '$ CREATININE 0.91 1.11  --  0.88 0.94 0.96  CALCIUM 10.0 9.5  --  8.8* 8.5* 8.2*  MG  --   --  1.8  --   --   --   PHOS  --   --  1.6* 3.2  --   --    GFR: Estimated Creatinine Clearance: 78.1 mL/min (by C-G formula based on SCr of 0.96 mg/dL). Liver Function Tests:  Recent Labs Lab 12/22/15 1015 12/23/15 0851 12/24/15 0234  AST '21 26 19  '$ ALT 17 16* 14*  ALKPHOS 95 96 79  BILITOT 0.8 1.1 0.6  PROT 6.8 6.6 5.8*  ALBUMIN 4.2 4.1 3.6    Recent Labs Lab 12/22/15 1015 12/23/15 0851  LIPASE 76* 55*   Thyroid Function Tests:  Recent Labs  12/24/15 0234  TSH 2.014     Leanna Hamid M.D. [facility] 12/26/2015, 3:43 PM  Pager: [phone]  Between 7am to 7pm - call Pager - [phone]  After 7pm go to [email or web address] - password [email or web address]  Call night coverage person covering after 7pm

## 2015-12-26 NOTE — Anesthesia Postprocedure Evaluation (Signed)
Anesthesia Post Note  Patient: MERIL DRAY  Procedure(s) Performed: Procedure(s) (LRB): LAPAROSCOPIC REPAIR OF HIATAL HERNIA WITH MESH (N/A)  Patient location during evaluation: PACU Anesthesia Type: General Level of consciousness: awake and alert Pain management: pain level controlled Vital Signs Assessment: post-procedure vital signs reviewed and stable Respiratory status: spontaneous breathing, nonlabored ventilation, respiratory function stable and patient connected to nasal cannula oxygen Cardiovascular status: blood pressure returned to baseline and stable Postop Assessment: no signs of nausea or vomiting Anesthetic complications: no    Last Vitals:  Vitals:   12/26/15 1606 12/26/15 1608  BP: (!) 160/102 (!) 169/98  Pulse: 72 78  Resp: 15 18  Temp:      Last Pain:  Vitals:   12/26/15 1550  TempSrc:   PainSc: 6                  Zenaida Deed

## 2015-12-26 NOTE — Progress Notes (Addendum)
William Avila  [birth year] [id number]  Patient Care Team: [name], MD as PCP - General (Internal Medicine) Otis Brace, MD as Consulting Physician (Gastroenterology)  This patient is a 70 y.o.male who ready for surgical evaluation.   General: Pt awake/alert/oriented x4 in no major acute distress Eyes: PERRL, normal EOM. Sclera nonicteric Neuro: CN II-XII intact w/o focal sensory/motor deficits. Lymph: No head/neck/groin lymphadenopathy Psych:  No delerium/psychosis/paranoia HENT: Normocephalic, Mucus membranes moist.  No thrush Neck: Supple, No tracheal deviation Chest: No pain.  Good respiratory excursion. CV:  Pulses intact.  Regular rhythm MS: Normal AROM mjr joints.  No obvious deformity Abdomen: Soft, Nondistended.  Nontender.  No incarcerated hernias. Ext:  SCDs BLE.  No significant edema.  No cyanosis Skin: No petechiae / purpura   The anatomy & physiology of the foregut and anti-reflux mechanism was discussed.  The pathophysiology of hiatal herniation and GERD was discussed.  Natural history risks without surgery was discussed.   The patient's symptoms are not adequately controlled by medicines and other non-operative treatments.  I feel the risks of no intervention will lead to serious problems that outweigh the operative risks; therefore, I recommended surgery to reduce the hiatal hernia out of the chest and fundoplication to rebuild the anti-reflux valve and control reflux better.  Need for a thorough workup to rule out the differential diagnosis and plan treatment was explained.  I explained minimally invasive techniques with possible need for an open approach.  Risks such as bleeding, infection, abscess, leak,injury to other organs, need for repair of tissues / organs, need for further treatment, stroke, heart attack, death, and other risks were discussed.   I noted a good likelihood this will help address the problem.  Goals of post-operative recovery were  discussed as well.  Possibility that this will not correct all symptoms was explained.  Post-operative dysphagia, need for short-term liquid & pureed diet, inability to vomit, possibility of reherniation, possible need for medicines to help control symptoms in addition to surgery were discussed.  We will work to minimize complications.   Educational handouts further explaining the pathology, treatment options, and dysphagia diet was given as well.  Questions were answered.  The patient expresses understanding & wishes to proceed with surgery.       Patient Active Problem List   Diagnosis Date Noted  . Epigastric pain 12/23/2015  . Hiatal hernia 12/23/2015  . Dehydration 12/23/2015  . Protein calorie malnutrition ([place]) 12/23/2015  . Leukocytosis 12/23/2015  . Retroperitoneal mass-large calcified mass, present since 2015 12/23/2015  . Acute appendicitis 01/21/2014  . Appendicitis 01/20/2014    Past Medical History:  Diagnosis Date  . Arthritis   . History of hiatal hernia     Past Surgical History:  Procedure Laterality Date  . BACK SURGERY     and neck and trigger finger surgery  . CERVICAL SPINE SURGERY    . ESOPHAGOGASTRODUODENOSCOPY (EGD) WITH PROPOFOL Left 12/24/2015   Procedure: ESOPHAGOGASTRODUODENOSCOPY (EGD) WITH PROPOFOL;  Surgeon: Otis Brace, MD;  Location: [facility];  Service: Gastroenterology;  Laterality: Left;  . LAPAROSCOPIC APPENDECTOMY N/A 01/21/2014   Procedure: APPENDECTOMY LAPAROSCOPIC;  Surgeon: Autumn Messing III, MD;  Location: [facility];  Service: General;  Laterality: N/A;  . TRIGGER FINGER RELEASE      Social History   Social History  . Marital status: Married    Spouse name: N/A  . Number of children: N/A  . Years of education: N/A   Occupational History  . Not on file.  Social History Main Topics  . Smoking status: Former Smoker    Types: Cigarettes  . Smokeless tobacco: Never Used  . Alcohol use Yes     Comment: Occassionally  . Drug  use: No  . Sexual activity: Not on file   Other Topics Concern  . Not on file   Social History Narrative  . No narrative on file    History reviewed. No pertinent family history.  Current Facility-Administered Medications  Medication Dose Route Frequency Provider Last Rate Last Dose  . 0.9 %  sodium chloride infusion   Intravenous Continuous Samella Parr, NP 75 mL/hr at 12/26/15 0046    . 0.9 % irrigation (POUR BTL)    PRN Michael Boston, MD   1,000 mL at 12/26/15 1001  . [MAR Hold] acetaminophen (TYLENOL) tablet 650 mg  650 mg Oral Q6H PRN Samella Parr, NP       Or  . Doug Sou Hold] acetaminophen (TYLENOL) suppository 650 mg  650 mg Rectal Q6H PRN Samella Parr, NP      . Doug Sou Hold] ceFAZolin (ANCEF) IVPB 2g/100 mL premix  2 g Intravenous To SS-Surg Donnie Mesa, MD      . Doug Sou Hold] chlorproMAZINE (THORAZINE) 25 mg in sodium chloride 0.9 % 25 mL IVPB  25 mg Intravenous Q6H PRN Ripudeep Krystal Eaton, MD   25 mg at 12/24/15 1240  . [MAR Hold] enoxaparin (LOVENOX) injection 40 mg  40 mg Subcutaneous Q24H Samella Parr, NP   40 mg at 12/25/15 1255  . [MAR Hold] famotidine (PEPCID) IVPB 20 mg premix  20 mg Intravenous Q12H Samella Parr, NP   20 mg at 12/26/15 0824  . [MAR Hold] feeding supplement (BOOST / RESOURCE BREEZE) liquid 1 Container  1 Container Oral TID BM Samella Parr, NP   1 Container at 12/23/15 2006  . [MAR Hold] fentaNYL (SUBLIMAZE) injection 12.5 mcg  12.5 mcg Intravenous Q2H PRN Samella Parr, NP   12.5 mcg at 12/25/15 2319  . [MAR Hold] folic acid injection 1 mg  1 mg Intravenous Daily Samella Parr, NP   1 mg at 12/26/15 0824  . HYDROmorphone (DILAUDID) injection 0.25-0.5 mg  0.25-0.5 mg Intravenous Q5 min PRN Duane Boston, MD      . HYDROmorphone (DILAUDID) injection 0.25-0.5 mg  0.25-0.5 mg Intravenous Q5 min PRN Roderic Palau, MD      . lactated ringers infusion   Intravenous Continuous Roderic Palau, MD 10 mL/hr at 12/26/15 0913    . [MAR Hold]  ondansetron (ZOFRAN) injection 4 mg  4 mg Intravenous Q6H PRN Samella Parr, NP   4 mg at 12/25/15 2319  . [MAR Hold] pantoprazole (PROTONIX) injection 40 mg  40 mg Intravenous Q12H Parag Brahmbhatt, MD      . promethazine (PHENERGAN) injection 6.25-12.5 mg  6.25-12.5 mg Intravenous Q15 min PRN Duane Boston, MD      . Doug Sou Hold] sucralfate (CARAFATE) 1 GM/10ML suspension 1 g  1 g Oral TID WC & HS Samella Parr, NP   1 g at 12/25/15 2131  . [MAR Hold] thiamine (B-1) injection 100 mg  100 mg Intravenous Daily Samella Parr, NP   100 mg at 12/26/15 0932     Allergies  Allergen Reactions  . Codeine Anaphylaxis  . Ibuprofen Anaphylaxis  . Robaxin [Methocarbamol] Anaphylaxis, Hives and Swelling    BP (!) 169/98   Pulse 62   Temp 98.6 F (37 C) (Oral)   Resp  18   Ht 6' (1.829 m)   Wt 77.1 kg (170 lb)   SpO2 98%   BMI 23.06 kg/m   Dg Chest 2 View  Result Date: 12/22/2015 CLINICAL DATA:  Chest and upper abdomen pain starting yesterday EXAM: CHEST  2 VIEW COMPARISON:  May 08, 2007 FINDINGS: The heart size and mediastinal contours are within normal limits. There is no focal infiltrate, pulmonary edema, or pleural effusion. The visualized skeletal structures are stable. There is a large hiatal hernia. IMPRESSION: No active cardiopulmonary disease. Electronically Signed   By: Abelardo Diesel M.D.   On: 12/22/2015 11:05   Ct Abdomen Pelvis W Contrast  Result Date: 12/22/2015 CLINICAL DATA:  Epigastric pain when trying to eat or drink. Symptoms began yesterday when he ate lunch. EXAM: CT ABDOMEN AND PELVIS WITH CONTRAST TECHNIQUE: Multidetector CT imaging of the abdomen and pelvis was performed using the standard protocol following bolus administration of intravenous contrast. CONTRAST:  100 cc Isovue-300 COMPARISON:  Lumbar spine films 08/30/2010, chest x-ray 12/22/2015 FINDINGS: Lower chest: There is small amount of scarring at both lung bases. Heart size is normal. Minimal atherosclerosis at  the level of the aortic valve. There is a large hiatal hernia. Hepatobiliary: Posterior to liver, superior to the right kidney, and lateral to the adrenal gland, there is a hyperdense calcified mass or collection contrast which measures 8.8 x 10.0 x 6.9 cm. Correlation with history is recommended. Has the patient had interventional procedures performed in the right upper quadrant? Pancreas: Normal in appearance. Spleen: Normal in appearance. Renal/Adrenal: The right adrenal gland appears to be normal in appearance but is in close proximity to the collection or calcified mass described above. The left adrenal gland is normal in appearance. The kidneys are normal in appearance. There is normal excretion and enhancement bilaterally. Gastrointestinal tract: Large hiatal hernia is present. Small bowel loops are normal in appearance. Numerous colonic diverticular are present. Status post appendectomy. Reproductive/Pelvis: Urinary bladder is normal in appearance. The prostate is enlarged. Seminal vesicles are symmetric. No free pelvic fluid. Vascular/Lymphatic: There is atherosclerotic calcification of the abdominal aorta. The lower thoracic aorta is tortuous. No retroperitoneal or mesenteric adenopathy. Musculoskeletal/Abdominal wall: Abdominal wall is unremarkable. Status post anterior fusion of the lumbar spine at L4-5 and L5-S1. Superior endplate fracture at L2 and wedge compression fracture of L1 or chronic and stable in appearance. Vacuum disc phenomenon at L3-4. Other: none IMPRESSION: 1. Calcified mass or collection of contrast measuring 10 cm in the right upper quadrant retroperitoneum. Recommend correlation with history guarding any prior interventional procedures, contrast injection. The mass has a benign appearance and is unlikely causing the patient's symptoms. 2. Large hiatal hernia. 3.  Aortic atherosclerosis. 4. Enlarged prostate. 5. Prior spinal surgery and spondylosis. Electronically Signed   By:  Nolon Nations M.D.   On: 12/22/2015 15:06   Dg Chest Port 1 View  Result Date: 12/23/2015 CLINICAL DATA:  Mid chest pain. EXAM: PORTABLE CHEST 1 VIEW COMPARISON:  December 22, 2015 FINDINGS: The heart size and mediastinal contours are within normal limits. There is no focal infiltrate, pulmonary edema, or pleural effusion. Hiatal hernia is identified. Overlying soft tissue is projected over the upper chest bilaterally. The visualized skeletal structures are stable. IMPRESSION: No active cardiopulmonary disease. Electronically Signed   By: Abelardo Diesel M.D.   On: 12/23/2015 09:08    Note: This dictation was prepared with Dragon/digital dictation along with [employer]. Any transcriptional errors that result from this process are unintentional.

## 2015-12-26 NOTE — Transfer of Care (Addendum)
Immediate Anesthesia Transfer of Care Note  Patient: William Avila  Procedure(s) Performed: Procedure(s): LAPAROSCOPIC REPAIR OF HIATAL HERNIA WITH MESH (N/A)  Patient Location: PACU  Anesthesia Type:General  Level of Consciousness: awake, confused  Airway & Oxygen Therapy: Patient Spontanous Breathing and Patient connected to nasal cannula oxygen  Post-op Assessment: Report given to RN, Post -op Vital signs reviewed and stable and Patient moving all extremities X 4  Post vital signs: Reviewed and stable  Last Vitals:  Vitals:   12/26/15 0922 12/26/15 1453  BP: (!) 169/98   Pulse:    Resp:    Temp:  36.5 C    Last Pain:  Vitals:   12/26/15 0848  TempSrc: Oral  PainSc:       Patients Stated Pain Goal: 2 (47/82/95 [id number])  Complications: No apparent anesthesia complications

## 2015-12-26 NOTE — Progress Notes (Signed)
2 Days Post-Op  Subjective: Patient is asymptomatic this morning.  Surgery planned for today by Dr. Johney Maine  Objective: Vital signs in last 24 hours: Temp:  [98.3 F (36.8 C)-98.7 F (37.1 C)] 98.3 F (36.8 C) (09/06 0500) Pulse Rate:  [50-55] 53 (09/06 0500) Resp:  [18] 18 (09/06 0500) BP: (158-182)/(91-102) 160/91 (09/06 0500) SpO2:  [96 %-99 %] 96 % (09/06 0500) Last BM Date: 12/22/15  Intake/Output from previous day: 09/05 0701 - 09/06 0700 In: 1850 [I.V.:1800; IV Piggyback:50] Out: 1350 [Urine:1350] Intake/Output this shift: Total I/O In: 180 [I.V.:130; IV Piggyback:50] Out: -   GI: minimal epigastric discomfort  Lab Results:   Recent Labs  12/25/15 0540 12/26/15 0439  WBC 7.2 6.9  HGB 13.1 13.3  HCT 39.8 39.4  PLT 199 214   BMET  Recent Labs  12/25/15 0540 12/26/15 0439  NA 140 136  K 3.4* 3.4*  CL 107 99*  CO2 28 24  GLUCOSE 85 74  BUN 11 11  CREATININE 0.94 0.96  CALCIUM 8.5* 8.2*   PT/INR No results for input(s): LABPROT, INR in the last 72 hours. ABG No results for input(s): PHART, HCO3 in the last 72 hours.  Invalid input(s): PCO2, PO2  Studies/Results: No results found.  Anti-infectives: Anti-infectives    Start     Dose/Rate Route Frequency Ordered Stop   12/26/15 0800  ceFAZolin (ANCEF) IVPB 2g/100 mL premix     2 g 200 mL/hr over 30 Minutes Intravenous To ShortStay Surgical 12/25/15 1421 12/27/15 0800      Assessment/Plan: s/p Procedure(s): ESOPHAGOGASTRODUODENOSCOPY (EGD) WITH PROPOFOL (Left) Laparoscopic repair of hiatal hernia today by Dr. Johney Maine   LOS: 1 day    Florabel Faulks K. 12/26/2015

## 2015-12-26 NOTE — Anesthesia Procedure Notes (Signed)
Procedure Name: Intubation Date/Time: 12/26/2015 11:18 AM Performed by: [place] Pink Pre-anesthesia Checklist: Emergency Drugs available, Patient identified, Suction available, Timeout performed and Patient being monitored Patient Re-evaluated:Patient Re-evaluated prior to inductionOxygen Delivery Method: Circle system utilized Preoxygenation: Pre-oxygenation with 100% oxygen Intubation Type: IV induction Ventilation: Mask ventilation without difficulty Laryngoscope Size: Mac and 4 Grade View: Grade II Tube type: Oral Tube size: 7.5 mm Number of attempts: 1 Airway Equipment and Method: Stylet Placement Confirmation: ETT inserted through vocal cords under direct vision,  positive ETCO2 and breath sounds checked- equal and bilateral Secured at: 23 cm Tube secured with: Tape Dental Injury: Teeth and Oropharynx as per pre-operative assessment

## 2015-12-26 NOTE — Op Note (Addendum)
12/26/2015  3:11 PM  PATIENT:  William Avila  70 y.o. male  Patient Care Team: [name], MD as PCP - General (Internal Medicine) Otis Brace, MD as Consulting Physician (Gastroenterology) Michael Boston, MD as Consulting Physician (General Surgery)  PRE-OPERATIVE DIAGNOSIS:  INCARCERATED HIATAL HERNIA  POST-OPERATIVE DIAGNOSIS:   INCARCERATED HIATAL HERNIA  PROCEDURE:    1. Laparoscopic reduction of paraesophageal hiatal hernia 2. Type II mediastinal dissection. 3. Primary repair of hiatal hernia over pledgets. 4 Mesh reinforcement with absorbable Phasix mesh 5. Anterior & posterior gastropexy. 6. Nissen fundoplication 2 cm over a 56-French bougie  SURGEON:  Surgeon(s): Michael Boston, MD Clovis Riley, MD - Assist  ANESTHESIA:   local and general  EBL:  Total I/O In: 2682.5 [I.V.:2332.5; IV Piggyback:350] Out: 220 [Urine:200; Blood:20]  Delay start of Pharmacological VTE agent (>24hrs) due to surgical blood loss or risk of bleeding:  no  ANESTHESIA: 1. General anesthesia. 2. Local anesthetic in a field block around all port sites.  SPECIMEN:  Mediastinal hernia sac (not sent).  DRAINS:  A 19-French Blake drain goes from the right upper quadrant along the lesser curvature of the stomach into the mediastinum.  COUNTS:  YES  PLAN OF CARE: Admit to inpatient   PATIENT DISPOSITION:  PACU - hemodynamically stable.  INDICATION:   Patient with symptomatic paraesophageal hiatal hernia.  Esophagitis with most of stomach incarcerated in the chest with worsening nausea and vomiting and postprandial pain.  The patient has had extensive work-up & we feel the patient will benefit from repair:  The anatomy & physiology of the foregut and anti-reflux mechanism was discussed.  The pathophysiology of hiatal herniation and GERD was discussed.  Natural history risks without surgery was discussed.   The patient's symptoms are not adequately controlled by medicines and  other non-operative treatments.  I feel the risks of no intervention will lead to serious problems that outweigh the operative risks; therefore, I recommended surgery to reduce the hiatal hernia out of the chest and fundoplication to rebuild the anti-reflux valve and control reflux better.  Need for a thorough workup to rule out the differential diagnosis and plan treatment was explained.  I explained laparoscopic techniques with possible need for an open approach.  Risks such as bleeding, infection, abscess, leak, need for further treatment, heart attack, death, and other risks were discussed.   I noted a good likelihood this will help address the problem.  Goals of post-operative recovery were discussed as well.  Possibility that this will not correct all symptoms was explained.  Post-operative dysphagia, need for short-term liquid & pureed diet, inability to vomit, possibility of reherniation, possible need for medicines to help control symptoms in addition to surgery were discussed.  We will work to minimize complications.   Educational handouts further explaining the pathology, treatment options, and dysphagia diet was given as well.  Questions were answered.  The patient expresses understanding & wishes to proceed with surgery.  OR FINDINGS:   Moderate-sized paraesophageal hiatal hernia with 65% of the stomach in the mediastinum.  There was a 10 x 8 cm hiatal defect.  It is a primary repair over pledgets.   Patient has mesh reinforcement of the closure using 10x15 cm absorbable Phasix mesh (a knitted monofilament mesh scaffold using Poly-4-hydroxybutyrate (P4HB), a biologically derived, fully resorbable material.)   The patient has a 1 cm Nissen fundoplication that was done over 56-French bougie.  The patient has had anterior and posterior gastropexy.  DESCRIPTION:  Informed consent was confirmed.  The patient received IV antibiotics prior to incision.  The underwent general anesthesia  without difficulty.  A Foley catheter sterilely placed.  The patient was positioned in split leg with arms tucked. The abdomen was prepped and draped in the sterile fashion.  Surgical time-out confirmed our plan.  I placed a 5 mm port in the left upper quadrant paramedian region using optical entry technique with the patient in steep reverse Trendelenburg and left side up.  Entry was clean.  We induced carbon dioxide insufflation.  Camera inspection revealed no injury.  Under direct visualization, I placed 5 mm port in the right mid abdomen and a 10 mm port on the anterior axillary line on the left subcostal ridge.  I also placed a 5 mm port in the left subxiphoid region under direct visualization.  I removed that and placed an Omega-shaped rigid Nathanson liver retractor to lift the left lateral sector of the liver anteriorly to expose the esophageal hiatus.  This was secured to the bed using the iron man system.  I placed another 62m port in the subxiphoid region.  We grasped the anterior mediastinal sac at the apex of the crus.  I scored through that and got into the anterior mediastinum.  I was able to free the mediastinal sac from its attachments to the pericardium and bilateral pleura using primarily focused gentle blunt dissection as well as focused ultrasonic Harmonic dissection.  It was rather thickened and inflamed with edema consistent with his worsening symptoms and chronic incarceration.  I transected phrenoesophageal attachments to the inner right crus, preserving a two centimeter cuff of mediastinal sac until I found the base of the crura.  I then came around anteriorly on the left side and freed up the phrenoesophageal attachments of the mediastinal sac on the medial part of the left crus on the superior half.  I did careful mediastinal dissection to free the mediastinal sac.  With that, we could relieve the suction cup affect of the hernia sac and help reduce the stomach back down into the  abdomen, flipped back approriately.    We ligated the short gastrics along the lesser curvature of the stomach about a third way down and then came up proximally over the fundus.  We released the attachments of the stomach to the retroperitoneum until we were able to connect with the prior dissection on the left crus.  We completed the release of phrenoesophageal attachments to the medial part of the left crus down to its base.  The left anterior mediastinal sac was particularly thickened.  With this, we had circumferential mobilization.    We placed the stomach and esophagus on axial tension.  I then did a Type II mediastinal dissection where I freed the esophagus from its attachments to the aorta, spine, pleura, and pericardium using primarily gentle blunt as well as focused ultrasonic dissection.  We saw the anterior & posterior vagus nerves intact.  Anterior vagus nerve was rather thin and in many branches.  He was easier to preserve the right posterior lateral vagus nerve.  I procedded to dissect about 20 cm proximally into the mediastinum.  With that I could straighten out the esophagus and get 3 cm of intra-abdominal length of the esophagus at a best estimation.  I freed the anterior mediastinal sac off the esophagus & stomach.  We saw a small but intact branch of the the anterior vagus nerve and freed the sac off of the vagus.  I dissected out & removed the fatty  epiphrenic pads at the esophagogastric junction.  Rather thickened but eventually was able to skeletonize and free off With that, I could better define the esophagogastric junction.  Posterior vagus nerve intact.  I confirmed the the patient had 3 cm of intra-abdominal esophageal length off tension.  I brought the fundus of the stomach posterior to the esophagus over to the right side.  The wrap was mobile with the classic shoe shine maneuver.  Wrap became together gently.  We reflected the stomach left laterally and closed the esophageal  hiatus using 0 Ethibond stitch using horizontal mattress stitches with pledgets on both sides.  I did that x4 stitches.  The crura had good substance and they came together well without any tension.  However there was not much of a visceral peritoneum over it.  Therefore I reinforced the repair with a biologic absorbable mesh.  10 x 15 cm Phasix.  I cut a defect in the mesh transversely such that it looked like a "U" with a narrow and a broader tail long lateral tail.  I rolled the mesh and placed it into the peritoneal cavity.  I positioned the mesh posterior to the stomach and esophagus to overlay the hiatal closure.  The more narrow arm laid over the right crus.   The more broad arm over the left diaphragm.   I tucked the left inferolateral corner between the diaphragm and the spleen.  I sutured the mesh with 0 Ethibond interrupted sutures.  I did this to help secure the mesh at the corners: the left upper lateral corner, left upper inner corner.  Right upper inner corner.  Right inferior lateral corner.  I then placed #1 Ethibond sutures interrupted x 2in the mesh in and out the closure and out the mesh and tied that down for good posterior attachment  I reset up the and Nissen wrap and did a posterior gastropexy by taking of #1 Ethibond stitches to the posterior part of the right side of the wrap and thru the crural closure and tied that down for good posterior gastropexy up against the hiatal closure.  I then did a left anterior gastropexy taking the apex of the left side of the wrap and tacked it to the left anterior crura just posterior to the phrenic vein. I tied that down.  This helped the wrap for completion.    Next, Anesthesia passed a 56-French bougie transorally.  It was a lighted bougie and we did do this under axial tension.  It passed down easily without resistance.    I then did a classic, but 1.5 cm short, Nissen fundoplication on the true esophagus above the cardia using Ethibond stitch in  the left side of the wrap, then anterior esophagus, and then right side of the wrap and tied that down. Did 2 stitches.  I measured it and it was 1.5 cm in length.  We removed the bougie.  It was intact.    The wrap was soft and floppy.  I placed a drain as noted above.  I did irrigation and ensured hemostasis.  I saw no evidence of any leak or perforation or other abnormality.  I removed the [facility] liver retractor under direct visualization.  I evacuated carbon dioxide and removed the ports.  The skin was closed with Monocryl and sterile dressings applied.  The patient is being extubated and brought back to the recovery room.  I discussed postop care in detail with  the patient  in the holding area. I discussed operative findings, updated the patient's status, discussed probable steps to recovery, and gave postoperative recommendations to the patient's family.  Recommendations were made.  Questions were answered.  They expressed understanding & appreciation.   Adin Hector, M.D., F.A.C.S. Gastrointestinal and Minimally Invasive Surgery [facility] [address], [place],  [address] [phone] Main / Paging

## 2015-12-26 NOTE — Anesthesia Preprocedure Evaluation (Addendum)
Anesthesia Evaluation  Patient identified by MRN, date of birth, ID band Patient awake    Reviewed: Allergy & Precautions, H&P , NPO status , Patient's Chart, lab work & pertinent test results  Airway Mallampati: II  TM Distance: >3 FB Neck ROM: Full    Dental no notable dental hx. (+) Teeth Intact, Dental Advisory Given   Pulmonary neg pulmonary ROS, former smoker,    Pulmonary exam normal breath sounds clear to auscultation       Cardiovascular negative cardio ROS   Rhythm:Regular Rate:Normal     Neuro/Psych negative neurological ROS  negative psych ROS   GI/Hepatic Neg liver ROS, hiatal hernia,   Endo/Other  negative endocrine ROS  Renal/GU negative Renal ROS  negative genitourinary   Musculoskeletal  (+) Arthritis , Osteoarthritis,    Abdominal   Peds  Hematology negative hematology ROS (+)   Anesthesia Other Findings   Reproductive/Obstetrics negative OB ROS                            Anesthesia Physical Anesthesia Plan  ASA: II  Anesthesia Plan: General   Post-op Pain Management:    Induction: Intravenous  Airway Management Planned: Oral ETT  Additional Equipment:   Intra-op Plan:   Post-operative Plan: Extubation in OR  Informed Consent: I have reviewed the patients History and Physical, chart, labs and discussed the procedure including the risks, benefits and alternatives for the proposed anesthesia with the patient or authorized representative who has indicated his/her understanding and acceptance.   Dental advisory given  Plan Discussed with: CRNA  Anesthesia Plan Comments:         Anesthesia Quick Evaluation

## 2015-12-27 ENCOUNTER — Inpatient Hospital Stay (HOSPITAL_COMMUNITY): Payer: Medicare HMO

## 2015-12-27 ENCOUNTER — Encounter (HOSPITAL_COMMUNITY): Payer: Self-pay | Admitting: Surgery

## 2015-12-27 DIAGNOSIS — K440 Diaphragmatic hernia with obstruction, without gangrene: Secondary | ICD-10-CM

## 2015-12-27 DIAGNOSIS — R030 Elevated blood-pressure reading, without diagnosis of hypertension: Secondary | ICD-10-CM

## 2015-12-27 LAB — CBC
HCT: 39.9 % (ref 39.0–52.0)
Hemoglobin: 13.6 g/dL (ref 13.0–17.0)
MCH: 32.2 pg (ref 26.0–34.0)
MCHC: 34.1 g/dL (ref 30.0–36.0)
MCV: 94.5 fL (ref 78.0–100.0)
Platelets: 229 10*3/uL (ref 150–400)
RBC: 4.22 MIL/uL (ref 4.22–5.81)
RDW: 12.2 % (ref 11.5–15.5)
WBC: 10.6 10*3/uL — ABNORMAL HIGH (ref 4.0–10.5)

## 2015-12-27 LAB — BASIC METABOLIC PANEL
Anion gap: 9 (ref 5–15)
BUN: 8 mg/dL (ref 6–20)
CO2: 27 mmol/L (ref 22–32)
Calcium: 8.6 mg/dL — ABNORMAL LOW (ref 8.9–10.3)
Chloride: 100 mmol/L — ABNORMAL LOW (ref 101–111)
Creatinine, Ser: 0.9 mg/dL (ref 0.61–1.24)
GFR calc Af Amer: >60 mL/min (ref >60)
GFR calc non Af Amer: >60 mL/min (ref >60)
Glucose, Bld: 96 mg/dL (ref 65–99)
Potassium: 3.8 mmol/L (ref 3.5–5.1)
Sodium: 136 mmol/L (ref 135–145)

## 2015-12-27 MED ORDER — IOPAMIDOL (ISOVUE-300) INJECTION 61%
INTRAVENOUS | Status: AC
  Filled 2015-12-27: qty 150

## 2015-12-27 MED ORDER — PROCHLORPERAZINE EDISYLATE 5 MG/ML IJ SOLN
5.0000 mg | INTRAMUSCULAR | Status: DC | PRN
  Filled 2015-12-27: qty 2

## 2015-12-27 MED ORDER — HYDRALAZINE HCL 20 MG/ML IJ SOLN: 2.0000 mg | INTRAMUSCULAR | Status: DC | PRN

## 2015-12-27 MED ORDER — AMLODIPINE BESYLATE 5 MG PO TABS
5.0000 mg | ORAL_TABLET | Freq: Every day | ORAL | Status: DC
  Administered 2015-12-27: 5 mg via ORAL
  Filled 2015-12-27: qty 1

## 2015-12-27 MED ORDER — METOPROLOL TARTRATE 12.5 MG HALF TABLET: 12.5000 mg | ORAL_TABLET | Freq: Two times a day (BID) | ORAL | Status: DC

## 2015-12-27 MED ORDER — HYDRALAZINE HCL 20 MG/ML IJ SOLN
10.0000 mg | Freq: Four times a day (QID) | INTRAMUSCULAR | Status: DC | PRN
  Administered 2015-12-27 – 2015-12-29 (×3): 10 mg via INTRAVENOUS
  Filled 2015-12-27 (×3): qty 1

## 2015-12-27 MED ORDER — PANTOPRAZOLE SODIUM 40 MG PO TBEC
40.0000 mg | DELAYED_RELEASE_TABLET | Freq: Every day | ORAL | Status: DC
  Filled 2015-12-27: qty 1

## 2015-12-27 MED ORDER — MORPHINE SULFATE (CONCENTRATE) 10 MG/0.5ML PO SOLN
10.0000 mg | ORAL | Status: DC | PRN
  Filled 2015-12-27: qty 0.5

## 2015-12-27 MED ORDER — IOPAMIDOL (ISOVUE-300) INJECTION 61%
150.0000 mL | Freq: Once | INTRAVENOUS | Status: AC | PRN
  Administered 2015-12-27: 100 mL via ORAL

## 2015-12-27 MED ORDER — SODIUM CHLORIDE 0.9 % IV SOLN: 250.0000 mL | INTRAVENOUS | Status: DC | PRN

## 2015-12-27 MED ORDER — SODIUM CHLORIDE 0.9% FLUSH: 3.0000 mL | INTRAVENOUS | Status: DC | PRN

## 2015-12-27 MED ORDER — LACTATED RINGERS IV BOLUS (SEPSIS): 1000.0000 mL | Freq: Three times a day (TID) | INTRAVENOUS | Status: DC | PRN

## 2015-12-27 MED ORDER — SODIUM CHLORIDE 0.9% FLUSH
3.0000 mL | Freq: Two times a day (BID) | INTRAVENOUS | Status: DC
  Administered 2015-12-27 – 2015-12-28 (×3): 3 mL via INTRAVENOUS

## 2015-12-27 NOTE — Progress Notes (Signed)
Patient ID: William Avila, male   DOB: [birth year], 70 y.o.   MRN: [id number]  [facility] Surgery Progress Note  1 Day Post-Op  Subjective: Feeling well. Some postop pain but well controlled. Tolerating clear liquids. +flatus yesterday. Denies n/v. Plans to walk halls soon.  Objective: Vital signs in last 24 hours: Temp:  [97.7 F (36.5 C)-98.7 F (37.1 C)] 98.7 F (37.1 C) (09/07 0642) Pulse Rate:  [58-83] 58 (09/07 0642) Resp:  [15-19] 16 (09/07 0642) BP: (144-203)/(71-103) 174/96 (09/07 0642) SpO2:  [94 %-98 %] 96 % (09/07 0642) Last BM Date: 12/22/15  Intake/Output from previous day: 09/06 0701 - 09/07 0700 In: 5009.9 [P.O.:240; I.V.:3869.9; IV Piggyback:900] Out: 2140 [Urine:1975; Drains:145; Blood:20] Intake/Output this shift: No intake/output data recorded.  PE: Gen:  Alert, NAD, pleasant Card:  RRR Pulm:  CTAB Abd: Soft, ND, appropriately tender, +BS, incisions covered with clean/dry dressings, RLQ JP drain dressing covered with bright red blood, minimal serosanguinous drainage output  RLQ JP drain 145cc output   Lab Results:   Recent Labs  12/26/15 0439 12/27/15 0458  WBC 6.9 10.6*  HGB 13.3 13.6  HCT 39.4 39.9  PLT 214 229   BMET  Recent Labs  12/26/15 0439 12/27/15 0458  NA 136 136  K 3.4* 3.8  CL 99* 100*  CO2 24 27  GLUCOSE 74 96  BUN 11 8  CREATININE 0.96 0.90  CALCIUM 8.2* 8.6*   PT/INR No results for input(s): LABPROT, INR in the last 72 hours. CMP     Component Value Date/Time   NA 136 12/27/2015 0458   K 3.8 12/27/2015 0458   CL 100 (L) 12/27/2015 0458   CO2 27 12/27/2015 0458   GLUCOSE 96 12/27/2015 0458   BUN 8 12/27/2015 0458   CREATININE 0.90 12/27/2015 0458   CALCIUM 8.6 (L) 12/27/2015 0458   PROT 5.8 (L) 12/24/2015 0234   ALBUMIN 3.6 12/24/2015 0234   AST 19 12/24/2015 0234   ALT 14 (L) 12/24/2015 0234   ALKPHOS 79 12/24/2015 0234   BILITOT 0.6 12/24/2015 0234   GFRNONAA >60 12/27/2015 0458   GFRAA >60  12/27/2015 0458   Lipase     Component Value Date/Time   LIPASE 55 (H) 12/23/2015 0851       Studies/Results: No results found.  Anti-infectives: Anti-infectives    Start     Dose/Rate Route Frequency Ordered Stop   12/26/15 1900  ceFAZolin (ANCEF) IVPB 2g/100 mL premix     2 g 200 mL/hr over 30 Minutes Intravenous Every 8 hours 12/26/15 1642 12/27/15 0548   12/26/15 1700  metroNIDAZOLE (FLAGYL) IVPB 500 mg     500 mg 100 mL/hr over 60 Minutes Intravenous Every 6 hours 12/26/15 1642 12/27/15 0618   12/26/15 1200  metroNIDAZOLE (FLAGYL) IVPB 500 mg     500 mg 100 mL/hr over 60 Minutes Intravenous To Surgery 12/26/15 1149 12/26/15 1147   12/26/15 0800  ceFAZolin (ANCEF) IVPB 2g/100 mL premix     2 g 200 mL/hr over 30 Minutes Intravenous To [facility] 12/25/15 1421 12/26/15 1124       Assessment/Plan S/p procedures Dr. Johney Maine 12/26/15: 1. Laparoscopic reduction of paraesophageal hiatal hernia 2. Type II mediastinal dissection. 3. Primary repair of hiatal hernia over pledgets. 3.5 Mesh reinforcement with absorbable 4. Anterior & posterior gastropexy. 5. Nissen fundoplication 2 cm over a 56-French bougie - POD 1 - no n/v, pain well controlled - clear liquid diet - encouraged ambulation  LA Grade D esophagitis -  continue BID PPD Calcified right adrenal mass.Chronic. Followed by Dr. [name]. Leukocytosis - 10.6, continue to monitor HTN - hydralazine PRN  VTE - lovenox, SCD's FEN - clear liquids. Likely home tomorrow.  Plan - swallow   LOS: 2 days    Jerrye Beavers , [facility] 12/27/2015, 7:21 AM Pager: [phone] Consults: [phone] Mon-Fri 7:00 am-4:30 pm Sat-Sun 7:00 am-11:30 am

## 2015-12-27 NOTE — Progress Notes (Signed)
[facility]                                                                              Patient Demographics  Matias Thurman, is a 70 y.o. male, DOB - Jun 17, 1945, HWE:[id number]  Admit date - 12/23/2015   Admitting Physician Courage Denton Brick, MD  Outpatient Primary MD for the patient is Irven Shelling, MD  Outpatient specialists:   LOS - 2  days    Chief Complaint  Patient presents with  . Abdominal Pain       Brief summary   William Avila is a 70 y.o. male with medical history significant for remote appendectomy, known large calcified right upper quadrant retroperitoneal mass present since 2015. Patient reported mild postprandial epigastric discomfort with bloating for about 6 months. He managed this by increasing meal frequency and decreasing meal size. Over the past week has had increasing epigastric discomfort with significant colicky abdominal pain immediate postprandial and as of the past 2 days has had pain not only associated with eating but pain not definitively related to eating food. Patient had CT abdomen and pelvis on 9/2 which showed very large hiatal hernia. Calcified mass or collection measuring 10 cm in the right upper quadrant of the retroperitoneum Patient was admitted for further workup  Assessment & Plan     Large Hiatal hernia with associated colicky Epigastric pain -Patient reported abdominal pain and emesis, inability to keep down even liquid foods or medications. Large hiatal hernia without evidence of volvulus formation documented on CT of the abdomen and pelvis performed 9/2 - GI was consulted. Patient underwent EGD on 9/4 which showed large hiatal hernia, large paraesophageal hernia. Recommended surgical consultation for hernia repair.  - Patient underwent hernia repair On 9/6. - Patient is on clear liquids per general surgery, which will be continued. Continue IV fluids. Patient's pain in the left upper abdomen and lower  chest or due to his surgery. Pain control.  Elevated blood pressure without known history of hypertension Elevated BP could be due to pain issues. However, blood pressures are quite significantly elevated. Initiate amlodipine and hydralazine as needed. Continue to monitor closely.  Dehydration -Improving. Continue IV fluid hydration. Cut back on the rate.  Leukocytosis - Likely reflective of dehydration. Lactic acid normal. Now normal.  Protein calorie malnutrition, hypophosphatemia - Repleted phosphorous, now level close to normal  Retroperitoneal mass-large calcified mass, present since 2015 -Has been previously evaluated by Dr. Donne Hazel in the outpatient setting (see ER documentation from 9/2 regarding details)   Code Status: full  DVT Prophylaxis: Lovenox Family Communication: Patient seen in PACU. He was groggy. Disposition Plan: Patient on clear liquids now. Okay to mobilize. Possible discharge tomorrow depending on patient's progress.  Time Spent in minutes   25 minutes  Procedures:  EGD  Hiatal hernia repair on 9/6  Consultants:   GI General surgery   Antimicrobials:      Medications  Scheduled Meds: . enoxaparin (LOVENOX) injection  40 mg Subcutaneous Q24H  . famotidine (PEPCID) IV  20 mg Intravenous Q12H  . feeding supplement  1 Container Oral TID BM  . folic acid  1  mg Intravenous Daily  . iopamidol      . lip balm   Topical BID  . metoprolol tartrate  12.5 mg Oral BID  . pantoprazole  40 mg Intravenous Q12H  . sodium chloride flush  3 mL Intravenous Q12H  . sucralfate  1 g Oral TID WC & HS  . thiamine  100 mg Intravenous Daily   Continuous Infusions: . sodium chloride 75 mL/hr at 12/27/15 1111   PRN Meds:.sodium chloride, acetaminophen, acetaminophen, alum & mag hydroxide-simeth, bisacodyl, chlorproMAZINE (THORAZINE) IV, diphenhydrAMINE, fentaNYL (SUBLIMAZE) injection, guaiFENesin-dextromethorphan, hydrALAZINE, HYDROmorphone (DILAUDID) injection,  lactated ringers, magic mouthwash, menthol-cetylpyridinium, metoCLOPramide (REGLAN) injection, morphine CONCENTRATE, ondansetron (ZOFRAN) IV **OR** ondansetron (ZOFRAN) IV, ondansetron (ZOFRAN) IV, phenol, polyethylene glycol, prochlorperazine, sodium chloride flush   Antibiotics   Anti-infectives    Start     Dose/Rate Route Frequency Ordered Stop   12/26/15 1900  ceFAZolin (ANCEF) IVPB 2g/100 mL premix     2 g 200 mL/hr over 30 Minutes Intravenous Every 8 hours 12/26/15 1642 12/27/15 0548   12/26/15 1700  metroNIDAZOLE (FLAGYL) IVPB 500 mg     500 mg 100 mL/hr over 60 Minutes Intravenous Every 6 hours 12/26/15 1642 12/27/15 0618   12/26/15 1200  metroNIDAZOLE (FLAGYL) IVPB 500 mg     500 mg 100 mL/hr over 60 Minutes Intravenous To Surgery 12/26/15 1149 12/26/15 1147   12/26/15 0800  ceFAZolin (ANCEF) IVPB 2g/100 mL premix     2 g 200 mL/hr over 30 Minutes Intravenous To ShortStay Surgical 12/25/15 1421 12/26/15 1124        Subjective:   Patient feels well this morning. Complains of pain in the left upper abdomen and lower chest, especially with movement. Denies any shortness of breath. No nausea or vomiting. Tolerating his clear liquids.  Objective:   Vitals:   12/26/15 1733 12/26/15 2046 12/27/15 0220 12/27/15 0642  BP: (!) 168/92 (!) 166/96 (!) 162/89 (!) 174/96  Pulse: 74 61 60 (!) 58  Resp:  '18 17 16  '$ Temp: 97.7 F (36.5 C) 97.8 F (36.6 C) 98.7 F (37.1 C) 98.7 F (37.1 C)  TempSrc: Oral Oral Oral Oral  SpO2: 94% 95% 95% 96%  Weight:      Height:        Intake/Output Summary (Last 24 hours) at 12/27/15 1150 Last data filed at 12/27/15 0936  Gross per 24 hour  Intake          4817.42 ml  Output             2140 ml  Net          2677.42 ml     Wt Readings from Last 3 Encounters:  12/23/15 77.1 kg (170 lb)  12/22/15 77.1 kg (170 lb)  02/11/15 81.6 kg (180 lb)     Exam  General: Awake and alert. In no distress.  Cardiovascular: S1 S2 normal  regular.   Respiratory: CTA bilaterally  Gastrointestinal: Soft, NT nondistended, sluggish bowel sounds. Drain noted.  Ext: no cyanosis clubbing or edema  Neuro: no focal deficits   Data Reviewed:  I have personally reviewed following labs and imaging studies  Micro Results Recent Results (from the past 240 hour(s))  Surgical pcr screen     Status: None   Collection Time: 12/25/15 11:29 PM  Result Value Ref Range Status   MRSA, PCR NEGATIVE NEGATIVE Final   Staphylococcus aureus NEGATIVE NEGATIVE Final    Comment:        The Xpert SA  Assay (FDA approved for NASAL specimens in patients over 60 years of age), is one component of a comprehensive surveillance program.  Test performance has been validated by [facility] for patients greater than or equal to 43 year old. It is not intended to diagnose infection nor to guide or monitor treatment.     Radiology Reports Dg Chest 2 View  Result Date: 12/22/2015 CLINICAL DATA:  Chest and upper abdomen pain starting yesterday EXAM: CHEST  2 VIEW COMPARISON:  May 08, 2007 FINDINGS: The heart size and mediastinal contours are within normal limits. There is no focal infiltrate, pulmonary edema, or pleural effusion. The visualized skeletal structures are stable. There is a large hiatal hernia. IMPRESSION: No active cardiopulmonary disease. Electronically Signed   By: Abelardo Diesel M.D.   On: 12/22/2015 11:05   Ct Abdomen Pelvis W Contrast  Result Date: 12/22/2015 CLINICAL DATA:  Epigastric pain when trying to eat or drink. Symptoms began yesterday when he ate lunch. EXAM: CT ABDOMEN AND PELVIS WITH CONTRAST TECHNIQUE: Multidetector CT imaging of the abdomen and pelvis was performed using the standard protocol following bolus administration of intravenous contrast. CONTRAST:  100 cc Isovue-300 COMPARISON:  Lumbar spine films 08/30/2010, chest x-ray 12/22/2015 FINDINGS: Lower chest: There is small amount of scarring at both lung bases.  Heart size is normal. Minimal atherosclerosis at the level of the aortic valve. There is a large hiatal hernia. Hepatobiliary: Posterior to liver, superior to the right kidney, and lateral to the adrenal gland, there is a hyperdense calcified mass or collection contrast which measures 8.8 x 10.0 x 6.9 cm. Correlation with history is recommended. Has the patient had interventional procedures performed in the right upper quadrant? Pancreas: Normal in appearance. Spleen: Normal in appearance. Renal/Adrenal: The right adrenal gland appears to be normal in appearance but is in close proximity to the collection or calcified mass described above. The left adrenal gland is normal in appearance. The kidneys are normal in appearance. There is normal excretion and enhancement bilaterally. Gastrointestinal tract: Large hiatal hernia is present. Small bowel loops are normal in appearance. Numerous colonic diverticular are present. Status post appendectomy. Reproductive/Pelvis: Urinary bladder is normal in appearance. The prostate is enlarged. Seminal vesicles are symmetric. No free pelvic fluid. Vascular/Lymphatic: There is atherosclerotic calcification of the abdominal aorta. The lower thoracic aorta is tortuous. No retroperitoneal or mesenteric adenopathy. Musculoskeletal/Abdominal wall: Abdominal wall is unremarkable. Status post anterior fusion of the lumbar spine at L4-5 and L5-S1. Superior endplate fracture at L2 and wedge compression fracture of L1 or chronic and stable in appearance. Vacuum disc phenomenon at L3-4. Other: none IMPRESSION: 1. Calcified mass or collection of contrast measuring 10 cm in the right upper quadrant retroperitoneum. Recommend correlation with history guarding any prior interventional procedures, contrast injection. The mass has a benign appearance and is unlikely causing the patient's symptoms. 2. Large hiatal hernia. 3.  Aortic atherosclerosis. 4. Enlarged prostate. 5. Prior spinal surgery and  spondylosis. Electronically Signed   By: Nolon Nations M.D.   On: 12/22/2015 15:06   Dg Chest Port 1 View  Result Date: 12/23/2015 CLINICAL DATA:  Mid chest pain. EXAM: PORTABLE CHEST 1 VIEW COMPARISON:  December 22, 2015 FINDINGS: The heart size and mediastinal contours are within normal limits. There is no focal infiltrate, pulmonary edema, or pleural effusion. Hiatal hernia is identified. Overlying soft tissue is projected over the upper chest bilaterally. The visualized skeletal structures are stable. IMPRESSION: No active cardiopulmonary disease. Electronically Signed   By: Seward Meth  Augustin Coupe M.D.   On: 12/23/2015 09:08   Dg Esophagus W/water Sol Cm  Result Date: 12/27/2015 CLINICAL DATA:  Postop hiatal hernia repair yesterday. Evaluate for esophageal leak. EXAM: ESOPHOGRAM/BARIUM SWALLOW TECHNIQUE: Single contrast examination was performed using water-soluble contrast (Isovue-300). FLUOROSCOPY TIME:  Fluoroscopy Time:  2 minutes and 14 seconds Radiation Exposure Index (if provided by the fluoroscopic device): Number of Acquired Spot Images: 0 COMPARISON:  Abdominal pelvic CT 12/22/2015. FINDINGS: The patient swallowed the contrast without difficulty. There is mild presbyesophagus. Contrast empties from the esophagus into the stomach. There is no evidence of esophageal leak. No gastric abnormalities are identified. Mediastinal drain is in place without contrast accumulation. Large right upper quadrant abdominal calcification is again noted. IMPRESSION: 1. No evidence of esophageal leak or significant residual hiatal hernia. 2. Mild presbyesophagus. Electronically Signed   By: Richardean Sale M.D.   On: 12/27/2015 09:52    Lab Data:  CBC:  Recent Labs Lab 12/22/15 1015 12/23/15 5053 12/24/15 0234 12/25/15 0540 12/26/15 0439 12/27/15 0458  WBC 11.2* 13.0* 12.5* 7.2 6.9 10.6*  NEUTROABS 9.8*  --   --   --   --   --   HGB 15.7 15.5 13.8 13.1 13.3 13.6  HCT 44.8 45.6 41.1 39.8 39.4 39.9  MCV  93.5 95.4 96.3 96.6 94.7 94.5  PLT 252 238 220 199 214 976   Basic Metabolic Panel:  Recent Labs Lab 12/23/15 0851 12/23/15 1342 12/24/15 0234 12/25/15 0540 12/26/15 0439 12/27/15 0458  NA 138  --  138 140 136 136  K 3.6  --  3.8 3.4* 3.4* 3.8  CL 103  --  106 107 99* 100*  CO2 24  --  '27 28 24 27  '$ GLUCOSE 144*  --  115* 85 74 96  BUN 14  --  '12 11 11 8  '$ CREATININE 1.11  --  0.88 0.94 0.96 0.90  CALCIUM 9.5  --  8.8* 8.5* 8.2* 8.6*  MG  --  1.8  --   --   --   --   PHOS  --  1.6* 3.2  --   --   --    GFR: Estimated Creatinine Clearance: 83.3 mL/min (by C-G formula based on SCr of 0.9 mg/dL). Liver Function Tests:  Recent Labs Lab 12/22/15 1015 12/23/15 0851 12/24/15 0234  AST '21 26 19  '$ ALT 17 16* 14*  ALKPHOS 95 96 79  BILITOT 0.8 1.1 0.6  PROT 6.8 6.6 5.8*  ALBUMIN 4.2 4.1 3.6    Recent Labs Lab 12/22/15 1015 12/23/15 0851  LIPASE 76* 55*    Breasia Karges M.D. [facility] 12/27/2015, 11:50 AM  Pager: [phone]  Between 7am to 7pm - call Pager - [phone]  After 7pm go to [email or web address] - password [email or web address]  Call night coverage person covering after 7pm

## 2015-12-27 NOTE — Plan of Care (Signed)
Problem: Food- and Nutrition-Related Knowledge Deficit (NB-1.1) Goal: Nutrition education Formal process to instruct or train a patient/client in a skill or to impart knowledge to help patients/clients voluntarily manage or modify food choices and eating behavior to maintain or improve health. Outcome: Adequate for Discharge  RD consulted for nutrition education regarding esophageal surgery (nissen fundoplication).   Spoke with RN, who confirmed pt is currently on clear liquids at this time.   Spoke with pt and pt wife at bedside. Pt reports he is able to tolerate clear liquids well, as as long as he consumes small bites and sips. Pt expresses that he is hesitant to advance diet at this time, due to fear of unable to tolerate foods. Per pt wife, pt was told to follow a liquid diet for 2-3 weeks post-op. He was consuming 2 chocolate Boost supplement BID PTA. Pt is currently consuming Boost Breeze supplements.   RD provided "Dypshagia 1: Pureed Consistency" and "Esophageal Surgery" handouts from the [employer]. Discussed rationale for liquid/pureed diet for 2-3 weeks post-op. Encouraged pt to slowly advance diet as tolerated (especially to full liquids), particularly due to opportunities to allow for more food choices and nutritional adequacy for advancement. Discussed ways pt could modify foods and liquids to optimize protein and calorie intake. Education was focused on oral nutrition supplements available to pt in a retail setting, blenderizing favorite foods to pureed consistency, and food options within diet restrictions to maximize calorie and protein intake. Pt and wife also informed that they could purchase Boost Breeze supplements through [facility], online, or via pharmacy special order. RD contact information provided and encouraged to contact RD for additional questions or concerns. Teach back method used.  Expect fair compliance.  Body mass index  is 23.06 kg/m. Pt meets criteria for normal weight range based on current BMI.  Current diet order is clear liquid, patient is consuming approximately n/a% of meals at this time. Labs and medications reviewed. No further nutrition interventions warranted at this time. RD contact information provided. If additional nutrition issues arise, please re-consult RD.  Anaiyah Anglemyer A. Jimmye Norman, RD, LDN, CDE Pager: [phone] After hours Pager: [phone]

## 2015-12-27 NOTE — Discharge Instructions (Addendum)
Your first appointment is for a drain check on 01/07/16 at 10am. Please arrive at least 30 min before your appointment to complete your check in paperwork.  If you are unable to arrive 30 min prior to your appointment time we may have to cancel or reschedule you.  Your second appointment is at 01/15/16 at 9:15am.    EATING AFTER YOUR ESOPHAGEAL SURGERY (Stomach Fundoplication, Hiatal Hernia repair, Achalasia surgery, etc)  ######################################################################  EAT Start with a pureed / full liquid diet (see below) Gradually transition to a high fiber diet with a fiber supplement over the next month after discharge.    WALK Walk an hour a day.  Control your pain to do that.    CONTROL PAIN Control pain so that you can walk, sleep, tolerate sneezing/coughing, go up/down stairs.  HAVE A BOWEL MOVEMENT DAILY Keep your bowels regular to avoid problems.  OK to try a laxative to override constipation.  OK to use an antidairrheal to slow down diarrhea.  Call if not better after 2 tries  CALL IF YOU HAVE PROBLEMS/CONCERNS Call if you are still struggling despite following these instructions. Call if you have concerns not answered by these instructions  ######################################################################   After your esophageal surgery, expect some sticking with swallowing over the next 1-2 months.    If food sticks when you eat, it is called "dysphagia".  This is due to swelling around your esophagus at the wrap & hiatal diaphragm repair.  It will gradually ease off over the next few months.  To help you through this temporary phase, we start you out on a pureed (blenderized) diet.  Your first meal in the hospital was thin liquids.  You should have been given a pureed diet by the time you left the hospital.  We ask patients to stay on a pureed diet for the first 2-3 weeks to avoid anything getting "stuck" near your recent surgery.  Don't be  alarmed if your ability to swallow doesn't progress according to this plan.  Everyone is different and some diets can advance more or less quickly.     Some BASIC RULES to follow are:  Maintain an upright position whenever eating or drinking.  Take small bites - just a teaspoon size bite at a time.  Eat slowly.  It may also help to eat only one food at a time.  Consider nibbling through smaller, more frequent meals & avoid the urge to eat BIG meals  Do not push through feelings of fullness, nausea, or bloatedness  Do not mix solid foods and liquids in the same mouthful  Try not to "wash foods down" with large gulps of liquids.  Avoid carbonated (bubbly/fizzy) drinks.    Avoid foods that make you feel gassy or bloated.  Start with bland foods first.  Wait on trying greasy, fried, or spicy meals until you are tolerating more bland solids well.  Understand that it will be hard to burp and belch at first.  This gradually improves with time.  Expect to be more gassy/flatulent/bloated initially.  Walking will help your body manage it better.  Consider using medications for bloating that contain simethicone such as  Maalox or Gas-X   Eat in a relaxed atmosphere & minimize distractions.  Avoid talking while eating.    Do not use straws.  Following each meal, sit in an upright position (90 degree angle) for 60 to 90 minutes.  Going for a short walk can help as well  If food  does stick, don't panic.  Try to relax and let the food pass on its own.  Sipping WARM LIQUID such as strong hot black tea can also help slide it down.   Be gradual in changes & use common sense:  -If you easily tolerating a certain "level" of foods, advance to the next level gradually -If you are having trouble swallowing a particular food, then avoid it.   -If food is sticking when you advance your diet, go back to thinner previous diet (the lower LEVEL) for 1-2 days.  LEVEL 1 = PUREED DIET  Do for the first  2 WEEKS AFTER SURGERY  -Foods in this group are pureed or blenderized to a smooth, mashed potato-like consistency.  -If necessary, the pureed foods can keep their shape with the addition of a thickening agent.   -Meat should be pureed to a smooth, pasty consistency.  Hot broth or gravy may be added to the pureed meat, approximately 1 oz. of liquid per 3 oz. serving of meat. -CAUTION:  If any foods do not puree into a smooth consistency, swallowing will be more difficult.  (For example, nuts or seeds sometimes do not blend well.)  Hot Foods Cold Foods  Pureed scrambled eggs and cheese Pureed cottage cheese  Baby cereals Thickened juices and nectars  Thinned cooked cereals (no lumps) Thickened milk or eggnog  Pureed [place] toast or pancakes Ensure  Mashed potatoes Ice cream  Pureed parsley, au gratin, scalloped potatoes, candied sweet potatoes Fruit or [place] ice, sherbet  Pureed buttered or alfredo noodles Plain yogurt  Pureed vegetables (no corn or peas) Instant breakfast  Pureed soups and creamed soups Smooth pudding, mousse, custard  Pureed scalloped apples Whipped gelatin  Gravies Sugar, syrup, honey, jelly  Sauces, cheese, tomato, barbecue, white, creamed Cream  Any baby food Creamer  Alcohol in moderation (not beer or champagne) Margarine  Coffee or tea Mayonnaise   Ketchup, mustard   Apple sauce   SAMPLE MENU:  PUREED DIET Breakfast Lunch Dinner   Orange juice, 1/2 cup  Cream of wheat, 1/2 cup  Pineapple juice, 1/2 cup  Pureed [place], barley soup, 3/4 cup  Pureed Hawaiian chicken, 3 oz   Scrambled eggs, mashed or blended with cheese, 1/2 cup  Tea or coffee, 1 cup   Whole milk, 1 cup   Non-dairy creamer, 2 Tbsp.  Mashed potatoes, 1/2 cup  Pureed cooled broccoli, 1/2 cup  Apple sauce, 1/2 cup  Coffee or tea  Mashed potatoes, 1/2 cup  Pureed spinach, 1/2 cup  Frozen yogurt, 1/2 cup  Tea or coffee      LEVEL 2 = SOFT DIET  After your first 2 weeks,  you can advance to a soft diet.   Keep on this diet until everything goes down easily.  Hot Foods Cold Foods  White fish Cottage cheese  Stuffed fish Junior baby fruit  Baby food meals Semi thickened juices  Minced soft cooked, scrambled, poached eggs nectars  Souffle & omelets Ripe mashed bananas  Cooked cereals Canned fruit, pineapple sauce, milk  potatoes Milkshake  Buttered or Alfredo noodles Custard  Cooked cooled vegetable Puddings, including tapioca  Sherbet Yogurt  Vegetable soup or alphabet soup Fruit ice, [place] ice  Gravies Whipped gelatin  Sugar, syrup, honey, jelly Junior baby desserts  Sauces:  Cheese, creamed, barbecue, tomato, white Cream  Coffee or tea Margarine   SAMPLE MENU:  LEVEL 2 Breakfast Lunch Dinner   Orange juice, 1/2 cup  Oatmeal, 1/2 cup  Scrambled eggs with cheese, 1/2 cup  Decaffeinated tea, 1 cup  Whole milk, 1 cup  Non-dairy creamer, 2 Tbsp  Pineapple juice, 1/2 cup  Minced beef, 3 oz  Gravy, 2 Tbsp  Mashed potatoes, 1/2 cup  Minced fresh broccoli, 1/2 cup  Applesauce, 1/2 cup  Coffee, 1 cup  [place], barley soup, 3/4 cup  Minced Hawaiian chicken, 3 oz  Mashed potatoes, 1/2 cup  Cooked spinach, 1/2 cup  Frozen yogurt, 1/2 cup  Non-dairy creamer, 2 Tbsp      LEVEL 3 = CHOPPED DIET  -After all the foods in level 2 (soft diet) are passing through well you should advance up to more chopped foods.  -It is still important to cut these foods into small pieces and eat slowly.  Hot Foods Cold Foods  Poultry Cottage cheese  Chopped Swedish meatballs Yogurt  Meat salads (ground or flaked meat) Milk  Flaked fish (tuna) Milkshakes  Poached or scrambled eggs Soft, cold, dry cereal  Souffles and omelets Fruit juices or nectars  Cooked cereals Chopped canned fruit  Chopped [place] toast or pancakes Canned fruit cocktail  Noodles or pasta (no rice) Pudding, mousse, custard  Cooked vegetables (no frozen peas, corn, or mixed  vegetables) Green salad  Canned small sweet peas Ice cream  Creamed soup or vegetable soup Fruit ice, [place] ice  Pureed vegetable soup or alphabet soup Non-dairy creamer  Ground scalloped apples Margarine  Gravies Mayonnaise  Sauces:  Cheese, creamed, barbecue, tomato, white Ketchup  Coffee or tea Mustard   SAMPLE MENU:  LEVEL 3 Breakfast Lunch Dinner   Orange juice, 1/2 cup  Oatmeal, 1/2 cup  Scrambled eggs with cheese, 1/2 cup  Decaffeinated tea, 1 cup  Whole milk, 1 cup  Non-dairy creamer, 2 Tbsp  Ketchup, 1 Tbsp  Margarine, 1 tsp  Salt, 1/4 tsp  Sugar, 2 tsp  Pineapple juice, 1/2 cup  Ground beef, 3 oz  Gravy, 2 Tbsp  Mashed potatoes, 1/2 cup  Cooked spinach, 1/2 cup  Applesauce, 1/2 cup  Decaffeinated coffee  Whole milk  Non-dairy creamer, 2 Tbsp  Margarine, 1 tsp  Salt, 1/4 tsp  Pureed [place], barley soup, 3/4 cup  Barbecue chicken, 3 oz  Mashed potatoes, 1/2 cup  Ground fresh broccoli, 1/2 cup  Frozen yogurt, 1/2 cup  Decaffeinated tea, 1 cup  Non-dairy creamer, 2 Tbsp  Margarine, 1 tsp  Salt, 1/4 tsp  Sugar, 1 tsp    LEVEL 4:  REGULAR FOODS  -Foods in this group are soft, moist, regularly textured foods.   -This level includes meat and breads, which tend to be the hardest things to swallow.   -Eat very slowly, chew well and continue to avoid carbonated drinks. -most people are at this level in 4-6 weeks  Hot Foods Cold Foods  Baked fish or skinned Soft cheeses - cottage cheese  Souffles and omelets Cream cheese  Eggs Yogurt  Stuffed shells Milk  Spaghetti with meat sauce Milkshakes  Cooked cereal Cold dry cereals (no nuts, dried fruit, coconut)  [place] toast or pancakes Crackers  Buttered toast Fruit juices or nectars  Noodles or pasta (no rice) Canned fruit  Potatoes (all types) Ripe bananas  Soft, cooked vegetables (no corn, lima, or baked beans) Peeled, ripe, fresh fruit  Creamed soups or vegetable soup Cakes (no  nuts, dried fruit, coconut)  Canned chicken noodle soup Plain doughnuts  Gravies Ice cream  Bacon dressing Pudding, mousse, custard  Sauces:  Cheese, creamed, barbecue, tomato, white Fruit  ice, [place] ice, sherbet  Decaffeinated tea or coffee Whipped gelatin  Pork chops Regular gelatin   Canned fruited gelatin molds   Sugar, syrup, honey, jam, jelly   Cream   Non-dairy   Margarine   Oil   Mayonnaise   Ketchup   Mustard   TROUBLESHOOTING IRREGULAR BOWELS  1) Avoid extremes of bowel movements (no bad constipation/diarrhea)  2) Miralax 17gm mixed in 8oz. water or juice-daily. May use BID as needed.  3) Gas-x,Phazyme, etc. as needed for gas & bloating.  4) Soft,bland diet. No spicy,greasy,fried foods.  5) Prilosec over-the-counter as needed  6) May hold gluten/wheat products from diet to see if symptoms improve.  7) May try probiotics (Align, Activa, etc) to help calm the bowels down  7) If symptoms become worse call back immediately.    If you have any questions please call our office at [place]: [phone].  LAPAROSCOPIC SURGERY: POST OP INSTRUCTIONS  ######################################################################  EAT Gradually transition to a high fiber diet with a fiber supplement over the next few weeks after discharge.  Start with a pureed / full liquid diet (see below)  WALK Walk an hour a day.  Control your pain to do that.    CONTROL PAIN Control pain so that you can walk, sleep, tolerate sneezing/coughing, go up/down stairs.  HAVE A BOWEL MOVEMENT DAILY Keep your bowels regular to avoid problems.  OK to try a laxative to override constipation.  OK to use an antidairrheal to slow down diarrhea.  Call if not better after 2 tries  CALL IF YOU HAVE PROBLEMS/CONCERNS Call if you are still struggling despite following these instructions. Call if you have concerns not answered by these  instructions  ######################################################################    1. DIET: Follow a light bland diet the first 24 hours after arrival home, such as soup, liquids, crackers, etc.  Be sure to include lots of fluids daily.  Avoid fast food or heavy meals as your are more likely to get nauseated.  Eat a low fat the next few days after surgery.   2. Take your usually prescribed home medications unless otherwise directed. 3. PAIN CONTROL: a. Pain is best controlled by a usual combination of three different methods TOGETHER: i. Ice/Heat ii. Over the counter pain medication iii. Prescription pain medication b. Most patients will experience some swelling and bruising around the incisions.  Ice packs or heating pads (30-60 minutes up to 6 times a day) will help. Use ice for the first few days to help decrease swelling and bruising, then switch to heat to help relax tight/sore spots and speed recovery.  Some people prefer to use ice alone, heat alone, alternating between ice & heat.  Experiment to what works for you.  Swelling and bruising can take several weeks to resolve.   c. It is helpful to take an over-the-counter pain medication regularly for the first few weeks.  Choose one of the following that works best for you: i. Naproxen (Aleve, etc)  Two '220mg'$  tabs twice a day ii. Ibuprofen (Advil, etc) Three '200mg'$  tabs four times a day (every meal & bedtime) iii. Acetaminophen (Tylenol, etc) 500-'650mg'$  four times a day (every meal & bedtime) d. A  prescription for pain medication (such as oxycodone, hydrocodone, etc) should be given to you upon discharge.  Take your pain medication as prescribed.  i. If you are having problems/concerns with the prescription medicine (does not control pain, nausea, vomiting, rash, itching, etc), please call us [phone] to  see if we need to switch you to a different pain medicine that will work better for you and/or control your side effect  better. ii. If you need a refill on your pain medication, please contact your pharmacy.  They will contact our office to request authorization. Prescriptions will not be filled after 5 pm or on week-ends. 4. Avoid getting constipated.  Between the surgery and the pain medications, it is common to experience some constipation.  Increasing fluid intake and taking a fiber supplement (such as Metamucil, Citrucel, FiberCon, MiraLax, etc) 1-2 times a day regularly will usually help prevent this problem from occurring.  A mild laxative (prune juice, Milk of Magnesia, MiraLax, etc) should be taken according to package directions if there are no bowel movements after 48 hours.   5. Watch out for diarrhea.  If you have many loose bowel movements, simplify your diet to bland foods & liquids for a few days.  Stop any stool softeners and decrease your fiber supplement.  Switching to mild anti-diarrheal medications (Kayopectate, Pepto Bismol) can help.  If this worsens or does not improve, please call us. 6. Wash / shower every day.  You may shower over the dressings as they are waterproof.  Continue to shower over incision(s) after the dressing is off. 7. Remove your waterproof bandages 5 days after surgery.  You may leave the incision open to air.  You may replace a dressing/Band-Aid to cover the incision for comfort if you wish.  8. ACTIVITIES as tolerated:   a. You may resume regular (light) daily activities beginning the next day--such as daily self-care, walking, climbing stairs--gradually increasing activities as tolerated.  If you can walk 30 minutes without difficulty, it is safe to try more intense activity such as jogging, treadmill, bicycling, low-impact aerobics, swimming, etc. b. Save the most intensive and strenuous activity for last such as sit-ups, heavy lifting, contact sports, etc  Refrain from any heavy lifting or straining until you are off narcotics for pain control.   c. DO NOT PUSH THROUGH PAIN.   Let pain be your guide: If it hurts to do something, don't do it.  Pain is your body warning you to avoid that activity for another week until the pain goes down. d. You may drive when you are no longer taking prescription pain medication, you can comfortably wear a seatbelt, and you can safely maneuver your car and apply brakes. e. Dennis Bast may have sexual intercourse when it is comfortable.  9. FOLLOW UP in our office a. Please call [facility] at [phone] to set up an appointment to see your surgeon in the office for a follow-up appointment approximately 2-3 weeks after your surgery. b. Make sure that you call for this appointment the day you arrive home to insure a convenient appointment time. 10. IF YOU HAVE DISABILITY OR FAMILY LEAVE FORMS, BRING THEM TO THE OFFICE FOR PROCESSING.  DO NOT GIVE THEM TO YOUR DOCTOR.   WHEN TO CALL us [phone]: 1. Poor pain control 2. Reactions / problems with new medications (rash/itching, nausea, etc)  3. Fever over 101.5 F (38.5 C) 4. Inability to urinate 5. Nausea and/or vomiting 6. Worsening swelling or bruising 7. Continued bleeding from incision. 8. Increased pain, redness, or drainage from the incision   The clinic staff is available to answer your questions during regular business hours (8:30am-5pm).  Please dont hesitate to call and ask to speak to one of our nurses for clinical concerns.   If you  have a medical emergency, go to the nearest emergency room or call 911.  A surgeon from [facility] is always on call at the [facility], [place], [place], [place], [place]  [address] ? MAIN: [phone] ? TOLL FREE: [phone] ?  FAX [phone] [email or web address]

## 2015-12-28 DIAGNOSIS — G8918 Other acute postprocedural pain: Secondary | ICD-10-CM

## 2015-12-28 LAB — BASIC METABOLIC PANEL
Anion gap: 9 (ref 5–15)
BUN: 6 mg/dL (ref 6–20)
CO2: 26 mmol/L (ref 22–32)
Calcium: 8.3 mg/dL — ABNORMAL LOW (ref 8.9–10.3)
Chloride: 100 mmol/L — ABNORMAL LOW (ref 101–111)
Creatinine, Ser: 0.81 mg/dL (ref 0.61–1.24)
GFR calc Af Amer: >60 mL/min (ref >60)
GFR calc non Af Amer: >60 mL/min (ref >60)
Glucose, Bld: 117 mg/dL — ABNORMAL HIGH (ref 65–99)
Potassium: 3.2 mmol/L — ABNORMAL LOW (ref 3.5–5.1)
Sodium: 135 mmol/L (ref 135–145)

## 2015-12-28 LAB — CBC
HCT: 39.4 % (ref 39.0–52.0)
Hemoglobin: 13.5 g/dL (ref 13.0–17.0)
MCH: 32.1 pg (ref 26.0–34.0)
MCHC: 34.3 g/dL (ref 30.0–36.0)
MCV: 93.6 fL (ref 78.0–100.0)
Platelets: 212 10*3/uL (ref 150–400)
RBC: 4.21 MIL/uL — ABNORMAL LOW (ref 4.22–5.81)
RDW: 12.4 % (ref 11.5–15.5)
WBC: 8.7 10*3/uL (ref 4.0–10.5)

## 2015-12-28 MED ORDER — DOCUSATE SODIUM 50 MG/5ML PO LIQD: 100.0000 mg | Freq: Two times a day (BID) | ORAL | 0 refills | Status: DC

## 2015-12-28 MED ORDER — POLYETHYLENE GLYCOL 3350 17 G PO PACK: 17.0000 g | PACK | Freq: Two times a day (BID) | ORAL | 0 refills | Status: DC | PRN

## 2015-12-28 MED ORDER — OMEPRAZOLE 2 MG/ML ORAL SUSPENSION: 20.0000 mg | Freq: Two times a day (BID) | ORAL | 1 refills | Status: DC

## 2015-12-28 MED ORDER — HYDROMORPHONE HCL 2 MG PO TABS: 1.0000 mg | ORAL_TABLET | ORAL | Status: DC | PRN

## 2015-12-28 MED ORDER — HYDRALAZINE HCL 25 MG PO TABS: 25.0000 mg | ORAL_TABLET | Freq: Three times a day (TID) | ORAL | 0 refills | Status: DC

## 2015-12-28 MED ORDER — HYDROMORPHONE HCL 2 MG PO TABS
2.0000 mg | ORAL_TABLET | ORAL | Status: DC | PRN
Start: 2015-12-28 — End: 2015-12-29
  Administered 2015-12-28: 2 mg via ORAL
  Filled 2015-12-28: qty 1

## 2015-12-28 MED ORDER — POTASSIUM CHLORIDE 10 MEQ/100ML IV SOLN
10.0000 meq | INTRAVENOUS | Status: AC
  Administered 2015-12-28 (×4): 10 meq via INTRAVENOUS
  Filled 2015-12-28 (×3): qty 100

## 2015-12-28 MED ORDER — HYDRALAZINE HCL 25 MG PO TABS
25.0000 mg | ORAL_TABLET | Freq: Three times a day (TID) | ORAL | Status: DC
  Administered 2015-12-28: 25 mg via ORAL
  Filled 2015-12-28 (×2): qty 1

## 2015-12-28 MED ORDER — HYDROMORPHONE HCL 1 MG/ML PO LIQD: 1.0000 mg | ORAL | 0 refills | Status: DC | PRN

## 2015-12-28 NOTE — Progress Notes (Signed)
[facility]                                                                              Patient Demographics  William Avila, is a 70 y.o. male, DOB - 1945-12-22, XTK:[id number]  Admit date - 12/23/2015   Admitting Physician Courage Denton Brick, MD  Outpatient Primary MD for the patient is Irven Shelling, MD  Outpatient specialists:   LOS - 3  days    Chief Complaint  Patient presents with  . Abdominal Pain       Brief summary   William Avila is a 70 y.o. male with medical history significant for remote appendectomy, known large calcified right upper quadrant retroperitoneal mass present since 2015. Patient reported mild postprandial epigastric discomfort with bloating for about 6 months. He managed this by increasing meal frequency and decreasing meal size. Over the past week has had increasing epigastric discomfort with significant colicky abdominal pain immediate postprandial and as of the past 2 days has had pain not only associated with eating but pain not definitively related to eating food. Patient had CT abdomen and pelvis on 9/2 which showed very large hiatal hernia. Calcified mass or collection measuring 10 cm in the right upper quadrant of the retroperitoneum Patient was admitted for further workup  Assessment & Plan     Large Hiatal hernia with associated colicky Epigastric pain -Patient reported abdominal pain and emesis, inability to keep down even liquid foods or medications. Large hiatal hernia without evidence of volvulus formation documented on CT of the abdomen and pelvis performed 9/2 - GI was consulted. Patient underwent EGD on 9/4 which showed large hiatal hernia, large paraesophageal hernia. Recommended surgical consultation for hernia repair.  - Patient underwent hernia repair On 9/6. - Patient is on clear liquids per general surgery, which will be continued. Continue IV fluids. Patient's pain in the left upper abdomen and lower  chest or due to his surgery. Pain control is the main issue. Still with significant discomfort.  Elevated blood pressure without known history of hypertension Elevated BP could be due to pain issues. However, blood pressures are quite significantly elevated. Initiate hydralazine. Old records reviewed. Blood pressure has been high in the past as well. He is not on any treatment. He likely does have hypertension.  Dehydration -Improved  Leukocytosis - Likely reflective of dehydration. Lactic acid normal. Now normal.  Protein calorie malnutrition, hypophosphatemia - Repleted phosphorous, now level close to normal  Retroperitoneal mass-large calcified mass thought to be adrenal, present since 2015 -Has been previously evaluated by Dr. Donne Hazel in the outpatient setting (see ER documentation from 9/2 regarding details)   Code Status: full  DVT Prophylaxis: Lovenox Family Communication: Discussed with patient and his wife Disposition Plan: Possible discharge tomorrow depending on patient's progress and pain issues. Discussed with general surgery today.  Time Spent in minutes   25 minutes  Procedures:  EGD  Hiatal hernia repair on 9/6  Consultants:   GI General surgery   Antimicrobials:      Medications  Scheduled Meds: . enoxaparin (LOVENOX) injection  40 mg Subcutaneous Q24H  . famotidine (PEPCID) IV  20 mg  Intravenous Q12H  . feeding supplement  1 Container Oral TID BM  . folic acid  1 mg Intravenous Daily  . lip balm   Topical BID  . sodium chloride flush  3 mL Intravenous Q12H  . thiamine  100 mg Intravenous Daily   Continuous Infusions:   PRN Meds:.sodium chloride, acetaminophen, acetaminophen, alum & mag hydroxide-simeth, bisacodyl, chlorproMAZINE (THORAZINE) IV, diphenhydrAMINE, fentaNYL (SUBLIMAZE) injection, guaiFENesin-dextromethorphan, hydrALAZINE, HYDROmorphone (DILAUDID) injection, HYDROmorphone, lactated ringers, magic mouthwash, menthol-cetylpyridinium,  metoCLOPramide (REGLAN) injection, ondansetron (ZOFRAN) IV **OR** ondansetron (ZOFRAN) IV, phenol, polyethylene glycol, prochlorperazine, sodium chloride flush   Antibiotics   Anti-infectives    Start     Dose/Rate Route Frequency Ordered Stop   12/26/15 1900  ceFAZolin (ANCEF) IVPB 2g/100 mL premix     2 g 200 mL/hr over 30 Minutes Intravenous Every 8 hours 12/26/15 1642 12/27/15 0548   12/26/15 1700  metroNIDAZOLE (FLAGYL) IVPB 500 mg     500 mg 100 mL/hr over 60 Minutes Intravenous Every 6 hours 12/26/15 1642 12/27/15 0618   12/26/15 1200  metroNIDAZOLE (FLAGYL) IVPB 500 mg     500 mg 100 mL/hr over 60 Minutes Intravenous To Surgery 12/26/15 1149 12/26/15 1147   12/26/15 0800  ceFAZolin (ANCEF) IVPB 2g/100 mL premix     2 g 200 mL/hr over 30 Minutes Intravenous To [facility]Stay Surgical 12/25/15 1421 12/26/15 1124        Subjective:   Patient tells me that he had a rough course yesterday due to pain. It worsened after he ambulated in the hallway. Denies any nausea or vomiting. Otherwise, does not have any other complaints. He is slightly better this morning. Would like to see how he does after he walks today.  Objective:   Vitals:   12/27/15 1404 12/27/15 1728 12/27/15 2143 12/28/15 0608  BP: (!) 165/111 (!) 165/101 (!) 168/100 (!) 163/99  Pulse: 72 72 66 79  Resp: '18 18 18 18  '$ Temp: 98.8 F (37.1 C)  98.9 F (37.2 C) 98.2 F (36.8 C)  TempSrc: Oral  Oral Oral  SpO2: 92% 94% 92% 92%  Weight:      Height:        Intake/Output Summary (Last 24 hours) at 12/28/15 0939 Last data filed at 12/28/15 0272  Gross per 24 hour  Intake              675 ml  Output             1765 ml  Net            -1090 ml     Wt Readings from Last 3 Encounters:  12/23/15 77.1 kg (170 lb)  12/22/15 77.1 kg (170 lb)  02/11/15 81.6 kg (180 lb)     Exam  General: Awake and alert. In no distress.  Cardiovascular: S1 S2 normal regular.   Respiratory: CTA  bilaterally  Gastrointestinal: Soft, NT nondistended, sluggish bowel sounds. Drain noted.  Ext: no cyanosis clubbing or edema  Neuro: no focal deficits   Data Reviewed:  I have personally reviewed following labs and imaging studies  Micro Results Recent Results (from the past 240 hour(s))  Surgical pcr screen     Status: None   Collection Time: 12/25/15 11:29 PM  Result Value Ref Range Status   MRSA, PCR NEGATIVE NEGATIVE Final   Staphylococcus aureus NEGATIVE NEGATIVE Final    Comment:        The Xpert SA Assay (FDA approved for NASAL specimens in patients over  54 years of age), is one component of a comprehensive surveillance program.  Test performance has been validated by [facility] for patients greater than or equal to 56 year old. It is not intended to diagnose infection nor to guide or monitor treatment.     Radiology Reports Dg Chest 2 View  Result Date: 12/22/2015 CLINICAL DATA:  Chest and upper abdomen pain starting yesterday EXAM: CHEST  2 VIEW COMPARISON:  May 08, 2007 FINDINGS: The heart size and mediastinal contours are within normal limits. There is no focal infiltrate, pulmonary edema, or pleural effusion. The visualized skeletal structures are stable. There is a large hiatal hernia. IMPRESSION: No active cardiopulmonary disease. Electronically Signed   By: Abelardo Diesel M.D.   On: 12/22/2015 11:05   Ct Abdomen Pelvis W Contrast  Result Date: 12/22/2015 CLINICAL DATA:  Epigastric pain when trying to eat or drink. Symptoms began yesterday when he ate lunch. EXAM: CT ABDOMEN AND PELVIS WITH CONTRAST TECHNIQUE: Multidetector CT imaging of the abdomen and pelvis was performed using the standard protocol following bolus administration of intravenous contrast. CONTRAST:  100 cc Isovue-300 COMPARISON:  Lumbar spine films 08/30/2010, chest x-ray 12/22/2015 FINDINGS: Lower chest: There is small amount of scarring at both lung bases. Heart size is normal. Minimal  atherosclerosis at the level of the aortic valve. There is a large hiatal hernia. Hepatobiliary: Posterior to liver, superior to the right kidney, and lateral to the adrenal gland, there is a hyperdense calcified mass or collection contrast which measures 8.8 x 10.0 x 6.9 cm. Correlation with history is recommended. Has the patient had interventional procedures performed in the right upper quadrant? Pancreas: Normal in appearance. Spleen: Normal in appearance. Renal/Adrenal: The right adrenal gland appears to be normal in appearance but is in close proximity to the collection or calcified mass described above. The left adrenal gland is normal in appearance. The kidneys are normal in appearance. There is normal excretion and enhancement bilaterally. Gastrointestinal tract: Large hiatal hernia is present. Small bowel loops are normal in appearance. Numerous colonic diverticular are present. Status post appendectomy. Reproductive/Pelvis: Urinary bladder is normal in appearance. The prostate is enlarged. Seminal vesicles are symmetric. No free pelvic fluid. Vascular/Lymphatic: There is atherosclerotic calcification of the abdominal aorta. The lower thoracic aorta is tortuous. No retroperitoneal or mesenteric adenopathy. Musculoskeletal/Abdominal wall: Abdominal wall is unremarkable. Status post anterior fusion of the lumbar spine at L4-5 and L5-S1. Superior endplate fracture at L2 and wedge compression fracture of L1 or chronic and stable in appearance. Vacuum disc phenomenon at L3-4. Other: none IMPRESSION: 1. Calcified mass or collection of contrast measuring 10 cm in the right upper quadrant retroperitoneum. Recommend correlation with history guarding any prior interventional procedures, contrast injection. The mass has a benign appearance and is unlikely causing the patient's symptoms. 2. Large hiatal hernia. 3.  Aortic atherosclerosis. 4. Enlarged prostate. 5. Prior spinal surgery and spondylosis. Electronically  Signed   By: Nolon Nations M.D.   On: 12/22/2015 15:06   Dg Chest Port 1 View  Result Date: 12/23/2015 CLINICAL DATA:  Mid chest pain. EXAM: PORTABLE CHEST 1 VIEW COMPARISON:  December 22, 2015 FINDINGS: The heart size and mediastinal contours are within normal limits. There is no focal infiltrate, pulmonary edema, or pleural effusion. Hiatal hernia is identified. Overlying soft tissue is projected over the upper chest bilaterally. The visualized skeletal structures are stable. IMPRESSION: No active cardiopulmonary disease. Electronically Signed   By: Abelardo Diesel M.D.   On: 12/23/2015 09:08  Dg Esophagus W/water Sol Cm  Result Date: 12/27/2015 CLINICAL DATA:  Postop hiatal hernia repair yesterday. Evaluate for esophageal leak. EXAM: ESOPHOGRAM/BARIUM SWALLOW TECHNIQUE: Single contrast examination was performed using water-soluble contrast (Isovue-300). FLUOROSCOPY TIME:  Fluoroscopy Time:  2 minutes and 14 seconds Radiation Exposure Index (if provided by the fluoroscopic device): Number of Acquired Spot Images: 0 COMPARISON:  Abdominal pelvic CT 12/22/2015. FINDINGS: The patient swallowed the contrast without difficulty. There is mild presbyesophagus. Contrast empties from the esophagus into the stomach. There is no evidence of esophageal leak. No gastric abnormalities are identified. Mediastinal drain is in place without contrast accumulation. Large right upper quadrant abdominal calcification is again noted. IMPRESSION: 1. No evidence of esophageal leak or significant residual hiatal hernia. 2. Mild presbyesophagus. Electronically Signed   By: Richardean Sale M.D.   On: 12/27/2015 09:52    Lab Data:  CBC:  Recent Labs Lab 12/22/15 1015  12/24/15 0234 12/25/15 0540 12/26/15 0439 12/27/15 0458 12/28/15 0424  WBC 11.2*  < > 12.5* 7.2 6.9 10.6* 8.7  NEUTROABS 9.8*  --   --   --   --   --   --   HGB 15.7  < > 13.8 13.1 13.3 13.6 13.5  HCT 44.8  < > 41.1 39.8 39.4 39.9 39.4  MCV 93.5  < >  96.3 96.6 94.7 94.5 93.6  PLT 252  < > 220 199 214 229 212  < > = values in this interval not displayed. Basic Metabolic Panel:  Recent Labs Lab 12/23/15 1342 12/24/15 0234 12/25/15 0540 12/26/15 0439 12/27/15 0458 12/28/15 0424  NA  --  138 140 136 136 135  K  --  3.8 3.4* 3.4* 3.8 3.2*  CL  --  106 107 99* 100* 100*  CO2  --  '27 28 24 27 26  '$ GLUCOSE  --  115* 85 74 96 117*  BUN  --  '12 11 11 8 6  '$ CREATININE  --  0.88 0.94 0.96 0.90 0.81  CALCIUM  --  8.8* 8.5* 8.2* 8.6* 8.3*  MG 1.8  --   --   --   --   --   PHOS 1.6* 3.2  --   --   --   --    GFR: Estimated Creatinine Clearance: 92.5 mL/min (by C-G formula based on SCr of 0.81 mg/dL). Liver Function Tests:  Recent Labs Lab 12/22/15 1015 12/23/15 0851 12/24/15 0234  AST '21 26 19  '$ ALT 17 16* 14*  ALKPHOS 95 96 79  BILITOT 0.8 1.1 0.6  PROT 6.8 6.6 5.8*  ALBUMIN 4.2 4.1 3.6    Recent Labs Lab 12/22/15 1015 12/23/15 0851  LIPASE 76* 55*    Lachrisha Ziebarth M.D. [facility] 12/28/2015, 9:39 AM  Pager: [phone]  Between 7am to 7pm - call Pager - [phone]  After 7pm go to [email or web address] - password [email or web address]  Call night coverage person covering after 7pm

## 2015-12-28 NOTE — Care Management Important Message (Signed)
Important Message  Patient Details  Name: William Avila MRN: [id number] Date of Birth: Dec 27, 1945   Medicare Important Message Given:  Yes    Shelene Krage Montine Circle 12/28/2015, 10:56 AM

## 2015-12-28 NOTE — Progress Notes (Signed)
Patient ID: William Avila, male   DOB: [birth year], 70 y.o.   MRN: [id number]  [facility] Surgery Progress Note  2 Days Post-Op  Subjective: Patient reports he struggled with pain yesterday afternoon. His pain increased after activity. He is feeling somewhat better this morning. A little nervous to get out and walk again today in fear that his pain will get worse. He did have some nausea yesterday but no vomiting.  Using iv dilaudid for pain control, states he cannot tolerate morphine. His allergy list reports anaphylactic reaction to codeine, but [name] states that he has tolerated hydrocodone in the past; does not think he can tolerate percocet.  Objective: Vital signs in last 24 hours: Temp:  [98.2 F (36.8 C)-98.9 F (37.2 C)] 98.2 F (36.8 C) (09/08 8144) Pulse Rate:  [66-79] 79 (09/08 0608) Resp:  [18] 18 (09/08 0608) BP: (163-168)/(99-111) 163/99 (09/08 0608) SpO2:  [92 %-94 %] 92 % (09/08 0608) Last BM Date: 12/22/15  Intake/Output from previous day: 09/07 0701 - 09/08 0700 In: 915 [P.O.:837; I.V.:3; IV Piggyback:75] Out: 8185 [UDJSH:7026; Drains:90] Intake/Output this shift: No intake/output data recorded.  PE: Gen:  Alert, NAD, pleasant Card:  RRR Pulm:  CTAB Abd: Soft, ND, appropriately tender, +BS, incisions covered with clean/dry dressings, RLQ JP drain dressing covered with bright red blood, minimal serosanguinous drainage output  RLQ JP drain 90cc output in 24 hour period  Lab Results:   Recent Labs  12/27/15 0458 12/28/15 0424  WBC 10.6* 8.7  HGB 13.6 13.5  HCT 39.9 39.4  PLT 229 212   BMET  Recent Labs  12/27/15 0458 12/28/15 0424  NA 136 135  K 3.8 3.2*  CL 100* 100*  CO2 27 26  GLUCOSE 96 117*  BUN 8 6  CREATININE 0.90 0.81  CALCIUM 8.6* 8.3*   PT/INR No results for input(s): LABPROT, INR in the last 72 hours. CMP     Component Value Date/Time   NA 135 12/28/2015 0424   K 3.2 (L) 12/28/2015 0424   CL 100 (L)  12/28/2015 0424   CO2 26 12/28/2015 0424   GLUCOSE 117 (H) 12/28/2015 0424   BUN 6 12/28/2015 0424   CREATININE 0.81 12/28/2015 0424   CALCIUM 8.3 (L) 12/28/2015 0424   PROT 5.8 (L) 12/24/2015 0234   ALBUMIN 3.6 12/24/2015 0234   AST 19 12/24/2015 0234   ALT 14 (L) 12/24/2015 0234   ALKPHOS 79 12/24/2015 0234   BILITOT 0.6 12/24/2015 0234   GFRNONAA >60 12/28/2015 0424   GFRAA >60 12/28/2015 0424   Lipase     Component Value Date/Time   LIPASE 55 (H) 12/23/2015 0851       Studies/Results: Dg Esophagus W/water Sol Cm  Result Date: 12/27/2015 CLINICAL DATA:  Postop hiatal hernia repair yesterday. Evaluate for esophageal leak. EXAM: ESOPHOGRAM/BARIUM SWALLOW TECHNIQUE: Single contrast examination was performed using water-soluble contrast (Isovue-300). FLUOROSCOPY TIME:  Fluoroscopy Time:  2 minutes and 14 seconds Radiation Exposure Index (if provided by the fluoroscopic device): Number of Acquired Spot Images: 0 COMPARISON:  Abdominal pelvic CT 12/22/2015. FINDINGS: The patient swallowed the contrast without difficulty. There is mild presbyesophagus. Contrast empties from the esophagus into the stomach. There is no evidence of esophageal leak. No gastric abnormalities are identified. Mediastinal drain is in place without contrast accumulation. Large right upper quadrant abdominal calcification is again noted. IMPRESSION: 1. No evidence of esophageal leak or significant residual hiatal hernia. 2. Mild presbyesophagus. Electronically Signed   By: Richardean Sale  M.D.   On: 12/27/2015 09:52    Anti-infectives: Anti-infectives    Start     Dose/Rate Route Frequency Ordered Stop   12/26/15 1900  ceFAZolin (ANCEF) IVPB 2g/100 mL premix     2 g 200 mL/hr over 30 Minutes Intravenous Every 8 hours 12/26/15 1642 12/27/15 0548   12/26/15 1700  metroNIDAZOLE (FLAGYL) IVPB 500 mg     500 mg 100 mL/hr over 60 Minutes Intravenous Every 6 hours 12/26/15 1642 12/27/15 0618   12/26/15 1200   metroNIDAZOLE (FLAGYL) IVPB 500 mg     500 mg 100 mL/hr over 60 Minutes Intravenous To Surgery 12/26/15 1149 12/26/15 1147   12/26/15 0800  ceFAZolin (ANCEF) IVPB 2g/100 mL premix     2 g 200 mL/hr over 30 Minutes Intravenous To [facility] Surgical 12/25/15 1421 12/26/15 1124       Assessment/Plan S/p procedures Dr. Johney Maine 12/26/15: 1. Laparoscopic reduction of paraesophageal hiatal hernia 2. Type II mediastinal dissection. 3. Primary repair of hiatal hernia over pledgets. 3.5 Mesh reinforcement with absorbable 4. Anterior & posterior gastropexy. 5. Nissen fundoplication 2 cm over a 56-French bougie - POD 2 - struggled with pain yesterday afternoon, feeling better this morning - clear liquid diet - encouraged ambulation - 90cc output from RLQ drain -- will keep drain in place until output is less  LA Grade D esophagitis - continue BID PPD Calcified right adrenal mass.Chronic. Followed by Dr. [name]. Leukocytosis - normalized, 8.7 today HTN - hydralazine and amlodipine PRN  VTE - lovenox, SCD's FEN - clear liquids.   Plan - depending on pain management may be able to go home this afternoon. He will try PO liquid dilaudid for pain control. Encouraged ambulation. He will need to be discharged home with medications for pain, nausea, PPI. Any tablets he knows to crush. Follow-up is scheduled. Drain will remain in place.   LOS: 3 days    Jerrye Beavers , [facility] Surgery 12/28/2015, 8:38 AM Pager: [phone] Consults: [phone] Mon-Fri 7:00 am-4:30 pm Sat-Sun 7:00 am-11:30 am

## 2015-12-28 NOTE — Progress Notes (Deleted)
Patient ID: William Avila, male   DOB: 1945/09/12, 70 y.o.   MRN: [id number] Your first appointment is for a drain check on 01/07/16 at 10am. Please arrive at least 30 min before your appointment to complete your check in paperwork.  If you are unable to arrive 30 min prior to your appointment time we may have to cancel or reschedule you.  Your second appointment is at 01/15/16 at 9:15am.    EATING AFTER YOUR ESOPHAGEAL SURGERY (Stomach Fundoplication, Hiatal Hernia repair, Achalasia surgery, etc)  ######################################################################  EAT Start with a pureed / full liquid diet (see below) Gradually transition to a high fiber diet with a fiber supplement over the next month after discharge.    WALK Walk an hour a day.  Control your pain to do that.    CONTROL PAIN Control pain so that you can walk, sleep, tolerate sneezing/coughing, go up/down stairs.  HAVE A BOWEL MOVEMENT DAILY Keep your bowels regular to avoid problems.  OK to try a laxative to override constipation.  OK to use an antidairrheal to slow down diarrhea.  Call if not better after 2 tries  CALL IF YOU HAVE PROBLEMS/CONCERNS Call if you are still struggling despite following these instructions. Call if you have concerns not answered by these instructions  ######################################################################   After your esophageal surgery, expect some sticking with swallowing over the next 1-2 months.    If food sticks when you eat, it is called "dysphagia".  This is due to swelling around your esophagus at the wrap & hiatal diaphragm repair.  It will gradually ease off over the next few months.  To help you through this temporary phase, we start you out on a pureed (blenderized) diet.  Your first meal in the hospital was thin liquids.  You should have been given a pureed diet by the time you left the hospital.  We ask patients to stay on a pureed diet for the  first 2-3 weeks to avoid anything getting "stuck" near your recent surgery.  Don't be alarmed if your ability to swallow doesn't progress according to this plan.  Everyone is different and some diets can advance more or less quickly.     Some BASIC RULES to follow are:  Maintain an upright position whenever eating or drinking.  Take small bites - just a teaspoon size bite at a time.  Eat slowly.  It may also help to eat only one food at a time.  Consider nibbling through smaller, more frequent meals & avoid the urge to eat BIG meals  Do not push through feelings of fullness, nausea, or bloatedness  Do not mix solid foods and liquids in the same mouthful  Try not to "wash foods down" with large gulps of liquids.  Avoid carbonated (bubbly/fizzy) drinks.    Avoid foods that make you feel gassy or bloated.  Start with bland foods first.  Wait on trying greasy, fried, or spicy meals until you are tolerating more bland solids well.  Understand that it will be hard to burp and belch at first.  This gradually improves with time.  Expect to be more gassy/flatulent/bloated initially.  Walking will help your body manage it better.  Consider using medications for bloating that contain simethicone such as  Maalox or Gas-X   Eat in a relaxed atmosphere & minimize distractions.  Avoid talking while eating.    Do not use straws.  Following each meal, sit in an upright position (90 degree angle) for 60  to 90 minutes.  Going for a short walk can help as well  If food does stick, don't panic.  Try to relax and let the food pass on its own.  Sipping WARM LIQUID such as strong hot black tea can also help slide it down.   Be gradual in changes & use common sense:  -If you easily tolerating a certain "level" of foods, advance to the next level gradually -If you are having trouble swallowing a particular food, then avoid it.   -If food is sticking when you advance your diet, go back to thinner  previous diet (the lower LEVEL) for 1-2 days.  LEVEL 1 = PUREED DIET  Do for the first 2 WEEKS AFTER SURGERY  -Foods in this group are pureed or blenderized to a smooth, mashed potato-like consistency.  -If necessary, the pureed foods can keep their shape with the addition of a thickening agent.   -Meat should be pureed to a smooth, pasty consistency.  Hot broth or gravy may be added to the pureed meat, approximately 1 oz. of liquid per 3 oz. serving of meat. -CAUTION:  If any foods do not puree into a smooth consistency, swallowing will be more difficult.  (For example, nuts or seeds sometimes do not blend well.)  Hot Foods Cold Foods  Pureed scrambled eggs and cheese Pureed cottage cheese  Baby cereals Thickened juices and nectars  Thinned cooked cereals (no lumps) Thickened milk or eggnog  Pureed [place] toast or pancakes Ensure  Mashed potatoes Ice cream  Pureed parsley, au gratin, scalloped potatoes, candied sweet potatoes Fruit or [place] ice, sherbet  Pureed buttered or alfredo noodles Plain yogurt  Pureed vegetables (no corn or peas) Instant breakfast  Pureed soups and creamed soups Smooth pudding, mousse, custard  Pureed scalloped apples Whipped gelatin  Gravies Sugar, syrup, honey, jelly  Sauces, cheese, tomato, barbecue, white, creamed Cream  Any baby food Creamer  Alcohol in moderation (not beer or champagne) Margarine  Coffee or tea Mayonnaise   Ketchup, mustard   Apple sauce   SAMPLE MENU:  PUREED DIET Breakfast Lunch Dinner   Orange juice, 1/2 cup  Cream of wheat, 1/2 cup  Pineapple juice, 1/2 cup  Pureed [place], barley soup, 3/4 cup  Pureed Hawaiian chicken, 3 oz   Scrambled eggs, mashed or blended with cheese, 1/2 cup  Tea or coffee, 1 cup   Whole milk, 1 cup   Non-dairy creamer, 2 Tbsp.  Mashed potatoes, 1/2 cup  Pureed cooled broccoli, 1/2 cup  Apple sauce, 1/2 cup  Coffee or tea  Mashed potatoes, 1/2 cup  Pureed spinach, 1/2 cup  Frozen  yogurt, 1/2 cup  Tea or coffee      LEVEL 2 = SOFT DIET  After your first 2 weeks, you can advance to a soft diet.   Keep on this diet until everything goes down easily.  Hot Foods Cold Foods  White fish Cottage cheese  Stuffed fish Junior baby fruit  Baby food meals Semi thickened juices  Minced soft cooked, scrambled, poached eggs nectars  Souffle & omelets Ripe mashed bananas  Cooked cereals Canned fruit, pineapple sauce, milk  potatoes Milkshake  Buttered or Alfredo noodles Custard  Cooked cooled vegetable Puddings, including tapioca  Sherbet Yogurt  Vegetable soup or alphabet soup Fruit ice, [place] ice  Gravies Whipped gelatin  Sugar, syrup, honey, jelly Junior baby desserts  Sauces:  Cheese, creamed, barbecue, tomato, white Cream  Coffee or tea Margarine   SAMPLE MENU:  LEVEL 2 Breakfast Lunch Dinner   Orange juice, 1/2 cup  Oatmeal, 1/2 cup  Scrambled eggs with cheese, 1/2 cup  Decaffeinated tea, 1 cup  Whole milk, 1 cup  Non-dairy creamer, 2 Tbsp  Pineapple juice, 1/2 cup  Minced beef, 3 oz  Gravy, 2 Tbsp  Mashed potatoes, 1/2 cup  Minced fresh broccoli, 1/2 cup  Applesauce, 1/2 cup  Coffee, 1 cup  [place], barley soup, 3/4 cup  Minced Hawaiian chicken, 3 oz  Mashed potatoes, 1/2 cup  Cooked spinach, 1/2 cup  Frozen yogurt, 1/2 cup  Non-dairy creamer, 2 Tbsp      LEVEL 3 = CHOPPED DIET  -After all the foods in level 2 (soft diet) are passing through well you should advance up to more chopped foods.  -It is still important to cut these foods into small pieces and eat slowly.  Hot Foods Cold Foods  Poultry Cottage cheese  Chopped Swedish meatballs Yogurt  Meat salads (ground or flaked meat) Milk  Flaked fish (tuna) Milkshakes  Poached or scrambled eggs Soft, cold, dry cereal  Souffles and omelets Fruit juices or nectars  Cooked cereals Chopped canned fruit  Chopped [place] toast or pancakes Canned fruit cocktail  Noodles or  pasta (no rice) Pudding, mousse, custard  Cooked vegetables (no frozen peas, corn, or mixed vegetables) Green salad  Canned small sweet peas Ice cream  Creamed soup or vegetable soup Fruit ice, [place] ice  Pureed vegetable soup or alphabet soup Non-dairy creamer  Ground scalloped apples Margarine  Gravies Mayonnaise  Sauces:  Cheese, creamed, barbecue, tomato, white Ketchup  Coffee or tea Mustard   SAMPLE MENU:  LEVEL 3 Breakfast Lunch Dinner   Orange juice, 1/2 cup  Oatmeal, 1/2 cup  Scrambled eggs with cheese, 1/2 cup  Decaffeinated tea, 1 cup  Whole milk, 1 cup  Non-dairy creamer, 2 Tbsp  Ketchup, 1 Tbsp  Margarine, 1 tsp  Salt, 1/4 tsp  Sugar, 2 tsp  Pineapple juice, 1/2 cup  Ground beef, 3 oz  Gravy, 2 Tbsp  Mashed potatoes, 1/2 cup  Cooked spinach, 1/2 cup  Applesauce, 1/2 cup  Decaffeinated coffee  Whole milk  Non-dairy creamer, 2 Tbsp  Margarine, 1 tsp  Salt, 1/4 tsp  Pureed [place], barley soup, 3/4 cup  Barbecue chicken, 3 oz  Mashed potatoes, 1/2 cup  Ground fresh broccoli, 1/2 cup  Frozen yogurt, 1/2 cup  Decaffeinated tea, 1 cup  Non-dairy creamer, 2 Tbsp  Margarine, 1 tsp  Salt, 1/4 tsp  Sugar, 1 tsp    LEVEL 4:  REGULAR FOODS  -Foods in this group are soft, moist, regularly textured foods.   -This level includes meat and breads, which tend to be the hardest things to swallow.   -Eat very slowly, chew well and continue to avoid carbonated drinks. -most people are at this level in 4-6 weeks  Hot Foods Cold Foods  Baked fish or skinned Soft cheeses - cottage cheese  Souffles and omelets Cream cheese  Eggs Yogurt  Stuffed shells Milk  Spaghetti with meat sauce Milkshakes  Cooked cereal Cold dry cereals (no nuts, dried fruit, coconut)  [place] toast or pancakes Crackers  Buttered toast Fruit juices or nectars  Noodles or pasta (no rice) Canned fruit  Potatoes (all types) Ripe bananas  Soft, cooked vegetables (no  corn, lima, or baked beans) Peeled, ripe, fresh fruit  Creamed soups or vegetable soup Cakes (no nuts, dried fruit, coconut)  Canned chicken noodle soup Plain doughnuts  Gravies Ice  cream  Bacon dressing Pudding, mousse, custard  Sauces:  Cheese, creamed, barbecue, tomato, white Fruit ice, [place] ice, sherbet  Decaffeinated tea or coffee Whipped gelatin  Pork chops Regular gelatin   Canned fruited gelatin molds   Sugar, syrup, honey, jam, jelly   Cream   Non-dairy   Margarine   Oil   Mayonnaise   Ketchup   Mustard   TROUBLESHOOTING IRREGULAR BOWELS  1) Avoid extremes of bowel movements (no bad constipation/diarrhea)  2) Miralax 17gm mixed in 8oz. water or juice-daily. May use BID as needed.  3) Gas-x,Phazyme, etc. as needed for gas & bloating.  4) Soft,bland diet. No spicy,greasy,fried foods.  5) Prilosec over-the-counter as needed  6) May hold gluten/wheat products from diet to see if symptoms improve.  7) May try probiotics (Align, Activa, etc) to help calm the bowels down  7) If symptoms become worse call back immediately.    If you have any questions please call our office at [place]: [phone].  LAPAROSCOPIC SURGERY: POST OP INSTRUCTIONS  ######################################################################  EAT Gradually transition to a high fiber diet with a fiber supplement over the next few weeks after discharge.  Start with a pureed / full liquid diet (see below)  WALK Walk an hour a day.  Control your pain to do that.    CONTROL PAIN Control pain so that you can walk, sleep, tolerate sneezing/coughing, go up/down stairs.  HAVE A BOWEL MOVEMENT DAILY Keep your bowels regular to avoid problems.  OK to try a laxative to override constipation.  OK to use an antidairrheal to slow down diarrhea.  Call if not better after 2 tries  CALL IF YOU HAVE PROBLEMS/CONCERNS Call if you are still struggling despite following these instructions. Call  if you have concerns not answered by these instructions  ######################################################################    1. DIET: Follow a light bland diet the first 24 hours after arrival home, such as soup, liquids, crackers, etc.  Be sure to include lots of fluids daily.  Avoid fast food or heavy meals as your are more likely to get nauseated.  Eat a low fat the next few days after surgery.   2. Take your usually prescribed home medications unless otherwise directed. 3. PAIN CONTROL: a. Pain is best controlled by a usual combination of three different methods TOGETHER: i. Ice/Heat ii. Over the counter pain medication iii. Prescription pain medication b. Most patients will experience some swelling and bruising around the incisions.  Ice packs or heating pads (30-60 minutes up to 6 times a day) will help. Use ice for the first few days to help decrease swelling and bruising, then switch to heat to help relax tight/sore spots and speed recovery.  Some people prefer to use ice alone, heat alone, alternating between ice & heat.  Experiment to what works for you.  Swelling and bruising can take several weeks to resolve.   c. It is helpful to take an over-the-counter pain medication regularly for the first few weeks.  Choose one of the following that works best for you: i. Naproxen (Aleve, etc)  Two '220mg'$  tabs twice a day ii. Ibuprofen (Advil, etc) Three '200mg'$  tabs four times a day (every meal & bedtime) iii. Acetaminophen (Tylenol, etc) 500-'650mg'$  four times a day (every meal & bedtime) d. A  prescription for pain medication (such as oxycodone, hydrocodone, etc) should be given to you upon discharge.  Take your pain medication as prescribed.  i. If you are having problems/concerns with the prescription  medicine (does not control pain, nausea, vomiting, rash, itching, etc), please call us [phone] to see if we need to switch you to a different pain medicine that will work better for you  and/or control your side effect better. ii. If you need a refill on your pain medication, please contact your pharmacy.  They will contact our office to request authorization. Prescriptions will not be filled after 5 pm or on week-ends. 4. Avoid getting constipated.  Between the surgery and the pain medications, it is common to experience some constipation.  Increasing fluid intake and taking a fiber supplement (such as Metamucil, Citrucel, FiberCon, MiraLax, etc) 1-2 times a day regularly will usually help prevent this problem from occurring.  A mild laxative (prune juice, Milk of Magnesia, MiraLax, etc) should be taken according to package directions if there are no bowel movements after 48 hours.   5. Watch out for diarrhea.  If you have many loose bowel movements, simplify your diet to bland foods & liquids for a few days.  Stop any stool softeners and decrease your fiber supplement.  Switching to mild anti-diarrheal medications (Kayopectate, Pepto Bismol) can help.  If this worsens or does not improve, please call us. 6. Wash / shower every day.  You may shower over the dressings as they are waterproof.  Continue to shower over incision(s) after the dressing is off. 7. Remove your waterproof bandages 5 days after surgery.  You may leave the incision open to air.  You may replace a dressing/Band-Aid to cover the incision for comfort if you wish.  8. ACTIVITIES as tolerated:   a. You may resume regular (light) daily activities beginning the next day-such as daily self-care, walking, climbing stairs-gradually increasing activities as tolerated.  If you can walk 30 minutes without difficulty, it is safe to try more intense activity such as jogging, treadmill, bicycling, low-impact aerobics, swimming, etc. b. Save the most intensive and strenuous activity for last such as sit-ups, heavy lifting, contact sports, etc  Refrain from any heavy lifting or straining until you are off narcotics for pain control.    c. DO NOT PUSH THROUGH PAIN.  Let pain be your guide: If it hurts to do something, don't do it.  Pain is your body warning you to avoid that activity for another week until the pain goes down. d. You may drive when you are no longer taking prescription pain medication, you can comfortably wear a seatbelt, and you can safely maneuver your car and apply brakes. e. Dennis Bast may have sexual intercourse when it is comfortable.  9. FOLLOW UP in our office a. Please call [facility] at [phone] to set up an appointment to see your surgeon in the office for a follow-up appointment approximately 2-3 weeks after your surgery. b. Make sure that you call for this appointment the day you arrive home to insure a convenient appointment time. 10. IF YOU HAVE DISABILITY OR FAMILY LEAVE FORMS, BRING THEM TO THE OFFICE FOR PROCESSING.  DO NOT GIVE THEM TO YOUR DOCTOR.   WHEN TO CALL us [phone]: 1. Poor pain control 2. Reactions / problems with new medications (rash/itching, nausea, etc)  3. Fever over 101.5 F (38.5 C) 4. Inability to urinate 5. Nausea and/or vomiting 6. Worsening swelling or bruising 7. Continued bleeding from incision. 8. Increased pain, redness, or drainage from the incision   The clinic staff is available to answer your questions during regular business hours (8:30am-5pm).  Please don't hesitate to call  and ask to speak to one of our nurses for clinical concerns.   If you have a medical emergency, go to the nearest emergency room or call 911.  A surgeon from [facility] is always on call at the [facility], [address], [place], [place], [place]  [address] ? MAIN: [phone] ? TOLL FREE: [phone] ?  FAX [phone] [email or web address]

## 2015-12-28 NOTE — Progress Notes (Signed)
   12/28/15 0714  Orthostatic Lying   BP- Lying (!) 177/106  Pulse- Lying 78  Orthostatic Sitting  BP- Sitting (!) 167/115  Pulse- Sitting 91  Orthostatic Standing at 3 minutes  BP- Standing at 3 minutes (!) 166/122  Pulse- Standing at 3 minutes 99  Dr. Maryland Pink notified of BP

## 2015-12-28 NOTE — Discharge Summary (Signed)
[facility]  Physician Discharge Summary   Patient ID: William Avila MRN: [id number] DOB/AGE: 06-09-1945 70 y.o.  Admit date: 12/23/2015 Discharge date: 12/29/2015  PCP: Irven Shelling, MD  DISCHARGE DIAGNOSES:  Principal Problem:   Incarcerated hiatal hernia s/p lap repair 12/26/2015 Active Problems:   Epigastric pain   Dehydration   Protein-calorie malnutrition, moderate (HCC)   Leukocytosis   Retroperitoneal mass-large calcified mass, present since 2015   RECOMMENDATIONS FOR OUTPATIENT FOLLOW UP: 1. Close follow-up with general surgery   DISCHARGE CONDITION: fair  Diet recommendation: Clear liquids only for now. Crush all medications.  Filed Weights   12/23/15 0822  Weight: 77.1 kg (170 lb)    INITIAL HISTORY: William L Williamsonis a 70 y.o.malewith medical history significant for remote appendectomy, known large calcified right upper quadrant retroperitoneal mass present since 2015. Patient reported mild postprandial epigastric discomfort with bloating for about 6 months. He managed this by increasing meal frequency and decreasing meal size. Over the past week has had increasing epigastric discomfort with significant colicky abdominal pain immediate postprandial and as of the past 2 days has had pain not only associated with eating but pain not definitively related to eating food. Patient had CT abdomen and pelvis on 9/2 which showed very large hiatal hernia. Calcified mass or collection measuring 10 cm in the right upper quadrant of the retroperitoneum Patient was admitted for further workup  Consultations: GI General surgery  Procedures: EGD  Hiatal hernia repair on 9/6  HOSPITAL COURSE:   Large Hiatal herniawith associated colicky Epigastric pain -Patient reported abdominal pain and emesis, inability to keep down even liquid foods or medications. Large hiatal hernia without evidence of volvulus formation documented on CT of the abdomen and  pelvis performed 9/2 - GI was consulted. Patient underwent EGD on 9/4 which showed large hiatal hernia, large paraesophageal hernia. Recommended surgical consultation for hernia repair.  - Patient underwent hernia repair on 9/6. - Patient is on clear liquids per general surgery, which will be continued. Patient's pain in the left upper abdomen and lower chest or due to his surgery. Pain has improved. Drain will be left in place for now. Follow-up with general surgery as per their instructions.  Elevated blood pressure without known history of hypertension Elevated BP could be due to pain issues. However, blood pressures are quite significantly elevated. Initially plan was to initiate amlodipine. However, patient has had difficulty swallowing tablets. Patient's old records reviewed. He has had long-standing elevated blood pressure but has not been initiated on any treatment. I think patient would benefit from getting started on antihypertensive and this can be monitored closely in the outpatient setting. Patient started on hydralazine. Blood pressure has improved some.  Dehydration -Improved. Can maintain oral hydration. Potassium was repleted.  Leukocytosis - Likely reflective of dehydration. Lactic acid normal. WBC now normal.  Protein calorie malnutrition, hypophosphatemia - Repleted phosphorous, now level close to normal  Retroperitoneal mass-large calcified mass thought to be adrenal, present since 2015 -Has been previously evaluated by Dr. Donne Hazel in the outpatient setting (see ER documentation from 9/2 regarding details)  Patient is stable. He did have worsening pain 9/7 after he ambulated. Pain improved yesterday. He was monitored an additional day. Feels much better this morning. Okay for discharge. Discussed with surgeon this morning.    PERTINENT LABS:  The results of significant diagnostics from this hospitalization (including imaging, microbiology, ancillary and  laboratory) are listed below for reference.    Microbiology: Recent Results (from the past 240  hour(s))  Surgical pcr screen     Status: None   Collection Time: 12/25/15 11:29 PM  Result Value Ref Range Status   MRSA, PCR NEGATIVE NEGATIVE Final   Staphylococcus aureus NEGATIVE NEGATIVE Final    Comment:        The Xpert SA Assay (FDA approved for NASAL specimens in patients over 41 years of age), is one component of a comprehensive surveillance program.  Test performance has been validated by [facility] for patients greater than or equal to 78 year old. It is not intended to diagnose infection nor to guide or monitor treatment.      Labs: Basic Metabolic Panel:  Recent Labs Lab 12/23/15 1342 12/24/15 0234 12/25/15 0540 12/26/15 0439 12/27/15 0458 12/28/15 0424  NA  --  138 140 136 136 135  K  --  3.8 3.4* 3.4* 3.8 3.2*  CL  --  106 107 99* 100* 100*  CO2  --  '27 28 24 27 26  '$ GLUCOSE  --  115* 85 74 96 117*  BUN  --  '12 11 11 8 6  '$ CREATININE  --  0.88 0.94 0.96 0.90 0.81  CALCIUM  --  8.8* 8.5* 8.2* 8.6* 8.3*  MG 1.8  --   --   --   --   --   PHOS 1.6* 3.2  --   --   --   --    Liver Function Tests:  Recent Labs Lab 12/23/15 0851 12/24/15 0234  AST 26 19  ALT 16* 14*  ALKPHOS 96 79  BILITOT 1.1 0.6  PROT 6.6 5.8*  ALBUMIN 4.1 3.6    Recent Labs Lab 12/23/15 0851  LIPASE 55*   CBC:  Recent Labs Lab 12/24/15 0234 12/25/15 0540 12/26/15 0439 12/27/15 0458 12/28/15 0424  WBC 12.5* 7.2 6.9 10.6* 8.7  HGB 13.8 13.1 13.3 13.6 13.5  HCT 41.1 39.8 39.4 39.9 39.4  MCV 96.3 96.6 94.7 94.5 93.6  PLT 220 199 214 229 212      IMAGING STUDIES Dg Chest 2 View  Result Date: 12/22/2015 CLINICAL DATA:  Chest and upper abdomen pain starting yesterday EXAM: CHEST  2 VIEW COMPARISON:  May 08, 2007 FINDINGS: The heart size and mediastinal contours are within normal limits. There is no focal infiltrate, pulmonary edema, or pleural effusion. The  visualized skeletal structures are stable. There is a large hiatal hernia. IMPRESSION: No active cardiopulmonary disease. Electronically Signed   By: Abelardo Diesel M.D.   On: 12/22/2015 11:05   Ct Abdomen Pelvis W Contrast  Result Date: 12/22/2015 CLINICAL DATA:  Epigastric pain when trying to eat or drink. Symptoms began yesterday when he ate lunch. EXAM: CT ABDOMEN AND PELVIS WITH CONTRAST TECHNIQUE: Multidetector CT imaging of the abdomen and pelvis was performed using the standard protocol following bolus administration of intravenous contrast. CONTRAST:  100 cc Isovue-300 COMPARISON:  Lumbar spine films 08/30/2010, chest x-ray 12/22/2015 FINDINGS: Lower chest: There is small amount of scarring at both lung bases. Heart size is normal. Minimal atherosclerosis at the level of the aortic valve. There is a large hiatal hernia. Hepatobiliary: Posterior to liver, superior to the right kidney, and lateral to the adrenal gland, there is a hyperdense calcified mass or collection contrast which measures 8.8 x 10.0 x 6.9 cm. Correlation with history is recommended. Has the patient had interventional procedures performed in the right upper quadrant? Pancreas: Normal in appearance. Spleen: Normal in appearance. Renal/Adrenal: The right adrenal gland appears to be  normal in appearance but is in close proximity to the collection or calcified mass described above. The left adrenal gland is normal in appearance. The kidneys are normal in appearance. There is normal excretion and enhancement bilaterally. Gastrointestinal tract: Large hiatal hernia is present. Small bowel loops are normal in appearance. Numerous colonic diverticular are present. Status post appendectomy. Reproductive/Pelvis: Urinary bladder is normal in appearance. The prostate is enlarged. Seminal vesicles are symmetric. No free pelvic fluid. Vascular/Lymphatic: There is atherosclerotic calcification of the abdominal aorta. The lower thoracic aorta is  tortuous. No retroperitoneal or mesenteric adenopathy. Musculoskeletal/Abdominal wall: Abdominal wall is unremarkable. Status post anterior fusion of the lumbar spine at L4-5 and L5-S1. Superior endplate fracture at L2 and wedge compression fracture of L1 or chronic and stable in appearance. Vacuum disc phenomenon at L3-4. Other: none IMPRESSION: 1. Calcified mass or collection of contrast measuring 10 cm in the right upper quadrant retroperitoneum. Recommend correlation with history guarding any prior interventional procedures, contrast injection. The mass has a benign appearance and is unlikely causing the patient's symptoms. 2. Large hiatal hernia. 3.  Aortic atherosclerosis. 4. Enlarged prostate. 5. Prior spinal surgery and spondylosis. Electronically Signed   By: Nolon Nations M.D.   On: 12/22/2015 15:06   Dg Chest Port 1 View  Result Date: 12/23/2015 CLINICAL DATA:  Mid chest pain. EXAM: PORTABLE CHEST 1 VIEW COMPARISON:  December 22, 2015 FINDINGS: The heart size and mediastinal contours are within normal limits. There is no focal infiltrate, pulmonary edema, or pleural effusion. Hiatal hernia is identified. Overlying soft tissue is projected over the upper chest bilaterally. The visualized skeletal structures are stable. IMPRESSION: No active cardiopulmonary disease. Electronically Signed   By: Abelardo Diesel M.D.   On: 12/23/2015 09:08   Dg Esophagus W/water Sol Cm  Result Date: 12/27/2015 CLINICAL DATA:  Postop hiatal hernia repair yesterday. Evaluate for esophageal leak. EXAM: ESOPHOGRAM/BARIUM SWALLOW TECHNIQUE: Single contrast examination was performed using water-soluble contrast (Isovue-300). FLUOROSCOPY TIME:  Fluoroscopy Time:  2 minutes and 14 seconds Radiation Exposure Index (if provided by the fluoroscopic device): Number of Acquired Spot Images: 0 COMPARISON:  Abdominal pelvic CT 12/22/2015. FINDINGS: The patient swallowed the contrast without difficulty. There is mild presbyesophagus.  Contrast empties from the esophagus into the stomach. There is no evidence of esophageal leak. No gastric abnormalities are identified. Mediastinal drain is in place without contrast accumulation. Large right upper quadrant abdominal calcification is again noted. IMPRESSION: 1. No evidence of esophageal leak or significant residual hiatal hernia. 2. Mild presbyesophagus. Electronically Signed   By: Richardean Sale M.D.   On: 12/27/2015 09:52    DISCHARGE EXAMINATION: Vitals:   12/28/15 2111 12/28/15 2203 12/29/15 0500 12/29/15 0615  BP: (!) 183/112 (!) 155/82 (!) 175/92 (!) 158/84  Pulse: 76 98 76   Resp: '16 16 16   '$ Temp: 98.6 F (37 C) 98.7 F (37.1 C) 98.8 F (37.1 C)   TempSrc: Oral Oral Oral   SpO2: 97% 97% 98%   Weight:      Height:       General appearance: alert, cooperative, appears stated age and no distress Resp: clear to auscultation bilaterally Cardio: regular rate and rhythm, S1, S2 normal, no murmur, click, rub or gallop GI: Drain in place Extremities: extremities normal, atraumatic, no cyanosis or edema  DISPOSITION: Home  Discharge Instructions    Call MD for:    Complete by:  As directed   Temperature > 101.22F   Call MD for:  extreme fatigue  Complete by:  As directed   Call MD for:  hives    Complete by:  As directed   Call MD for:  persistant nausea and vomiting    Complete by:  As directed   Call MD for:  redness, tenderness, or signs of infection (pain, swelling, redness, odor or green/yellow discharge around incision site)    Complete by:  As directed   Call MD for:  severe uncontrolled pain    Complete by:  As directed   Diet general    Complete by:  As directed   See "Esophageal surgery" handout.  In general, start out with liquids and blenderized / pureed meals.  Foods.  Gradually advance to solid diet over the next month   Discharge instructions    Complete by:  As directed   Please see discharge instruction sheets.   Also refer to any  handouts/printouts that may have been given from the [place] surgery office (if you visited [place] there before surgery) Please call our office if you have any questions or concerns [phone]   Discharge instructions    Complete by:  As directed   Please follow instructions provided by the general surgeon. Call their office for any questions or concerns.  You were cared for by a hospitalist during your hospital stay. If you have any questions about your discharge medications or the care you received while you were in the hospital after you are discharged, you can call the unit and asked to speak with the hospitalist on call if the hospitalist that took care of you is not available. Once you are discharged, your primary care physician will handle any further medical issues. Please note that NO REFILLS for any discharge medications will be authorized once you are discharged, as it is imperative that you return to your primary care physician (or establish a relationship with a primary care physician if you do not have one) for your aftercare needs so that they can reassess your need for medications and monitor your lab values. If you do not have a primary care physician, you can call [phone] for a physician referral.   Discharge wound care:    Complete by:  As directed   If you have closed incisions: Shower and bathe over these incisions with soap and water every day.  It is OK to wash over the dressings: they are waterproof. Remove all surgical dressings on postoperative day #3.  You do not need to replace dressings over the closed incisions unless you feel more comfortable with a Band-Aid covering it.   If you have an open wound: That requires packing, so please see wound care instructions.   In general, remove all dressings, wash wound with soap and water and then replace with saline moistened gauze.  Do the dressing change at least every day.    Please call our office [phone] if you have further  questions.   Driving Restrictions    Complete by:  As directed   No driving until off narcotics and can safely swerve away without pain during an emergency   Increase activity slowly    Complete by:  As directed   Increase activity slowly    Complete by:  As directed   Lifting restrictions    Complete by:  As directed   Avoid heavy lifting initially, <20 pounds at first.   Do not push through pain.   You have no specific weight limit: If it hurts to do, DON'T DO  IT.    If you feel no pain, you are not injuring anything.  Pain will protect you from injury.   Coughing and sneezing are far more stressful to your incision than any lifting.   Avoid resuming heavy lifting (>50 pounds) or other intense activity until off all narcotic pain medications.   When want to exercise more, give yourself 2 weeks to gradually get back to full intense exercise/activity.   May shower / Bathe    Complete by:  As directed   [place].  It is fine for dressings or wounds to be washed/rinsed.  Use gentle soap & water.  This will help the incisions and/or wounds get clean & minimize infection.   May walk up steps    Complete by:  As directed   Sexual Activity Restrictions    Complete by:  As directed   Sexual activity as tolerated.  Do not push through pain.  Pain will protect you from injury.   Walk with assistance    Complete by:  As directed   Walk over an hour a day.  May use a walker/cane/companion to help with balance and stamina.      ALLERGIES:  Allergies  Allergen Reactions  . Codeine Anaphylaxis  . Ibuprofen Anaphylaxis  . Robaxin [Methocarbamol] Anaphylaxis, Hives and Swelling     Discharge Medication List as of 12/29/2015 10:29 AM    START taking these medications   Details  docusate (COLACE) 50 MG/5ML liquid Take 10 mLs (100 mg total) by mouth 2 (two) times daily., Starting Fri 12/28/2015, Print    hydrALAZINE (APRESOLINE) 25 MG tablet Take 1 tablet (25 mg total) by mouth every 8 (eight)  hours. Please crush before taking, Starting Fri 12/28/2015, Print    HYDROmorphone HCl (DILAUDID) 1 MG/ML LIQD Take 1-2 mLs (1-2 mg total) by mouth every 4 (four) hours as needed for severe pain., Starting Fri 12/28/2015, Print    omeprazole (PRILOSEC) 2 mg/mL SUSP Take 10 mLs (20 mg total) by mouth 2 (two) times daily before a meal., Starting Fri 12/28/2015, Print    polyethylene glycol (MIRALAX / GLYCOLAX) packet Take 17 g by mouth every 12 (twelve) hours as needed for mild constipation, moderate constipation or severe constipation., Starting Fri 12/28/2015, Print      CONTINUE these medications which have NOT CHANGED   Details  acetaminophen (TYLENOL) 500 MG tablet Take 500 mg by mouth every 6 (six) hours as needed for mild pain., Until Discontinued, Historical Med    Multiple Vitamins-Minerals (MULTIVITAMIN WITH MINERALS) tablet Take 1 tablet by mouth daily., Until Discontinued, Historical Med    ondansetron (ZOFRAN ODT) 8 MG disintegrating tablet Take 1 tablet (8 mg total) by mouth every 8 (eight) hours as needed for nausea or vomiting., Starting Sat 12/22/2015, Print      STOP taking these medications     naproxen sodium (ALEVE) 220 MG tablet      oxyCODONE-acetaminophen (PERCOCET/ROXICET) 5-325 MG tablet      pantoprazole (PROTONIX) 40 MG tablet      sucralfate (CARAFATE) 1 g tablet        Follow-up Information    Adin Hector., MD. Daphane Shepherd on 01/15/2016.   Specialty:  General Surgery Why:  -Your first appointment for a drain check is 01/07/16 at 10am. Please arrive 30 minutes prior to your appointment to fill out necessary paperwork. -Your next follow-up is at 01/15/16 at 9:15am.  Contact information: [address] [place] [address] [phone]  [facility] Follow up on 01/07/2016.   Specialty:  General Surgery Why:  To have your drain removed & incisions re-checked Contact information: [address] [place], [place] Follow up in 1 week(s).   Specialty:  Internal Medicine Why:  for elevated blood pressure/hypertension Contact information: [address] [place] [address] [phone]           TOTAL DISCHARGE TIME: 35 mins  [facility]  [facility] Pager [phone]  12/29/2015, 12:12 PM

## 2015-12-29 NOTE — Plan of Care (Signed)
Problem: Skin Integrity: Goal: Demonstration of wound healing without infection will improve Outcome: Completed/Met Date Met: 12/29/15 Wife was taught drain care and dry dressing change, also patient and wife were taught signs and symptoms of infection.

## 2015-12-29 NOTE — Progress Notes (Addendum)
Discharge instructions gone over with patient. Home medications discussed. Prescriptions given. Follow up appointments are made. Spouse demonstrated proper technique in emptying bulb drain, and changing dressing at drain site. Diet, activity, signs and symptoms of infection were discussed with patient and spouse. Bowel regimen was discussed. Patient verbalized understanding of instructions and who to call if he has a problem. Also progressive diet information was included in patient's discharge education.

## 2015-12-29 NOTE — Progress Notes (Signed)
3 Days Post-Op  Subjective: Doing well. tol PO  Objective: Vital signs in last 24 hours: Temp:  [98.2 F (36.8 C)-98.8 F (37.1 C)] 98.8 F (37.1 C) (09/09 0500) Pulse Rate:  [74-98] 76 (09/09 0500) Resp:  [16-18] 16 (09/09 0500) BP: (155-183)/(82-113) 158/84 (09/09 0615) SpO2:  [96 %-98 %] 98 % (09/09 0500) Last BM Date: 12/22/15  Intake/Output from previous day: 09/08 0701 - 09/09 0700 In: 1698 [P.O.:1198; IV Piggyback:500] Out: 1495 [Urine:1350; Drains:145] Intake/Output this shift: No intake/output data recorded.  General appearance: alert and cooperative GI: soft, non-tender; bowel sounds normal; no masses,  no organomegaly and JP SS  Lab Results:   Recent Labs  12/27/15 0458 12/28/15 0424  WBC 10.6* 8.7  HGB 13.6 13.5  HCT 39.9 39.4  PLT 229 212   BMET  Recent Labs  12/27/15 0458 12/28/15 0424  NA 136 135  K 3.8 3.2*  CL 100* 100*  CO2 27 26  GLUCOSE 96 117*  BUN 8 6  CREATININE 0.90 0.81  CALCIUM 8.6* 8.3*   PT/INR No results for input(s): LABPROT, INR in the last 72 hours. ABG No results for input(s): PHART, HCO3 in the last 72 hours.  Invalid input(s): PCO2, PO2  Studies/Results: No results found.  Anti-infectives: Anti-infectives    Start     Dose/Rate Route Frequency Ordered Stop   12/26/15 1900  ceFAZolin (ANCEF) IVPB 2g/100 mL premix     2 g 200 mL/hr over 30 Minutes Intravenous Every 8 hours 12/26/15 1642 12/27/15 0548   12/26/15 1700  metroNIDAZOLE (FLAGYL) IVPB 500 mg     500 mg 100 mL/hr over 60 Minutes Intravenous Every 6 hours 12/26/15 1642 12/27/15 0618   12/26/15 1200  metroNIDAZOLE (FLAGYL) IVPB 500 mg     500 mg 100 mL/hr over 60 Minutes Intravenous To Surgery 12/26/15 1149 12/26/15 1147   12/26/15 0800  ceFAZolin (ANCEF) IVPB 2g/100 mL premix     2 g 200 mL/hr over 30 Minutes Intravenous To ShortStay Surgical 12/25/15 1421 12/26/15 1124      Assessment/Plan: s/p Procedure(s): LAPAROSCOPIC REPAIR OF HIATAL HERNIA  WITH MESH (N/A) OK for DC home  F/u in 1 week for drain assessment  LOS: 4 days    Rosario Jacks., Anne Hahn 12/29/2015

## 2016-01-01 DIAGNOSIS — R030 Elevated blood-pressure reading, without diagnosis of hypertension: Secondary | ICD-10-CM | POA: Diagnosis not present

## 2016-01-01 DIAGNOSIS — K449 Diaphragmatic hernia without obstruction or gangrene: Secondary | ICD-10-CM | POA: Diagnosis not present

## 2016-01-01 DIAGNOSIS — Z23 Encounter for immunization: Secondary | ICD-10-CM | POA: Diagnosis not present

## 2016-01-01 DIAGNOSIS — R1900 Intra-abdominal and pelvic swelling, mass and lump, unspecified site: Secondary | ICD-10-CM | POA: Diagnosis not present

## 2016-01-10 DIAGNOSIS — M25641 Stiffness of right hand, not elsewhere classified: Secondary | ICD-10-CM | POA: Diagnosis not present

## 2016-01-10 DIAGNOSIS — M62541 Muscle wasting and atrophy, not elsewhere classified, right hand: Secondary | ICD-10-CM | POA: Diagnosis not present

## 2016-01-10 DIAGNOSIS — M65341 Trigger finger, right ring finger: Secondary | ICD-10-CM | POA: Diagnosis not present

## 2016-01-10 DIAGNOSIS — M79644 Pain in right finger(s): Secondary | ICD-10-CM | POA: Diagnosis not present

## 2016-01-17 DIAGNOSIS — I10 Essential (primary) hypertension: Secondary | ICD-10-CM | POA: Diagnosis not present

## 2016-01-17 DIAGNOSIS — Z0000 Encounter for general adult medical examination without abnormal findings: Secondary | ICD-10-CM | POA: Diagnosis not present

## 2016-01-21 DIAGNOSIS — M25641 Stiffness of right hand, not elsewhere classified: Secondary | ICD-10-CM | POA: Diagnosis not present

## 2016-01-21 DIAGNOSIS — M62541 Muscle wasting and atrophy, not elsewhere classified, right hand: Secondary | ICD-10-CM | POA: Diagnosis not present

## 2016-01-21 DIAGNOSIS — M79644 Pain in right finger(s): Secondary | ICD-10-CM | POA: Diagnosis not present

## 2016-01-21 DIAGNOSIS — M65341 Trigger finger, right ring finger: Secondary | ICD-10-CM | POA: Diagnosis not present

## 2016-01-30 ENCOUNTER — Ambulatory Visit (INDEPENDENT_AMBULATORY_CARE_PROVIDER_SITE_OTHER): Payer: Medicare HMO | Admitting: Orthopaedic Surgery

## 2016-01-30 DIAGNOSIS — M65341 Trigger finger, right ring finger: Secondary | ICD-10-CM

## 2016-02-04 DIAGNOSIS — M25641 Stiffness of right hand, not elsewhere classified: Secondary | ICD-10-CM | POA: Diagnosis not present

## 2016-02-04 DIAGNOSIS — M79644 Pain in right finger(s): Secondary | ICD-10-CM | POA: Diagnosis not present

## 2016-02-04 DIAGNOSIS — M62541 Muscle wasting and atrophy, not elsewhere classified, right hand: Secondary | ICD-10-CM | POA: Diagnosis not present

## 2016-02-04 DIAGNOSIS — M65341 Trigger finger, right ring finger: Secondary | ICD-10-CM | POA: Diagnosis not present

## 2016-02-13 DIAGNOSIS — Z1211 Encounter for screening for malignant neoplasm of colon: Secondary | ICD-10-CM | POA: Diagnosis not present

## 2016-02-13 DIAGNOSIS — K209 Esophagitis, unspecified: Secondary | ICD-10-CM | POA: Diagnosis not present

## 2016-02-13 DIAGNOSIS — K449 Diaphragmatic hernia without obstruction or gangrene: Secondary | ICD-10-CM | POA: Diagnosis not present

## 2016-02-15 DIAGNOSIS — R030 Elevated blood-pressure reading, without diagnosis of hypertension: Secondary | ICD-10-CM | POA: Diagnosis not present

## 2016-02-15 DIAGNOSIS — R1901 Right upper quadrant abdominal swelling, mass and lump: Secondary | ICD-10-CM | POA: Diagnosis not present

## 2016-02-15 DIAGNOSIS — R634 Abnormal weight loss: Secondary | ICD-10-CM | POA: Diagnosis not present

## 2016-02-19 DIAGNOSIS — M79644 Pain in right finger(s): Secondary | ICD-10-CM | POA: Diagnosis not present

## 2016-02-19 DIAGNOSIS — M25641 Stiffness of right hand, not elsewhere classified: Secondary | ICD-10-CM | POA: Diagnosis not present

## 2016-02-19 DIAGNOSIS — M62541 Muscle wasting and atrophy, not elsewhere classified, right hand: Secondary | ICD-10-CM | POA: Diagnosis not present

## 2016-02-19 DIAGNOSIS — M65341 Trigger finger, right ring finger: Secondary | ICD-10-CM | POA: Diagnosis not present

## 2016-02-27 ENCOUNTER — Ambulatory Visit (INDEPENDENT_AMBULATORY_CARE_PROVIDER_SITE_OTHER): Payer: Medicare HMO | Admitting: Orthopaedic Surgery

## 2016-02-27 DIAGNOSIS — M79641 Pain in right hand: Secondary | ICD-10-CM

## 2016-02-27 NOTE — Progress Notes (Signed)
William Avila is following up at almost 3 months status post a right middle finger trigger release. He has stiffness in his hands and finger overall but no triggering at this point. On examination there is no triggering. He has pain over the surgical site but otherwise good motion.  At this point I'll follow up as needed. I did give him some samples of an topical anti-inflammatory to see if this could help with his daily aches and pains.

## 2016-03-04 ENCOUNTER — Telehealth (INDEPENDENT_AMBULATORY_CARE_PROVIDER_SITE_OTHER): Payer: Self-pay | Admitting: *Deleted

## 2016-03-04 NOTE — Telephone Encounter (Signed)
Hailey called regarding the plan of care she faxed last week, she is re faxing this just in case. [phone]

## 2016-03-10 DIAGNOSIS — A09 Infectious gastroenteritis and colitis, unspecified: Secondary | ICD-10-CM | POA: Diagnosis not present

## 2016-03-11 DIAGNOSIS — A09 Infectious gastroenteritis and colitis, unspecified: Secondary | ICD-10-CM | POA: Diagnosis not present

## 2016-03-19 DIAGNOSIS — R143 Flatulence: Secondary | ICD-10-CM | POA: Diagnosis not present

## 2016-03-19 DIAGNOSIS — A084 Viral intestinal infection, unspecified: Secondary | ICD-10-CM | POA: Diagnosis not present

## 2016-05-14 DIAGNOSIS — R634 Abnormal weight loss: Secondary | ICD-10-CM | POA: Diagnosis not present

## 2016-05-14 DIAGNOSIS — Z1389 Encounter for screening for other disorder: Secondary | ICD-10-CM | POA: Diagnosis not present

## 2016-05-14 DIAGNOSIS — Z0000 Encounter for general adult medical examination without abnormal findings: Secondary | ICD-10-CM | POA: Diagnosis not present

## 2016-06-11 DIAGNOSIS — K210 Gastro-esophageal reflux disease with esophagitis: Secondary | ICD-10-CM | POA: Diagnosis not present

## 2016-06-11 DIAGNOSIS — K621 Rectal polyp: Secondary | ICD-10-CM | POA: Diagnosis not present

## 2016-06-11 DIAGNOSIS — K228 Other specified diseases of esophagus: Secondary | ICD-10-CM | POA: Diagnosis not present

## 2016-06-11 DIAGNOSIS — K5730 Diverticulosis of large intestine without perforation or abscess without bleeding: Secondary | ICD-10-CM | POA: Diagnosis not present

## 2016-06-11 DIAGNOSIS — Z1211 Encounter for screening for malignant neoplasm of colon: Secondary | ICD-10-CM | POA: Diagnosis not present

## 2016-06-17 DIAGNOSIS — K621 Rectal polyp: Secondary | ICD-10-CM | POA: Diagnosis not present

## 2016-06-17 DIAGNOSIS — K210 Gastro-esophageal reflux disease with esophagitis: Secondary | ICD-10-CM | POA: Diagnosis not present

## 2016-06-17 DIAGNOSIS — Z1211 Encounter for screening for malignant neoplasm of colon: Secondary | ICD-10-CM | POA: Diagnosis not present

## 2016-10-27 DIAGNOSIS — Z6823 Body mass index (BMI) 23.0-23.9, adult: Secondary | ICD-10-CM | POA: Diagnosis not present

## 2016-10-27 DIAGNOSIS — M13841 Other specified arthritis, right hand: Secondary | ICD-10-CM | POA: Diagnosis not present

## 2016-10-27 DIAGNOSIS — M13842 Other specified arthritis, left hand: Secondary | ICD-10-CM | POA: Diagnosis not present

## 2016-10-27 DIAGNOSIS — Z0000 Encounter for general adult medical examination without abnormal findings: Secondary | ICD-10-CM | POA: Diagnosis not present

## 2016-10-27 DIAGNOSIS — H9313 Tinnitus, bilateral: Secondary | ICD-10-CM | POA: Diagnosis not present

## 2016-10-27 DIAGNOSIS — K449 Diaphragmatic hernia without obstruction or gangrene: Secondary | ICD-10-CM | POA: Diagnosis not present

## 2016-11-06 ENCOUNTER — Telehealth (INDEPENDENT_AMBULATORY_CARE_PROVIDER_SITE_OTHER): Payer: Self-pay | Admitting: Orthopaedic Surgery

## 2016-11-06 NOTE — Telephone Encounter (Signed)
[place] records 09/2015- 12/2015 faxed to [facility]

## 2016-11-26 DIAGNOSIS — R634 Abnormal weight loss: Secondary | ICD-10-CM | POA: Diagnosis not present

## 2016-11-26 DIAGNOSIS — Z6823 Body mass index (BMI) 23.0-23.9, adult: Secondary | ICD-10-CM | POA: Diagnosis not present

## 2016-12-04 DIAGNOSIS — M1812 Unilateral primary osteoarthritis of first carpometacarpal joint, left hand: Secondary | ICD-10-CM | POA: Diagnosis not present

## 2016-12-30 DIAGNOSIS — M1812 Unilateral primary osteoarthritis of first carpometacarpal joint, left hand: Secondary | ICD-10-CM | POA: Diagnosis not present

## 2016-12-30 DIAGNOSIS — M65341 Trigger finger, right ring finger: Secondary | ICD-10-CM | POA: Diagnosis not present

## 2017-01-28 DIAGNOSIS — Z23 Encounter for immunization: Secondary | ICD-10-CM | POA: Diagnosis not present

## 2017-02-10 DIAGNOSIS — M65341 Trigger finger, right ring finger: Secondary | ICD-10-CM | POA: Diagnosis not present

## 2017-02-10 DIAGNOSIS — M1812 Unilateral primary osteoarthritis of first carpometacarpal joint, left hand: Secondary | ICD-10-CM | POA: Diagnosis not present

## 2017-03-24 DIAGNOSIS — M1812 Unilateral primary osteoarthritis of first carpometacarpal joint, left hand: Secondary | ICD-10-CM | POA: Diagnosis not present

## 2017-05-27 DIAGNOSIS — Z1389 Encounter for screening for other disorder: Secondary | ICD-10-CM | POA: Diagnosis not present

## 2017-05-27 DIAGNOSIS — M19032 Primary osteoarthritis, left wrist: Secondary | ICD-10-CM | POA: Diagnosis not present

## 2017-05-27 DIAGNOSIS — Z0000 Encounter for general adult medical examination without abnormal findings: Secondary | ICD-10-CM | POA: Diagnosis not present

## 2017-05-27 DIAGNOSIS — N529 Male erectile dysfunction, unspecified: Secondary | ICD-10-CM | POA: Diagnosis not present

## 2017-05-27 DIAGNOSIS — M4802 Spinal stenosis, cervical region: Secondary | ICD-10-CM | POA: Diagnosis not present

## 2017-09-25 DIAGNOSIS — W57XXXA Bitten or stung by nonvenomous insect and other nonvenomous arthropods, initial encounter: Secondary | ICD-10-CM | POA: Diagnosis not present

## 2017-09-25 DIAGNOSIS — S30860A Insect bite (nonvenomous) of lower back and pelvis, initial encounter: Secondary | ICD-10-CM | POA: Diagnosis not present

## 2017-09-26 DIAGNOSIS — Z888 Allergy status to other drugs, medicaments and biological substances status: Secondary | ICD-10-CM | POA: Diagnosis not present

## 2017-09-26 DIAGNOSIS — Z886 Allergy status to analgesic agent status: Secondary | ICD-10-CM | POA: Diagnosis not present

## 2017-09-26 DIAGNOSIS — Z87892 Personal history of anaphylaxis: Secondary | ICD-10-CM | POA: Diagnosis not present

## 2017-09-26 DIAGNOSIS — Z823 Family history of stroke: Secondary | ICD-10-CM | POA: Diagnosis not present

## 2017-09-26 DIAGNOSIS — A6920 Lyme disease, unspecified: Secondary | ICD-10-CM | POA: Diagnosis not present

## 2017-09-26 DIAGNOSIS — R030 Elevated blood-pressure reading, without diagnosis of hypertension: Secondary | ICD-10-CM | POA: Diagnosis not present

## 2017-09-26 DIAGNOSIS — Z885 Allergy status to narcotic agent status: Secondary | ICD-10-CM | POA: Diagnosis not present

## 2017-09-26 DIAGNOSIS — Z8249 Family history of ischemic heart disease and other diseases of the circulatory system: Secondary | ICD-10-CM | POA: Diagnosis not present

## 2017-10-07 DIAGNOSIS — H43811 Vitreous degeneration, right eye: Secondary | ICD-10-CM | POA: Diagnosis not present

## 2017-10-07 DIAGNOSIS — H25013 Cortical age-related cataract, bilateral: Secondary | ICD-10-CM | POA: Diagnosis not present

## 2017-11-05 DIAGNOSIS — M722 Plantar fascial fibromatosis: Secondary | ICD-10-CM | POA: Diagnosis not present

## 2017-11-05 DIAGNOSIS — M79671 Pain in right foot: Secondary | ICD-10-CM | POA: Diagnosis not present

## 2017-11-05 DIAGNOSIS — M2021 Hallux rigidus, right foot: Secondary | ICD-10-CM | POA: Diagnosis not present

## 2017-11-25 DIAGNOSIS — M79671 Pain in right foot: Secondary | ICD-10-CM | POA: Diagnosis not present

## 2017-11-25 DIAGNOSIS — M2021 Hallux rigidus, right foot: Secondary | ICD-10-CM | POA: Diagnosis not present

## 2017-11-25 DIAGNOSIS — M722 Plantar fascial fibromatosis: Secondary | ICD-10-CM | POA: Diagnosis not present

## 2017-12-24 DIAGNOSIS — M79671 Pain in right foot: Secondary | ICD-10-CM | POA: Diagnosis not present

## 2017-12-24 DIAGNOSIS — M722 Plantar fascial fibromatosis: Secondary | ICD-10-CM | POA: Diagnosis not present

## 2017-12-24 DIAGNOSIS — M2021 Hallux rigidus, right foot: Secondary | ICD-10-CM | POA: Diagnosis not present

## 2017-12-24 DIAGNOSIS — M25571 Pain in right ankle and joints of right foot: Secondary | ICD-10-CM | POA: Diagnosis not present

## 2018-01-13 DIAGNOSIS — Z23 Encounter for immunization: Secondary | ICD-10-CM | POA: Diagnosis not present

## 2018-01-20 DIAGNOSIS — M2021 Hallux rigidus, right foot: Secondary | ICD-10-CM | POA: Diagnosis not present

## 2018-01-20 DIAGNOSIS — R2689 Other abnormalities of gait and mobility: Secondary | ICD-10-CM | POA: Diagnosis not present

## 2018-01-20 DIAGNOSIS — M25571 Pain in right ankle and joints of right foot: Secondary | ICD-10-CM | POA: Diagnosis not present

## 2018-03-03 DIAGNOSIS — M2021 Hallux rigidus, right foot: Secondary | ICD-10-CM | POA: Diagnosis not present

## 2018-03-03 DIAGNOSIS — M25571 Pain in right ankle and joints of right foot: Secondary | ICD-10-CM | POA: Diagnosis not present

## 2018-03-03 DIAGNOSIS — R2689 Other abnormalities of gait and mobility: Secondary | ICD-10-CM | POA: Diagnosis not present

## 2018-03-10 DIAGNOSIS — H04123 Dry eye syndrome of bilateral lacrimal glands: Secondary | ICD-10-CM | POA: Diagnosis not present

## 2018-03-16 DIAGNOSIS — M79602 Pain in left arm: Secondary | ICD-10-CM | POA: Diagnosis not present

## 2018-06-02 ENCOUNTER — Other Ambulatory Visit: Payer: Self-pay | Admitting: Internal Medicine

## 2018-06-02 ENCOUNTER — Other Ambulatory Visit (HOSPITAL_COMMUNITY): Payer: Self-pay | Admitting: Internal Medicine

## 2018-06-02 DIAGNOSIS — R072 Precordial pain: Secondary | ICD-10-CM

## 2018-06-02 DIAGNOSIS — Z1159 Encounter for screening for other viral diseases: Secondary | ICD-10-CM | POA: Diagnosis not present

## 2018-06-02 DIAGNOSIS — Z0000 Encounter for general adult medical examination without abnormal findings: Secondary | ICD-10-CM | POA: Diagnosis not present

## 2018-06-02 DIAGNOSIS — Z13220 Encounter for screening for lipoid disorders: Secondary | ICD-10-CM | POA: Diagnosis not present

## 2018-06-02 DIAGNOSIS — N529 Male erectile dysfunction, unspecified: Secondary | ICD-10-CM | POA: Diagnosis not present

## 2018-06-02 DIAGNOSIS — Z1389 Encounter for screening for other disorder: Secondary | ICD-10-CM | POA: Diagnosis not present

## 2018-06-07 ENCOUNTER — Telehealth (HOSPITAL_COMMUNITY): Payer: Self-pay

## 2018-06-07 ENCOUNTER — Telehealth (HOSPITAL_COMMUNITY): Payer: Self-pay | Admitting: *Deleted

## 2018-06-07 NOTE — Telephone Encounter (Signed)
Patient given detailed instructions per Myocardial Perfusion Study Information Sheet for the test on 06/09/2018 at 1000. Patient notified to arrive 15 minutes early and that it is imperative to arrive on time for appointment to keep from having the test rescheduled.  If you need to cancel or reschedule your appointment, please call the office within 24 hours of your appointment. . Patient verbalized understanding.TMY

## 2018-06-07 NOTE — Telephone Encounter (Signed)
Left message on voicemail in reference to upcoming appointment scheduled for 06/09/18 Phone number given for a call back so details instructions can be given.  William Avila

## 2018-06-09 ENCOUNTER — Ambulatory Visit (HOSPITAL_COMMUNITY): Payer: Medicare HMO | Attending: Internal Medicine

## 2018-06-09 DIAGNOSIS — R072 Precordial pain: Secondary | ICD-10-CM | POA: Diagnosis not present

## 2018-06-09 LAB — MYOCARDIAL PERFUSION IMAGING
LV dias vol: 129 mL (ref 62–150)
LV sys vol: 49 mL
Peak HR: 69 {beats}/min
Rest HR: 49 {beats}/min
SDS: 0
SRS: 2
SSS: 2
TID: 1.08

## 2018-06-09 MED ORDER — TECHNETIUM TC 99M TETROFOSMIN IV KIT
31.2000 | PACK | Freq: Once | INTRAVENOUS | Status: AC | PRN
  Administered 2018-06-09: 31.2 via INTRAVENOUS
  Filled 2018-06-09: qty 32

## 2018-06-09 MED ORDER — REGADENOSON 0.4 MG/5ML IV SOLN
0.4000 mg | Freq: Once | INTRAVENOUS | Status: AC
  Administered 2018-06-09: 0.4 mg via INTRAVENOUS

## 2018-06-09 MED ORDER — TECHNETIUM TC 99M TETROFOSMIN IV KIT
10.4000 | PACK | Freq: Once | INTRAVENOUS | Status: AC | PRN
  Administered 2018-06-09: 10.4 via INTRAVENOUS
  Filled 2018-06-09: qty 11

## 2018-08-30 DIAGNOSIS — Z8249 Family history of ischemic heart disease and other diseases of the circulatory system: Secondary | ICD-10-CM | POA: Diagnosis not present

## 2018-08-30 DIAGNOSIS — Z823 Family history of stroke: Secondary | ICD-10-CM | POA: Diagnosis not present

## 2018-08-30 DIAGNOSIS — Z885 Allergy status to narcotic agent status: Secondary | ICD-10-CM | POA: Diagnosis not present

## 2019-01-05 ENCOUNTER — Emergency Department (HOSPITAL_COMMUNITY): Payer: Medicare HMO

## 2019-01-05 ENCOUNTER — Emergency Department (HOSPITAL_COMMUNITY)
Admission: EM | Admit: 2019-01-05 | Discharge: 2019-01-05 | Disposition: A | Discharge status: Home / Self Care | Payer: Medicare HMO | Attending: Emergency Medicine | Admitting: Emergency Medicine

## 2019-01-05 ENCOUNTER — Other Ambulatory Visit: Payer: Self-pay

## 2019-01-05 DIAGNOSIS — R110 Nausea: Secondary | ICD-10-CM | POA: Insufficient documentation

## 2019-01-05 DIAGNOSIS — R42 Dizziness and giddiness: Secondary | ICD-10-CM | POA: Insufficient documentation

## 2019-01-05 DIAGNOSIS — R51 Headache: Secondary | ICD-10-CM | POA: Diagnosis not present

## 2019-01-05 DIAGNOSIS — Z5321 Procedure and treatment not carried out due to patient leaving prior to being seen by health care provider: Secondary | ICD-10-CM | POA: Diagnosis not present

## 2019-01-05 LAB — CBC
HCT: 46 % (ref 39.0–52.0)
Hemoglobin: 16.3 g/dL (ref 13.0–17.0)
MCH: 33.9 pg (ref 26.0–34.0)
MCHC: 35.4 g/dL (ref 30.0–36.0)
MCV: 95.6 fL (ref 80.0–100.0)
Platelets: 238 10*3/uL (ref 150–400)
RBC: 4.81 MIL/uL (ref 4.22–5.81)
RDW: 12.4 % (ref 11.5–15.5)
WBC: 7.7 10*3/uL (ref 4.0–10.5)
nRBC: 0 % (ref 0.0–0.2)

## 2019-01-05 LAB — BASIC METABOLIC PANEL
Anion gap: 8 (ref 5–15)
BUN: 10 mg/dL (ref 8–23)
CO2: 28 mmol/L (ref 22–32)
Calcium: 9.4 mg/dL (ref 8.9–10.3)
Chloride: 102 mmol/L (ref 98–111)
Creatinine, Ser: 1.01 mg/dL (ref 0.61–1.24)
GFR calc Af Amer: >60 mL/min (ref >60)
GFR calc non Af Amer: >60 mL/min (ref >60)
Glucose, Bld: 105 mg/dL — ABNORMAL HIGH (ref 70–99)
Potassium: 3.3 mmol/L — ABNORMAL LOW (ref 3.5–5.1)
Sodium: 138 mmol/L (ref 135–145)

## 2019-01-05 MED ORDER — SODIUM CHLORIDE 0.9% FLUSH: 3.0000 mL | Freq: Once | INTRAVENOUS | Status: DC

## 2019-01-05 NOTE — ED Triage Notes (Addendum)
Pt presents with acute onset headache, dizziness and nausea starting at 0200. PCP prescribed him Meclizine with some relief. No neuro deficits noted in triage . PCP requesting a head CT

## 2019-02-01 DIAGNOSIS — R69 Illness, unspecified: Secondary | ICD-10-CM | POA: Diagnosis not present

## 2019-04-25 ENCOUNTER — Ambulatory Visit: Payer: Medicare HMO | Admitting: Orthopaedic Surgery

## 2019-04-25 ENCOUNTER — Encounter: Payer: Self-pay | Admitting: Orthopaedic Surgery

## 2019-04-25 ENCOUNTER — Other Ambulatory Visit: Payer: Self-pay

## 2019-04-25 DIAGNOSIS — M1812 Unilateral primary osteoarthritis of first carpometacarpal joint, left hand: Secondary | ICD-10-CM

## 2019-04-25 NOTE — Progress Notes (Signed)
Office Visit Note   Patient: William Avila           Date of Birth: [birth year]           MRN: [id number] Visit Date: 04/25/2019              Requested by: [name], MD [address] [place],  [place] [address] PCP: [name], MD   Assessment & Plan: Visit Diagnoses:  1. Primary osteoarthritis of first carpometacarpal joint of left hand     Plan: Given his significant basilar thumb joint arthritis on the left side combined with some type of malalignment or subluxation of the EPL tendon on the left side, I would like to send him to Dr. Charlotte Crumb a hand specialist here in town for further evaluation and treatment of these issues.  The patient agrees with this referral as well.  We will work on making the referral.  Follow-Up Instructions: Return if symptoms worsen or fail to improve.   Orders:  No orders of the defined types were placed in this encounter.  No orders of the defined types were placed in this encounter.     Procedures: No procedures performed   Clinical Data: No additional findings.   Subjective: Chief Complaint  Patient presents with  . Left Hand - Pain  Patient is a very pleasant 74 year old gentleman that I have actually seen remotely.  He comes in today for evaluation treatment of basilar thumb joint arthritis of his left thumb.  We saw him for this years ago.  He says it hurts on a daily basis especially with gripping and twisting activities the pain is bothering him quite a bit.  He also reports a snapping or popping sensation and he points to the dorsal aspect of his thumb where that sensation occurs.  HPI  Review of Systems He currently denies any headache, chest pain, shortness of breath, fever, chills, nausea, vomiting  Objective: Vital Signs: There were no vitals taken for this visit.  Physical Exam He is alert and orient x3 and in no acute distress Ortho Exam Examination of his left hand does show  significant pain at the basilar thumb joint of the left side.  There is a positive grind test in that area as well.  He does have an interesting finding with his EPL tendon.  It seems to be subluxing and popping at the MCP joint.  He does have normal flexion and extension of the thumb.  There is no triggering.  There is no muscle atrophy either.  There is a well-healed scar over the MCP joint that the patient states was from when he had some type of mass removed remotely.  He said that was a very long time ago. Specialty Comments:  No specialty comments available.  Imaging: No results found.   PMFS History: Patient Active Problem List   Diagnosis Date Noted  . Primary osteoarthritis of first carpometacarpal joint of left hand 04/25/2019  . Incarcerated hiatal hernia s/p lap repair 12/26/2015 12/27/2015  . Epigastric pain 12/23/2015  . Dehydration 12/23/2015  . Protein-calorie malnutrition, moderate ([place]) 12/23/2015  . Leukocytosis 12/23/2015  . Retroperitoneal mass-large calcified mass, present since 2015 12/23/2015   Past Medical History:  Diagnosis Date  . Acute appendicitis 01/21/2014  . Arthritis   . History of hiatal hernia     History reviewed. No pertinent family history.  Past Surgical History:  Procedure Laterality Date  . BACK SURGERY  and neck and trigger finger surgery  . CERVICAL SPINE SURGERY    . ESOPHAGOGASTRODUODENOSCOPY (EGD) WITH PROPOFOL Left 12/24/2015   Procedure: ESOPHAGOGASTRODUODENOSCOPY (EGD) WITH PROPOFOL;  Surgeon: Otis Brace, MD;  Location: [facility];  Service: Gastroenterology;  Laterality: Left;  . HIATAL HERNIA REPAIR N/A 12/26/2015   Procedure: LAPAROSCOPIC REPAIR OF HIATAL HERNIA WITH MESH;  Surgeon: Michael Boston, MD;  Location: [facility];  Service: General;  Laterality: N/A;  . LAPAROSCOPIC APPENDECTOMY N/A 01/21/2014   Procedure: APPENDECTOMY LAPAROSCOPIC;  Surgeon: Autumn Messing III, MD;  Location: [facility];  Service: General;  Laterality: N/A;  .  TRIGGER FINGER RELEASE     Social History   Occupational History  . Not on file  Tobacco Use  . Smoking status: Former Smoker    Types: Cigarettes  . Smokeless tobacco: Never Used  Substance and Sexual Activity  . Alcohol use: Yes    Comment: Occassionally  . Drug use: No  . Sexual activity: Not on file

## 2019-05-12 DIAGNOSIS — M79642 Pain in left hand: Secondary | ICD-10-CM | POA: Diagnosis not present

## 2019-05-12 DIAGNOSIS — M66242 Spontaneous rupture of extensor tendons, left hand: Secondary | ICD-10-CM | POA: Diagnosis not present

## 2019-05-12 DIAGNOSIS — M654 Radial styloid tenosynovitis [de Quervain]: Secondary | ICD-10-CM | POA: Diagnosis not present

## 2019-05-12 DIAGNOSIS — M1812 Unilateral primary osteoarthritis of first carpometacarpal joint, left hand: Secondary | ICD-10-CM | POA: Diagnosis not present

## 2019-05-23 HISTORY — PX: TRIGGER FINGER RELEASE: SHX641

## 2019-05-30 ENCOUNTER — Other Ambulatory Visit: Payer: Self-pay

## 2019-05-30 ENCOUNTER — Emergency Department (HOSPITAL_COMMUNITY)
Admission: EM | Admit: 2019-05-30 | Discharge: 2019-05-30 | Disposition: A | Discharge status: Home / Self Care | Payer: Medicare HMO | Attending: Emergency Medicine | Admitting: Emergency Medicine

## 2019-05-30 ENCOUNTER — Other Ambulatory Visit: Payer: Self-pay | Admitting: Cardiology

## 2019-05-30 ENCOUNTER — Emergency Department (HOSPITAL_COMMUNITY): Payer: Medicare HMO

## 2019-05-30 DIAGNOSIS — S66292A Other specified injury of extensor muscle, fascia and tendon of left thumb at wrist and hand level, initial encounter: Secondary | ICD-10-CM | POA: Diagnosis not present

## 2019-05-30 DIAGNOSIS — R110 Nausea: Secondary | ICD-10-CM | POA: Diagnosis not present

## 2019-05-30 DIAGNOSIS — Z87891 Personal history of nicotine dependence: Secondary | ICD-10-CM | POA: Diagnosis not present

## 2019-05-30 DIAGNOSIS — Z79899 Other long term (current) drug therapy: Secondary | ICD-10-CM | POA: Diagnosis not present

## 2019-05-30 DIAGNOSIS — I471 Supraventricular tachycardia: Secondary | ICD-10-CM

## 2019-05-30 DIAGNOSIS — R0902 Hypoxemia: Secondary | ICD-10-CM | POA: Diagnosis not present

## 2019-05-30 DIAGNOSIS — M1812 Unilateral primary osteoarthritis of first carpometacarpal joint, left hand: Secondary | ICD-10-CM | POA: Diagnosis not present

## 2019-05-30 DIAGNOSIS — I10 Essential (primary) hypertension: Secondary | ICD-10-CM | POA: Diagnosis not present

## 2019-05-30 DIAGNOSIS — M654 Radial styloid tenosynovitis [de Quervain]: Secondary | ICD-10-CM | POA: Diagnosis not present

## 2019-05-30 DIAGNOSIS — R000 Tachycardia, unspecified: Secondary | ICD-10-CM | POA: Diagnosis present

## 2019-05-30 LAB — CBG MONITORING, ED: Glucose-Capillary: 133 mg/dL — ABNORMAL HIGH (ref 70–99)

## 2019-05-30 LAB — MAGNESIUM: Magnesium: 1.7 mg/dL (ref 1.7–2.4)

## 2019-05-30 LAB — BASIC METABOLIC PANEL
Anion gap: 16 — ABNORMAL HIGH (ref 5–15)
BUN: 12 mg/dL (ref 8–23)
CO2: 22 mmol/L (ref 22–32)
Calcium: 9 mg/dL (ref 8.9–10.3)
Chloride: 98 mmol/L (ref 98–111)
Creatinine, Ser: 0.93 mg/dL (ref 0.61–1.24)
GFR calc Af Amer: >60 mL/min (ref >60)
GFR calc non Af Amer: >60 mL/min (ref >60)
Glucose, Bld: 155 mg/dL — ABNORMAL HIGH (ref 70–99)
Potassium: 3.5 mmol/L (ref 3.5–5.1)
Sodium: 136 mmol/L (ref 135–145)

## 2019-05-30 LAB — CBC
HCT: 47.1 % (ref 39.0–52.0)
Hemoglobin: 16.1 g/dL (ref 13.0–17.0)
MCH: 32.9 pg (ref 26.0–34.0)
MCHC: 34.2 g/dL (ref 30.0–36.0)
MCV: 96.3 fL (ref 80.0–100.0)
Platelets: 265 10*3/uL (ref 150–400)
RBC: 4.89 MIL/uL (ref 4.22–5.81)
RDW: 12.6 % (ref 11.5–15.5)
WBC: 9.6 10*3/uL (ref 4.0–10.5)
nRBC: 0 % (ref 0.0–0.2)

## 2019-05-30 LAB — TSH: TSH: 0.584 u[IU]/mL (ref 0.350–4.500)

## 2019-05-30 LAB — TROPONIN I (HIGH SENSITIVITY)
Troponin I (High Sensitivity): 5 ng/L (ref <18)
Troponin I (High Sensitivity): 12 ng/L (ref <18)

## 2019-05-30 MED ORDER — METOCLOPRAMIDE HCL 5 MG/ML IJ SOLN
10.0000 mg | Freq: Once | INTRAMUSCULAR | Status: AC
  Administered 2019-05-30: 10 mg via INTRAVENOUS
  Filled 2019-05-30: qty 2

## 2019-05-30 MED ORDER — SODIUM CHLORIDE 0.9% FLUSH
3.0000 mL | Freq: Once | INTRAVENOUS | Status: AC
  Administered 2019-05-30: 3 mL via INTRAVENOUS

## 2019-05-30 MED ORDER — METOPROLOL TARTRATE 5 MG/5ML IV SOLN
5.0000 mg | Freq: Once | INTRAVENOUS | Status: AC
  Administered 2019-05-30: 5 mg via INTRAVENOUS
  Filled 2019-05-30: qty 5

## 2019-05-30 MED ORDER — ONDANSETRON HCL 4 MG/2ML IJ SOLN
4.0000 mg | Freq: Once | INTRAMUSCULAR | Status: AC
  Administered 2019-05-30: 4 mg via INTRAVENOUS
  Filled 2019-05-30: qty 2

## 2019-05-30 MED ORDER — METOPROLOL TARTRATE 25 MG PO TABS: 12.5000 mg | ORAL_TABLET | Freq: Two times a day (BID) | ORAL | 1 refills | Status: DC

## 2019-05-30 MED ORDER — OXYCODONE-ACETAMINOPHEN 5-325 MG PO TABS
1.0000 | ORAL_TABLET | Freq: Once | ORAL | Status: AC
  Administered 2019-05-30: 1 via ORAL
  Filled 2019-05-30: qty 1

## 2019-05-30 MED ORDER — POTASSIUM CHLORIDE CRYS ER 20 MEQ PO TBCR
40.0000 meq | EXTENDED_RELEASE_TABLET | Freq: Once | ORAL | Status: AC
  Administered 2019-05-30: 40 meq via ORAL
  Filled 2019-05-30: qty 2

## 2019-05-30 MED ORDER — METOPROLOL TARTRATE 25 MG PO TABS
12.5000 mg | ORAL_TABLET | Freq: Once | ORAL | Status: AC
  Administered 2019-05-30: 12.5 mg via ORAL
  Filled 2019-05-30: qty 1

## 2019-05-30 NOTE — ED Notes (Signed)
Discharge instructions discussed with pt. Pt verbalized understanding with no questions at this time. Pt ambulatory to go home with wife.

## 2019-05-30 NOTE — ED Notes (Signed)
Patient wife calling asking for a call back needs and update on patient  Please call [phone]  William Avila

## 2019-05-30 NOTE — ED Notes (Signed)
Pt has been able to tolerate solids and liquids. Denies nausea.   Pt ambulated in the hall.VS after ambulation: BP 159/141 RR 20 SpO2 97 HR 65 Pt gait stable.   Dr. Eulis Foster informed and at bedside

## 2019-05-30 NOTE — ED Triage Notes (Signed)
Pt here from the [facility] after surgery on his L thumb. Pt in and out of SVT to 156 in recovery. Pt nauseas, given zofran 4mg  x 2. Pt denies cp, shob.

## 2019-05-30 NOTE — ED Notes (Signed)
Wife, Hoyle Sauer, to be updated. [phone]

## 2019-05-30 NOTE — ED Provider Notes (Addendum)
6:15 PM-cardiology has seen the patient, but did not contact me directly.  At this time the patient is tachycardic to 140, and is standing up urinating into a urinal.  He has had significant tachycardia since 1700 hrs.  He has not responded to the metoprolol which was given, at 1637.  Additional laboratory testing ordered, has returned, magnesium normal, TSH normal, potassium is normal.  7:25 PM-he had transient improvement of heart rate after IV Lopressor.  About 5 minutes ago he became nauseated and had some dry heaving, causing his heart rate to go up.  He has been persistently hypertensive for a couple hours.  He states he does not take blood pressure medications.  He has not had anything to eat yet today, and would like to try something after getting some Zofran.  Current plan will be to ambulate him after he eats and assess vitals, for possible disposition home.   Patient Vitals for the past 24 hrs:  BP Temp Temp src Pulse Resp SpO2  05/30/19 2045 (!) 159/141 -- -- 65 20 97 %  05/30/19 2015 (!) 155/117 -- -- 93 16 94 %  05/30/19 1945 (!) 149/130 -- -- (!) 58 18 94 %  05/30/19 1930 (!) 141/127 -- -- 68 17 97 %  05/30/19 1846 (!) 163/137 -- -- (!) 117 19 93 %  05/30/19 1837 (!) 185/126 -- -- 84 (!) 25 100 %  05/30/19 1835 (!) 184/126 -- -- 64 13 97 %  05/30/19 1815 (!) 176/129 -- -- 87 (!) 23 95 %  05/30/19 1800 (!) 161/130 -- -- (!) 58 17 96 %  05/30/19 1745 (!) 175/129 -- -- 68 17 93 %  05/30/19 1730 (!) 194/139 -- -- (!) 48 (!) 33 92 %  05/30/19 1715 (!) 178/116 -- -- (!) 131 (!) 21 95 %  05/30/19 1700 (!) 166/108 -- -- (!) 138 (!) 22 95 %  05/30/19 1637 (!) 148/106 -- -- 64 18 96 %  05/30/19 1500 (!) 144/94 -- -- (!) 46 15 96 %  05/30/19 1445 (!) 115/99 -- -- (!) 125 11 97 %  05/30/19 1430 (!) 138/91 -- -- (!) 52 15 97 %  05/30/19 1415 (!) 144/97 -- -- (!) 51 14 96 %  05/30/19 1400 (!) 160/96 -- -- (!) 53 14 98 %  05/30/19 1345 (!) 162/98 -- -- (!) 54 15 97 %  05/30/19 1330 (!)  162/105 -- -- (!) 56 17 95 %  05/30/19 1315 (!) 171/126 -- -- -- 16 --  05/30/19 1300 (!) 164/114 -- -- (!) 42 (!) 27 96 %  05/30/19 1245 (!) 172/102 -- -- (!) 53 17 95 %  05/30/19 1236 -- -- -- (!) 53 (!) 31 98 %  05/30/19 1235 -- -- -- (!) 52 13 97 %  05/30/19 1234 -- -- -- (!) 52 16 98 %  05/30/19 1233 -- -- -- (!) 139 20 95 %  05/30/19 1232 -- -- -- -- 12 --  05/30/19 1231 (!) 181/124 -- -- -- 11 --  05/30/19 1230 -- -- -- -- 11 --  05/30/19 1147 (!) 174/125 97.7 F (36.5 C) Oral 60 16 97 %   Medications  sodium chloride flush (NS) 0.9 % injection 3 mL (3 mLs Intravenous Given 05/30/19 1202)  metoCLOPramide (REGLAN) injection 10 mg (10 mg Intravenous Given 05/30/19 1202)  oxyCODONE-acetaminophen (PERCOCET/ROXICET) 5-325 MG per tablet 1 tablet (1 tablet Oral Given 05/30/19 1557)  potassium chloride SA (KLOR-CON) CR tablet 40 mEq (  40 mEq Oral Given 05/30/19 1636)  metoprolol tartrate (LOPRESSOR) tablet 12.5 mg (12.5 mg Oral Given 05/30/19 1637)  metoprolol tartrate (LOPRESSOR) injection 5 mg (5 mg Intravenous Given 05/30/19 1829)  ondansetron (ZOFRAN) injection 4 mg (4 mg Intravenous Given 05/30/19 1927)   8:54 PM Reevaluation with update and discussion. After initial assessment and treatment, an updated evaluation reveals patient was able to eat after Zofran, and was able to walk without dizziness or weakness.  As I entered the room he was an sinus rhythm on the heart monitor, rate 56 without evident flutter waves.  Findings discussed with the patient and all questions were answered. Daleen Bo   Medical Decision Making: Nausea and tachycardia following surgical procedure left arm with shoulder block.  Patient stabilized after being treated with IV fluids, oral and IV metoprolol.  Follow-up set with cardiology, in about a month, patient referred to PCP for hypertension, and prescription sent for metoprolol.  The EKG done at 9:07 PM is sinus rhythm, rate 61.  No clear flutter waves are seen.   Possible U wave present.  TSH, potassium and magnesium were normal.  Patient stable for discharge.  William Avila was evaluated in Emergency Department on 05/30/2019 for the symptoms described in the history of present illness. He was evaluated in the context of the global COVID-19 pandemic, which necessitated consideration that the patient might be at risk for infection with the SARS-CoV-2 virus that causes COVID-19. Institutional protocols and algorithms that pertain to the evaluation of patients at risk for COVID-19 are in a state of rapid change based on information released by regulatory bodies including the CDC and federal and state organizations. These policies and algorithms were followed during the patient's care in the ED.  CRITICAL CARE- yes Performed by: Daleen Bo  .Critical Care Performed by: Daleen Bo, MD Authorized by: Daleen Bo, MD   Critical care provider statement:    Critical care time (minutes):  35   Critical care start time:  05/30/2019 4:20 PM   Critical care end time:  05/30/2019 9:07 PM   Critical care time was exclusive of:  Separately billable procedures and treating other patients   Critical care was necessary to treat or prevent imminent or life-threatening deterioration of the following conditions:  Circulatory failure   Critical care was time spent personally by me on the following activities:  Blood draw for specimens, development of treatment plan with patient or surrogate, discussions with consultants, evaluation of patient's response to treatment, examination of patient, obtaining history from patient or surrogate, ordering and performing treatments and interventions, ordering and review of laboratory studies, pulse oximetry, re-evaluation of patient's condition, review of old charts and ordering and review of radiographic studies    EKG Interpretation  Date/Time:  Monday May 30 2019 21:07:35 EST Ventricular Rate:  61 PR  Interval:  116 QRS Duration: 84 QT Interval:  368 QTC Calculation: 371 R Axis:   79 Text Interpretation: Sinus rhythm Since last tracing rate slower Otherwise no significant change Confirmed by Daleen Bo [phone]) on 05/30/2019 9:12:22 PM        Nursing Notes Reviewed/ Care Coordinated Applicable Imaging Reviewed Interpretation of Laboratory Data incorporated into ED treatment  The patient appears reasonably screened and/or stabilized for discharge and I doubt any other medical condition or other [facility] requiring further screening, evaluation, or treatment in the ED at this time prior to discharge.  Plan: Home Medications-continue routine medications; Home Treatments-low-salt diet; return here if the recommended treatment, does  not improve the symptoms; Recommended follow up-follow-up with cardiology, and PCP.        Daleen Bo, MD 05/30/19 2108    Daleen Bo, MD 05/30/19 2115

## 2019-05-30 NOTE — ED Notes (Signed)
Pt taken to x-ray and will go to room 40 after.

## 2019-05-30 NOTE — ED Provider Notes (Signed)
[facility] EMERGENCY DEPARTMENT Provider Note   CSN: [id number] Arrival date & time: 05/30/19  1141     History Chief Complaint  Patient presents with  . Abnormal EKG/ TACHY    William Avila is a 74 y.o. male.  Patient is a 74 year old male with a history arthritis but otherwise relatively healthy who is presenting today from short stay after having a surgical procedure on his left upper arm due to several runs of tachycardia nausea and vomiting.  Patient reports that after the procedure was finished he started having a nauseated feeling in the center of his chest and epigastric region and then had dry heaving and is now vomiting some minimal foam.  In between episodes he feels okay.  He denies any palpitations or chest pain.  No prior history of cardiac issues.  He takes no medications regularly.  He received fentanyl, Versed, Zofran, Decadron and a left upper extremity block during the procedure per anesthesia.  Patient reports he does have a history of a hernia that was repaired but denies any abdominal pain at this time.  He has had multiple episodes of vomiting upon arrival.  He did receive Reglan when he got here due to the ongoing nausea.  He denies any shortness of breath.  The history is provided by the patient.       Past Medical History:  Diagnosis Date  . Acute appendicitis 01/21/2014  . Arthritis   . History of hiatal hernia     Patient Active Problem List   Diagnosis Date Noted  . Primary osteoarthritis of first carpometacarpal joint of left hand 04/25/2019  . Incarcerated hiatal hernia s/p lap repair 12/26/2015 12/27/2015  . Epigastric pain 12/23/2015  . Dehydration 12/23/2015  . Protein-calorie malnutrition, moderate ([place]) 12/23/2015  . Leukocytosis 12/23/2015  . Retroperitoneal mass-large calcified mass, present since 2015 12/23/2015    Past Surgical History:  Procedure Laterality Date  . BACK SURGERY     and neck and trigger finger  surgery  . CERVICAL SPINE SURGERY    . ESOPHAGOGASTRODUODENOSCOPY (EGD) WITH PROPOFOL Left 12/24/2015   Procedure: ESOPHAGOGASTRODUODENOSCOPY (EGD) WITH PROPOFOL;  Surgeon: Otis Brace, MD;  Location: [facility];  Service: Gastroenterology;  Laterality: Left;  . HIATAL HERNIA REPAIR N/A 12/26/2015   Procedure: LAPAROSCOPIC REPAIR OF HIATAL HERNIA WITH MESH;  Surgeon: Michael Boston, MD;  Location: [facility];  Service: General;  Laterality: N/A;  . LAPAROSCOPIC APPENDECTOMY N/A 01/21/2014   Procedure: APPENDECTOMY LAPAROSCOPIC;  Surgeon: Autumn Messing III, MD;  Location: [facility];  Service: General;  Laterality: N/A;  . TRIGGER FINGER RELEASE         No family history on file.  Social History   Tobacco Use  . Smoking status: Former Smoker    Types: Cigarettes  . Smokeless tobacco: Never Used  Substance Use Topics  . Alcohol use: Yes    Comment: Occassionally  . Drug use: No    Home Medications Prior to Admission medications   Medication Sig Start Date End Date Taking? Authorizing Provider  acetaminophen (TYLENOL) 500 MG tablet Take 500 mg by mouth every 6 (six) hours as needed for mild pain.    [provider]  docusate (COLACE) 50 MG/5ML liquid Take 10 mLs (100 mg total) by mouth 2 (two) times daily. 12/28/15   Bonnielee Haff, MD  hydrALAZINE (APRESOLINE) 25 MG tablet Take 1 tablet (25 mg total) by mouth every 8 (eight) hours. Please crush before taking 12/28/15   Bonnielee Haff,  MD  HYDROmorphone HCl (DILAUDID) 1 MG/ML LIQD Take 1-2 mLs (1-2 mg total) by mouth every 4 (four) hours as needed for severe pain. 12/28/15   Bonnielee Haff, MD  Multiple Vitamins-Minerals (MULTIVITAMIN WITH MINERALS) tablet Take 1 tablet by mouth daily.    [provider]  omeprazole (PRILOSEC) 2 mg/mL SUSP Take 10 mLs (20 mg total) by mouth 2 (two) times daily before a meal. 12/28/15   Bonnielee Haff, MD  ondansetron (ZOFRAN ODT) 8 MG disintegrating tablet Take 1 tablet (8 mg total) by mouth every 8  (eight) hours as needed for nausea or vomiting. 12/22/15   Evalee Jefferson, PA-C  polyethylene glycol (MIRALAX / GLYCOLAX) packet Take 17 g by mouth every 12 (twelve) hours as needed for mild constipation, moderate constipation or severe constipation. 12/28/15   Bonnielee Haff, MD    Allergies    Codeine, Ibuprofen, and Robaxin [methocarbamol]  Review of Systems   Review of Systems  All other systems reviewed and are negative.   Physical Exam Updated Vital Signs BP (!) 181/124   Pulse (!) 53   Temp 97.7 F (36.5 C) (Oral)   Resp (!) 31   SpO2 98%   Physical Exam Vitals and nursing note reviewed.  Constitutional:      General: He is not in acute distress.    Appearance: Normal appearance. He is well-developed and normal weight.  HENT:     Head: Normocephalic and atraumatic.     Mouth/Throat:     Mouth: Mucous membranes are moist.  Eyes:     Conjunctiva/sclera: Conjunctivae normal.     Pupils: Pupils are equal, round, and reactive to light.  Cardiovascular:     Rate and Rhythm: Regular rhythm. Bradycardia present.     Pulses: Normal pulses.     Heart sounds: No murmur.  Pulmonary:     Effort: Pulmonary effort is normal. No respiratory distress.     Breath sounds: Normal breath sounds. No wheezing or rales.  Abdominal:     General: There is no distension.     Palpations: Abdomen is soft.     Tenderness: There is no abdominal tenderness. There is no guarding or rebound.  Musculoskeletal:     Cervical back: Normal range of motion and neck supple.     Comments: Left upper arm is bandaged and in a sling.  No movement or sensation at this time  Skin:    General: Skin is warm and dry.     Findings: No erythema or rash.  Neurological:     Mental Status: He is alert and oriented to person, place, and time.  Psychiatric:        Mood and Affect: Mood normal.        Behavior: Behavior normal.        Thought Content: Thought content normal.     ED Results / Procedures /  Treatments   Labs (all labs ordered are listed, but only abnormal results are displayed) Labs Reviewed  BASIC METABOLIC PANEL - Abnormal; Notable for the following components:      Result Value   Glucose, Bld 155 (*)    Anion gap 16 (*)    All other components within normal limits  CBC  MAGNESIUM  TSH  TROPONIN I (HIGH SENSITIVITY)  TROPONIN I (HIGH SENSITIVITY)    EKG EKG Interpretation  Date/Time:  Monday May 30 2019 11:51:43 EST Ventricular Rate:  153 PR Interval:  116 QRS Duration: 82 QT Interval:  252 QTC Calculation:  402 R Axis:   45 Text Interpretation: Sinus tachycardia Nonspecific ST and T wave abnormality Confirmed by Blanchie Dessert [phone]) on 05/30/2019 12:21:11 PM   Radiology DG Chest 2 View  Result Date: 05/30/2019 CLINICAL DATA:  Supraventricular tachycardia EXAM: CHEST - 2 VIEW COMPARISON:  December 23, 2015.  CT abdomen and pelvis September 21, 2015 FINDINGS: There is mild scarring in the bases. No edema or consolidation. Heart size and pulmonary vascular normal. Aorta is diffusely prominent and somewhat tortuous. There is marked anterior wedging at L1 with thoracolumbar dextroscoliosis. There is postoperative change in the lower cervical region. No pneumothorax. IMPRESSION: No edema or consolidation.  Areas of mild scarring. Heart size normal. Tortuous aorta raises question of chronic hypertensive change. Stable compression of the L1 vertebral body. Electronically Signed   By: Lowella Grip III M.D.   On: 05/30/2019 12:28    Procedures Procedures (including critical care time)  Medications Ordered in ED Medications  sodium chloride flush (NS) 0.9 % injection 3 mL (3 mLs Intravenous Given 05/30/19 1202)  metoCLOPramide (REGLAN) injection 10 mg (10 mg Intravenous Given 05/30/19 1202)    ED Course  I have reviewed the triage vital signs and the nursing notes.  Pertinent labs & imaging results that were available during my care of the patient were reviewed  by me and considered in my medical decision making (see chart for details).    MDM Rules/Calculators/A&P                      Patient presenting after surgery today due to ongoing nausea vomiting and intermittent tachycardia.  Patient states that he feels a nausea sensation and then starts dry heaving.  Feel that the tachycardia coincides with these times.  When patient is not feeling nauseated heart rates are in the 50s and 60s.  He denies any chest pain.  Patient has been hypertensive here but reported that normally his blood pressure is normal at home.  He has no abdominal pain.  Breath sounds are clear.  Patient's chest x-ray shows no edema or consolidation.  Oxygen saturation is 98% on room air.  Patient's EKG upon arrival showed a sinus tachycardia in the 150s but now currently on exam heart rate in the 50s.  Unclear if he is having a reaction to the anesthesia.  Want to rule out acute cardiac pathology.  Labs are pending.  We will continue to monitor.  3:00 PM Patient continued with intermittent episodes of tachycardia back to sinus rhythm with bradycardia in the 50s.  Second EKG is more consistent with atrial fibrillation.  Feel that he is going in and out of paroxysmal atrial fibrillation.  No prior history of similar.  Denies any prior history of palpitations.  States that he is now feeling much better and having no further vomiting requesting something to drink.  Currently heart rates in the 62s.  Will discuss with cardiology. Chadvas score of 1.    CHA2DS2/VAS Stroke Risk Points      N/A >= 2 Points: High Risk  1 - 1.99 Points: Medium Risk  0 Points: Low Risk    A final score could not be computed because of missing components.: Last  Change: N/A     This score determines the patient's risk of having a stroke if the  patient has atrial fibrillation.      This score is not applicable to this patient. Components are not  calculated.    3:52 PM Spoke with  cardiology who is going to  evaluate his rhythm strips.  May have him set up for a monitor before being discharged home.  Patient's block is starting to wear off and he is now having pain in his left arm.  4:19 PM Patient still have an intermittent runs of tachycardia.  Cardiology recommended oral potassium replacement, check a magnesium and TSH and patient was given a 12.5 mg dose of metoprolol.  We will continue to monitor and cardiology will come evaluate the patient.  Plan will be for the patient to go home for outpatient follow-up.  Final Clinical Impression(s) / ED Diagnoses Final diagnoses:  Atrial tachycardia [facility])    Rx / DC Orders ED Discharge Orders    None       Blanchie Dessert, MD 05/30/19 1620

## 2019-05-30 NOTE — Discharge Instructions (Addendum)
He developed a rapid heartbeat, following the surgery for an unclear reason.  We are prescribing a medication to help slow down the heart rate, which should also improve your blood pressure, somewhat.  Your blood pressure and the rapid heartbeat will need follow-up.  The cardiologist has set an appointment up for you and is sending a device to wear on your chest, to monitor the heart rate.  Call your PCP for a follow-up appointment about your blood pressure next week.  Return here, if needed, for problems.

## 2019-05-30 NOTE — ED Notes (Signed)
Dinner Tray Ordered @ [phone].

## 2019-05-30 NOTE — Consult Note (Signed)
Cardiology Consultation:   Patient ID: William Avila MRN: [id number]; DOB: 1946/01/03  Admit date: 05/30/2019 Date of Consult: 05/30/2019  Primary Care Provider: [name], MD Primary Cardiologist: No primary care provider on file. * Primary Electrophysiologist:  None   Patient Profile:   William Avila is a 74 y.o. male with a significant past medical history who is being seen today for the evaluation of tachycardia at the request of Dr. Maryan Rued.  History of Present Illness:   William Avila is a 74 year old male with no significant past medical history who presented to the ED today after a surgical procedure on his left arm.  Following the procedure, he was noted to have runs of tachycardia, in addition to nausea and dr heaves.  He denies any chest pain, dyspnea, or palpitations.  For the procedure today, a left upper extremity block was done as well as fentanyl, Versed, Zofran, and Decadron were administered.  He is having frequent short episodes of tacchycardia, denies any symptoms during episodes.  Heart Pathway Score:     Past Medical History:  Diagnosis Date  . Acute appendicitis 01/21/2014  . Arthritis   . History of hiatal hernia     Past Surgical History:  Procedure Laterality Date  . BACK SURGERY     and neck and trigger finger surgery  . CERVICAL SPINE SURGERY    . ESOPHAGOGASTRODUODENOSCOPY (EGD) WITH PROPOFOL Left 12/24/2015   Procedure: ESOPHAGOGASTRODUODENOSCOPY (EGD) WITH PROPOFOL;  Surgeon: Otis Brace, MD;  Location: [place];  Service: Gastroenterology;  Laterality: Left;  . HIATAL HERNIA REPAIR N/A 12/26/2015   Procedure: LAPAROSCOPIC REPAIR OF HIATAL HERNIA WITH MESH;  Surgeon: Michael Boston, MD;  Location: [place];  Service: General;  Laterality: N/A;  . LAPAROSCOPIC APPENDECTOMY N/A 01/21/2014   Procedure: APPENDECTOMY LAPAROSCOPIC;  Surgeon: Autumn Messing III, MD;  Location: [place];  Service: General;  Laterality: N/A;  . TRIGGER FINGER RELEASE         Inpatient Medications: Scheduled Meds: . metoprolol tartrate  12.5 mg Oral Once  . potassium chloride  40 mEq Oral Once   Continuous Infusions:  PRN Meds:   Allergies:    Allergies  Allergen Reactions  . Codeine Anaphylaxis  . Ibuprofen Anaphylaxis  . Robaxin [Methocarbamol] Anaphylaxis, Hives and Swelling    Social History:   Social History   Socioeconomic History  . Marital status: Married    Spouse name: Not on file  . Number of children: Not on file  . Years of education: Not on file  . Highest education level: Not on file  Occupational History  . Not on file  Tobacco Use  . Smoking status: Former Smoker    Types: Cigarettes  . Smokeless tobacco: Never Used  Substance and Sexual Activity  . Alcohol use: Yes    Comment: Occassionally  . Drug use: No  . Sexual activity: Not on file  Other Topics Concern  . Not on file  Social History Narrative  . Not on file   Social Determinants of Health   Financial Resource Strain:   . Difficulty of Paying Living Expenses: Not on file  Food Insecurity:   . Worried About Charity fundraiser in the Last Year: Not on file  . Ran Out of Food in the Last Year: Not on file  Transportation Needs:   . Lack of Transportation (Medical): Not on file  . Lack of Transportation (Non-Medical): Not on file  Physical Activity:   . Days of Exercise per  Week: Not on file  . Minutes of Exercise per Session: Not on file  Stress:   . Feeling of Stress : Not on file  Social Connections:   . Frequency of Communication with Friends and Family: Not on file  . Frequency of Social Gatherings with Friends and Family: Not on file  . Attends Religious Services: Not on file  . Active Member of Clubs or Organizations: Not on file  . Attends Archivist Meetings: Not on file  . Marital Status: Not on file  Intimate Partner Violence:   . Fear of Current or Ex-Partner: Not on file  . Emotionally Abused: Not on file  .  Physically Abused: Not on file  . Sexually Abused: Not on file    Family History:   Mother had MI  ROS:  Please see the history of present illness.   All other ROS reviewed and negative.     Physical Exam/Data:   Vitals:   05/30/19 1330 05/30/19 1345 05/30/19 1400 05/30/19 1415  BP: (!) 162/105 (!) 162/98 (!) 160/96 (!) 144/97  Pulse: (!) 56 (!) 54 (!) 53 (!) 51  Resp: 17 15 14 14   Temp:      TempSrc:      SpO2: 95% 97% 98% 96%   No intake or output data in the 24 hours ending 05/30/19 1627 Last 3 Weights 06/09/2018 12/23/2015 12/22/2015  Weight (lbs) 176 lb 170 lb 170 lb  Weight (kg) 79.833 kg 77.111 kg 77.111 kg     There is no height or weight on file to calculate BMI.  General:  Well nourished, well developed, in no acute distress HEENT: normal Lymph: no adenopathy Neck: no JVD Endocrine:  No thryomegaly Vascular: No carotid bruits Cardiac:  normal S1, S2; RRR; no murmur  Lungs:  clear to auscultation bilaterally, no wheezing, rhonchi or rales  Abd: soft, nontender, no hepatomegaly  Ext: no edema Musculoskeletal:  No deformities.  L arm in sling Skin: warm and dry  Neuro:  CNs 2-12 intact, no focal abnormalities noted Psych:  Normal affect   EKG:  The EKG was personally reviewed and demonstrates: Likely an atrial tachycardia, as ectopic P wave precedes each QRS.  No clear flutter waves.  Rate 153. Telemetry:  Telemetry was personally reviewed and demonstrates: Multiple episodes of supraventricular tachycardia, suspect atrial tachycardia as above  Relevant CV Studies:   Laboratory Data:  High Sensitivity Troponin:   Recent Labs  Lab 05/30/19 1300  TROPONINIHS 5     Chemistry Recent Labs  Lab 05/30/19 1300  NA 136  K 3.5  CL 98  CO2 22  GLUCOSE 155*  BUN 12  CREATININE 0.93  CALCIUM 9.0  GFRNONAA >60  GFRAA >60  ANIONGAP 16*    No results for input(s): PROT, ALBUMIN, AST, ALT, ALKPHOS, BILITOT in the last 168 hours. Hematology Recent Labs  Lab  05/30/19 1300  WBC 9.6  RBC 4.89  HGB 16.1  HCT 47.1  MCV 96.3  MCH 32.9  MCHC 34.2  RDW 12.6  PLT 265   BNPNo results for input(s): BNP, PROBNP in the last 168 hours.  DDimer No results for input(s): DDIMER in the last 168 hours.   Radiology/Studies:  DG Chest 2 View  Result Date: 05/30/2019 CLINICAL DATA:  Supraventricular tachycardia EXAM: CHEST - 2 VIEW COMPARISON:  December 23, 2015.  CT abdomen and pelvis September 21, 2015 FINDINGS: There is mild scarring in the bases. No edema or consolidation. Heart size and pulmonary  vascular normal. Aorta is diffusely prominent and somewhat tortuous. There is marked anterior wedging at L1 with thoracolumbar dextroscoliosis. There is postoperative change in the lower cervical region. No pneumothorax. IMPRESSION: No edema or consolidation.  Areas of mild scarring. Heart size normal. Tortuous aorta raises question of chronic hypertensive change. Stable compression of the L1 vertebral body. Electronically Signed   By: Lowella Grip III M.D.   On: 05/30/2019 12:28   {  Assessment and Plan:   Tachycardia: Frequent episodes of regular narrow complex tachycardia, with rates up to 150s.  Differential includes SVT, atrial tachycardia, atrial flutter.  EKG during episodes shows what appears to be an ectopic P wave, suggesting atrial tachycardia.  Atypical atrial flutter would also be on the differential though no clear flutter waves seen, and considering CHADS-VASc score 1, would not need to be anticoagulated even if it was atrial flutter.  He is asymptomatic during these episodes.  His resting heart rate is in 50s to 60s.  Recommend low-dose metoprolol (12.5 mg twice daily) to suppress atrial tachycardia.  Would check TSH, magnesium.  Replete potassium for goal >4 and magnesium for goal >2.  Will plan for Zio patch as outpatient and arrange cardiology follow-up.   For questions or updates, please contact [place] Please consult [email or web address] for  contact info under     Signed, Donato Heinz, MD  05/30/2019 4:27 PM

## 2019-06-01 ENCOUNTER — Telehealth: Payer: Self-pay | Admitting: Radiology

## 2019-06-01 NOTE — Telephone Encounter (Signed)
Enrolled patient for a Zio AT telemetry monitor to be mailed to patients home.

## 2019-06-02 DIAGNOSIS — M1812 Unilateral primary osteoarthritis of first carpometacarpal joint, left hand: Secondary | ICD-10-CM | POA: Diagnosis not present

## 2019-06-02 DIAGNOSIS — M66242 Spontaneous rupture of extensor tendons, left hand: Secondary | ICD-10-CM | POA: Diagnosis not present

## 2019-06-02 DIAGNOSIS — M79642 Pain in left hand: Secondary | ICD-10-CM | POA: Diagnosis not present

## 2019-06-02 DIAGNOSIS — M654 Radial styloid tenosynovitis [de Quervain]: Secondary | ICD-10-CM | POA: Diagnosis not present

## 2019-06-08 ENCOUNTER — Ambulatory Visit (INDEPENDENT_AMBULATORY_CARE_PROVIDER_SITE_OTHER): Payer: Medicare HMO

## 2019-06-08 DIAGNOSIS — I471 Supraventricular tachycardia: Secondary | ICD-10-CM

## 2019-06-08 DIAGNOSIS — Z0000 Encounter for general adult medical examination without abnormal findings: Secondary | ICD-10-CM | POA: Diagnosis not present

## 2019-06-08 DIAGNOSIS — R21 Rash and other nonspecific skin eruption: Secondary | ICD-10-CM | POA: Diagnosis not present

## 2019-06-08 DIAGNOSIS — Z1389 Encounter for screening for other disorder: Secondary | ICD-10-CM | POA: Diagnosis not present

## 2019-06-08 DIAGNOSIS — R739 Hyperglycemia, unspecified: Secondary | ICD-10-CM | POA: Diagnosis not present

## 2019-06-10 ENCOUNTER — Telehealth: Payer: Self-pay | Admitting: Cardiology

## 2019-06-10 NOTE — Telephone Encounter (Signed)
New Message  Katharine Look with [employer] is calling in as a courtesy to let the office know that patient's monitor is not transmitting due to weak or no cell phone reception in patient's area. States that transmissions will backlog and send when patient has cell phone reception.

## 2019-06-11 DIAGNOSIS — I471 Supraventricular tachycardia: Secondary | ICD-10-CM | POA: Diagnosis not present

## 2019-06-23 ENCOUNTER — Telehealth: Payer: Self-pay | Admitting: Cardiology

## 2019-06-23 ENCOUNTER — Ambulatory Visit: Payer: Medicare HMO | Admitting: Cardiology

## 2019-06-23 ENCOUNTER — Other Ambulatory Visit: Payer: Self-pay

## 2019-06-23 ENCOUNTER — Encounter: Payer: Self-pay | Admitting: Cardiology

## 2019-06-23 VITALS — BP 132/84 | HR 52 | Temp 97.3°F | Ht 71.0 in | Wt 174.0 lb

## 2019-06-23 DIAGNOSIS — R079 Chest pain, unspecified: Secondary | ICD-10-CM | POA: Diagnosis not present

## 2019-06-23 DIAGNOSIS — I471 Supraventricular tachycardia: Secondary | ICD-10-CM

## 2019-06-23 DIAGNOSIS — R011 Cardiac murmur, unspecified: Secondary | ICD-10-CM | POA: Diagnosis not present

## 2019-06-23 NOTE — Progress Notes (Signed)
Cardiology Office Note:    Date:  06/23/2019   ID:  William Avila, DOB 08/23/1945, MRN [id number]  PCP:  William Orn, MD  Cardiologist:  No primary care provider on file.  Electrophysiologist:  None   Referring MD: William Orn, MD   Chief Complaint  Patient presents with  . Tachycardia    History of Present Illness:    William Avila is a 74 y.o. male with a hx of arthritis who presents as a hospital follow-up.  He was seen in the ED on 05/30/2019 after having a surgical procedure on his arm and having runs of tachycardia following this.  Telemetry in the ED showed frequent episodes of narrow complex tachycardia, with rates up to 150s.  Appeared to be ectopic P wave during episodes suggesting atrial tachycardia as the cause.  He was started on metoprolol.  Zio patch was ordered as outpatient for further evaluation.    He reports that since discharge from the ED he has felt well.  He denies any palpitations, lightheadedness, syncope, or dyspnea.  He did not take metoprolol on discharge.  He turned in his Zio patch yesterday. He does note that he will have chest pain with heavy exertion that lasts for a few seconds and resolves.  He underwent a Lexiscan Myoview 05/2018 for the symptoms that showed no evidence of ischemia.  Past Medical History:  Diagnosis Date  . Acute appendicitis 01/21/2014  . Arthritis   . History of hiatal hernia     Past Surgical History:  Procedure Laterality Date  . BACK SURGERY     and neck and trigger finger surgery  . CERVICAL SPINE SURGERY    . ESOPHAGOGASTRODUODENOSCOPY (EGD) WITH PROPOFOL Left 12/24/2015   Procedure: ESOPHAGOGASTRODUODENOSCOPY (EGD) WITH PROPOFOL;  Surgeon: Otis Brace, MD;  Location: [place];  Service: Gastroenterology;  Laterality: Left;  . HIATAL HERNIA REPAIR N/A 12/26/2015   Procedure: LAPAROSCOPIC REPAIR OF HIATAL HERNIA WITH MESH;  Surgeon: Michael Boston, MD;  Location: [place];  Service: General;  Laterality: N/A;    . LAPAROSCOPIC APPENDECTOMY N/A 01/21/2014   Procedure: APPENDECTOMY LAPAROSCOPIC;  Surgeon: Autumn Messing III, MD;  Location: [place];  Service: General;  Laterality: N/A;  . TRIGGER FINGER RELEASE      Current Medications: Current Meds  Medication Sig  . acetaminophen (TYLENOL) 500 MG tablet Take 500 mg by mouth every 6 (six) hours as needed for mild pain.  Marland Kitchen docusate (COLACE) 50 MG/5ML liquid Take 10 mLs (100 mg total) by mouth 2 (two) times daily.  . hydrALAZINE (APRESOLINE) 25 MG tablet Take 1 tablet (25 mg total) by mouth every 8 (eight) hours. Please crush before taking  . HYDROmorphone HCl (DILAUDID) 1 MG/ML LIQD Take 1-2 mLs (1-2 mg total) by mouth every 4 (four) hours as needed for severe pain.  . metoprolol tartrate (LOPRESSOR) 25 MG tablet Take 0.5 tablets (12.5 mg total) by mouth 2 (two) times daily.  . Multiple Vitamins-Minerals (MULTIVITAMIN WITH MINERALS) tablet Take 1 tablet by mouth daily.  Marland Kitchen omeprazole (PRILOSEC) 2 mg/mL SUSP Take 10 mLs (20 mg total) by mouth 2 (two) times daily before a meal.  . ondansetron (ZOFRAN ODT) 8 MG disintegrating tablet Take 1 tablet (8 mg total) by mouth every 8 (eight) hours as needed for nausea or vomiting.  . polyethylene glycol (MIRALAX / GLYCOLAX) packet Take 17 g by mouth every 12 (twelve) hours as needed for mild constipation, moderate constipation or severe constipation.  Marland Kitchen Propylene Glycol (SYSTANE COMPLETE)  0.6 % SOLN Apply 1 drop to eye daily.  . sildenafil (REVATIO) 20 MG tablet Take 20-60 mg by mouth daily as needed.  . triamcinolone cream (KENALOG) 0.1 % Apply 1 application topically 2 (two) times daily.     Allergies:   Codeine, Ibuprofen, and Robaxin [methocarbamol]   Social History   Socioeconomic History  . Marital status: Married    Spouse name: Not on file  . Number of children: Not on file  . Years of education: Not on file  . Highest education level: Not on file  Occupational History  . Not on file  Tobacco Use  .  Smoking status: Former Smoker    Types: Cigarettes  . Smokeless tobacco: Never Used  Substance and Sexual Activity  . Alcohol use: Yes    Comment: Occassionally  . Drug use: No  . Sexual activity: Not on file  Other Topics Concern  . Not on file  Social History Narrative  . Not on file   Social Determinants of Health   Financial Resource Strain:   . Difficulty of Paying Living Expenses: Not on file  Food Insecurity:   . Worried About Charity fundraiser in the Last Year: Not on file  . Ran Out of Food in the Last Year: Not on file  Transportation Needs:   . Lack of Transportation (Medical): Not on file  . Lack of Transportation (Non-Medical): Not on file  Physical Activity:   . Days of Exercise per Week: Not on file  . Minutes of Exercise per Session: Not on file  Stress:   . Feeling of Stress : Not on file  Social Connections:   . Frequency of Communication with Friends and Family: Not on file  . Frequency of Social Gatherings with Friends and Family: Not on file  . Attends Religious Services: Not on file  . Active Member of Clubs or Organizations: Not on file  . Attends Archivist Meetings: Not on file  . Marital Status: Not on file     Family History: No relevant family history   ROS:   Please see the history of present illness.    All other systems reviewed and are negative.  EKGs/Labs/Other Studies Reviewed:    The following studies were reviewed today:   EKG:  EKG is  ordered today.  The ekg ordered today demonstrates sinus rhythm, rate 52, no ST/T abnormalities  Recent Labs: 05/30/2019: BUN 12; Creatinine, Ser 0.93; Hemoglobin 16.1; Magnesium 1.7; Platelets 265; Potassium 3.5; Sodium 136; TSH 0.584  Recent Lipid Panel No results found for: CHOL, TRIG, HDL, CHOLHDL, VLDL, LDLCALC, LDLDIRECT   Lexiscan Myoview 06/09/18:  Nuclear stress EF: 62%.  No T wave inversion was noted during stress.  There was no ST segment deviation noted during  stress.  This is a low risk study.   No significant reversible ischemia. LVEF 62% with normal wall motion. This is a low risk study.  Physical Exam:    VS:  BP 132/84   Pulse (!) 52   Temp (!) 97.3 F (36.3 C)   Ht 5\' 11"  (1.803 m)   Wt 174 lb (78.9 kg)   SpO2 95%   BMI 24.27 kg/m     Wt Readings from Last 3 Encounters:  06/23/19 174 lb (78.9 kg)  06/09/18 176 lb (79.8 kg)  12/23/15 170 lb (77.1 kg)     GEN:  Well nourished, well developed in no acute distress HEENT: Normal NECK: No JVD CARDIAC: Bradycardic,  regular rhythm, 2/6 systolic heart murmur RESPIRATORY:  Clear to auscultation without rales, wheezing or rhonchi  ABDOMEN: Soft, non-tender, non-distended MUSCULOSKELETAL:  No edema; No deformity  SKIN: Warm and dry NEUROLOGIC:  Alert and oriented x 3 PSYCHIATRIC:  Normal affect   ASSESSMENT:    1. SVT (supraventricular tachycardia) ([place])   2. Heart murmur    PLAN:    In order of problems listed above:  Tachycardia: Frequent episodes of regular narrow complex tachycardia, with rates up to 150s following his procedure on 05/30/19.  Differential includes SVT, atrial tachycardia, atrial flutter.  EKG during episodes shows what appears to be an ectopic P wave, suggesting atrial tachycardia.  Atypical atrial flutter would also be on the differential though no clear flutter waves seen, and considering CHADS-VASc score 1, would not need to be anticoagulated even if it was atrial flutter.  He was asymptomatic during these episodes.  TSH unremarkable.  Discharged on metoprolol and 2-week Zio patch.  He did not take metoprolol on discharge. -Follow-up Zio patch -Check electrolytes  Heart murmur: Systolic heart murmur on exam, will check TTE  Chest pain: Atypical in description.  Negative stress test 05/2018.  Will continue to monitor  Virtual f/u in 1 month  Medication Adjustments/Labs and Tests Ordered: Current medicines are reviewed at length with the patient today.   Concerns regarding medicines are outlined above.  Orders Placed This Encounter  Procedures  . Basic metabolic panel  . Magnesium  . EKG 12-Lead  . ECHOCARDIOGRAM COMPLETE   No orders of the defined types were placed in this encounter.   Patient Instructions  Medication Instructions:  Your physician recommends that you continue on your current medications as directed. Please refer to the Current Medication list given to you today.  *If you need a refill on your cardiac medications before your next appointment, please call your pharmacy*   Lab Work: BMET, [place] today  If you have labs (blood work) drawn today and your tests are completely normal, you will receive your results only by: Marland Kitchen MyChart Message (if you have MyChart) OR . A paper copy in the mail If you have any lab test that is abnormal or we need to change your treatment, we will call you to review the results.   Testing/Procedures: Your physician has requested that you have an echocardiogram. Echocardiography is a painless test that uses sound waves to create images of your heart. It provides your doctor with information about the size and shape of your heart and how well your heart's chambers and valves are working. This procedure takes approximately one hour. There are no restrictions for this procedure.  This will be done at our [facility] location:  [place]: At [facility], you and your health needs are our priority.  As part of our continuing mission to provide you with exceptional heart care, we have created designated Provider Care Teams.  These Care Teams include your primary Cardiologist (physician) and Advanced Practice Providers (APPs -  Physician Assistants and Nurse Practitioners) who all work together to provide you with the care you need, when you need it.  We recommend signing up for the patient portal called "MyChart".  Sign up information is provided on this After  Visit Summary.  MyChart is used to connect with patients for Virtual Visits (Telemedicine).  Patients are able to view lab/test results, encounter notes, upcoming appointments, etc.  Non-urgent messages can be sent to your provider as well.  To learn more about what you can do with MyChart, go to [email or web address].    Your next appointment:   1 month(s)  The format for your next appointment:   Virtual Visit   Provider:   Oswaldo Milian, MD       Signed, Donato Heinz, MD  06/23/2019 5:33 PM    [place]

## 2019-06-23 NOTE — Patient Instructions (Signed)
Medication Instructions:  Your physician recommends that you continue on your current medications as directed. Please refer to the Current Medication list given to you today.  *If you need a refill on your cardiac medications before your next appointment, please call your pharmacy*   Lab Work: BMET, [place] today  If you have labs (blood work) drawn today and your tests are completely normal, you will receive your results only by: Marland Kitchen MyChart Message (if you have MyChart) OR . A paper copy in the mail If you have any lab test that is abnormal or we need to change your treatment, we will call you to review the results.   Testing/Procedures: Your physician has requested that you have an echocardiogram. Echocardiography is a painless test that uses sound waves to create images of your heart. It provides your doctor with information about the size and shape of your heart and how well your heart's chambers and valves are working. This procedure takes approximately one hour. There are no restrictions for this procedure.  This will be done at our [facility] location:  [place]: At [facility], you and your health needs are our priority.  As part of our continuing mission to provide you with exceptional heart care, we have created designated Provider Care Teams.  These Care Teams include your primary Cardiologist (physician) and Advanced Practice Providers (APPs -  Physician Assistants and Nurse Practitioners) who all work together to provide you with the care you need, when you need it.  We recommend signing up for the patient portal called "MyChart".  Sign up information is provided on this After Visit Summary.  MyChart is used to connect with patients for Virtual Visits (Telemedicine).  Patients are able to view lab/test results, encounter notes, upcoming appointments, etc.  Non-urgent messages can be sent to your provider as well.   To learn more about what you  can do with MyChart, go to [email or web address].    Your next appointment:   1 month(s)  The format for your next appointment:   Virtual Visit   Provider:   Oswaldo Milian, MD

## 2019-06-23 NOTE — Telephone Encounter (Signed)
Called patient wife- advised okay for her to come to appointment.  Wife verbalized understanding.

## 2019-06-23 NOTE — Telephone Encounter (Signed)
New Message    Pts wife is calling and says she needs to be with him for his appt because she helps him remember things   Please call back

## 2019-06-24 LAB — BASIC METABOLIC PANEL
BUN/Creatinine Ratio: 11 (ref 10–24)
BUN: 12 mg/dL (ref 8–27)
CO2: 27 mmol/L (ref 20–29)
Calcium: 9.7 mg/dL (ref 8.6–10.2)
Chloride: 101 mmol/L (ref 96–106)
Creatinine, Ser: 1.07 mg/dL (ref 0.76–1.27)
GFR calc Af Amer: 79 mL/min/{1.73_m2} (ref >59)
GFR calc non Af Amer: 68 mL/min/{1.73_m2} (ref >59)
Glucose: 88 mg/dL (ref 65–99)
Potassium: 4.3 mmol/L (ref 3.5–5.2)
Sodium: 140 mmol/L (ref 134–144)

## 2019-06-24 LAB — MAGNESIUM: Magnesium: 2.3 mg/dL (ref 1.6–2.3)

## 2019-06-28 ENCOUNTER — Encounter: Payer: Self-pay | Admitting: *Deleted

## 2019-06-28 DIAGNOSIS — M1812 Unilateral primary osteoarthritis of first carpometacarpal joint, left hand: Secondary | ICD-10-CM | POA: Diagnosis not present

## 2019-06-28 DIAGNOSIS — M654 Radial styloid tenosynovitis [de Quervain]: Secondary | ICD-10-CM | POA: Diagnosis not present

## 2019-06-28 DIAGNOSIS — M66242 Spontaneous rupture of extensor tendons, left hand: Secondary | ICD-10-CM | POA: Diagnosis not present

## 2019-06-28 DIAGNOSIS — M79642 Pain in left hand: Secondary | ICD-10-CM | POA: Diagnosis not present

## 2019-07-06 ENCOUNTER — Encounter (INDEPENDENT_AMBULATORY_CARE_PROVIDER_SITE_OTHER): Payer: Self-pay

## 2019-07-06 ENCOUNTER — Other Ambulatory Visit: Payer: Self-pay

## 2019-07-06 ENCOUNTER — Ambulatory Visit (HOSPITAL_COMMUNITY): Payer: Medicare HMO | Attending: Internal Medicine

## 2019-07-06 DIAGNOSIS — R011 Cardiac murmur, unspecified: Secondary | ICD-10-CM | POA: Insufficient documentation

## 2019-07-12 ENCOUNTER — Telehealth: Payer: Self-pay | Admitting: Cardiology

## 2019-07-12 DIAGNOSIS — M79642 Pain in left hand: Secondary | ICD-10-CM | POA: Diagnosis not present

## 2019-07-12 DIAGNOSIS — M66242 Spontaneous rupture of extensor tendons, left hand: Secondary | ICD-10-CM | POA: Diagnosis not present

## 2019-07-12 DIAGNOSIS — M1812 Unilateral primary osteoarthritis of first carpometacarpal joint, left hand: Secondary | ICD-10-CM | POA: Diagnosis not present

## 2019-07-12 DIAGNOSIS — M654 Radial styloid tenosynovitis [de Quervain]: Secondary | ICD-10-CM | POA: Diagnosis not present

## 2019-07-12 NOTE — Telephone Encounter (Signed)
FORWARD TO RN

## 2019-07-12 NOTE — Telephone Encounter (Signed)
Patient states he is returning a call to [facility].

## 2019-07-12 NOTE — Telephone Encounter (Signed)
Spoke to patient, see result note. 

## 2019-07-12 NOTE — Telephone Encounter (Signed)
Left message to call back  

## 2019-07-12 NOTE — Telephone Encounter (Signed)
William Avila is returning William Avila's call regarding Echo results. Please advise.

## 2019-07-24 NOTE — Progress Notes (Signed)
Virtual Visit via Telephone Note   This visit type was conducted due to national recommendations for restrictions regarding the COVID-19 Pandemic (e.g. social distancing) in an effort to limit this patient's exposure and mitigate transmission in our community.  Due to his co-morbid illnesses, this patient is at least at moderate risk for complications without adequate follow up.  This format is felt to be most appropriate for this patient at this time.  The patient did not have access to video technology/had technical difficulties with video requiring transitioning to audio format only (telephone).  All issues noted in this document were discussed and addressed.  No physical exam could be performed with this format.  Please refer to the patient's chart for his  consent to telehealth for [facility]   Date:  07/25/2019   ID:  William Avila, DOB 01-Jan-1946, MRN [id number]  Patient Location: Home Provider Location: Office  PCP:  [name], MD  Cardiologist:  No primary care provider on file.  Electrophysiologist:  None   Evaluation Performed:  Follow-Up Visit  Chief Complaint:  SVT  History of Present Illness:    William Avila is a 74 y.o. male with a hx of arthritis who presents for follow-up.  He was seen in the ED on 05/30/2019 after having a surgical procedure on his arm and having runs of tachycardia following this.  Telemetry in the ED showed frequent episodes of narrow complex tachycardia, with rates up to 150s.  Appeared to be ectopic P wave during episodes suggesting atrial tachycardia as the cause.  He was discharged on metoprolol but did not take.   TTE on 07/06/2019 showed EF 65 to 70%, moderate LVH, grade 1 diastolic dysfunction, normal RV function, moderate dilatation of the ascending aorta measuring 44 mm.  CMR with MRA was recommended to work-up LVH and aortic dilatation.  Zio patch x14 days showed 485 episodes of SVT, longest lasting nearly 5 minutes, occasional  PVCs (2% of beats).  Metoprolol was recommended, but patient reported he was asymptomatic and declined.  Since last clinic visit, he reports that he has been doing well.  Reports he is not felt any palpitations, lightheadedness, or syncope.  He continues to be very active, denies any chest pain or dyspnea    Past Medical History:  Diagnosis Date  . Acute appendicitis 01/21/2014  . Arthritis   . History of hiatal hernia    Past Surgical History:  Procedure Laterality Date  . BACK SURGERY     and neck and trigger finger surgery  . CERVICAL SPINE SURGERY    . ESOPHAGOGASTRODUODENOSCOPY (EGD) WITH PROPOFOL Left 12/24/2015   Procedure: ESOPHAGOGASTRODUODENOSCOPY (EGD) WITH PROPOFOL;  Surgeon: Otis Brace, MD;  Location: [place];  Service: Gastroenterology;  Laterality: Left;  . HIATAL HERNIA REPAIR N/A 12/26/2015   Procedure: LAPAROSCOPIC REPAIR OF HIATAL HERNIA WITH MESH;  Surgeon: Michael Boston, MD;  Location: [place];  Service: General;  Laterality: N/A;  . LAPAROSCOPIC APPENDECTOMY N/A 01/21/2014   Procedure: APPENDECTOMY LAPAROSCOPIC;  Surgeon: Autumn Messing III, MD;  Location: [place];  Service: General;  Laterality: N/A;  . TRIGGER FINGER RELEASE       Current Meds  Medication Sig  . acetaminophen (TYLENOL) 500 MG tablet Take 500 mg by mouth every 6 (six) hours as needed for mild pain.  Marland Kitchen docusate (COLACE) 50 MG/5ML liquid Take 10 mLs (100 mg total) by mouth 2 (two) times daily.  . hydrALAZINE (APRESOLINE) 25 MG tablet Take 1 tablet (25 mg  total) by mouth every 8 (eight) hours. Please crush before taking  . HYDROmorphone HCl (DILAUDID) 1 MG/ML LIQD Take 1-2 mLs (1-2 mg total) by mouth every 4 (four) hours as needed for severe pain.  . metoprolol tartrate (LOPRESSOR) 25 MG tablet Take 0.5 tablets (12.5 mg total) by mouth 2 (two) times daily.  . Multiple Vitamins-Minerals (MULTIVITAMIN WITH MINERALS) tablet Take 1 tablet by mouth daily.  Marland Kitchen omeprazole (PRILOSEC) 2 mg/mL SUSP Take 10 mLs  (20 mg total) by mouth 2 (two) times daily before a meal.  . ondansetron (ZOFRAN ODT) 8 MG disintegrating tablet Take 1 tablet (8 mg total) by mouth every 8 (eight) hours as needed for nausea or vomiting.  . polyethylene glycol (MIRALAX / GLYCOLAX) packet Take 17 g by mouth every 12 (twelve) hours as needed for mild constipation, moderate constipation or severe constipation.  Marland Kitchen Propylene Glycol (SYSTANE COMPLETE) 0.6 % SOLN Apply 1 drop to eye daily.  . sildenafil (REVATIO) 20 MG tablet Take 20-60 mg by mouth daily as needed.  . triamcinolone cream (KENALOG) 0.1 % Apply 1 application topically 2 (two) times daily.     Allergies:   Codeine, Ibuprofen, and Robaxin [methocarbamol]   Social History   Tobacco Use  . Smoking status: Former Smoker    Types: Cigarettes  . Smokeless tobacco: Never Used  Substance Use Topics  . Alcohol use: Yes    Comment: Occassionally  . Drug use: No     Family Hx: The patient's family history is not on file.  ROS:   Please see the history of present illness.     All other systems reviewed and are negative.   Prior CV studies:   The following studies were reviewed today:  TTE 07/06/19: 1. Left ventricular ejection fraction, by estimation, is 65 to 70%. The  left ventricle has normal function. The left ventricle has no regional  wall motion abnormalities. There is moderate left ventricular hypertrophy.  Left ventricular diastolic  parameters are consistent with Grade I diastolic dysfunction (impaired  relaxation).  2. Right ventricular systolic function is normal. The right ventricular  size is normal. There is normal pulmonary artery systolic pressure.  3. Left atrial size was moderately dilated.  4. The mitral valve is grossly normal. Trivial mitral valve  regurgitation.  5. The aortic valve is tricuspid. Aortic valve regurgitation is trivial.  Mild aortic valve sclerosis is present, with no evidence of aortic valve  stenosis.  6.  Aortic dilatation noted. There is moderate dilatation of the ascending  aorta measuring 44 mm.  7. The inferior vena cava is normal in size with greater than 50%  respiratory variability, suggesting right atrial pressure of 3 mmHg.   Cardiac monitor 06/23/19:  485 episodes of SVT, longest lasting 4 minutes 43 seconds,  One episode of NSVT, lasting 8 beats. May represent SVT with aberrancy  Occasional PACs (2% of beats)   14days of data recorded on Zio monitor. Patient had a min HR of 34 bpm, max HR of 203 bpm, and avg HR of 53 bpm. Predominant underlying rhythm was Sinus Rhythm. 485 episodes of SVT, longest lasting 4 minutes 43 seconds.  One episode of NSVT, lasting 8 beats.  No atrial fibrillation, high degree block, or pauses noted. Isolated ventricular ectopy was rare (<1%).  Isolated PACs were occasional (2% of beats).   There were 0 triggered events.   Labs/Other Tests and Data Reviewed:    EKG:  No ECG reviewed.  Recent Labs: 05/30/2019: Hemoglobin  16.1; Platelets 265; TSH 0.584 06/23/2019: BUN 12; Creatinine, Ser 1.07; Magnesium 2.3; Potassium 4.3; Sodium 140   Recent Lipid Panel No results found for: CHOL, TRIG, HDL, CHOLHDL, LDLCALC, LDLDIRECT  Wt Readings from Last 3 Encounters:  07/25/19 171 lb (77.6 kg)  06/23/19 174 lb (78.9 kg)  06/09/18 176 lb (79.8 kg)     Objective:    Vital Signs:  BP 134/84   Pulse (!) 47   Ht 5\' 11"  (1.803 m)   Wt 171 lb (77.6 kg)   SpO2 98%   BMI 23.85 kg/m    VITAL SIGNS:  reviewed  Gen: no respiratory distress, able to converse comfortably  ASSESSMENT & PLAN:    YDX:AJOINOMV episodes on recent monitor.  Asymptomatic  LVH: Moderate LVH on TTE despite no history of hypertension.  Evaluate for HCM vs amyloid with CMR  Aortic dilatation: Measures 44 mm in ascending aorta on TTE.  Will order MRA aorta to evaluate further  Chest pain: Atypical in description.  Negative stress test 05/2018.  Will continue to monitor  RTC in 3  months   Time:   Today, I have spent 11 minutes with the patient with telehealth technology discussing the above problems.     Medication Adjustments/Labs and Tests Ordered: Current medicines are reviewed at length with the patient today.  Concerns regarding medicines are outlined above.   Tests Ordered: Orders Placed This Encounter  Procedures  . MR CARDIAC MORPHOLOGY W WO CONTRAST  . MR ANGIO CHEST W WO CONTRAST    Medication Changes: No orders of the defined types were placed in this encounter.   Follow Up:  In Person in 3 month(s)  Signed, Donato Heinz, MD  07/25/2019 1:12 PM    [facility]

## 2019-07-25 ENCOUNTER — Encounter: Payer: Self-pay | Admitting: Cardiology

## 2019-07-25 ENCOUNTER — Telehealth (INDEPENDENT_AMBULATORY_CARE_PROVIDER_SITE_OTHER): Payer: Medicare HMO | Admitting: Cardiology

## 2019-07-25 VITALS — BP 134/84 | HR 47 | Ht 71.0 in | Wt 171.0 lb

## 2019-07-25 DIAGNOSIS — I77819 Aortic ectasia, unspecified site: Secondary | ICD-10-CM

## 2019-07-25 DIAGNOSIS — I471 Supraventricular tachycardia: Secondary | ICD-10-CM

## 2019-07-25 DIAGNOSIS — I517 Cardiomegaly: Secondary | ICD-10-CM

## 2019-07-25 NOTE — Patient Instructions (Signed)
Medication Instructions:  Your physician recommends that you continue on your current medications as directed. Please refer to the Current Medication list given to you today.  Lab Work: NONE  Testing/Procedures: Your physician has requested that you have a cardiac MRI. Cardiac MRI uses a computer to create images of your heart as its beating, producing both still and moving pictures of your heart and major blood vessels. For further information please visit [email or web address] Please follow the instruction sheet given to you today for more information.  MRA of chest/aorta  --these must be approved by insurance prior to scheduling.    Follow-Up: At [facility], you and your health needs are our priority.  As part of our continuing mission to provide you with exceptional heart care, we have created designated Provider Care Teams.  These Care Teams include your primary Cardiologist (physician) and Advanced Practice Providers (APPs -  Physician Assistants and Nurse Practitioners) who all work together to provide you with the care you need, when you need it.  We recommend signing up for the patient portal called "MyChart".  Sign up information is provided on this After Visit Summary.  MyChart is used to connect with patients for Virtual Visits (Telemedicine).  Patients are able to view lab/test results, encounter notes, upcoming appointments, etc.  Non-urgent messages can be sent to your provider as well.   To learn more about what you can do with MyChart, go to [email or web address].    Your next appointment:   3 month(s)  The format for your next appointment:   Either In Person or Virtual  Provider:   Oswaldo Milian, MD

## 2019-07-26 DIAGNOSIS — M1812 Unilateral primary osteoarthritis of first carpometacarpal joint, left hand: Secondary | ICD-10-CM | POA: Diagnosis not present

## 2019-07-26 DIAGNOSIS — M66242 Spontaneous rupture of extensor tendons, left hand: Secondary | ICD-10-CM | POA: Diagnosis not present

## 2019-07-26 DIAGNOSIS — M654 Radial styloid tenosynovitis [de Quervain]: Secondary | ICD-10-CM | POA: Diagnosis not present

## 2019-07-26 DIAGNOSIS — M79642 Pain in left hand: Secondary | ICD-10-CM | POA: Diagnosis not present

## 2019-08-01 ENCOUNTER — Telehealth: Payer: Self-pay | Admitting: Cardiology

## 2019-08-01 NOTE — Telephone Encounter (Signed)
Called to speak with patient concering Cardia MRI and MRA chest that was ordered by Dr. Gardiner Rhyme.  Patient is asking " what is a MRA and why does he need to have it done"

## 2019-08-05 ENCOUNTER — Telehealth: Payer: Self-pay | Admitting: Cardiology

## 2019-08-05 ENCOUNTER — Encounter: Payer: Self-pay | Admitting: Cardiology

## 2019-08-05 NOTE — Telephone Encounter (Signed)
Spoke with patient regarding appointment for Cardiac MRI and MRA chest scheduled Wednesday 09/14/19 at 8:00 am at [facility]---arrival time is 7:15 am 1st floor radiology.  Will mail information to patient

## 2019-08-09 DIAGNOSIS — M1812 Unilateral primary osteoarthritis of first carpometacarpal joint, left hand: Secondary | ICD-10-CM | POA: Diagnosis not present

## 2019-08-09 DIAGNOSIS — M25642 Stiffness of left hand, not elsewhere classified: Secondary | ICD-10-CM | POA: Diagnosis not present

## 2019-08-09 DIAGNOSIS — M79642 Pain in left hand: Secondary | ICD-10-CM | POA: Diagnosis not present

## 2019-08-09 DIAGNOSIS — M66242 Spontaneous rupture of extensor tendons, left hand: Secondary | ICD-10-CM | POA: Diagnosis not present

## 2019-09-13 ENCOUNTER — Telehealth (HOSPITAL_COMMUNITY): Payer: Self-pay | Admitting: *Deleted

## 2019-09-13 NOTE — Telephone Encounter (Signed)
Reaching out to patient to offer assistance regarding upcoming cardiac imaging study; pt verbalizes understanding of appt date/time, parking situation and where to check in, and verified current allergies; name and call back number provided for further questions should they arise  [facility] [phone] office [phone] cell

## 2019-09-14 ENCOUNTER — Ambulatory Visit (HOSPITAL_COMMUNITY): Payer: Medicare HMO

## 2019-09-14 ENCOUNTER — Ambulatory Visit (HOSPITAL_COMMUNITY)
Admission: RE | Admit: 2019-09-14 | Discharge: 2019-09-14 | Disposition: A | Discharge status: Home / Self Care | Payer: Medicare HMO | Source: Ambulatory Visit | Attending: Cardiology | Admitting: Cardiology

## 2019-09-14 ENCOUNTER — Other Ambulatory Visit: Payer: Self-pay

## 2019-09-14 DIAGNOSIS — I517 Cardiomegaly: Secondary | ICD-10-CM | POA: Diagnosis present

## 2019-09-14 DIAGNOSIS — I77819 Aortic ectasia, unspecified site: Secondary | ICD-10-CM | POA: Insufficient documentation

## 2019-09-14 MED ORDER — GADOBUTROL 1 MMOL/ML IV SOLN
10.0000 mL | Freq: Once | INTRAVENOUS | Status: AC | PRN
  Administered 2019-09-14: 10 mL via INTRAVENOUS

## 2019-09-20 ENCOUNTER — Telehealth: Payer: Self-pay | Admitting: Cardiology

## 2019-09-20 DIAGNOSIS — I712 Thoracic aortic aneurysm, without rupture, unspecified: Secondary | ICD-10-CM

## 2019-09-20 NOTE — Telephone Encounter (Signed)
New Message  Pt called and said that he had done some tests on 5/26 and haven't heard anything to see how the test went. Please call to discuss test with pt.

## 2019-09-20 NOTE — Telephone Encounter (Signed)
Spoke to patient cardiac mra results given.Advised to repeat in 1 year.

## 2019-09-20 NOTE — Addendum Note (Signed)
Addended by: Patria Mane A on: 09/20/2019 10:33 AM   Modules accepted: Orders

## 2019-10-25 NOTE — Progress Notes (Signed)
Cardiology Office Note:    Date:  10/29/2019   ID:  William Avila, DOB 1946/03/11, MRN [id number]  PCP:  [name], MD  Cardiologist:  No primary care provider on file.  Electrophysiologist:  None   Referring MD: [name], MD   No chief complaint on file.   History of Present Illness:    William Avila is a 74 y.o. male with a hx of arthritis who presents for follow-up. He was seen in the ED on 05/30/2019 after having a surgical procedure on his arm and having runs of tachycardia following this.Telemetry in the ED showed frequent episodes of narrow complex tachycardia, with rates up to 150s. Appeared to be ectopic P wave during episodes suggesting atrial tachycardia as the cause. He was discharged on metoprolol but did not take.  TTE on 07/06/2019 showed EF 65 to 70%, moderate LVH, grade 1 diastolic dysfunction, normal RV function, moderate dilatation of the ascending aorta measuring 44 mm.  CMR with MRA was recommended to work-up LVH and aortic dilatation.    CMR on 09/14/2019 showed mild asymmetric hypertrophy measuring 13 mm and basal septum not meeting criteria for HCM, no LGE, LV EF 61%, RVEF 63%, dilated ascending aorta measuring 41 mm.  Zio patch x14 days showed 485 episodes of SVT, longest lasting nearly 5 minutes, occasional PVCs (2% of beats).  Metoprolol was recommended, but patient reported he was asymptomatic and declined.  Since last clinic visit,  he reports that he had an episode of dizziness on 7/3.  States that he had been doing yard work and had onset of dizzy spell that lasted about 1 minute.  He did not have syncopal episode but felt very lightheaded.  He checked his heart rate 20 minutes following the dizzy spell and it was in the 140s.  Reports his resting heart rate has been in 40s to 50s.  Also reports his BP has been elevated when he checks at home, has been up to 180/106.  Also reports he has been having intermittent chest tightness, occurs in  center of his chest and can last for few minutes.  Occurs while working.   Past Medical History:  Diagnosis Date  . Acute appendicitis 01/21/2014  . Arthritis   . History of hiatal hernia     Past Surgical History:  Procedure Laterality Date  . BACK SURGERY     and neck and trigger finger surgery  . CERVICAL SPINE SURGERY    . ESOPHAGOGASTRODUODENOSCOPY (EGD) WITH PROPOFOL Left 12/24/2015   Procedure: ESOPHAGOGASTRODUODENOSCOPY (EGD) WITH PROPOFOL;  Surgeon: Otis Brace, MD;  Location: [place];  Service: Gastroenterology;  Laterality: Left;  . HIATAL HERNIA REPAIR N/A 12/26/2015   Procedure: LAPAROSCOPIC REPAIR OF HIATAL HERNIA WITH MESH;  Surgeon: Michael Boston, MD;  Location: [place];  Service: General;  Laterality: N/A;  . LAPAROSCOPIC APPENDECTOMY N/A 01/21/2014   Procedure: APPENDECTOMY LAPAROSCOPIC;  Surgeon: Autumn Messing III, MD;  Location: [place];  Service: General;  Laterality: N/A;  . TRIGGER FINGER RELEASE      Current Medications: Current Meds  Medication Sig  . acetaminophen (TYLENOL) 500 MG tablet Take 500 mg by mouth every 6 (six) hours as needed for mild pain.  . Multiple Vitamins-Minerals (MULTIVITAMIN WITH MINERALS) tablet Take 1 tablet by mouth daily.  . ondansetron (ZOFRAN ODT) 8 MG disintegrating tablet Take 1 tablet (8 mg total) by mouth every 8 (eight) hours as needed for nausea or vomiting.     Allergies:   Codeine, Ibuprofen,  and Robaxin [methocarbamol]   Social History   Socioeconomic History  . Marital status: Married    Spouse name: Not on file  . Number of children: Not on file  . Years of education: Not on file  . Highest education level: Not on file  Occupational History  . Not on file  Tobacco Use  . Smoking status: Former Smoker    Types: Cigarettes  . Smokeless tobacco: Never Used  Substance and Sexual Activity  . Alcohol use: Yes    Comment: Occassionally  . Drug use: No  . Sexual activity: Not on file  Other Topics Concern  . Not  on file  Social History Narrative  . Not on file   Social Determinants of Health   Financial Resource Strain:   . Difficulty of Paying Living Expenses:   Food Insecurity:   . Worried About Charity fundraiser in the Last Year:   . Arboriculturist in the Last Year:   Transportation Needs:   . Film/video editor (Medical):   Marland Kitchen Lack of Transportation (Non-Medical):   Physical Activity:   . Days of Exercise per Week:   . Minutes of Exercise per Session:   Stress:   . Feeling of Stress :   Social Connections:   . Frequency of Communication with Friends and Family:   . Frequency of Social Gatherings with Friends and Family:   . Attends Religious Services:   . Active Member of Clubs or Organizations:   . Attends Archivist Meetings:   Marland Kitchen Marital Status:      Family History: No relevant family history   ROS:   Please see the history of present illness.    All other systems reviewed and are negative.  EKGs/Labs/Other Studies Reviewed:    The following studies were reviewed today:   EKG:  EKG is  ordered today.  The ekg ordered today demonstrates sinus rhythm, rate 52, no ST/T abnormalities  Recent Labs: 05/30/2019: Hemoglobin 16.1; Platelets 265; TSH 0.584 06/23/2019: BUN 12; Creatinine, Ser 1.07; Magnesium 2.3; Potassium 4.3; Sodium 140  Recent Lipid Panel No results found for: CHOL, TRIG, HDL, CHOLHDL, VLDL, LDLCALC, LDLDIRECT   Lexiscan Myoview 06/09/18:  Nuclear stress EF: 62%.  No T wave inversion was noted during stress.  There was no ST segment deviation noted during stress.  This is a low risk study.   No significant reversible ischemia. LVEF 62% with normal wall motion. This is a low risk study.  Physical Exam:    VS:  BP 140/80   Pulse 87   Temp (!) 93.9 F (34.4 C)   Ht 5\' 11"  (1.803 m)   Wt 174 lb 6.4 oz (79.1 kg)   SpO2 98%   BMI 24.32 kg/m     Wt Readings from Last 3 Encounters:  10/26/19 174 lb 6.4 oz (79.1 kg)  07/25/19 171 lb  (77.6 kg)  06/23/19 174 lb (78.9 kg)     GEN:  Well nourished, well developed in no acute distress HEENT: Normal NECK: No JVD CARDIAC: Bradycardic, regular rhythm, 2/6 systolic heart murmur RESPIRATORY:  Clear to auscultation without rales, wheezing or rhonchi  ABDOMEN: Soft, non-tender, non-distended MUSCULOSKELETAL:  No edema; No deformity  SKIN: Warm and dry NEUROLOGIC:  Alert and oriented x 3 PSYCHIATRIC:  Normal affect   ASSESSMENT:    1. SVT (supraventricular tachycardia) (HCC)   2. Chest pain of uncertain etiology   3. Tachycardia-bradycardia syndrome ([place])   4. Aortic  dilatation ([place])   5. Essential hypertension    PLAN:    [id number] episodes on recent monitor.  Unable to tolerate low dose metoprolol as resting heart rate in 40s to 50s.  Previously thought to be asymptomatic from his SVT, but now reporting episode of lightheadedness with heart rate in 140s.  Given tachybrady syndrome, will refer to EP for evaluation.   LVH: CMR on 09/14/2019 showed mild asymmetric hypertrophy measuring 13 mm and basal septum not meeting criteria for HCM, no LGE  Aortic dilatation: dilated ascending aorta measuring 41 mm on MRA.  Will repeat MRA in 1 year for monitoring  Chest pain: Negative stress test 05/2018. Now having exertional chest pain, will evaluate further with exercise Myoview.  Hypertension: Not on any medications, but BP mildly elevated in clinic today and he reports elevated BPs at home.  Given his LVH on echo and considering his dilated aorta, will need better blood pressure control.  We will start lisinopril 10 mg daily.  Will check BMP in 1 week.  Asked patient to monitor BP daily for next 2 weeks and call with results.  RTC in 3 months  Medication Adjustments/Labs and Tests Ordered: Current medicines are reviewed at length with the patient today.  Concerns regarding medicines are outlined above.  Orders Placed This Encounter  Procedures  . Ambulatory referral  to Cardiac Electrophysiology  . MYOCARDIAL PERFUSION IMAGING   Meds ordered this encounter  Medications  . lisinopril (ZESTRIL) 10 MG tablet    Sig: Take 1 tablet (10 mg total) by mouth daily.    Dispense:  30 tablet    Refill:  3    Patient Instructions  Medication Instructions:  START lisinopril 10 mg daily  *If you need a refill on your cardiac medications before your next appointment, please call your pharmacy*   Lab Work: BMET at next appointment with pharmacist  If you have labs (blood work) drawn today and your tests are completely normal, you will receive your results only by: Marland Kitchen MyChart Message (if you have MyChart) OR . A paper copy in the mail If you have any lab test that is abnormal or we need to change your treatment, we will call you to review the results.   Testing/Procedures: MRA Aorta due in May 2022  Your physician has requested that you have en exercise stress myoview. For further information please visit [email or web address]. Please follow instruction sheet, as given.  Follow-Up: At [facility], you and your health needs are our priority.  As part of our continuing mission to provide you with exceptional heart care, we have created designated Provider Care Teams.  These Care Teams include your primary Cardiologist (physician) and Advanced Practice Providers (APPs -  Physician Assistants and Nurse Practitioners) who all work together to provide you with the care you need, when you need it.  We recommend signing up for the patient portal called "MyChart".  Sign up information is provided on this After Visit Summary.  MyChart is used to connect with patients for Virtual Visits (Telemedicine).  Patients are able to view lab/test results, encounter notes, upcoming appointments, etc.  Non-urgent messages can be sent to your provider as well.   To learn more about what you can do with MyChart, go to [email or web address].    Your next appointment:   2 weeks  with pharmacist [facility] clinic) 6-8 weeks with Dr. Gardiner Rhyme  Other Instructions You have been referred to: Electrophysiologist at our [facility] office  Signed, Donato Heinz, MD  10/29/2019 8:25 PM    [facility]

## 2019-10-26 ENCOUNTER — Ambulatory Visit: Payer: Medicare HMO | Admitting: Cardiology

## 2019-10-26 ENCOUNTER — Other Ambulatory Visit: Payer: Self-pay

## 2019-10-26 ENCOUNTER — Encounter: Payer: Self-pay | Admitting: Cardiology

## 2019-10-26 VITALS — BP 140/80 | HR 87 | Temp 93.9°F | Ht 71.0 in | Wt 174.4 lb

## 2019-10-26 DIAGNOSIS — I471 Supraventricular tachycardia, unspecified: Secondary | ICD-10-CM

## 2019-10-26 DIAGNOSIS — I77819 Aortic ectasia, unspecified site: Secondary | ICD-10-CM | POA: Diagnosis not present

## 2019-10-26 DIAGNOSIS — I10 Essential (primary) hypertension: Secondary | ICD-10-CM | POA: Diagnosis not present

## 2019-10-26 DIAGNOSIS — R079 Chest pain, unspecified: Secondary | ICD-10-CM

## 2019-10-26 DIAGNOSIS — I495 Sick sinus syndrome: Secondary | ICD-10-CM

## 2019-10-26 MED ORDER — LISINOPRIL 10 MG PO TABS: 10.0000 mg | ORAL_TABLET | Freq: Every day | ORAL | 3 refills | Status: DC

## 2019-10-26 NOTE — Patient Instructions (Signed)
Medication Instructions:  START lisinopril 10 mg daily  *If you need a refill on your cardiac medications before your next appointment, please call your pharmacy*   Lab Work: BMET at next appointment with pharmacist  If you have labs (blood work) drawn today and your tests are completely normal, you will receive your results only by: Marland Kitchen MyChart Message (if you have MyChart) OR . A paper copy in the mail If you have any lab test that is abnormal or we need to change your treatment, we will call you to review the results.   Testing/Procedures: MRA Aorta due in May 2022  Your physician has requested that you have en exercise stress myoview. For further information please visit [email or web address]. Please follow instruction sheet, as given.  Follow-Up: At [facility], you and your health needs are our priority.  As part of our continuing mission to provide you with exceptional heart care, we have created designated Provider Care Teams.  These Care Teams include your primary Cardiologist (physician) and Advanced Practice Providers (APPs -  Physician Assistants and Nurse Practitioners) who all work together to provide you with the care you need, when you need it.  We recommend signing up for the patient portal called "MyChart".  Sign up information is provided on this After Visit Summary.  MyChart is used to connect with patients for Virtual Visits (Telemedicine).  Patients are able to view lab/test results, encounter notes, upcoming appointments, etc.  Non-urgent messages can be sent to your provider as well.   To learn more about what you can do with MyChart, go to [email or web address].    Your next appointment:   2 weeks with pharmacist [facility] clinic) 6-8 weeks with Dr. Gardiner Rhyme  Other Instructions You have been referred to: Electrophysiologist at our [facility] office

## 2019-11-04 ENCOUNTER — Telehealth (HOSPITAL_COMMUNITY): Payer: Self-pay

## 2019-11-04 NOTE — Telephone Encounter (Signed)
Encounter complete. 

## 2019-11-08 ENCOUNTER — Ambulatory Visit: Payer: Medicare HMO | Admitting: Internal Medicine

## 2019-11-08 ENCOUNTER — Encounter: Payer: Self-pay | Admitting: Internal Medicine

## 2019-11-08 ENCOUNTER — Other Ambulatory Visit: Payer: Self-pay

## 2019-11-08 VITALS — BP 150/100 | HR 49 | Ht 71.0 in | Wt 174.6 lb

## 2019-11-08 DIAGNOSIS — I77819 Aortic ectasia, unspecified site: Secondary | ICD-10-CM | POA: Diagnosis not present

## 2019-11-08 DIAGNOSIS — I517 Cardiomegaly: Secondary | ICD-10-CM | POA: Diagnosis not present

## 2019-11-08 DIAGNOSIS — R079 Chest pain, unspecified: Secondary | ICD-10-CM | POA: Diagnosis not present

## 2019-11-08 DIAGNOSIS — I471 Supraventricular tachycardia: Secondary | ICD-10-CM | POA: Diagnosis not present

## 2019-11-08 NOTE — Patient Instructions (Addendum)
Medication Instructions:  Your physician recommends that you continue on your current medications as directed. Please refer to the Current Medication list given to you today.  *If you need a refill on your cardiac medications before your next appointment, please call your pharmacy*  Lab Work: None ordered.  If you have labs (blood work) drawn today and your tests are completely normal, you will receive your results only by: Marland Kitchen MyChart Message (if you have MyChart) OR . A paper copy in the mail If you have any lab test that is abnormal or we need to change your treatment, we will call you to review the results.  Testing/Procedures: None ordered.  Follow-Up: At [facility], you and your health needs are our priority.  As part of our continuing mission to provide you with exceptional heart care, we have created designated Provider Care Teams.  These Care Teams include your primary Cardiologist (physician) and Advanced Practice Providers (APPs -  Physician Assistants and Nurse Practitioners) who all work together to provide you with the care you need, when you need it.  We recommend signing up for the patient portal called "MyChart".  Sign up information is provided on this After Visit Summary.  MyChart is used to connect with patients for Virtual Visits (Telemedicine).  Patients are able to view lab/test results, encounter notes, upcoming appointments, etc.  Non-urgent messages can be sent to your provider as well.   To learn more about what you can do with MyChart, go to [email or web address].    Your next appointment:   Your physician wants you to follow-up in: 6 months with Dr. Lovena Le in the [place] office .    Other Instructions:

## 2019-11-08 NOTE — Progress Notes (Signed)
HPI William Avila is referred today by Dr. Gardiner Rhyme for evaluation of atrial tachycardia and sinus node dysfunction. He is a pleasant healthy 74 yo man who is very active. 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 He experiences episodes of "nervousness" which appear to correlate with HR's in the 140 range. He has worn a cardiac monitor which demonstrates SB and probable atrial tachycardia in the 130-140 range. He has not had syncope. When his HR is increase he feels nervous. No chest pain.  Allergies  Allergen Reactions  . Codeine Anaphylaxis  . Ibuprofen Anaphylaxis  . Robaxin [Methocarbamol] Anaphylaxis, Hives and Swelling     Current Outpatient Medications  Medication Sig Dispense Refill  . acetaminophen (TYLENOL) 500 MG tablet Take 500 mg by mouth every 6 (six) hours as needed for mild pain.    Marland Kitchen lisinopril (ZESTRIL) 10 MG tablet Take 1 tablet (10 mg total) by mouth daily. 30 tablet 3  . Multiple Vitamins-Minerals (MULTIVITAMIN WITH MINERALS) tablet Take 1 tablet by mouth daily.    . ondansetron (ZOFRAN ODT) 8 MG disintegrating tablet Take 1 tablet (8 mg total) by mouth every 8 (eight) hours as needed for nausea or vomiting. 20 tablet 0   No current facility-administered medications for this visit.     Past Medical History:  Diagnosis Date  . Acute appendicitis 01/21/2014  . Arthritis   . History of hiatal hernia     ROS:   All systems reviewed and negative except as noted in the HPI.   Past Surgical History:  Procedure Laterality Date  . BACK SURGERY     and neck and trigger finger surgery  . CERVICAL SPINE SURGERY    . ESOPHAGOGASTRODUODENOSCOPY (EGD) WITH PROPOFOL Left 12/24/2015   Procedure: ESOPHAGOGASTRODUODENOSCOPY (EGD) WITH PROPOFOL;  Surgeon: Otis Brace, MD;  Location: [place];  Service: Gastroenterology;  Laterality: Left;  . HIATAL HERNIA REPAIR N/A 12/26/2015   Procedure: LAPAROSCOPIC REPAIR OF HIATAL HERNIA WITH MESH;  Surgeon: Michael Boston, MD;  Location: [place];  Service: General;  Laterality: N/A;  . LAPAROSCOPIC APPENDECTOMY N/A 01/21/2014   Procedure: APPENDECTOMY LAPAROSCOPIC;  Surgeon: Autumn Messing III, MD;  Location: [place];  Service: General;  Laterality: N/A;  . TRIGGER FINGER RELEASE       No family history on file.   Social History   Socioeconomic History  . Marital status: Married    Spouse name: Not on file  . Number of children: Not on file  . Years of education: Not on file  . Highest education level: Not on file  Occupational History  . Not on file  Tobacco Use  . Smoking status: Former Smoker    Types: Cigarettes  . Smokeless tobacco: Never Used  Substance and Sexual Activity  . Alcohol use: Yes    Comment: Occassionally  . Drug use: No  . Sexual activity: Not on file  Other Topics Concern  . Not on file  Social History Narrative  . Not on file   Social Determinants of Health   Financial Resource Strain:   . Difficulty of Paying Living Expenses:   Food Insecurity:   . Worried About Charity fundraiser in the Last Year:   . Arboriculturist in the Last Year:   Transportation Needs:   . Film/video editor (Medical):   Marland Kitchen Lack of Transportation (Non-Medical):   Physical Activity:   . Days of Exercise per Week:   . Minutes  of Exercise per Session:   Stress:   . Feeling of Stress :   Social Connections:   . Frequency of Communication with Friends and Family:   . Frequency of Social Gatherings with Friends and Family:   . Attends Religious Services:   . Active Member of Clubs or Organizations:   . Attends Archivist Meetings:   Marland Kitchen Marital Status:   Intimate Partner Violence:   . Fear of Current or Ex-Partner:   . Emotionally Abused:   Marland Kitchen Physically Abused:   . Sexually Abused:      BP (!) 150/100   Pulse (!) 49   Ht 5\' 11"  (1.803 m)   Wt 174 lb 9.6 oz (79.2 kg)   SpO2 98%   BMI 24.35 kg/m   Physical Exam:  Well appearing NAD HEENT: Unremarkable Neck:  No  JVD, no thyromegally Lymphatics:  No adenopathy Back:  No CVA tenderness Lungs:  Clear with normal wheezes HEART:  Regular rate rhythm, no murmurs, no rubs, no clicks Abd:  soft, positive bowel sounds, no organomegally, no rebound, no guarding Ext:  2 plus pulses, no edema, no cyanosis, no clubbing Skin:  No rashes no nodules Neuro:  CN II through XII intact, motor grossly intact  EKG - sinus brady   Assess/Plan: 1. Sinus bradycardia - He is minimally if at all symptomatic. He will undergo watchful waiting. I discussed the symptoms he might experience if his sinus node dysfunction were to worsen. 2. Atrial tachycardia - he has HR's in the 130-140 range in AT. He gets a little jittery. We discussed medical threatment. I do not think he is symptomatic enough to recommend cathter ablation. AA drug therapy an option but likely to make his sinus node worse.  3. HTN - his bp is up today. Previously better. I reviewed the bp log that he brought in today.   William Avila.D.

## 2019-11-09 ENCOUNTER — Ambulatory Visit (HOSPITAL_COMMUNITY): Admission: RE | Admit: 2019-11-09 | Payer: Medicare HMO | Source: Ambulatory Visit

## 2019-11-15 ENCOUNTER — Telehealth (HOSPITAL_COMMUNITY): Payer: Self-pay

## 2019-11-15 NOTE — Telephone Encounter (Signed)
Encounter complete. 

## 2019-11-16 ENCOUNTER — Ambulatory Visit (HOSPITAL_COMMUNITY)
Admission: RE | Admit: 2019-11-16 | Discharge: 2019-11-16 | Disposition: A | Discharge status: Home / Self Care | Payer: Medicare HMO | Source: Ambulatory Visit | Attending: Cardiology | Admitting: Cardiology

## 2019-11-16 ENCOUNTER — Other Ambulatory Visit: Payer: Self-pay

## 2019-11-16 DIAGNOSIS — R079 Chest pain, unspecified: Secondary | ICD-10-CM

## 2019-11-16 LAB — MYOCARDIAL PERFUSION IMAGING
LV dias vol: 140 mL (ref 62–150)
LV sys vol: 62 mL
Peak HR: 77 {beats}/min
Rest HR: 115 {beats}/min
SDS: 3
SRS: 7
SSS: 10
TID: 1.04

## 2019-11-16 MED ORDER — REGADENOSON 0.4 MG/5ML IV SOLN
0.4000 mg | Freq: Once | INTRAVENOUS | Status: AC
  Administered 2019-11-16: 0.4 mg via INTRAVENOUS

## 2019-11-16 MED ORDER — TECHNETIUM TC 99M TETROFOSMIN IV KIT
10.8000 | PACK | Freq: Once | INTRAVENOUS | Status: AC | PRN
  Administered 2019-11-16: 10.8 via INTRAVENOUS
  Filled 2019-11-16: qty 11

## 2019-11-16 MED ORDER — TECHNETIUM TC 99M TETROFOSMIN IV KIT
32.1000 | PACK | Freq: Once | INTRAVENOUS | Status: AC | PRN
  Administered 2019-11-16: 32.1 via INTRAVENOUS
  Filled 2019-11-16: qty 33

## 2019-11-17 ENCOUNTER — Telehealth: Payer: Self-pay | Admitting: Cardiology

## 2019-11-17 NOTE — Telephone Encounter (Signed)
Left message for the pt to call back.

## 2019-11-17 NOTE — Telephone Encounter (Signed)
Pt called stated his wife told him William Avila called to give pt's is test results.  The system called back I tried to connect him.  Came back on the line pt stated forget it he does not care any more.

## 2019-11-17 NOTE — Telephone Encounter (Signed)
Pt given his stress results and he verbalized understanding and will keep his upcoming appt with the HTN clinic.

## 2019-11-17 NOTE — Telephone Encounter (Signed)
Follow Up ° °Patient is returning call. Please give patient a call back.  °

## 2019-11-23 NOTE — Telephone Encounter (Signed)
Spoke with patient, reviewed results of recent MRI and Myoview.

## 2019-11-29 DIAGNOSIS — R1904 Left lower quadrant abdominal swelling, mass and lump: Secondary | ICD-10-CM | POA: Diagnosis not present

## 2019-11-30 ENCOUNTER — Other Ambulatory Visit: Payer: Self-pay | Admitting: Internal Medicine

## 2019-11-30 DIAGNOSIS — R1904 Left lower quadrant abdominal swelling, mass and lump: Secondary | ICD-10-CM

## 2019-12-02 ENCOUNTER — Other Ambulatory Visit: Payer: Self-pay

## 2019-12-02 ENCOUNTER — Ambulatory Visit: Payer: Medicare HMO | Admitting: Pharmacist

## 2019-12-02 DIAGNOSIS — I10 Essential (primary) hypertension: Secondary | ICD-10-CM

## 2019-12-02 MED ORDER — LISINOPRIL 10 MG PO TABS
ORAL_TABLET | ORAL | 3 refills | Status: DC
Start: 2019-12-02 — End: 2019-12-28

## 2019-12-02 NOTE — Patient Instructions (Signed)
Return for a follow up appointment in 5 weeks with Dr. Gardiner Rhyme  Check your blood pressure at home daily (if able) and keep record of the readings.  Take your BP meds as follows: *INCREASE lisinopril 10mg  every morning and 5mg  every evening*  Bring all of your meds, your BP cuff and your record of home blood pressures to your next appointment.  Exercise as you're able, try to walk approximately 30 minutes per day.  Keep salt intake to a minimum, especially watch canned and prepared boxed foods.  Eat more fresh fruits and vegetables and fewer canned items.  Avoid eating in fast food restaurants.    HOW TO TAKE YOUR BLOOD PRESSURE: . Rest 5 minutes before taking your blood pressure. .  Don't smoke or drink caffeinated beverages for at least 30 minutes before. . Take your blood pressure before (not after) you eat. . Sit comfortably with your back supported and both feet on the floor (don't cross your legs). . Elevate your arm to heart level on a table or a desk. . Use the proper sized cuff. It should fit smoothly and snugly around your bare upper arm. There should be enough room to slip a fingertip under the cuff. The bottom edge of the cuff should be 1 inch above the crease of the elbow. . Ideally, take 3 measurements at one sitting and record the average.

## 2019-12-02 NOTE — Progress Notes (Signed)
Patient ID: William Avila                 DOB: [birth year]                      MRN: [id number]     HPI:  William Avila is a 74 y.o. male referred by Dr. Su Grand to HTN clinic. PMH includes SVT, sinus node dysfunction, aortic dilation, moderate LV hypertrophy, and hypertension. ECHO performed 06/2019 shows EF of 65-70%.   Patient denies dizziness, SOB, swelling or blurry vision. Feels chest pressure some whne BP is elevated but no the symptoms. Is currently undergoing f/u and treatment for henia and recurrent pain. Awaiting visit with surgeon to schedule surgery ASAP.  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  Home BP readings:  17 readings; average 143/85; HR range 44-56bpm ReliOn 74 years old - accurate within 43mmHg from manual BP in office  Wt Readings from Last 3 Encounters:  12/02/19 174 lb (78.9 kg)  11/16/19 174 lb (78.9 kg)  11/08/19 174 lb 9.6 oz (79.2 kg)   BP Readings from Last 3 Encounters:  12/02/19 (!) 158/88  11/08/19 (!) 150/100  10/26/19 140/80   Pulse Readings from Last 3 Encounters:  12/02/19 (!) 48  11/08/19 (!) 49  10/26/19 87     Past Medical History:  Diagnosis Date   Acute appendicitis 01/21/2014   Arthritis    History of hiatal hernia     Current Outpatient Medications on File Prior to Visit  Medication Sig Dispense Refill   acetaminophen (TYLENOL) 500 MG tablet Take 500 mg by mouth every 6 (six) hours as needed for mild pain.     Multiple Vitamins-Minerals (MULTIVITAMIN WITH MINERALS) tablet Take 1 tablet by mouth daily.     No current facility-administered medications on file prior to  visit.    Allergies  Allergen Reactions   Codeine Anaphylaxis   Ibuprofen Anaphylaxis   Robaxin [Methocarbamol] Anaphylaxis, Hives and Swelling    Blood pressure (!) 158/88, pulse (!) 48, height 5\' 11"  (1.803 m), weight 174 lb (78.9 kg), SpO2 98 %.  Hypertension Blood pressure remains above goal after initiating lisinopril. Noted baseline HR if between 40-50s beats per minutes with occasional SVTs. Need to avoid BB and CCB d/t sinus node dysfunction.   Will increase lisinopril to 10mg  every morning and 5mg  every evening and follow up in 5 weeks. May need to target higher BP goal of 140/90 to avoid hypotensive symptoms when HR in the 40s   Brynnan Rodenbaugh Rodriguez-Guzman PharmD, BCPS, [place] [address] [place] [address] 12/05/2019 4:39 PM

## 2019-12-05 ENCOUNTER — Encounter: Payer: Self-pay | Admitting: Pharmacist

## 2019-12-05 ENCOUNTER — Ambulatory Visit
Admission: RE | Admit: 2019-12-05 | Discharge: 2019-12-05 | Disposition: A | Discharge status: Home / Self Care | Payer: Medicare HMO | Source: Ambulatory Visit | Attending: Internal Medicine | Admitting: Internal Medicine

## 2019-12-05 ENCOUNTER — Other Ambulatory Visit: Payer: Self-pay

## 2019-12-05 ENCOUNTER — Other Ambulatory Visit: Payer: Self-pay | Admitting: Internal Medicine

## 2019-12-05 DIAGNOSIS — K4090 Unilateral inguinal hernia, without obstruction or gangrene, not specified as recurrent: Secondary | ICD-10-CM | POA: Diagnosis not present

## 2019-12-05 DIAGNOSIS — K5750 Diverticulosis of both small and large intestine without perforation or abscess without bleeding: Secondary | ICD-10-CM | POA: Diagnosis not present

## 2019-12-05 DIAGNOSIS — I700 Atherosclerosis of aorta: Secondary | ICD-10-CM | POA: Diagnosis not present

## 2019-12-05 DIAGNOSIS — R1904 Left lower quadrant abdominal swelling, mass and lump: Secondary | ICD-10-CM

## 2019-12-05 DIAGNOSIS — I10 Essential (primary) hypertension: Secondary | ICD-10-CM | POA: Insufficient documentation

## 2019-12-05 DIAGNOSIS — N2889 Other specified disorders of kidney and ureter: Secondary | ICD-10-CM | POA: Diagnosis not present

## 2019-12-05 NOTE — Assessment & Plan Note (Signed)
Blood pressure remains above goal after initiating lisinopril. Noted baseline HR if between 40-50s beats per minutes with occasional SVTs. Need to avoid BB and CCB d/t sinus node dysfunction.   Will increase lisinopril to 10mg  every morning and 5mg  every evening and follow up in 5 weeks. May need to target higher BP goal of 140/90 to avoid hypotensive symptoms when HR in the 40s

## 2019-12-06 ENCOUNTER — Institutional Professional Consult (permissible substitution): Payer: Medicare HMO | Admitting: Internal Medicine

## 2019-12-06 ENCOUNTER — Other Ambulatory Visit: Payer: Self-pay | Admitting: Physician Assistant

## 2019-12-06 DIAGNOSIS — R1904 Left lower quadrant abdominal swelling, mass and lump: Secondary | ICD-10-CM

## 2019-12-12 ENCOUNTER — Other Ambulatory Visit: Payer: Medicare HMO

## 2019-12-19 ENCOUNTER — Other Ambulatory Visit: Payer: Self-pay | Admitting: General Surgery

## 2019-12-19 DIAGNOSIS — I471 Supraventricular tachycardia: Secondary | ICD-10-CM | POA: Diagnosis not present

## 2019-12-19 DIAGNOSIS — R001 Bradycardia, unspecified: Secondary | ICD-10-CM | POA: Diagnosis not present

## 2019-12-19 DIAGNOSIS — R1900 Intra-abdominal and pelvic swelling, mass and lump, unspecified site: Secondary | ICD-10-CM | POA: Diagnosis not present

## 2019-12-19 DIAGNOSIS — K4090 Unilateral inguinal hernia, without obstruction or gangrene, not specified as recurrent: Secondary | ICD-10-CM | POA: Diagnosis not present

## 2019-12-27 ENCOUNTER — Other Ambulatory Visit: Payer: Self-pay | Admitting: Cardiology

## 2019-12-28 ENCOUNTER — Telehealth: Payer: Self-pay | Admitting: Cardiology

## 2019-12-28 ENCOUNTER — Other Ambulatory Visit: Payer: Self-pay

## 2019-12-28 MED ORDER — LISINOPRIL 10 MG PO TABS: ORAL_TABLET | ORAL | 3 refills | Status: DC

## 2019-12-28 NOTE — Telephone Encounter (Signed)
*  STAT* If patient is at the pharmacy, call can be transferred to refill team.   1. Which medications need to be refilled? (please list name of each medication and dose if known) lisinopril (ZESTRIL) 10 MG tablet  2. Which pharmacy/location (including street and city if local pharmacy) is medication to be sent to? [id number] - [address], [place]  3. Do they need a 30 day or 90 day supply? 30 day supply

## 2019-12-30 ENCOUNTER — Other Ambulatory Visit: Payer: Self-pay

## 2019-12-30 MED ORDER — LISINOPRIL 10 MG PO TABS: ORAL_TABLET | ORAL | 3 refills | Status: DC

## 2019-12-30 NOTE — Telephone Encounter (Signed)
Called and spoke with pt, notified that his prescription had been sent and a confirmation receipt was given by the pharmacy. Pt verbalized understanding with no other questions at this time.

## 2019-12-30 NOTE — Telephone Encounter (Signed)
Follow up:    Patient to check the status of his refill. Patient called 2 days ago. Please call patient.

## 2020-01-06 NOTE — Progress Notes (Signed)
Cardiology Office Note:    Date:  01/09/2020   ID:  William Avila, DOB 18-Jan-1946, MRN [id number]  PCP:  [name], MD  Cardiologist:  No primary care provider on file.  Electrophysiologist:  None   Referring MD: [name], MD   Chief Complaint  Patient presents with  . Irregular Heart Beat    History of Present Illness:    William Avila is a 74 y.o. male with a hx of arthritis who presents for follow-up. He was seen in the ED on 05/30/2019 after having a surgical procedure on his arm and having runs of tachycardia following this.Telemetry in the ED showed frequent episodes of narrow complex tachycardia, with rates up to 150s. Appeared to be ectopic P wave during episodes suggesting atrial tachycardia as the cause. He was discharged on metoprolol but did not take.  TTE on 07/06/2019 showed EF 65 to 70%, moderate LVH, grade 1 diastolic dysfunction, normal RV function, moderate dilatation of the ascending aorta measuring 44 mm.  CMR with MRA was recommended to work-up LVH and aortic dilatation.    CMR on 09/14/2019 showed mild asymmetric hypertrophy measuring 13 mm and basal septum not meeting criteria for HCM, no LGE, LV EF 61%, RVEF 63%, dilated ascending aorta measuring 41 mm.  Zio patch x14 days showed 485 episodes of SVT, longest lasting nearly 5 minutes, occasional PVCs (2% of beats).  Reported exertional chest pain, Lexiscan Myoview was done on 11/16/2019 which showed no ischemia, attenuation artifact, LVEF 56%.  Since last clinic visit,  he reports that he has been doing OK.  Planning surgery on 9/30 to remove retroperitoneal mass as well as hernia repair.  He continues to have occasional chest pain.  Denies any exertional chest pain or dyspnea.  Reports he walks up and down flight of stairs in his home without any issues.  Walks his dog 0.25 to 0.5 miles each day.  He denies any palpitations or lightheadedness.  Brought BP log with him, has been 108 to 157 over 70s to  90s.  Feels that elevated BP has occurred when he is having pain from his hernia.   Past Medical History:  Diagnosis Date  . Acute appendicitis 01/21/2014  . Arthritis   . History of hiatal hernia     Past Surgical History:  Procedure Laterality Date  . BACK SURGERY     and neck and trigger finger surgery  . CERVICAL SPINE SURGERY    . ESOPHAGOGASTRODUODENOSCOPY (EGD) WITH PROPOFOL Left 12/24/2015   Procedure: ESOPHAGOGASTRODUODENOSCOPY (EGD) WITH PROPOFOL;  Surgeon: Otis Brace, MD;  Location: [facility];  Service: Gastroenterology;  Laterality: Left;  . HIATAL HERNIA REPAIR N/A 12/26/2015   Procedure: LAPAROSCOPIC REPAIR OF HIATAL HERNIA WITH MESH;  Surgeon: Michael Boston, MD;  Location: [facility];  Service: General;  Laterality: N/A;  . LAPAROSCOPIC APPENDECTOMY N/A 01/21/2014   Procedure: APPENDECTOMY LAPAROSCOPIC;  Surgeon: Autumn Messing III, MD;  Location: [facility];  Service: General;  Laterality: N/A;  . TRIGGER FINGER RELEASE      Current Medications: Current Meds  Medication Sig  . acetaminophen (TYLENOL) 500 MG tablet Take 500 mg by mouth every 6 (six) hours as needed for mild pain.   . hydroxypropyl methylcellulose / hypromellose (ISOPTO TEARS / GONIOVISC) 2.5 % ophthalmic solution Place 1 drop into both eyes 3 (three) times daily as needed for dry eyes.  Marland Kitchen lisinopril (ZESTRIL) 10 MG tablet Take 1 tablet (10 mg total) by mouth in the morning AND 0.5 tablets (  5 mg total) every evening.  . Multiple Vitamins-Minerals (MULTIVITAMIN WITH MINERALS) tablet Take 1 tablet by mouth daily.     Allergies:   Codeine, Ibuprofen, Robaxin [methocarbamol], Fentanyl and related, and Tramadol hcl   Social History   Socioeconomic History  . Marital status: Married    Spouse name: Not on file  . Number of children: Not on file  . Years of education: Not on file  . Highest education level: Not on file  Occupational History  . Not on file  Tobacco Use  . Smoking status: Former Smoker     Types: Cigarettes  . Smokeless tobacco: Never Used  Substance and Sexual Activity  . Alcohol use: Yes    Comment: Occassionally  . Drug use: No  . Sexual activity: Not on file  Other Topics Concern  . Not on file  Social History Narrative  . Not on file   Social Determinants of Health   Financial Resource Strain:   . Difficulty of Paying Living Expenses: Not on file  Food Insecurity:   . Worried About Charity fundraiser in the Last Year: Not on file  . Ran Out of Food in the Last Year: Not on file  Transportation Needs:   . Lack of Transportation (Medical): Not on file  . Lack of Transportation (Non-Medical): Not on file  Physical Activity:   . Days of Exercise per Week: Not on file  . Minutes of Exercise per Session: Not on file  Stress:   . Feeling of Stress : Not on file  Social Connections:   . Frequency of Communication with Friends and Family: Not on file  . Frequency of Social Gatherings with Friends and Family: Not on file  . Attends Religious Services: Not on file  . Active Member of Clubs or Organizations: Not on file  . Attends Archivist Meetings: Not on file  . Marital Status: Not on file     Family History: No relevant family history   ROS:   Please see the history of present illness.    All other systems reviewed and are negative.  EKGs/Labs/Other Studies Reviewed:    The following studies were reviewed today:   EKG:  EKG is  ordered today.  The ekg ordered today demonstrates sinus rhythm, rate 53, no ST/T abnormalities  Recent Labs: 05/30/2019: Hemoglobin 16.1; Platelets 265; TSH 0.584 06/23/2019: Magnesium 2.3 01/09/2020: BUN 9; Creatinine, Ser 0.95; Potassium 4.7; Sodium 136  Recent Lipid Panel No results found for: CHOL, TRIG, HDL, CHOLHDL, VLDL, LDLCALC, LDLDIRECT   Lexiscan Myoview 06/09/18:  Nuclear stress EF: 62%.  No T wave inversion was noted during stress.  There was no ST segment deviation noted during stress.  This is  a low risk study.   No significant reversible ischemia. LVEF 62% with normal wall motion. This is a low risk study.  Physical Exam:    VS:  BP (!) 154/102   Pulse (!) 53   Ht 5\' 11"  (1.803 m)   Wt 173 lb (78.5 kg)   SpO2 98%   BMI 24.13 kg/m     Wt Readings from Last 3 Encounters:  01/09/20 173 lb (78.5 kg)  12/02/19 174 lb (78.9 kg)  11/16/19 174 lb (78.9 kg)     GEN:  Well nourished, well developed in no acute distress HEENT: Normal NECK: No JVD CARDIAC: Bradycardic, regular rhythm, 2/6 systolic heart murmur RESPIRATORY:  Clear to auscultation without rales, wheezing or rhonchi  ABDOMEN: Soft, non-tender,  non-distended MUSCULOSKELETAL:  No edema; No deformity  SKIN: Warm and dry NEUROLOGIC:  Alert and oriented x 3 PSYCHIATRIC:  Normal affect   ASSESSMENT:    1. Pre-op evaluation   2. SVT (supraventricular tachycardia) ([place])   3. LVH (left ventricular hypertrophy)   4. Essential hypertension   5. Chest pain of uncertain etiology    PLAN:    Pre-op evaluation: Prior to retroperitoneal mass resection and hernia repair.  Has had chest pain recently, but Lexiscan Myoview on 11/16/2019 showed no ischemia.  No further cardiac work-up recommended prior to surgery.  KPT:WSFKCLEX episodes on recent monitor.  Unable to tolerate low dose metoprolol as resting heart rate in 40s to 50s.  Does report some lightheadedness with SVT episodes with heart rate up to 140s.  Referred to EP, seen by Dr. Lovena Le, planning to monitor for now.  LVH: CMR on 09/14/2019 showed mild asymmetric hypertrophy measuring 13 mm and basal septum not meeting criteria for HCM, no LGE  Aortic dilatation: dilated ascending aorta measuring 41 mm on MRA.  Will repeat MRA in 1 year for monitoring  Chest pain: Negative stress test 05/2018. Reported exertional chest pain, Lexiscan Myoview was done on 11/16/2019 which showed no ischemia, attenuation artifact, LVEF 56%.  Hypertension: On lisinopril 10 mg every  morning/5 mg nightly.  Intermittently elevated BP, but appears to be related to pain related to hernia.  Will reevaluate following surgery.  RTC in 6 months  Medication Adjustments/Labs and Tests Ordered: Current medicines are reviewed at length with the patient today.  Concerns regarding medicines are outlined above.  Orders Placed This Encounter  Procedures  . Basic metabolic panel  . EKG 12-Lead   No orders of the defined types were placed in this encounter.   Patient Instructions  Medication Instructions:  Your physician recommends that you continue on your current medications as directed. Please refer to the Current Medication list given to you today.  *If you need a refill on your cardiac medications before your next appointment, please call your pharmacy*  Lab Work: BMET today  If you have labs (blood work) drawn today and your tests are completely normal, you will receive your results only by: Marland Kitchen MyChart Message (if you have MyChart) OR . A paper copy in the mail If you have any lab test that is abnormal or we need to change your treatment, we will call you to review the results.  Follow-Up: At [facility], you and your health needs are our priority.  As part of our continuing mission to provide you with exceptional heart care, we have created designated Provider Care Teams.  These Care Teams include your primary Cardiologist (physician) and Advanced Practice Providers (APPs -  Physician Assistants and Nurse Practitioners) who all work together to provide you with the care you need, when you need it.  We recommend signing up for the patient portal called "MyChart".  Sign up information is provided on this After Visit Summary.  MyChart is used to connect with patients for Virtual Visits (Telemedicine).  Patients are able to view lab/test results, encounter notes, upcoming appointments, etc.  Non-urgent messages can be sent to your provider as well.   To learn more about what  you can do with MyChart, go to [email or web address].    Your next appointment:   6 month(s)  The format for your next appointment:   In Person  Provider:   Oswaldo Milian, MD        Signed, Donato Heinz,  MD  01/09/2020 10:29 PM    [place]

## 2020-01-09 ENCOUNTER — Other Ambulatory Visit: Payer: Self-pay

## 2020-01-09 ENCOUNTER — Ambulatory Visit: Payer: Medicare HMO | Admitting: Cardiology

## 2020-01-09 ENCOUNTER — Encounter: Payer: Self-pay | Admitting: Cardiology

## 2020-01-09 VITALS — BP 154/102 | HR 53 | Ht 71.0 in | Wt 173.0 lb

## 2020-01-09 DIAGNOSIS — Z01818 Encounter for other preprocedural examination: Secondary | ICD-10-CM

## 2020-01-09 DIAGNOSIS — I517 Cardiomegaly: Secondary | ICD-10-CM | POA: Diagnosis not present

## 2020-01-09 DIAGNOSIS — R079 Chest pain, unspecified: Secondary | ICD-10-CM | POA: Diagnosis not present

## 2020-01-09 DIAGNOSIS — I471 Supraventricular tachycardia: Secondary | ICD-10-CM | POA: Diagnosis not present

## 2020-01-09 DIAGNOSIS — I10 Essential (primary) hypertension: Secondary | ICD-10-CM

## 2020-01-09 LAB — BASIC METABOLIC PANEL
BUN/Creatinine Ratio: 9 — ABNORMAL LOW (ref 10–24)
BUN: 9 mg/dL (ref 8–27)
CO2: 26 mmol/L (ref 20–29)
Calcium: 9.9 mg/dL (ref 8.6–10.2)
Chloride: 98 mmol/L (ref 96–106)
Creatinine, Ser: 0.95 mg/dL (ref 0.76–1.27)
GFR calc Af Amer: 91 mL/min/{1.73_m2} (ref >59)
GFR calc non Af Amer: 79 mL/min/{1.73_m2} (ref >59)
Glucose: 104 mg/dL — ABNORMAL HIGH (ref 65–99)
Potassium: 4.7 mmol/L (ref 3.5–5.2)
Sodium: 136 mmol/L (ref 134–144)

## 2020-01-09 NOTE — Patient Instructions (Signed)
Medication Instructions:  Your physician recommends that you continue on your current medications as directed. Please refer to the Current Medication list given to you today.  *If you need a refill on your cardiac medications before your next appointment, please call your pharmacy*  Lab Work: BMET today  If you have labs (blood work) drawn today and your tests are completely normal, you will receive your results only by:  Edith Endave (if you have MyChart) OR  A paper copy in the mail If you have any lab test that is abnormal or we need to change your treatment, we will call you to review the results.  Follow-Up: At [facility], you and your health needs are our priority.  As part of our continuing mission to provide you with exceptional heart care, we have created designated Provider Care Teams.  These Care Teams include your primary Cardiologist (physician) and Advanced Practice Providers (APPs -  Physician Assistants and Nurse Practitioners) who all work together to provide you with the care you need, when you need it.  We recommend signing up for the patient portal called "MyChart".  Sign up information is provided on this After Visit Summary.  MyChart is used to connect with patients for Virtual Visits (Telemedicine).  Patients are able to view lab/test results, encounter notes, upcoming appointments, etc.  Non-urgent messages can be sent to your provider as well.   To learn more about what you can do with MyChart, go to [email or web address].    Your next appointment:   6 month(s)  The format for your next appointment:   In Person  Provider:   Oswaldo Milian, MD

## 2020-01-12 ENCOUNTER — Inpatient Hospital Stay (HOSPITAL_COMMUNITY): Admission: RE | Admit: 2020-01-12 | Payer: Medicare HMO | Source: Ambulatory Visit

## 2020-01-13 ENCOUNTER — Other Ambulatory Visit (HOSPITAL_COMMUNITY): Payer: Medicare HMO

## 2020-01-13 NOTE — Progress Notes (Signed)
[id number] [address], [place] [address] Phone: [phone] Fax: [phone]  [place], [place] [address] Phone: [phone] Fax: [place], [place] [address] [place] [address] Phone: [phone] Fax: [phone]      Your procedure is scheduled on Thursday, September 30th.  Report to [facility] Main Entrance "A" at 5:30 A.M., and check in at the Admitting office.  Call this number if you have problems the morning of surgery:  [phone]  Call [phone] if you have any questions prior to your surgery date Monday-Friday 8am-4pm    Remember:  Do not eat after midnight the night before your surgery  You may drink clear liquids until 4:30 AM the morning of your surgery.   Clear liquids allowed are: Water, Non-Citrus Juices (without pulp), Carbonated Beverages, Clear Tea, Black Coffee Only, and Gatorade  The night before surgery: Drink (2) Pre-surgery Ensure drinks.  The day of surgery: Finish (1) Pre-surgery Ensure drink by 4:30 AM.  Drink all in one sitting.  Do not sip. Nothing else to drink after you finish the Ensure.       Take these medicines the morning of surgery with A SIP OF WATER   Tylenol - if needed  Eye drops - if needed  As of today, STOP taking any Aspirin (unless otherwise instructed by your surgeon) Aleve, Naproxen, Ibuprofen, Motrin, Advil, Goody's, BC's, all herbal medications, fish oil, and all vitamins.                      Do not wear jewelry            Do not wear lotions, powders, colognes, or deodorant.            Men may shave face and neck.            Do not bring valuables to the hospital.            [facility] is not responsible for any belongings or valuables.  Do NOT Smoke  (Tobacco/Vaping) or drink Alcohol 24 hours prior to your procedure If you use a CPAP at night, you may bring all equipment for your overnight stay.   Contacts, glasses, dentures or bridgework may not be worn into surgery.      For patients admitted to the hospital, discharge time will be determined by your treatment team.   Patients discharged the day of surgery will not be allowed to drive home, and someone needs to stay with them for 24 hours.    Special instructions:   [facility]- Preparing For Surgery  Before surgery, you can play an important role. Because skin is not sterile, your skin needs to be as free of germs as possible. You can reduce the number of germs on your skin by washing with CHG (chlorahexidine gluconate) Soap before surgery.  CHG is an antiseptic cleaner which kills germs and bonds with the skin to continue killing germs even after washing.    Oral Hygiene is also important to reduce your risk of infection.  Remember - BRUSH YOUR TEETH THE MORNING OF SURGERY WITH YOUR REGULAR TOOTHPASTE  Please do not use if you have an allergy to CHG or antibacterial soaps. If  your skin becomes reddened/irritated stop using the CHG.  Do not shave (including legs and underarms) for at least 48 hours prior to first CHG shower. It is OK to shave your face.  Please follow these instructions carefully.   1. Shower the NIGHT BEFORE SURGERY and the MORNING OF SURGERY with CHG Soap.   2. If you chose to wash your hair, wash your hair first as usual with your normal shampoo.  3. After you shampoo, rinse your hair and body thoroughly to remove the shampoo.  4. Use CHG as you would any other liquid soap. You can apply CHG directly to the skin and wash gently with a scrungie or a clean washcloth.   5. Apply the CHG Soap to your body ONLY FROM THE NECK DOWN.  Do not use on open wounds or open sores. Avoid contact with your eyes, ears, mouth and genitals (private parts). Wash Face and  genitals (private parts)  with your normal soap.   6. Wash thoroughly, paying special attention to the area where your surgery will be performed.  7. Thoroughly rinse your body with warm water from the neck down.  8. DO NOT shower/wash with your normal soap after using and rinsing off the CHG Soap.  9. Pat yourself dry with a CLEAN TOWEL.  10. Wear CLEAN PAJAMAS to bed the night before surgery  11. Place CLEAN SHEETS on your bed the night of your first shower and DO NOT SLEEP WITH PETS.   Day of Surgery: Wear Clean/Comfortable clothing the morning of surgery Do not apply any deodorants/lotions.   Remember to brush your teeth WITH YOUR REGULAR TOOTHPASTE.   Please read over the following fact sheets that you were given.

## 2020-01-16 ENCOUNTER — Encounter (HOSPITAL_COMMUNITY): Payer: Self-pay

## 2020-01-16 ENCOUNTER — Other Ambulatory Visit (HOSPITAL_COMMUNITY)
Admission: RE | Admit: 2020-01-16 | Discharge: 2020-01-16 | Disposition: A | Discharge status: Home / Self Care | Payer: Medicare HMO | Source: Ambulatory Visit

## 2020-01-16 ENCOUNTER — Other Ambulatory Visit: Payer: Self-pay

## 2020-01-16 ENCOUNTER — Encounter (HOSPITAL_COMMUNITY)
Admission: RE | Admit: 2020-01-16 | Discharge: 2020-01-16 | Disposition: A | Discharge status: Home / Self Care | Payer: Medicare HMO | Source: Ambulatory Visit | Attending: General Surgery | Admitting: General Surgery

## 2020-01-16 DIAGNOSIS — Z79899 Other long term (current) drug therapy: Secondary | ICD-10-CM | POA: Insufficient documentation

## 2020-01-16 DIAGNOSIS — K4090 Unilateral inguinal hernia, without obstruction or gangrene, not specified as recurrent: Secondary | ICD-10-CM | POA: Insufficient documentation

## 2020-01-16 DIAGNOSIS — Z01812 Encounter for preprocedural laboratory examination: Secondary | ICD-10-CM | POA: Insufficient documentation

## 2020-01-16 DIAGNOSIS — I10 Essential (primary) hypertension: Secondary | ICD-10-CM | POA: Insufficient documentation

## 2020-01-16 DIAGNOSIS — I471 Supraventricular tachycardia: Secondary | ICD-10-CM | POA: Insufficient documentation

## 2020-01-16 DIAGNOSIS — I700 Atherosclerosis of aorta: Secondary | ICD-10-CM | POA: Insufficient documentation

## 2020-01-16 DIAGNOSIS — I472 Ventricular tachycardia: Secondary | ICD-10-CM | POA: Insufficient documentation

## 2020-01-16 DIAGNOSIS — Z20822 Contact with and (suspected) exposure to covid-19: Secondary | ICD-10-CM | POA: Insufficient documentation

## 2020-01-16 DIAGNOSIS — R001 Bradycardia, unspecified: Secondary | ICD-10-CM | POA: Insufficient documentation

## 2020-01-16 HISTORY — DX: Cardiac arrhythmia, unspecified: I49.9

## 2020-01-16 HISTORY — DX: Essential (primary) hypertension: I10

## 2020-01-16 LAB — CBC WITH DIFFERENTIAL/PLATELET
Abs Immature Granulocytes: 0.06 10*3/uL (ref 0.00–0.07)
Basophils Absolute: 0.1 10*3/uL (ref 0.0–0.1)
Basophils Relative: 1 %
Eosinophils Absolute: 0.1 10*3/uL (ref 0.0–0.5)
Eosinophils Relative: 2 %
HCT: 45.3 % (ref 39.0–52.0)
Hemoglobin: 15.1 g/dL (ref 13.0–17.0)
Immature Granulocytes: 1 %
Lymphocytes Relative: 21 %
Lymphs Abs: 1.4 10*3/uL (ref 0.7–4.0)
MCH: 32.5 pg (ref 26.0–34.0)
MCHC: 33.3 g/dL (ref 30.0–36.0)
MCV: 97.6 fL (ref 80.0–100.0)
Monocytes Absolute: 0.6 10*3/uL (ref 0.1–1.0)
Monocytes Relative: 9 %
Neutro Abs: 4.4 10*3/uL (ref 1.7–7.7)
Neutrophils Relative %: 66 %
Platelets: 246 10*3/uL (ref 150–400)
RBC: 4.64 MIL/uL (ref 4.22–5.81)
RDW: 12.5 % (ref 11.5–15.5)
WBC: 6.7 10*3/uL (ref 4.0–10.5)
nRBC: 0 % (ref 0.0–0.2)

## 2020-01-16 LAB — URINALYSIS, ROUTINE W REFLEX MICROSCOPIC
Bacteria, UA: NONE SEEN
Bilirubin Urine: NEGATIVE
Glucose, UA: NEGATIVE mg/dL
Hgb urine dipstick: NEGATIVE
Ketones, ur: NEGATIVE mg/dL
Nitrite: NEGATIVE
Protein, ur: NEGATIVE mg/dL
Specific Gravity, Urine: 1.005 (ref 1.005–1.030)
pH: 7 (ref 5.0–8.0)

## 2020-01-16 LAB — COMPREHENSIVE METABOLIC PANEL
ALT: 22 U/L (ref 0–44)
AST: 26 U/L (ref 15–41)
Albumin: 4.1 g/dL (ref 3.5–5.0)
Alkaline Phosphatase: 112 U/L (ref 38–126)
Anion gap: 9 (ref 5–15)
BUN: 8 mg/dL (ref 8–23)
CO2: 26 mmol/L (ref 22–32)
Calcium: 9.4 mg/dL (ref 8.9–10.3)
Chloride: 102 mmol/L (ref 98–111)
Creatinine, Ser: 0.97 mg/dL (ref 0.61–1.24)
GFR calc Af Amer: >60 mL/min (ref >60)
GFR calc non Af Amer: >60 mL/min (ref >60)
Glucose, Bld: 106 mg/dL — ABNORMAL HIGH (ref 70–99)
Potassium: 4.1 mmol/L (ref 3.5–5.1)
Sodium: 137 mmol/L (ref 135–145)
Total Bilirubin: 0.8 mg/dL (ref 0.3–1.2)
Total Protein: 6.7 g/dL (ref 6.5–8.1)

## 2020-01-16 LAB — SARS CORONAVIRUS 2 (TAT 6-24 HRS): SARS Coronavirus 2: NEGATIVE

## 2020-01-16 LAB — PROTIME-INR
INR: 1 (ref 0.8–1.2)
Prothrombin Time: 12.3 seconds (ref 11.4–15.2)

## 2020-01-16 LAB — PREPARE RBC (CROSSMATCH)

## 2020-01-16 NOTE — Progress Notes (Signed)
PCP - Dr. Laurann Montana Cardiologist - Dr. Gardiner Rhyme  PPM/ICD - n/a Device Orders -  Rep Notified -   Chest x-ray - 05/30/2019 EKG - 01/09/2020 Stress Test - 11/16/2019 ECHO - 07/06/2019 Cardiac Cath - n/a  Sleep Study - n/a CPAP -   Fasting Blood Sugar - n/a Checks Blood Sugar _____ times a day  Blood Thinner Instructions: n/a Aspirin Instructions:n/a  ERAS Protcol - clears until 0430 PRE-SURGERY Ensure or G2- 2 night before surgery, 1 morning of surgery  COVID TEST- 01/16/2020 before PAT appointment   Anesthesia review: yes, recent cardiac testing  Patient denies shortness of breath, fever, cough and chest pain at PAT appointment   All instructions explained to the patient, with a verbal understanding of the material. Patient agrees to go over the instructions while at home for a better understanding. Patient also instructed to self quarantine after being tested for COVID-19. The opportunity to ask questions was provided.

## 2020-01-17 ENCOUNTER — Encounter (HOSPITAL_COMMUNITY): Payer: Self-pay

## 2020-01-17 NOTE — Anesthesia Preprocedure Evaluation (Addendum)
Anesthesia Evaluation  Patient identified by MRN, date of birth, ID band Patient awake    Reviewed: Allergy & Precautions, NPO status , Patient's Chart, lab work & pertinent test results  Airway Mallampati: II  TM Distance: >3 FB Neck ROM: Full    Dental  (+) Poor Dentition, Caps, Dental Advisory Given, Missing   Pulmonary neg pulmonary ROS,    Pulmonary exam normal breath sounds clear to auscultation       Cardiovascular hypertension, Pt. on medications Normal cardiovascular exam+ dysrhythmias Supra Ventricular Tachycardia  Rhythm:Regular Rate:Normal     Neuro/Psych negative neurological ROS  negative psych ROS   GI/Hepatic Neg liver ROS, hiatal hernia,   Endo/Other  negative endocrine ROS  Renal/GU negative Renal ROS  negative genitourinary   Musculoskeletal  (+) Arthritis , Osteoarthritis,  Right retroperitoneal mass Left inguinal hernia   Abdominal   Peds  Hematology negative hematology ROS (+)   Anesthesia Other Findings   Reproductive/Obstetrics                         Anesthesia Physical Anesthesia Plan  ASA: II  Anesthesia Plan: General   Post-op Pain Management:    Induction: Intravenous  PONV Risk Score and Plan: 4 or greater and Ondansetron and Treatment may vary due to age or medical condition  Airway Management Planned: Oral ETT  Additional Equipment:   Intra-op Plan:   Post-operative Plan: Extubation in OR  Informed Consent: I have reviewed the patients History and Physical, chart, labs and discussed the procedure including the risks, benefits and alternatives for the proposed anesthesia with the patient or authorized representative who has indicated his/her understanding and acceptance.     Dental advisory given  Plan Discussed with: CRNA and Anesthesiologist  Anesthesia Plan Comments: (PAT note written 01/17/2020 by Myra Gianotti, PA-C. )       Anesthesia Quick Evaluation

## 2020-01-17 NOTE — Progress Notes (Signed)
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  History includes never smoker, HTN, dysrhythmia (PSVT, bradycardia), hiatal hernia - ED visit 05/30/19 after he developed SVT/narrow complex tachycardia up to 156 bpm at a local surgical center following left thumb surgery. Cardiologist Dr. Gardiner Rhyme consulted. He was discharged on low dose b-blocker (declined) and out-patient follow-up with ZioPatch which showed 485 episodes of SVT. Over the next few months, echo showed normal LVEF, moderate LVH, 44 mm ascending aorta, cardiac MRI findings did not meet criteria for HCM, and Myoview was non-ischemic.    Preoperative evaluation on 01/09/20 by Dr. Gardiner Rhyme: "Pre-op evaluation: Prior to retroperitoneal mass resection and hernia repair.  Has had chest pain recently, but Lexiscan Myoview on 11/16/2019 showed no ischemia.  No further cardiac work-up recommended prior to surgery."  He will continue to monitor SVT--patient unable to tolerate low dose metoprolol as resting heart rate in 40s to 50s. He will follow-up dilated ascending aorta in one year. He has had 2 negative stress tests for chest pain evaluations (05/2018, 10/2019).   01/16/2020 presurgical COVID-19 test negative.  Anesthesia team to evaluate on the day of surgery.   VS: BP (!) 143/99   Pulse (!) 51   Temp 36.6 C (Oral)   Resp 18   Ht 5\' 11"  (1.803 m)   Wt 79.1 kg   SpO2 97%   BMI 24.31 kg/m    PROVIDERS: [name], MD is PCP - Oswaldo Milian, MD is cardiologist. Also evaluated by Rodriguez-Guzman, Raquel, RPH-CPP,  in the [facility]N Clinic. - [name], MD is EP cardiologist. Evaluated on 11/08/19. At that visit, watchful waiting recommended for sinus bradycardia and atrial tachycardia. He was not symptomatic enough at that time to recommend catheter ablation. There was also concern that anti-arrhythmic therapy could make his sinus node worse.    LABS: Labs reviewed: Acceptable for surgery. (all labs ordered are listed, but only abnormal results are displayed)  Labs Reviewed  COMPREHENSIVE METABOLIC PANEL - Abnormal; Notable for the following components:      Result Value   Glucose, Bld 106 (*)    All other components within normal limits  URINALYSIS, ROUTINE W REFLEX MICROSCOPIC - Abnormal; Notable for the following components:   Leukocytes,Ua TRACE (*)    All other components within normal limits  CBC WITH DIFFERENTIAL/PLATELET  PROTIME-INR  PREPARE RBC (CROSSMATCH)  TYPE AND SCREEN     IMAGES: CT Abd/pelvis 12/05/19: IMPRESSION: 1. New 11.0 x 11.1 cm fat density mass containing calcification, stranding, and nodularity posterior to the right kidney with greater anterior displacement of the right kidney when compared to the prior study. 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 2. Unchanged size of the 9.8 x 8.8 cm retroperitoneal calcification superior to the right kidney, which is more densely calcified in the interim. 3. New small fat containing left inguinal hernia. 4. Aortic Atherosclerosis (ICD10-I70.0).  CXR 05/30/19: IMPRESSION: No edema or consolidation.  Areas of mild scarring. Heart size normal. Tortuous aorta raises question of chronic hypertensive change. Stable compression of the L1  vertebral body.   EKG: 01/09/20: Sinus bradycardia 53 bpm   CV: Nuclear stress test 11/16/19:  There was no ST segment deviation noted during stress. Nonsustained atrial tachycardia noted pre-infusion.  The left ventricular ejection fraction is normal (55-65%).  Nuclear  stress EF: 56%.  There is a small defect of mild severity present in the basal inferoseptal, mid inferoseptal and apex location. This defect is non-reversible and, in the setting of normal LVF, is consistent with attenuation artifact. No ischemia.  This is a low risk study.   MRI Cardiac 09/14/19: IMPRESSION: 1. Mild asymmetric hypertrophy measuring 30mm in basal septum (26mm in posterior wall), not meeting criteria for hypertrophic cardiomyopathy (<61mm) 2. No late gadolinium enhancement 3. Normal LV size and systolic function (EF 19%) 4. Normal RV size and systolic function (EF 50%) 5. Dilated ascending aorta, measures up to 74mm x 62mm   Echo 07/06/19: IMPRESSIONS  1. Left ventricular ejection fraction, by estimation, is 65 to 70%. The  left ventricle has normal function. The left ventricle has no regional  wall motion abnormalities. There is moderate left ventricular hypertrophy.  Left ventricular diastolic  parameters are consistent with Grade I diastolic dysfunction (impaired  relaxation).  2. Right ventricular systolic function is normal. The right ventricular  size is normal. There is normal pulmonary artery systolic pressure.  3. Left atrial size was moderately dilated.  4. The mitral valve is grossly normal. Trivial mitral valve  regurgitation.  5. The aortic valve is tricuspid. Aortic valve regurgitation is trivial.  Mild aortic valve sclerosis is present, with no evidence of aortic valve  stenosis.  6. Aortic dilatation noted. There is moderate dilatation of the ascending  aorta measuring 44 mm.  7. The inferior vena cava is normal in size with greater than 50%  respiratory variability, suggesting right atrial pressure of 3 mmHg.    Long term cardiac event monitor 06/08/19-06/21/29: Study Highlights:  485 episodes of SVT, longest lasting 4 minutes 43 seconds,  One episode of NSVT, lasting 8 beats. May represent SVT with aberrancy  Occasional PACs (2% of  beats) 14days of data recorded on Zio monitor. Patient had a min HR of 34 bpm, max HR of 203 bpm, and avg HR of 53 bpm. Predominant underlying rhythm was Sinus Rhythm. 485 episodes of SVT, longest lasting 4 minutes 43 seconds.  One episode of NSVT, lasting 8 beats.  No atrial fibrillation, high degree block, or pauses noted. Isolated ventricular ectopy was rare (<1%).  Isolated PACs were occasional (2% of beats).   There were 0 triggered events.    Past Medical History:  Diagnosis Date  . Acute appendicitis 01/21/2014  . Arthritis   . Dysrhythmia   . History of hiatal hernia   . Hypertension     Past Surgical History:  Procedure Laterality Date  . BACK SURGERY     and neck and trigger finger surgery  . CERVICAL SPINE SURGERY    . ESOPHAGOGASTRODUODENOSCOPY (EGD) WITH PROPOFOL Left 12/24/2015   Procedure: ESOPHAGOGASTRODUODENOSCOPY (EGD) WITH PROPOFOL;  Surgeon: Otis Brace, MD;  Location: [place];  Service: Gastroenterology;  Laterality: Left;  . HIATAL HERNIA REPAIR N/A 12/26/2015   Procedure: LAPAROSCOPIC REPAIR OF HIATAL HERNIA WITH MESH;  Surgeon: Michael Boston, MD;  Location: [facility];  Service: General;  Laterality: N/A;  . LAPAROSCOPIC APPENDECTOMY N/A 01/21/2014   Procedure: APPENDECTOMY LAPAROSCOPIC;  Surgeon: Autumn Messing III, MD;  Location: [facility];  Service: General;  Laterality: N/A;  . TRIGGER FINGER RELEASE  MEDICATIONS: . acetaminophen (TYLENOL) 500 MG tablet  . hydroxypropyl methylcellulose / hypromellose (ISOPTO TEARS / GONIOVISC) 2.5 % ophthalmic solution  . lisinopril (ZESTRIL) 10 MG tablet  . Multiple Vitamins-Minerals (MULTIVITAMIN WITH MINERALS) tablet   No current facility-administered medications for this encounter.    Myra Gianotti, PA-C Surgical Short Stay/Anesthesiology [facility] Phone [phone] [facility] Phone [phone] 01/17/2020 1:32 PM

## 2020-01-18 ENCOUNTER — Encounter (HOSPITAL_COMMUNITY): Payer: Self-pay | Admitting: General Surgery

## 2020-01-19 ENCOUNTER — Inpatient Hospital Stay (HOSPITAL_COMMUNITY): Payer: Medicare HMO | Admitting: Anesthesiology

## 2020-01-19 ENCOUNTER — Inpatient Hospital Stay (HOSPITAL_COMMUNITY)
Admission: RE | Admit: 2020-01-19 | Discharge: 2020-01-24 | DRG: 516 | Disposition: A | Discharge status: Home / Self Care | Payer: Medicare HMO | Attending: General Surgery | Admitting: General Surgery

## 2020-01-19 ENCOUNTER — Encounter (HOSPITAL_COMMUNITY): Payer: Self-pay | Admitting: General Surgery

## 2020-01-19 ENCOUNTER — Encounter (HOSPITAL_COMMUNITY)
Admission: RE | Disposition: A | Discharge status: Home / Self Care | Payer: Self-pay | Source: Home / Self Care | Attending: General Surgery

## 2020-01-19 ENCOUNTER — Other Ambulatory Visit: Payer: Self-pay

## 2020-01-19 DIAGNOSIS — K449 Diaphragmatic hernia without obstruction or gangrene: Secondary | ICD-10-CM | POA: Diagnosis not present

## 2020-01-19 DIAGNOSIS — R1900 Intra-abdominal and pelvic swelling, mass and lump, unspecified site: Secondary | ICD-10-CM | POA: Diagnosis present

## 2020-01-19 DIAGNOSIS — N179 Acute kidney failure, unspecified: Secondary | ICD-10-CM | POA: Diagnosis present

## 2020-01-19 DIAGNOSIS — I10 Essential (primary) hypertension: Secondary | ICD-10-CM | POA: Diagnosis present

## 2020-01-19 DIAGNOSIS — R7401 Elevation of levels of liver transaminase levels: Secondary | ICD-10-CM | POA: Diagnosis not present

## 2020-01-19 DIAGNOSIS — K4030 Unilateral inguinal hernia, with obstruction, without gangrene, not specified as recurrent: Secondary | ICD-10-CM | POA: Diagnosis not present

## 2020-01-19 DIAGNOSIS — I471 Supraventricular tachycardia: Secondary | ICD-10-CM | POA: Diagnosis not present

## 2020-01-19 DIAGNOSIS — R066 Hiccough: Secondary | ICD-10-CM | POA: Diagnosis not present

## 2020-01-19 DIAGNOSIS — M62838 Other muscle spasm: Secondary | ICD-10-CM | POA: Diagnosis present

## 2020-01-19 DIAGNOSIS — M1990 Unspecified osteoarthritis, unspecified site: Secondary | ICD-10-CM | POA: Diagnosis present

## 2020-01-19 DIAGNOSIS — D176 Benign lipomatous neoplasm of spermatic cord: Secondary | ICD-10-CM | POA: Diagnosis not present

## 2020-01-19 DIAGNOSIS — Z23 Encounter for immunization: Secondary | ICD-10-CM

## 2020-01-19 DIAGNOSIS — C480 Malignant neoplasm of retroperitoneum: Secondary | ICD-10-CM | POA: Diagnosis present

## 2020-01-19 DIAGNOSIS — Z981 Arthrodesis status: Secondary | ICD-10-CM | POA: Diagnosis not present

## 2020-01-19 DIAGNOSIS — C419 Malignant neoplasm of bone and articular cartilage, unspecified: Principal | ICD-10-CM | POA: Diagnosis present

## 2020-01-19 DIAGNOSIS — R1909 Other intra-abdominal and pelvic swelling, mass and lump: Secondary | ICD-10-CM | POA: Diagnosis not present

## 2020-01-19 DIAGNOSIS — K4090 Unilateral inguinal hernia, without obstruction or gangrene, not specified as recurrent: Secondary | ICD-10-CM | POA: Diagnosis not present

## 2020-01-19 DIAGNOSIS — Z20822 Contact with and (suspected) exposure to covid-19: Secondary | ICD-10-CM | POA: Diagnosis present

## 2020-01-19 DIAGNOSIS — R001 Bradycardia, unspecified: Secondary | ICD-10-CM | POA: Diagnosis present

## 2020-01-19 DIAGNOSIS — C488 Malignant neoplasm of overlapping sites of retroperitoneum and peritoneum: Secondary | ICD-10-CM | POA: Diagnosis not present

## 2020-01-19 HISTORY — PX: INSERTION OF MESH: SHX5868

## 2020-01-19 HISTORY — PX: INGUINAL HERNIA REPAIR: SHX194

## 2020-01-19 HISTORY — PX: RESECTION OF RETROPERITONEAL MASS: SHX6340

## 2020-01-19 LAB — CBC
HCT: 38.8 % — ABNORMAL LOW (ref 39.0–52.0)
Hemoglobin: 12.7 g/dL — ABNORMAL LOW (ref 13.0–17.0)
MCH: 31.9 pg (ref 26.0–34.0)
MCHC: 32.7 g/dL (ref 30.0–36.0)
MCV: 97.5 fL (ref 80.0–100.0)
Platelets: 217 10*3/uL (ref 150–400)
RBC: 3.98 MIL/uL — ABNORMAL LOW (ref 4.22–5.81)
RDW: 12.5 % (ref 11.5–15.5)
WBC: 19.7 10*3/uL — ABNORMAL HIGH (ref 4.0–10.5)
nRBC: 0 % (ref 0.0–0.2)

## 2020-01-19 LAB — CREATININE, SERUM
Creatinine, Ser: 1.48 mg/dL — ABNORMAL HIGH (ref 0.61–1.24)
GFR calc Af Amer: 53 mL/min — ABNORMAL LOW (ref >60)
GFR calc non Af Amer: 46 mL/min — ABNORMAL LOW (ref >60)

## 2020-01-19 LAB — GLUCOSE, CAPILLARY: Glucose-Capillary: 229 mg/dL — ABNORMAL HIGH (ref 70–99)

## 2020-01-19 SURGERY — EXCISION, MASS, RETROPERITONEUM
Anesthesia: General | Site: Inguinal

## 2020-01-19 MED ORDER — BUPIVACAINE 0.25 % ON-Q PUMP DUAL CATH 300 ML
300.0000 mL | INJECTION | Status: DC
  Filled 2020-01-19 (×2): qty 300

## 2020-01-19 MED ORDER — ROCURONIUM BROMIDE 10 MG/ML (PF) SYRINGE
PREFILLED_SYRINGE | INTRAVENOUS | Status: DC | PRN
  Administered 2020-01-19 (×2): 40 mg via INTRAVENOUS
  Administered 2020-01-19: 50 mg via INTRAVENOUS
  Administered 2020-01-19: 60 mg via INTRAVENOUS

## 2020-01-19 MED ORDER — CEFAZOLIN SODIUM-DEXTROSE 2-4 GM/100ML-% IV SOLN
2.0000 g | INTRAVENOUS | Status: AC
  Administered 2020-01-19 (×2): 2 g via INTRAVENOUS

## 2020-01-19 MED ORDER — MIDAZOLAM HCL 2 MG/2ML IJ SOLN
INTRAMUSCULAR | Status: DC | PRN
  Administered 2020-01-19: 2 mg via INTRAVENOUS

## 2020-01-19 MED ORDER — PROPOFOL 10 MG/ML IV BOLUS
INTRAVENOUS | Status: DC | PRN
  Administered 2020-01-19: 110 mg via INTRAVENOUS

## 2020-01-19 MED ORDER — PROCHLORPERAZINE EDISYLATE 10 MG/2ML IJ SOLN
10.0000 mg | Freq: Four times a day (QID) | INTRAMUSCULAR | Status: DC | PRN
  Administered 2020-01-19 – 2020-01-20 (×2): 10 mg via INTRAVENOUS
  Filled 2020-01-19 (×2): qty 2

## 2020-01-19 MED ORDER — ONDANSETRON HCL 4 MG/2ML IJ SOLN
INTRAMUSCULAR | Status: AC
  Filled 2020-01-19: qty 2

## 2020-01-19 MED ORDER — CEFAZOLIN SODIUM 1 G IJ SOLR
INTRAMUSCULAR | Status: AC
  Filled 2020-01-19: qty 20

## 2020-01-19 MED ORDER — LACTATED RINGERS IV SOLN: INTRAVENOUS | Status: DC | PRN

## 2020-01-19 MED ORDER — ACETAMINOPHEN 325 MG PO TABS: 325.0000 mg | ORAL_TABLET | Freq: Four times a day (QID) | ORAL | Status: DC | PRN

## 2020-01-19 MED ORDER — PHENYLEPHRINE HCL-NACL 10-0.9 MG/250ML-% IV SOLN
INTRAVENOUS | Status: DC | PRN
  Administered 2020-01-19: 25 ug/min via INTRAVENOUS

## 2020-01-19 MED ORDER — CHLORHEXIDINE GLUCONATE 0.12 % MT SOLN
OROMUCOSAL | Status: AC
  Administered 2020-01-19: 15 mL via OROMUCOSAL
  Filled 2020-01-19: qty 15

## 2020-01-19 MED ORDER — DEXAMETHASONE SODIUM PHOSPHATE 10 MG/ML IJ SOLN
INTRAMUSCULAR | Status: DC | PRN
  Administered 2020-01-19: 5 mg via INTRAVENOUS

## 2020-01-19 MED ORDER — KETAMINE HCL 50 MG/5ML IJ SOSY
PREFILLED_SYRINGE | INTRAMUSCULAR | Status: AC
  Filled 2020-01-19: qty 5

## 2020-01-19 MED ORDER — 0.9 % SODIUM CHLORIDE (POUR BTL) OPTIME
TOPICAL | Status: DC | PRN
  Administered 2020-01-19 (×3): 1000 mL

## 2020-01-19 MED ORDER — KCL IN DEXTROSE-NACL 20-5-0.45 MEQ/L-%-% IV SOLN
INTRAVENOUS | Status: DC
  Filled 2020-01-19 (×6): qty 1000

## 2020-01-19 MED ORDER — HYDROMORPHONE HCL 1 MG/ML IJ SOLN
INTRAMUSCULAR | Status: AC
  Filled 2020-01-19: qty 1

## 2020-01-19 MED ORDER — LIDOCAINE 2% (20 MG/ML) 5 ML SYRINGE
INTRAMUSCULAR | Status: AC
  Filled 2020-01-19: qty 5

## 2020-01-19 MED ORDER — DIAZEPAM 2 MG PO TABS
2.0000 mg | ORAL_TABLET | Freq: Three times a day (TID) | ORAL | Status: DC | PRN
  Administered 2020-01-24: 2 mg via ORAL
  Filled 2020-01-19: qty 1

## 2020-01-19 MED ORDER — MORPHINE SULFATE (PF) 4 MG/ML IV SOLN
4.0000 mg | Freq: Once | INTRAVENOUS | Status: AC
  Administered 2020-01-19: 4 mg via INTRAVENOUS
  Filled 2020-01-19: qty 1

## 2020-01-19 MED ORDER — CHLORHEXIDINE GLUCONATE CLOTH 2 % EX PADS: 6.0000 | MEDICATED_PAD | Freq: Once | CUTANEOUS | Status: DC

## 2020-01-19 MED ORDER — BUPIVACAINE 0.25 % ON-Q PUMP DUAL CATH 300 ML
INJECTION | Status: AC | PRN
  Administered 2020-01-19: 300 mL

## 2020-01-19 MED ORDER — ACETAMINOPHEN 500 MG PO TABS: 1000.0000 mg | ORAL_TABLET | ORAL | Status: AC

## 2020-01-19 MED ORDER — SODIUM CHLORIDE 0.9% FLUSH: 9.0000 mL | INTRAVENOUS | Status: DC | PRN

## 2020-01-19 MED ORDER — BUPIVACAINE LIPOSOME 1.3 % IJ SUSP
20.0000 mL | Freq: Once | INTRAMUSCULAR | Status: DC
  Filled 2020-01-19 (×2): qty 20

## 2020-01-19 MED ORDER — LIDOCAINE-EPINEPHRINE 1 %-1:100000 IJ SOLN
INTRAMUSCULAR | Status: AC
  Filled 2020-01-19: qty 1

## 2020-01-19 MED ORDER — MORPHINE SULFATE 2 MG/ML IV SOLN
INTRAVENOUS | Status: DC
  Administered 2020-01-19: 1 mg via INTRAVENOUS
  Administered 2020-01-19 – 2020-01-20 (×2): 3 mg via INTRAVENOUS
  Administered 2020-01-20: 1 mg via INTRAVENOUS
  Administered 2020-01-21 (×2): 2 mg via INTRAVENOUS
  Administered 2020-01-22: 3 mg via INTRAVENOUS
  Administered 2020-01-22: 1 mg via INTRAVENOUS
  Filled 2020-01-19: qty 50

## 2020-01-19 MED ORDER — LACTATED RINGERS IV SOLN: INTRAVENOUS | Status: DC

## 2020-01-19 MED ORDER — DEXMEDETOMIDINE (PRECEDEX) IN NS 20 MCG/5ML (4 MCG/ML) IV SYRINGE
PREFILLED_SYRINGE | INTRAVENOUS | Status: DC | PRN
  Administered 2020-01-19: 4 ug via INTRAVENOUS
  Administered 2020-01-19: 8 ug via INTRAVENOUS

## 2020-01-19 MED ORDER — FENTANYL CITRATE (PF) 250 MCG/5ML IJ SOLN
INTRAMUSCULAR | Status: AC
  Filled 2020-01-19: qty 5

## 2020-01-19 MED ORDER — ORAL CARE MOUTH RINSE: 15.0000 mL | Freq: Once | OROMUCOSAL | Status: AC

## 2020-01-19 MED ORDER — SUGAMMADEX SODIUM 200 MG/2ML IV SOLN
INTRAVENOUS | Status: DC | PRN
  Administered 2020-01-19 (×2): 100 mg via INTRAVENOUS

## 2020-01-19 MED ORDER — GLYCOPYRROLATE PF 0.2 MG/ML IJ SOSY
PREFILLED_SYRINGE | INTRAMUSCULAR | Status: DC | PRN
  Administered 2020-01-19: .1 mg via INTRAVENOUS

## 2020-01-19 MED ORDER — DEXAMETHASONE SODIUM PHOSPHATE 10 MG/ML IJ SOLN
INTRAMUSCULAR | Status: AC
  Filled 2020-01-19: qty 1

## 2020-01-19 MED ORDER — ROCURONIUM BROMIDE 10 MG/ML (PF) SYRINGE
PREFILLED_SYRINGE | INTRAVENOUS | Status: AC
  Filled 2020-01-19: qty 10

## 2020-01-19 MED ORDER — BUPIVACAINE LIPOSOME 1.3 % IJ SUSP
INTRAMUSCULAR | Status: DC | PRN
  Administered 2020-01-19: 20 mL

## 2020-01-19 MED ORDER — DIPHENHYDRAMINE HCL 12.5 MG/5ML PO ELIX: 12.5000 mg | ORAL_SOLUTION | Freq: Four times a day (QID) | ORAL | Status: DC | PRN

## 2020-01-19 MED ORDER — BUPIVACAINE HCL (PF) 0.25 % IJ SOLN
INTRAMUSCULAR | Status: AC
  Filled 2020-01-19: qty 30

## 2020-01-19 MED ORDER — KETAMINE HCL 10 MG/ML IJ SOLN
INTRAMUSCULAR | Status: DC | PRN
  Administered 2020-01-19: 40 mg via INTRAVENOUS
  Administered 2020-01-19 (×4): 10 mg via INTRAVENOUS

## 2020-01-19 MED ORDER — GABAPENTIN 100 MG PO CAPS: 100.0000 mg | ORAL_CAPSULE | ORAL | Status: AC

## 2020-01-19 MED ORDER — LIDOCAINE 2% (20 MG/ML) 5 ML SYRINGE
INTRAMUSCULAR | Status: DC | PRN
  Administered 2020-01-19: 80 mg via INTRAVENOUS

## 2020-01-19 MED ORDER — ENSURE PRE-SURGERY PO LIQD
296.0000 mL | Freq: Once | ORAL | Status: AC
  Administered 2020-01-19: 296 mL via ORAL

## 2020-01-19 MED ORDER — ACETAMINOPHEN 500 MG PO TABS
ORAL_TABLET | ORAL | Status: AC
  Administered 2020-01-19: 1000 mg via ORAL
  Filled 2020-01-19: qty 2

## 2020-01-19 MED ORDER — ENSURE PRE-SURGERY PO LIQD
592.0000 mL | Freq: Once | ORAL | Status: AC
  Administered 2020-01-19: 592 mL via ORAL

## 2020-01-19 MED ORDER — GABAPENTIN 100 MG PO CAPS
ORAL_CAPSULE | ORAL | Status: AC
  Administered 2020-01-19: 100 mg via ORAL
  Filled 2020-01-19: qty 1

## 2020-01-19 MED ORDER — ONDANSETRON HCL 4 MG/2ML IJ SOLN
4.0000 mg | Freq: Four times a day (QID) | INTRAMUSCULAR | Status: DC | PRN
  Administered 2020-01-19: 4 mg via INTRAVENOUS
  Filled 2020-01-19: qty 2

## 2020-01-19 MED ORDER — HEPARIN SODIUM (PORCINE) 5000 UNIT/ML IJ SOLN
5000.0000 [IU] | Freq: Three times a day (TID) | INTRAMUSCULAR | Status: DC
  Administered 2020-01-20 – 2020-01-24 (×14): 5000 [IU] via SUBCUTANEOUS
  Filled 2020-01-19 (×14): qty 1

## 2020-01-19 MED ORDER — FENTANYL CITRATE (PF) 250 MCG/5ML IJ SOLN
INTRAMUSCULAR | Status: DC | PRN
Start: 2020-01-19 — End: 2020-01-19
  Administered 2020-01-19 (×2): 50 ug via INTRAVENOUS
  Administered 2020-01-19 (×2): 100 ug via INTRAVENOUS

## 2020-01-19 MED ORDER — MIDAZOLAM HCL 2 MG/2ML IJ SOLN
INTRAMUSCULAR | Status: AC
  Filled 2020-01-19: qty 2

## 2020-01-19 MED ORDER — STERILE WATER FOR IRRIGATION IR SOLN
Status: DC | PRN
  Administered 2020-01-19: 1000 mL

## 2020-01-19 MED ORDER — HYDROMORPHONE HCL 1 MG/ML IJ SOLN
0.2500 mg | INTRAMUSCULAR | Status: DC | PRN
  Administered 2020-01-19 (×4): 0.5 mg via INTRAVENOUS

## 2020-01-19 MED ORDER — PROPOFOL 10 MG/ML IV BOLUS
INTRAVENOUS | Status: AC
  Filled 2020-01-19: qty 40

## 2020-01-19 MED ORDER — LIDOCAINE-EPINEPHRINE 1 %-1:100000 IJ SOLN
INTRAMUSCULAR | Status: DC | PRN
  Administered 2020-01-19: 20 mL

## 2020-01-19 MED ORDER — DIPHENHYDRAMINE HCL 50 MG/ML IJ SOLN: 12.5000 mg | Freq: Four times a day (QID) | INTRAMUSCULAR | Status: DC | PRN

## 2020-01-19 MED ORDER — DEXMEDETOMIDINE (PRECEDEX) IN NS 20 MCG/5ML (4 MCG/ML) IV SYRINGE
PREFILLED_SYRINGE | INTRAVENOUS | Status: AC
  Filled 2020-01-19: qty 5

## 2020-01-19 MED ORDER — ONDANSETRON HCL 4 MG/2ML IJ SOLN
INTRAMUSCULAR | Status: DC | PRN
  Administered 2020-01-19: 4 mg via INTRAVENOUS

## 2020-01-19 MED ORDER — EPHEDRINE 5 MG/ML INJ
INTRAVENOUS | Status: AC
  Filled 2020-01-19: qty 10

## 2020-01-19 MED ORDER — CEFAZOLIN SODIUM-DEXTROSE 2-4 GM/100ML-% IV SOLN
INTRAVENOUS | Status: AC
  Filled 2020-01-19: qty 100

## 2020-01-19 MED ORDER — ALBUMIN HUMAN 5 % IV SOLN: INTRAVENOUS | Status: DC | PRN

## 2020-01-19 MED ORDER — NALOXONE HCL 0.4 MG/ML IJ SOLN: 0.4000 mg | INTRAMUSCULAR | Status: DC | PRN

## 2020-01-19 MED ORDER — BUPIVACAINE 0.25 % ON-Q PUMP DUAL CATH 300 ML: 300.0000 mL | INJECTION | Status: DC

## 2020-01-19 MED ORDER — ONDANSETRON HCL 4 MG/2ML IJ SOLN
4.0000 mg | Freq: Once | INTRAMUSCULAR | Status: AC | PRN
  Administered 2020-01-19: 4 mg via INTRAVENOUS

## 2020-01-19 MED ORDER — HEMOSTATIC AGENTS (NO CHARGE) OPTIME
TOPICAL | Status: DC | PRN
  Administered 2020-01-19: 1 via TOPICAL

## 2020-01-19 MED ORDER — EPHEDRINE SULFATE 50 MG/ML IJ SOLN
INTRAMUSCULAR | Status: DC | PRN
  Administered 2020-01-19 (×2): 5 mg via INTRAVENOUS

## 2020-01-19 MED ORDER — CHLORHEXIDINE GLUCONATE 0.12 % MT SOLN: 15.0000 mL | Freq: Once | OROMUCOSAL | Status: AC

## 2020-01-19 MED ORDER — GABAPENTIN 100 MG PO CAPS
100.0000 mg | ORAL_CAPSULE | Freq: Two times a day (BID) | ORAL | Status: DC
  Administered 2020-01-19 – 2020-01-24 (×10): 100 mg via ORAL
  Filled 2020-01-19 (×10): qty 1

## 2020-01-19 MED ORDER — BUPIVACAINE HCL (PF) 0.25 % IJ SOLN
INTRAMUSCULAR | Status: DC | PRN
  Administered 2020-01-19: 20 mL

## 2020-01-19 SURGICAL SUPPLY — 110 items
ADH SKN CLS APL DERMABOND .7 (GAUZE/BANDAGES/DRESSINGS) ×3
AGENT HMST 10 BLLW SHRT CANN (HEMOSTASIS) ×3
APL PRP STRL LF DISP 70% ISPRP (MISCELLANEOUS) ×3
BAG BILE T-TUBES STRL (MISCELLANEOUS) ×2 IMPLANT
BAG DRN 9.5 2 ADJ BELT ADPR (MISCELLANEOUS)
BLADE CLIPPER SURG (BLADE) ×2 IMPLANT
BLADE SURG 11 STRL SS (BLADE) ×2 IMPLANT
BOOT SUTURE AID YELLOW STND (SUTURE) IMPLANT
CANISTER SUCT 3000ML PPV (MISCELLANEOUS) ×4 IMPLANT
CATH KIT ON-Q SILVERSOAK 7.5 (CATHETERS) IMPLANT
CATH KIT ON-Q SILVERSOAK 7.5IN (CATHETERS) ×8 IMPLANT
CHLORAPREP W/TINT 26 (MISCELLANEOUS) ×4 IMPLANT
CLIP VESOCCLUDE LG 6/CT (CLIP) ×10 IMPLANT
CLIP VESOCCLUDE MED 24/CT (CLIP) ×4 IMPLANT
CLIP VESOCCLUDE MED 6/CT (CLIP) ×4 IMPLANT
CLIP VESOLOCK LG 6/CT PURPLE (CLIP) ×4 IMPLANT
CLIP VESOLOCK MED 6/CT (CLIP) ×4 IMPLANT
CLIP VESOLOCK MED LG 6/CT (CLIP) ×4 IMPLANT
COUNTER NEEDLE 20 DBL MAG RED (NEEDLE) ×2 IMPLANT
COVER MAYO STAND STRL (DRAPES) ×2 IMPLANT
COVER SURGICAL LIGHT HANDLE (MISCELLANEOUS) ×4 IMPLANT
DECANTER SPIKE VIAL GLASS SM (MISCELLANEOUS) ×8 IMPLANT
DERMABOND ADVANCED (GAUZE/BANDAGES/DRESSINGS) ×1
DERMABOND ADVANCED .7 DNX12 (GAUZE/BANDAGES/DRESSINGS) ×1 IMPLANT
DRAIN CHANNEL 19F RND (DRAIN) ×2 IMPLANT
DRAIN PENROSE 1/2X12 LTX STRL (WOUND CARE) ×2 IMPLANT
DRAPE LAPAROSCOPIC ABDOMINAL (DRAPES) ×2 IMPLANT
DRAPE WARM FLUID 44X44 (DRAPES) ×6 IMPLANT
DRSG COVADERM 4X14 (GAUZE/BANDAGES/DRESSINGS) ×2 IMPLANT
DRSG TEGADERM 4X4.75 (GAUZE/BANDAGES/DRESSINGS) ×4 IMPLANT
ELECT BLADE 6.5 EXT (BLADE) ×4 IMPLANT
ELECT REM PT RETURN 9FT ADLT (ELECTROSURGICAL) ×4
ELECTRODE REM PT RTRN 9FT ADLT (ELECTROSURGICAL) ×3 IMPLANT
EVACUATOR SILICONE 100CC (DRAIN) ×2 IMPLANT
GEL ULTRASOUND 20GR AQUASONIC (MISCELLANEOUS) IMPLANT
GLOVE BIO SURGEON STRL SZ 6 (GLOVE) ×8 IMPLANT
GLOVE BIO SURGEON STRL SZ 6.5 (GLOVE) ×4 IMPLANT
GLOVE BIOGEL PI IND STRL 6.5 (GLOVE) ×2 IMPLANT
GLOVE BIOGEL PI IND STRL 7.0 (GLOVE) ×3 IMPLANT
GLOVE BIOGEL PI IND STRL 7.5 (GLOVE) ×2 IMPLANT
GLOVE BIOGEL PI INDICATOR 6.5 (GLOVE) ×2
GLOVE BIOGEL PI INDICATOR 7.0 (GLOVE) ×3
GLOVE BIOGEL PI INDICATOR 7.5 (GLOVE) ×2
GLOVE ECLIPSE 7.5 STRL STRAW (GLOVE) ×4 IMPLANT
GLOVE INDICATOR 6.5 STRL GRN (GLOVE) ×4 IMPLANT
GLOVE SS BIOGEL STRL SZ 7 (GLOVE) ×2 IMPLANT
GLOVE SUPERSENSE BIOGEL SZ 7 (GLOVE) ×2
GOWN STRL REUS W/ TWL LRG LVL3 (GOWN DISPOSABLE) ×10 IMPLANT
GOWN STRL REUS W/TWL 2XL LVL3 (GOWN DISPOSABLE) ×4 IMPLANT
GOWN STRL REUS W/TWL LRG LVL3 (GOWN DISPOSABLE) ×24
HANDLE SUCTION POOLE (INSTRUMENTS) ×3 IMPLANT
HEMOSTAT HEMOBLAST BELLOWS (HEMOSTASIS) ×2 IMPLANT
KIT BASIN OR (CUSTOM PROCEDURE TRAY) ×4 IMPLANT
KIT TURNOVER KIT B (KITS) ×4 IMPLANT
LOOP VESSEL MAXI BLUE (MISCELLANEOUS) IMPLANT
LOOP VESSEL MINI RED (MISCELLANEOUS) IMPLANT
MARKER SKIN DUAL TIP RULER LAB (MISCELLANEOUS) ×2 IMPLANT
MESH PARIETEX PROGRIP LEFT (Mesh General) ×2 IMPLANT
NDL HYPO 25GX1X1/2 BEV (NEEDLE) ×2 IMPLANT
NEEDLE HYPO 25GX1X1/2 BEV (NEEDLE) ×4 IMPLANT
NS IRRIG 1000ML POUR BTL (IV SOLUTION) ×8 IMPLANT
PACK GENERAL/GYN (CUSTOM PROCEDURE TRAY) ×4 IMPLANT
PACK UNIVERSAL I (CUSTOM PROCEDURE TRAY) ×2 IMPLANT
PAD ARMBOARD 7.5X6 YLW CONV (MISCELLANEOUS) ×6 IMPLANT
PENCIL SMOKE EVACUATOR (MISCELLANEOUS) ×4 IMPLANT
RELOAD STAPLE 60 2.6 WHT THN (STAPLE) IMPLANT
RELOAD STAPLER WHITE 60MM (STAPLE) IMPLANT
SHEARS FOC LG CVD HARMONIC 17C (MISCELLANEOUS) ×2 IMPLANT
SLEEVE SUCTION CATH 165 (SLEEVE) ×2 IMPLANT
SPONGE INTESTINAL PEANUT (DISPOSABLE) ×4 IMPLANT
SPONGE LAP 18X18 RF (DISPOSABLE) ×18 IMPLANT
STAPLER ECHELON POWERED (MISCELLANEOUS) ×2 IMPLANT
STAPLER RELOAD WHITE 60MM (STAPLE)
STAPLER VISISTAT 35W (STAPLE) ×4 IMPLANT
STRIP CLOSURE SKIN 1/2X4 (GAUZE/BANDAGES/DRESSINGS) ×2 IMPLANT
SUCTION POOLE HANDLE (INSTRUMENTS) ×4
SUT ETHILON 1 TP 1 60 (SUTURE) ×2 IMPLANT
SUT ETHILON 2 0 FS 18 (SUTURE) ×2 IMPLANT
SUT MNCRL AB 4-0 PS2 18 (SUTURE) ×4 IMPLANT
SUT PDS AB 1 TP1 96 (SUTURE) ×10 IMPLANT
SUT PROLENE 2 0 SH 30 (SUTURE) ×6 IMPLANT
SUT PROLENE 3 0 RB 1 (SUTURE) ×6 IMPLANT
SUT PROLENE 3 0 SH 48 (SUTURE) ×6 IMPLANT
SUT PROLENE 3 0 SH DA (SUTURE) ×2 IMPLANT
SUT PROLENE 4 0 RB 1 (SUTURE) ×8
SUT PROLENE 4 0 SH DA (SUTURE) ×2 IMPLANT
SUT PROLENE 4-0 RB1 .5 CRCL 36 (SUTURE) ×6 IMPLANT
SUT SILK 2 0 SH (SUTURE) ×2 IMPLANT
SUT SILK 2 0 SH CR/8 (SUTURE) ×2 IMPLANT
SUT SILK 2 0 TIES 10X30 (SUTURE) ×2 IMPLANT
SUT SILK 3 0 SH CR/8 (SUTURE) ×2 IMPLANT
SUT SILK 3 0 TIES 10X30 (SUTURE) ×2 IMPLANT
SUT VIC AB 2-0 CT1 36 (SUTURE) ×2 IMPLANT
SUT VIC AB 2-0 CTX 27 (SUTURE) ×6 IMPLANT
SUT VIC AB 2-0 SH 18 (SUTURE) ×2 IMPLANT
SUT VIC AB 2-0 SH 27 (SUTURE) ×4
SUT VIC AB 2-0 SH 27X BRD (SUTURE) ×3 IMPLANT
SUT VIC AB 3-0 SH 18 (SUTURE) ×2 IMPLANT
SUT VIC AB 3-0 SH 27 (SUTURE) ×4
SUT VIC AB 3-0 SH 27X BRD (SUTURE) ×2 IMPLANT
SUT VIC AB 3-0 SH 27XBRD (SUTURE) ×3 IMPLANT
SUT VIC AB 3-0 SH 8-18 (SUTURE) ×2 IMPLANT
SUT VICRYL AB 2 0 TIES (SUTURE) ×2 IMPLANT
SUT VICRYL AB 3 0 TIES (SUTURE) ×2 IMPLANT
SYR BULB IRRIG 60ML STRL (SYRINGE) ×2 IMPLANT
SYR CONTROL 10ML LL (SYRINGE) ×4 IMPLANT
TOWEL GREEN STERILE (TOWEL DISPOSABLE) ×4 IMPLANT
TOWEL GREEN STERILE FF (TOWEL DISPOSABLE) ×4 IMPLANT
TRAY FOLEY MTR SLVR 14FR STAT (SET/KITS/TRAYS/PACK) ×4 IMPLANT
TUNNELER SHEATH ON-Q 16GX12 DP (PAIN MANAGEMENT) ×4 IMPLANT

## 2020-01-19 NOTE — Anesthesia Procedure Notes (Signed)
Procedure Name: Intubation Date/Time: 01/19/2020 7:58 AM Performed by: Rande Brunt, CRNA Pre-anesthesia Checklist: Patient identified, Emergency Drugs available, Suction available and Patient being monitored Patient Re-evaluated:Patient Re-evaluated prior to induction Oxygen Delivery Method: Circle system utilized Preoxygenation: Pre-oxygenation with 100% oxygen Induction Type: IV induction Ventilation: Mask ventilation without difficulty Laryngoscope Size: Miller and 3 Grade View: Grade I Tube type: Oral Tube size: 7.5 mm Number of attempts: 1 Airway Equipment and Method: Stylet Placement Confirmation: ETT inserted through vocal cords under direct vision,  positive ETCO2 and breath sounds checked- equal and bilateral Secured at: 22 cm Tube secured with: Tape Dental Injury: Teeth and Oropharynx as per pre-operative assessment

## 2020-01-19 NOTE — Interval H&P Note (Signed)
History and Physical Interval Note:  01/19/2020 7:41 AM  William Avila  has presented today for surgery, with the diagnosis of retroperitneal mass left inguinal hernia.  The various methods of treatment have been discussed with the patient and family. 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  The patient's history has been reviewed, patient examined, no change in status, stable for surgery.  I have reviewed the patient's chart and labs.  Questions were answered to the patient's satisfaction.     [place] Klein

## 2020-01-19 NOTE — H&P (Signed)
William Avila Location: [facility] Surgery Patient #: [id number] DOB: 1945/06/27 Married / Language: English / Race: White Male   History of Present Illness The patient is a 74 year old male who presents with a complaint of Mass. Pt is a very nice 74 yo M referred by Dr. Laurann Montana for right retroperitoneal masses. The patient has a calcified area that has been present for multiple years, but the patient had a new scan for left lower quadrant pain and this area was more calcified. He also had a new lobulated density and abnormal surrounding fatty tissue that was 11 cm in total and displacing the kidney anteriorly. He reports no right flank pain and no bloody urine. His main complaint is left groin pain and swelling. He has to lie down and push the hernia back in.   The patient has had a lumbar spine fusion with a lower midline incision (Brooks/Brabham) as well as a lap hiatal hernia repair (Dr. Johney Maine) and a lap appy (Dr. Marlou Starks).  CT abd/pelvis 12/05/2019 1. new 11 cm fat density mass containing calcification stranding and nodularity posterior to the right kidney with greater anterior displacement of the right kidney compared to the prior study. 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 2.unchanged 9.8 cm retroperitoneal calcification 3. new small fat containing LIH 4. aortic atherosclerosis.      Allergies Codeine/Codeine Derivatives  Ibuprin *ANALGESICS - ANTI-INFLAMMATORY*  Allergies Reconciled   Medication History Lisinopril (10MG  Tablet, Oral) Active. Omeprazole (40MG  Capsule DR, 1 (one) Capsule Oral daily, Taken starting 12/31/2015) Active. (Pt will need to open capsule to sprinkle over applesauce in order to take the medication after surgery.) HydrALAZINE HCl (25MG  Tablet, Oral) Active. Medications Reconciled    Review of Systems  All other systems negative  Vitals Weight: 172 lb Height: 72in Body Surface Area: 2 m Body Mass Index: 23.33 kg/m   Temp.: 91F (Tympanic)  Pulse: 61 (Regular)  BP: 136/82(Sitting, Left Arm, Standard)       Physical Exam  The physical exam findings are as follows: Note: LIH reducible.  General Mental Status-Alert. General Appearance-Consistent with stated age. Hydration-Well hydrated. Voice-Normal.  Head and Neck Head-normocephalic, atraumatic with no lesions or palpable masses. Trachea-midline. Thyroid Gland Characteristics - normal size and consistency.  Eye Eyeball - Bilateral-Extraocular movements intact. Sclera/Conjunctiva - Bilateral-No scleral icterus.  Chest and Lung Exam Chest and lung exam reveals -quiet, even and easy respiratory effort with no use of accessory muscles and on auscultation, normal breath sounds, no adventitious sounds and normal vocal resonance. Inspection Chest Wall - Normal. Back - normal.  Cardiovascular Cardiovascular examination reveals -normal heart sounds, regular rate and rhythm with no murmurs and normal pedal pulses bilaterally.  Abdomen Inspection Inspection of the abdomen reveals - No Hernias. Palpation/Percussion Palpation and Percussion of the abdomen reveal - Soft, Non Tender, No Rebound tenderness, No Rigidity (guarding) and No hepatosplenomegaly. Auscultation Auscultation of the abdomen reveals - Bowel sounds normal.  Neurologic Neurologic evaluation reveals -alert and oriented x 3 with no impairment of recent or remote memory. Mental Status-Normal.  Musculoskeletal Global Assessment -Note: no gross deformities.  Normal Exam - Left-Upper Extremity Strength Normal and Lower Extremity Strength Normal. Normal Exam - Right-Upper Extremity Strength Normal and Lower Extremity Strength Normal.  Lymphatic Head & Neck  General Head & Neck Lymphatics: Bilateral - Description - Normal. Axillary  General Axillary Region: Bilateral - Description - Normal. Tenderness - Non Tender. Femoral &  Inguinal  Generalized Femoral & Inguinal Lymphatics: Bilateral - Description - No Generalized  lymphadenopathy.    Assessment & Plan  RETROPERITONEAL MASS (R19.00) Impression: Pt has a right retroperitoneal mass with several components. These will all come out en bloc. I am concerned that the new lobulated region is likely a sarcoma. The more calcified area is likely benign. The kidney appears separate on the scan, but for margins, this may need to come out. I would use a right subcostal incision with an OnQ pain pump.  I discussed risks with the patient and the wife (on the phone). I reviewed risks of bleeding, infection, damage to adjacent structures, pain, recurrent tumor, possible need for additional treatment, heart or lung complications, death, COVID infection, blood clot and more.  They wish to proceed. this is a priority one case because of new mass that is very concerning for sarcoma.   The use of home mechanical compression devices is a proven alternative which safely decreases the rate of post-surgical DVT (Bartlett MA, Mauck Salvadore Dom PR. Prevention of venous thromboembolism in patients undergoing bariatric surgery. Delleker. 2015; 30:076-226. Published 2015 Aug 17. doi:10.2147/VHRM.S73799) and should be approved without delay for this patient.  Order: Location manager Device for home use. Current Plans You are being scheduled for surgery- Our schedulers will call you.  You should hear from our office's scheduling department within 5 working days about the location, date, and time of surgery. We try to make accommodations for patient's preferences in scheduling surgery, but sometimes the OR schedule or the surgeon's schedule prevents [place] from making those accommodations.  If you have not heard from our office [phone]) in 5 working days, call the office and ask for your surgeon's nurse.  If you have other questions about your diagnosis, plan, or surgery,  call the office and ask for your surgeon's nurse.  Pt Education - [facility] Open Abdominal Surgery HCI LEFT INGUINAL HERNIA (K40.90) ATRIAL TACHYCARDIA (I47.1) Impression: His multifactorial arrhythmia history will necessitate telemetry. He has had a stress test which is in our system. SINUS BRADYCARDIA (R00.1)

## 2020-01-19 NOTE — Transfer of Care (Signed)
Immediate Anesthesia Transfer of Care Note  Patient: William Avila  Procedure(s) Performed: 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 (N/A Abdomen) LEFT INGUINAL HERNIA REPAIR WITH MESH (Left Inguinal) INSERTION OF MESH (Left Inguinal)  Patient Location: PACU  Anesthesia Type:General  Level of Consciousness: awake, alert  and drowsy  Airway & Oxygen Therapy: Patient Spontanous Breathing and Patient connected to face mask oxygen  Post-op Assessment: Report given to RN, Post -op Vital signs reviewed and stable and Patient moving all extremities  Post vital signs: Reviewed and stable  Last Vitals:  Vitals Value Taken Time  BP 126/84 01/19/20 1222  Temp    Pulse 58 01/19/20 1227  Resp 14 01/19/20 1227  SpO2 100 % 01/19/20 1227  Vitals shown include unvalidated device data.  Last Pain:  Vitals:   01/19/20 0642  PainSc: 0-No pain      Patients Stated Pain Goal: 3 (94/85/46 2703)  Complications: No complications documented.

## 2020-01-19 NOTE — Progress Notes (Signed)
Pt sts he had an episode lasting about 5 minutes of sweating, left-sided chest pain, and left leg cramping waking him from his sleep at 0200 today. Pt sts his heart felt as if it was beating out of his chest. Pt sts this was a new event for him. Dr. Royce Macadamia from anesthesia made aware and new orders received.

## 2020-01-19 NOTE — Op Note (Signed)
PRE-OPERATIVE DIAGNOSIS: right retroperitoneal mass, left inguinal hernia  POST-OPERATIVE DIAGNOSIS:  Same  PROCEDURE:  Procedure(s): radical resection of right retroperitoneal mass (20 cm) en bloc with right adrenal, repair of chronically incarcerated left inguinal hernia with mesh  SURGEON:  Surgeon(s): Stark Klein, MD  ASSISTANT: Daiva Huge, MD, PGY7  ANESTHESIA:   general  DRAINS: OnQ pain pump   LOCAL MEDICATIONS USED:  BUPIVICAINE  and LIDOCAINE   SPECIMEN:  Source of Specimen:  right retroperitoneal mass  DISPOSITION OF SPECIMEN:  PATHOLOGY  COUNTS:  YES  DICTATION: .Dragon Dictation  PLAN OF CARE: Admit to inpatient   PATIENT DISPOSITION:  PACU - hemodynamically stable.  FINDINGS:  Large retroperitoneal mass with dominant calcified region around 10 cm but with surrounding firm masses scattered in retroperitoneal fatty tissue.  Mass adherent to inferior liver capsule as well as posterior renal capsule.  Superomedially, mass involved adrenal and went posteriorly behind the IVC.  Left inguinal hernia was direct with incarcerated omentum  EBL: 400 mL  PROCEDURE:  Patient was identified in the holding area and taken to the operating room where he was placed supine on the operating room table.  General anesthesia was induced.  A Foley catheter was placed and his right arm was tucked.  His abdomen was then prepped and draped in sterile fashion.  A timeout was performed according to the surgical safety checklist.  When all was correct, we continued.  The inguinal hernia was addressed first.  Local was infiltrated into the skin of the incision.  An ilioinguinal block was also performed.  An obliquely oriented transverse incision was made. Dissection was carried through the soft tissue to expose the inguinal canal and inguinal ligament along its lower edge. The external oblique fascia was split along the course of its fibers, exposing the inguinal canal.   The ilioinguinal  nerve was located underneath the external oblique, and it was sharply dissected away from the adjacent structures.  This was tucked behind the inferior external oblique so that the mesh would not incorporate the nerve.    The cord and nerve were looped using a Penrose drain and reflected out of the field. No direct hernia sac was seen immediately.  There was a moderate cord lipoma that was resected.  A direct sac was identified.   There was incarcerated omentum in the sac.  This was reduced.  The sac was closed with a 2-0 silk pursestring suture and reduced into the abdomen.  The left sided Progrip mesh was selected.   This was secured to the pubic tubercle with a 2-0 Prolene suture.  The mesh was passed around the cord, and laid down flat underneath the external oblique.  The ilioinguinal nerve was laid on top of the mesh.   The entire area was anesthetized with Exparel.  The external oblique fashion was then closed in a continuous fashion using 2-0 Vicryl suture taking care not to cause nerve entrapment.  The Scarpa's fascia was closed with a running 3-0 vicryl.  The skin was then closed with interrupted 3-0 vicryl deep dermal sutures and a running 4-0 Monocryl suture.  The skin was cleaned, dried, and dressed with Dermabond.    A midline incision was then made going from the xiphoid to just below the umbilicus.  The subcutaneous tissues were divided with cautery.  The fascia was entered in the midline and this was elevated.  The peritoneum was entered care was taken to protect the underlying organs well the fascial incision was  made the length of the skin incision.  The Bookwalter retractor was set up to assist with retraction.  The mass was immediately palpable and the growth had done a fair amount of dissection of pushing the right colon anterior medially.  This did have to be taken down a bit more with cautery.  The colon, small bowel, duodenum, and stomach were retracted out of the way.  The  retroperitoneum was opened with the cautery.  The mass was reflected medially.  It was seen to be adherent to the liver capsule of the inferior right lobe.  A segment of the liver capsule was taken en bloc with the mass.  The retroperitoneal fatty tissue around the kidney had other firm nodules in this.  This was dissected inferiorly behind the kidney.  The capsule of the kidney was slightly fibrotic adjacent to the mass, but the mass did not appear to invade the capsule of the kidney.  The kidney was able to be dissected away from the fatty tissue involving the multiple masslike areas.  Once the mass was freed up inferiorly, it was reflected superiorly and more of the retroperitoneal attachments posteriorly were able to be taken.  This was mostly taken off of the psoas muscle.  Since the kidney was reflected medially, the ureter was protected.  The dissection continued superiorly along the lateral border of the vena cava.  The harmonic scalpel was used to perform the bulk of this dissection, however large clips were used in places where feeding vessels were seen.  The tissue adjacent to the vena cava was quite soft.    At this point the mass was only adherent superior medially.  The adrenal was involved and the retroperitoneal mass, so it was taken en bloc.  The right adrenal vein was clipped and divided with the harmonic.  The mass did appear to continue to superior medially behind the vena cava.  This was dissected bluntly and with the harmonic.  Once the tissue was very soft, clips were placed and the harmonic used to completely divide the mass off the retroperitoneum.  The mass was then marked medially and laterally at the superior and inferior aspect.  The portion of the mass that had been adherent to the kidney capsule was also marked.  The inferior aspect of the right liver had some bleeding.  Several Prolene sutures as well as a #1 Vicryl were used to control the bleeding.  The remainder of the  retroperitoneum appeared hemostatic.  Hemoblast was administered to the inferior aspect of the liver and a moist laparotomy sponge was placed over this with 3 minutes.  Pressure was held.  This point, the abdomen was hemostatic.  The kidney was quite mobile considering that the fatty tissue behind the kidney had to be completely removed.  This was pexed back into place to the psoas with 2-0 Vicryls.  The Bookwalter was taken down and the lap count was performed.  When that was correct, the abdomen was irrigated again.  The midline fascia was infiltrated with a mixture of 20 cc of Exparel and 20 cc of Marcaine bupivacaine mix.  The 11 blade was used to create small skin incisions for the On-Q catheters.  The On-Q tunnelers were placed on either side of the fascial incision.   The fascia was closed using running #1 looped PDS suture x2.  The skin was irrigated and then closed with staples.  The On-Q catheters were advanced through the tunnelers and the sheaths were removed.  Additional local was infiltrated into the On-Q catheters.  The wounds were then cleaned, dried, and dressed with a Covaderm.  The patient was then allowed to emerge from anesthesia and taken to the PACU in stable condition.  Needle, sponge, and instrument counts were correct x2.

## 2020-01-19 NOTE — Anesthesia Postprocedure Evaluation (Signed)
Anesthesia Post Note  Patient: William Avila  Procedure(s) Performed: 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 (N/A Abdomen) LEFT INGUINAL HERNIA REPAIR WITH MESH (Left Inguinal) INSERTION OF MESH (Left Inguinal)     Patient location during evaluation: PACU Anesthesia Type: General Level of consciousness: awake and alert and oriented Pain management: pain level controlled Vital Signs Assessment: post-procedure vital signs reviewed and stable Respiratory status: spontaneous breathing, nonlabored ventilation, respiratory function stable and patient connected to nasal cannula oxygen Cardiovascular status: blood pressure returned to baseline and stable Postop Assessment: no apparent nausea or vomiting Anesthetic complications: no   No complications documented.  Last Vitals:  Vitals:   01/19/20 1222 01/19/20 1237  BP:  119/84  Pulse:  62  Resp:  15  Temp: (!) 36.3 C   SpO2:  100%    Last Pain:  Vitals:   01/19/20 0642  PainSc: 0-No pain                 Katurah Karapetian A.

## 2020-01-20 ENCOUNTER — Encounter (HOSPITAL_COMMUNITY): Payer: Self-pay | Admitting: General Surgery

## 2020-01-20 LAB — CBC
HCT: 35.1 % — ABNORMAL LOW (ref 39.0–52.0)
Hemoglobin: 12.1 g/dL — ABNORMAL LOW (ref 13.0–17.0)
MCH: 33.2 pg (ref 26.0–34.0)
MCHC: 34.5 g/dL (ref 30.0–36.0)
MCV: 96.4 fL (ref 80.0–100.0)
Platelets: 237 10*3/uL (ref 150–400)
RBC: 3.64 MIL/uL — ABNORMAL LOW (ref 4.22–5.81)
RDW: 12.7 % (ref 11.5–15.5)
WBC: 14.7 10*3/uL — ABNORMAL HIGH (ref 4.0–10.5)
nRBC: 0 % (ref 0.0–0.2)

## 2020-01-20 LAB — COMPREHENSIVE METABOLIC PANEL
ALT: 133 U/L — ABNORMAL HIGH (ref 0–44)
AST: 128 U/L — ABNORMAL HIGH (ref 15–41)
Albumin: 3.1 g/dL — ABNORMAL LOW (ref 3.5–5.0)
Alkaline Phosphatase: 73 U/L (ref 38–126)
Anion gap: 9 (ref 5–15)
BUN: 13 mg/dL (ref 8–23)
CO2: 23 mmol/L (ref 22–32)
Calcium: 8.7 mg/dL — ABNORMAL LOW (ref 8.9–10.3)
Chloride: 102 mmol/L (ref 98–111)
Creatinine, Ser: 1.59 mg/dL — ABNORMAL HIGH (ref 0.61–1.24)
GFR calc Af Amer: 49 mL/min — ABNORMAL LOW (ref >60)
GFR calc non Af Amer: 42 mL/min — ABNORMAL LOW (ref >60)
Glucose, Bld: 180 mg/dL — ABNORMAL HIGH (ref 70–99)
Potassium: 4.8 mmol/L (ref 3.5–5.1)
Sodium: 134 mmol/L — ABNORMAL LOW (ref 135–145)
Total Bilirubin: 0.8 mg/dL (ref 0.3–1.2)
Total Protein: 5.2 g/dL — ABNORMAL LOW (ref 6.5–8.1)

## 2020-01-20 LAB — GLUCOSE, CAPILLARY
Glucose-Capillary: 150 mg/dL — ABNORMAL HIGH (ref 70–99)
Glucose-Capillary: 116 mg/dL — ABNORMAL HIGH (ref 70–99)
Glucose-Capillary: 133 mg/dL — ABNORMAL HIGH (ref 70–99)

## 2020-01-20 LAB — PROTIME-INR
INR: 1.2 (ref 0.8–1.2)
Prothrombin Time: 14.4 seconds (ref 11.4–15.2)

## 2020-01-20 LAB — HEMOGLOBIN A1C
Hgb A1c MFr Bld: 5.6 % (ref 4.8–5.6)
Mean Plasma Glucose: 114.02 mg/dL

## 2020-01-20 LAB — PHOSPHORUS: Phosphorus: 3.4 mg/dL (ref 2.5–4.6)

## 2020-01-20 LAB — MAGNESIUM: Magnesium: 1.7 mg/dL (ref 1.7–2.4)

## 2020-01-20 MED ORDER — INSULIN ASPART 100 UNIT/ML ~~LOC~~ SOLN
0.0000 [IU] | Freq: Three times a day (TID) | SUBCUTANEOUS | Status: DC
  Administered 2020-01-20 – 2020-01-24 (×4): 2 [IU] via SUBCUTANEOUS

## 2020-01-20 MED ORDER — METOPROLOL TARTRATE 5 MG/5ML IV SOLN
2.5000 mg | INTRAVENOUS | Status: AC
  Administered 2020-01-20 – 2020-01-22 (×9): 2.5 mg via INTRAVENOUS
  Filled 2020-01-20 (×11): qty 5

## 2020-01-20 MED ORDER — DIAZEPAM 2 MG PO TABS: 2.0000 mg | ORAL_TABLET | Freq: Three times a day (TID) | ORAL | 0 refills | Status: DC | PRN

## 2020-01-20 MED ORDER — SODIUM CHLORIDE 0.9 % IV BOLUS
1000.0000 mL | Freq: Once | INTRAVENOUS | Status: AC
  Administered 2020-01-20: 1000 mL via INTRAVENOUS

## 2020-01-20 MED ORDER — OXYCODONE HCL 5 MG PO TABS: 5.0000 mg | ORAL_TABLET | Freq: Four times a day (QID) | ORAL | 0 refills | Status: DC | PRN

## 2020-01-20 NOTE — Progress Notes (Signed)
Pt's heart rate irregular, ranging from 70's to having sporadic SVTs up to 160's. Pt says this has been his baseline and he has been seeing a cardiologist. He says he takes Lisinopril 10 mg in the am and 5 mg at night. He specifies no other medications. Dr. Barry Dienes was contacted to make sure she knew about pt's heart rate. See new orders.   Justice Rocher, RN

## 2020-01-20 NOTE — Evaluation (Signed)
Physical Therapy Evaluation Patient Details Name: William Avila MRN: [id number] DOB: 24-Aug-1945 Today's Date: 01/20/2020   History of Present Illness  74 yo male s/p radical resection of retroperitoneal mass concerning for liposarcoma, L inguinal hernia repair on 9/30. PMH includes lumbar and cervical fusion, lap hiatial hernia, lap appy, HTN.  Clinical Impression   Pt presents with severe abdominal pain, difficulty performing bed mobility secondary to abdominal pain, impaired balance vs baseline, and decreased activity tolerance. Pt to benefit from acute PT to address deficits. Pt required min-mod assist for bed mobility and transfer OOB this day, mostly limited by abdominal pain post-operatively. Pt with tachycardia to 160s bpm during transfer, gait training deferred secondary to this and pt pain presentation. PT anticipates pt will progress well, do not expect pt to need PT follow up. PT to progress mobility as tolerated, and will continue to follow acutely.      Follow Up Recommendations Supervision for mobility/OOB    Equipment Recommendations  None recommended by PT    Recommendations for Other Services OT consult     Precautions / Restrictions Precautions Precautions: Fall Precaution Comments: abdominal midline incision, PCA, pain control bulb to inguinal incision Restrictions Weight Bearing Restrictions: No      Mobility  Bed Mobility Overal bed mobility: Needs Assistance Bed Mobility: Rolling;Sidelying to Sit Rolling: Min assist Sidelying to sit: Mod assist       General bed mobility comments: min assist for roll to R for trunk translation, mod assist for sidelying to sit for trunk elevation and scooting to EOB. Pt limited by severe abdominal pain.  Transfers Overall transfer level: Needs assistance Equipment used: Rolling walker (2 wheeled) Transfers: Sit to/from [facility] Sit to Stand: Min assist;From elevated surface Stand pivot  transfers: Min assist;From elevated surface       General transfer comment: min assist for power up, steadying, and transfer to recliner at bedside. HR max 162 bpm with extertion.  Ambulation/Gait             General Gait Details: unable this day, extreme tachycardia and pain limiting.  Stairs            Wheelchair Mobility    Modified Rankin (Stroke Patients Only)       Balance Overall balance assessment: Needs assistance Sitting-balance support: No upper extremity supported;Feet supported Sitting balance-Leahy Scale: Fair     Standing balance support: Bilateral upper extremity supported;During functional activity Standing balance-Leahy Scale: Poor Standing balance comment: reliant on RW                             Pertinent Vitals/Pain Pain Assessment: 0-10 Pain Score: 8  Pain Location: abdomen Pain Descriptors / Indicators: Sore;Discomfort;Grimacing Pain Intervention(s): Limited activity within patient's tolerance;Monitored during session;Premedicated before session;Repositioned;PCA encouraged    Home Living Family/patient expects to be discharged to:: Private residence Living Arrangements: Spouse/significant other Available Help at Discharge: Family Type of Home: House Home Access: Stairs to enter   Technical brewer of Steps: 4 (4-inch steps) Home Layout: Laundry or work area in [place]: Environmental consultant - 2 wheels;Bedside commode      Prior Function Level of Independence: Independent         Comments: enjoys being outside     Hand Dominance   Dominant Hand: Right    Extremity/Trunk Assessment   Upper Extremity Assessment Upper Extremity Assessment: Defer to OT evaluation    Lower Extremity Assessment Lower Extremity  Assessment: Overall WFL for tasks assessed    Cervical / Trunk Assessment Cervical / Trunk Assessment: Normal  Communication   Communication: No difficulties  Cognition Arousal/Alertness:  Awake/alert Behavior During Therapy: WFL for tasks assessed/performed Overall Cognitive Status: Within Functional Limits for tasks assessed                                        General Comments General comments (skin integrity, edema, etc.): Hrmax 162 bpm during transfer, SpO2 98% on RA post-transfer    Exercises     Assessment/Plan    PT Assessment Patient needs continued PT services  PT Problem List Decreased strength;Decreased mobility;Decreased balance;Decreased knowledge of use of DME;Pain;Decreased activity tolerance;Cardiopulmonary status limiting activity;Decreased safety awareness;Decreased skin integrity       PT Treatment Interventions DME instruction;Therapeutic activities;Gait training;Therapeutic exercise;Patient/family education;Balance training;Stair training;Functional mobility training;Neuromuscular re-education    PT Goals (Current goals can be found in the Care Plan section)  Acute Rehab PT Goals Patient Stated Goal: go home PT Goal Formulation: With patient/family Time For Goal Achievement: 02/03/20 Potential to Achieve Goals: Good    Frequency Min 3X/week   Barriers to discharge        Co-evaluation               AM-PAC PT "6 Clicks" Mobility  Outcome Measure Help needed turning from your back to your side while in a flat bed without using bedrails?: A Little Help needed moving from lying on your back to sitting on the side of a flat bed without using bedrails?: A Lot Help needed moving to and from a bed to a chair (including a wheelchair)?: A Little Help needed standing up from a chair using your arms (e.g., wheelchair or bedside chair)?: A Little Help needed to walk in hospital room?: A Little Help needed climbing 3-5 steps with a railing? : A Lot 6 Click Score: 16    End of Session   Activity Tolerance: Patient tolerated treatment well;Patient limited by pain Patient left: in chair;with call bell/phone within reach;with  family/visitor present Nurse Communication: Mobility status;Other (comment) (abdominal dressing with serosanguinous fluid) PT Visit Diagnosis: Other abnormalities of gait and mobility (R26.89);Pain Pain - Right/Left:  (mid) Pain - part of body:  (abdomen)    Time: 7544-9201 PT Time Calculation (min) (ACUTE ONLY): 28 min   Charges:   PT Evaluation $PT Eval Low Complexity: 1 Low PT Treatments $Therapeutic Activity: 8-22 mins        Aroush Chasse E, PT Acute Rehabilitation Services Pager [phone]  Office [phone]   [place] D Ojas [place] 01/20/2020, 1:06 PM

## 2020-01-20 NOTE — Progress Notes (Signed)
1 Day Post-Op   Subjective/Chief Complaint: Sore this morning.  Some nausea.    Objective: Vital signs in last 24 hours: Temp:  [97 F (36.1 C)-98.2 F (36.8 C)] 98 F (36.7 C) (10/01 0730) Pulse Rate:  [39-108] 69 (10/01 0730) Resp:  [8-22] 19 (10/01 0800) BP: (99-140)/(73-108) 119/84 (10/01 0730) SpO2:  [93 %-100 %] 96 % (10/01 0800) Last BM Date:  (PTA )  Intake/Output from previous day: 09/30 0701 - 10/01 0700 In: 3678.9 [I.V.:3328.9; IV Piggyback:350] Out: 1350 [Urine:950; Blood:400] Intake/Output this shift: No intake/output data recorded.  General appearance: alert, cooperative and mild distress Resp: breathing comfortably Cardio: regular rate and rhythm GI: soft, approp tender, onQ in place.  non distended Extremities: extremities normal, atraumatic, no cyanosis or edema  Lab Results:  Recent Labs    01/19/20 1855 01/20/20 0530  WBC 19.7* 14.7*  HGB 12.7* 12.1*  HCT 38.8* 35.1*  PLT 217 237   BMET Recent Labs    01/19/20 1855 01/20/20 0530  NA  --  134*  K  --  4.8  CL  --  102  CO2  --  23  GLUCOSE  --  180*  BUN  --  13  CREATININE 1.48* 1.59*  CALCIUM  --  8.7*   PT/INR Recent Labs    01/20/20 0530  LABPROT 14.4  INR 1.2   ABG No results for input(s): PHART, HCO3 in the last 72 hours.  Invalid input(s): PCO2, PO2  Studies/Results: No results found.  Anti-infectives: Anti-infectives (From admission, onward)   Start     Dose/Rate Route Frequency Ordered Stop   01/19/20 0645  ceFAZolin (ANCEF) IVPB 2g/100 mL premix        2 g 200 mL/hr over 30 Minutes Intravenous On call to O.R. 01/19/20 0644 01/19/20 1145      Assessment/Plan: s/p Procedure(s): 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 (N/A) LEFT INGUINAL HERNIA REPAIR WITH MESH (Left) INSERTION OF MESH (Left)   AKI- NS bolus  Ok to d/c foley BID low dose gabapentin, OnQ, and PCA for pain control.   PRN valium for muscle spasms.   Add SSI. Nausea  anticipated due to manipulation of duodenum.   Mild elevation of transaminases also anticipated as a portion of liver capsule was removed.  Add metoprolol for atrial tachycardia.     LOS: 1 day    William Avila 01/20/2020

## 2020-01-20 NOTE — Progress Notes (Addendum)
Pt's foley catheter removed at 0835 this am. Pt has not urinated since, and says that he does not feel the urge or like he could if he tried. Pt bladder scanned multiple times and readings ranged from 133-190 mL. Dr. Chrisandra Carota office contacted, and receptionist agreed to text-page Dr. Donne Hazel.   Orders to I&O cath pt. 300 mL removed. Will continue to monitor.  Justice Rocher, RN

## 2020-01-21 ENCOUNTER — Other Ambulatory Visit: Payer: Self-pay | Admitting: Cardiology

## 2020-01-21 LAB — COMPREHENSIVE METABOLIC PANEL
ALT: 109 U/L — ABNORMAL HIGH (ref 0–44)
AST: 99 U/L — ABNORMAL HIGH (ref 15–41)
Albumin: 3 g/dL — ABNORMAL LOW (ref 3.5–5.0)
Alkaline Phosphatase: 70 U/L (ref 38–126)
Anion gap: 10 (ref 5–15)
BUN: 17 mg/dL (ref 8–23)
CO2: 21 mmol/L — ABNORMAL LOW (ref 22–32)
Calcium: 8.5 mg/dL — ABNORMAL LOW (ref 8.9–10.3)
Chloride: 102 mmol/L (ref 98–111)
Creatinine, Ser: 1.64 mg/dL — ABNORMAL HIGH (ref 0.61–1.24)
GFR calc Af Amer: 47 mL/min — ABNORMAL LOW (ref >60)
GFR calc non Af Amer: 41 mL/min — ABNORMAL LOW (ref >60)
Glucose, Bld: 123 mg/dL — ABNORMAL HIGH (ref 70–99)
Potassium: 4.6 mmol/L (ref 3.5–5.1)
Sodium: 133 mmol/L — ABNORMAL LOW (ref 135–145)
Total Bilirubin: 0.9 mg/dL (ref 0.3–1.2)
Total Protein: 5.1 g/dL — ABNORMAL LOW (ref 6.5–8.1)

## 2020-01-21 LAB — CBC
HCT: 31 % — ABNORMAL LOW (ref 39.0–52.0)
Hemoglobin: 10.4 g/dL — ABNORMAL LOW (ref 13.0–17.0)
MCH: 32.3 pg (ref 26.0–34.0)
MCHC: 33.5 g/dL (ref 30.0–36.0)
MCV: 96.3 fL (ref 80.0–100.0)
Platelets: 192 10*3/uL (ref 150–400)
RBC: 3.22 MIL/uL — ABNORMAL LOW (ref 4.22–5.81)
RDW: 12.8 % (ref 11.5–15.5)
WBC: 15.4 10*3/uL — ABNORMAL HIGH (ref 4.0–10.5)
nRBC: 0 % (ref 0.0–0.2)

## 2020-01-21 LAB — GLUCOSE, CAPILLARY
Glucose-Capillary: 124 mg/dL — ABNORMAL HIGH (ref 70–99)
Glucose-Capillary: 124 mg/dL — ABNORMAL HIGH (ref 70–99)
Glucose-Capillary: 106 mg/dL — ABNORMAL HIGH (ref 70–99)
Glucose-Capillary: 109 mg/dL — ABNORMAL HIGH (ref 70–99)

## 2020-01-21 LAB — MAGNESIUM: Magnesium: 1.7 mg/dL (ref 1.7–2.4)

## 2020-01-21 LAB — PHOSPHORUS: Phosphorus: 2.6 mg/dL (ref 2.5–4.6)

## 2020-01-21 MED ORDER — SODIUM CHLORIDE 0.9 % IV BOLUS
500.0000 mL | Freq: Once | INTRAVENOUS | Status: AC
  Administered 2020-01-21: 500 mL via INTRAVENOUS

## 2020-01-21 NOTE — Progress Notes (Signed)
    2 Days Post-Op  Subjective: Creatinine 1.6 today (from 1.5 yesterday). Marginal UOP but voiding spontaneously. Reports frequent hiccups and occasional nausea, taking sips of clear liquids. No vomiting. Transaminases downtrending.   Objective: Vital signs in last 24 hours: Temp:  [98.5 F (36.9 C)-100.4 F (38 C)] 98.7 F (37.1 C) (10/02 0821) Pulse Rate:  [76-95] 76 (10/02 0821) Resp:  [15-22] 20 (10/02 0821) BP: (120-181)/(84-113) 171/113 (10/02 0821) SpO2:  [94 %-98 %] 98 % (10/02 0821) Last BM Date:  (pta)  Intake/Output from previous day: 10/01 0701 - 10/02 0700 In: 1898.5 [P.O.:300; I.V.:1598.5] Out: 800 [Urine:800] Intake/Output this shift: No intake/output data recorded.  PE: General: resting comfortably, NAD Neuro: alert and oriented CV: RRR Resp: normal work of breathing, lungs CTAB Abdomen: midline incision with dressing in place, shadowing at inferior portion. L inguinal incision clean and dry with no erythema, drainage or induration.  Lab Results:  Recent Labs    01/20/20 0530 01/21/20 0344  WBC 14.7* 15.4*  HGB 12.1* 10.4*  HCT 35.1* 31.0*  PLT 237 192   BMET Recent Labs    01/20/20 0530 01/21/20 0344  NA 134* 133*  K 4.8 4.6  CL 102 102  CO2 23 21*  GLUCOSE 180* 123*  BUN 13 17  CREATININE 1.59* 1.64*  CALCIUM 8.7* 8.5*   PT/INR Recent Labs    01/20/20 0530  LABPROT 14.4  INR 1.2   CMP     Component Value Date/Time   NA 133 (L) 01/21/2020 0344   NA 136 01/09/2020 1148   K 4.6 01/21/2020 0344   CL 102 01/21/2020 0344   CO2 21 (L) 01/21/2020 0344   GLUCOSE 123 (H) 01/21/2020 0344   BUN 17 01/21/2020 0344   BUN 9 01/09/2020 1148   CREATININE 1.64 (H) 01/21/2020 0344   CALCIUM 8.5 (L) 01/21/2020 0344   PROT 5.1 (L) 01/21/2020 0344   ALBUMIN 3.0 (L) 01/21/2020 0344   AST 99 (H) 01/21/2020 0344   ALT 109 (H) 01/21/2020 0344   ALKPHOS 70 01/21/2020 0344   BILITOT 0.9 01/21/2020 0344   GFRNONAA 41 (L) 01/21/2020 0344    GFRAA 47 (L) 01/21/2020 0344   Lipase     Component Value Date/Time   LIPASE 55 (H) 12/23/2015 0851       Studies/Results: No results found.  Anti-infectives: Anti-infectives (From admission, onward)   Start     Dose/Rate Route Frequency Ordered Stop   01/19/20 0645  ceFAZolin (ANCEF) IVPB 2g/100 mL premix        2 g 200 mL/hr over 30 Minutes Intravenous On call to O.R. 01/19/20 1540 01/19/20 1145       Assessment/Plan 74 yo male POD2 s/p resection of right retroperitoneal mass and left inguinal hernia repair. - 500cc bolus for ongoing AKI, continue maintenance IV fluids - Keep on clear liquids given hiccups and nausea. Awaiting bowel function. - Continue PCA and OnQ pump for pain control - Ambulate, pulmonary toilet  VTE - SQH, SCDs Dispo - inpatient, progressive care   LOS: 2 days   Michaelle Birks, MD [facility] Surgery General, Hepatobiliary and Pancreatic Surgery 01/21/20 11:06 AM

## 2020-01-21 NOTE — Progress Notes (Signed)
Central monitor reported that patient went into atrial fibrillation for about 1 minute and then went back to sinus rhythm.

## 2020-01-22 LAB — COMPREHENSIVE METABOLIC PANEL
ALT: 78 U/L — ABNORMAL HIGH (ref 0–44)
AST: 60 U/L — ABNORMAL HIGH (ref 15–41)
Albumin: 2.6 g/dL — ABNORMAL LOW (ref 3.5–5.0)
Alkaline Phosphatase: 82 U/L (ref 38–126)
Anion gap: 7 (ref 5–15)
BUN: 13 mg/dL (ref 8–23)
CO2: 23 mmol/L (ref 22–32)
Calcium: 8.4 mg/dL — ABNORMAL LOW (ref 8.9–10.3)
Chloride: 102 mmol/L (ref 98–111)
Creatinine, Ser: 1.33 mg/dL — ABNORMAL HIGH (ref 0.61–1.24)
GFR calc Af Amer: >60 mL/min (ref >60)
GFR calc non Af Amer: 52 mL/min — ABNORMAL LOW (ref >60)
Glucose, Bld: 111 mg/dL — ABNORMAL HIGH (ref 70–99)
Potassium: 4.2 mmol/L (ref 3.5–5.1)
Sodium: 132 mmol/L — ABNORMAL LOW (ref 135–145)
Total Bilirubin: 1.1 mg/dL (ref 0.3–1.2)
Total Protein: 5 g/dL — ABNORMAL LOW (ref 6.5–8.1)

## 2020-01-22 LAB — MAGNESIUM: Magnesium: 1.8 mg/dL (ref 1.7–2.4)

## 2020-01-22 LAB — PHOSPHORUS: Phosphorus: 2.5 mg/dL (ref 2.5–4.6)

## 2020-01-22 LAB — CBC
HCT: 26.8 % — ABNORMAL LOW (ref 39.0–52.0)
Hemoglobin: 9.5 g/dL — ABNORMAL LOW (ref 13.0–17.0)
MCH: 33.8 pg (ref 26.0–34.0)
MCHC: 35.4 g/dL (ref 30.0–36.0)
MCV: 95.4 fL (ref 80.0–100.0)
Platelets: 180 10*3/uL (ref 150–400)
RBC: 2.81 MIL/uL — ABNORMAL LOW (ref 4.22–5.81)
RDW: 12.4 % (ref 11.5–15.5)
WBC: 13.6 10*3/uL — ABNORMAL HIGH (ref 4.0–10.5)
nRBC: 0 % (ref 0.0–0.2)

## 2020-01-22 LAB — GLUCOSE, CAPILLARY
Glucose-Capillary: 123 mg/dL — ABNORMAL HIGH (ref 70–99)
Glucose-Capillary: 120 mg/dL — ABNORMAL HIGH (ref 70–99)
Glucose-Capillary: 121 mg/dL — ABNORMAL HIGH (ref 70–99)
Glucose-Capillary: 112 mg/dL — ABNORMAL HIGH (ref 70–99)

## 2020-01-22 MED ORDER — HYDRALAZINE HCL 10 MG PO TABS
30.0000 mg | ORAL_TABLET | Freq: Once | ORAL | Status: AC
  Administered 2020-01-22: 30 mg via ORAL
  Filled 2020-01-22: qty 3

## 2020-01-22 MED ORDER — HYDRALAZINE HCL 25 MG PO TABS
25.0000 mg | ORAL_TABLET | Freq: Four times a day (QID) | ORAL | Status: DC | PRN
  Administered 2020-01-22: 25 mg via ORAL
  Filled 2020-01-22: qty 1

## 2020-01-22 MED ORDER — METOPROLOL TARTRATE 5 MG/5ML IV SOLN
2.5000 mg | INTRAVENOUS | Status: DC
  Administered 2020-01-22 – 2020-01-23 (×5): 2.5 mg via INTRAVENOUS
  Filled 2020-01-22 (×5): qty 5

## 2020-01-22 NOTE — Progress Notes (Signed)
PT Cancellation Note  Patient Details Name: AVIN GIBBONS MRN: [id number] DOB: [birth year]   Cancelled Treatment:    Reason Eval/Treat Not Completed: Medical issues which prohibited therapy. Pt is hypertensive today. New meds started. BP trending down, but still currently 181/106. PT to re-attempt tomorrow.   Lorriane Shire 01/22/2020, 2:01 PM   Lorrin Goodell, PT  Office # [phone] Pager [phone]

## 2020-01-22 NOTE — Progress Notes (Signed)
    3 Days Post-Op  Subjective: Creatinine down to 1.3, excellent UOP. Transaminases downtrending. Pain controlled. Tolerating clears, nausea and hiccups improving. Was out of bed in chair for an hour yesterday. No flatus yet.   Objective: Vital signs in last 24 hours: Temp:  [98 F (36.7 C)-99 F (37.2 C)] 98.7 F (37.1 C) (10/03 0759) Pulse Rate:  [71-98] 98 (10/02 1625) Resp:  [10-26] 19 (10/03 0759) BP: (157-181)/(102-112) 175/102 (10/03 0759) SpO2:  [92 %-98 %] 94 % (10/03 0759) Last BM Date: 01/25/20  Intake/Output from previous day: 10/02 0701 - 10/03 0700 In: 2396.7 [P.O.:820; I.V.:1576.7] Out: 1375 [Urine:1375] Intake/Output this shift: Total I/O In: -  Out: 250 [Urine:250]  PE: General: resting comfortably, NAD Neuro: alert and oriented CV: RRR Resp: normal work of breathing, lungs CTAB Abdomen: midline incision with clean dressing in place. L inguinal incision clean and dry with no erythema, drainage or induration.  Lab Results:  Recent Labs    01/21/20 0344 01/22/20 0250  WBC 15.4* 13.6*  HGB 10.4* 9.5*  HCT 31.0* 26.8*  PLT 192 180   BMET Recent Labs    01/21/20 0344 01/22/20 0250  NA 133* 132*  K 4.6 4.2  CL 102 102  CO2 21* 23  GLUCOSE 123* 111*  BUN 17 13  CREATININE 1.64* 1.33*  CALCIUM 8.5* 8.4*   PT/INR Recent Labs    01/20/20 0530  LABPROT 14.4  INR 1.2   CMP     Component Value Date/Time   NA 132 (L) 01/22/2020 0250   NA 136 01/09/2020 1148   K 4.2 01/22/2020 0250   CL 102 01/22/2020 0250   CO2 23 01/22/2020 0250   GLUCOSE 111 (H) 01/22/2020 0250   BUN 13 01/22/2020 0250   BUN 9 01/09/2020 1148   CREATININE 1.33 (H) 01/22/2020 0250   CALCIUM 8.4 (L) 01/22/2020 0250   PROT 5.0 (L) 01/22/2020 0250   ALBUMIN 2.6 (L) 01/22/2020 0250   AST 60 (H) 01/22/2020 0250   ALT 78 (H) 01/22/2020 0250   ALKPHOS 82 01/22/2020 0250   BILITOT 1.1 01/22/2020 0250   GFRNONAA 52 (L) 01/22/2020 0250   GFRAA >60 01/22/2020 0250    Lipase     Component Value Date/Time   LIPASE 55 (H) 12/23/2015 0851       Studies/Results: No results found.  Anti-infectives: Anti-infectives (From admission, onward)   Start     Dose/Rate Route Frequency Ordered Stop   01/19/20 0645  ceFAZolin (ANCEF) IVPB 2g/100 mL premix        2 g 200 mL/hr over 30 Minutes Intravenous On call to O.R. 01/19/20 9826 01/19/20 1145       Assessment/Plan 74 yo male POD3 s/p resection of right retroperitoneal mass and left inguinal hernia repair. - Advance to full liquid diet. Decrease IVF to 50 ml/hr. - Continue PCA and OnQ pump for pain control - Ambulate, pulmonary toilet, PT ordered - Hypertension: hold home ACE inhibitor due to AKI, on IV metop with HR in 60s so will not increase beta blocker today.  VTE - SQH, SCDs Dispo - inpatient, progressive care   LOS: 3 days   Michaelle Birks, MD [facility] Surgery General, Hepatobiliary and Pancreatic Surgery 01/22/20 8:50 AM

## 2020-01-22 NOTE — Progress Notes (Addendum)
Pt's blood pressures high this morning at 175/123 in the right arm and retaken at 178/110 in the left. On further assessment, pt had been having high blood pressure overnight and back to 01/20/20 at 1608. Pt does not have PRNs for high blood pressure. He does have scheduled Lopressor, but pt's HR running 57-62 this am. This RN attempted to call [facility] answering service. While explaining the pt situation, the receptionist told this RN to "stop talking" and said that the doctor will continue to watch the pressures. Will attempt alternate contact.   1015: Text-paged Kinsinger, MD, about pt's uncontrolled high bp, low HR, AKI, and bouts of A-fib. Call returned with oral orders for 30 mg PO Hydralazine and hold Lopressor. Will administer as a one-time dose as frequency was not specified before end of call.  Justice Rocher, RN

## 2020-01-23 LAB — GLUCOSE, CAPILLARY
Glucose-Capillary: 112 mg/dL — ABNORMAL HIGH (ref 70–99)
Glucose-Capillary: 115 mg/dL — ABNORMAL HIGH (ref 70–99)
Glucose-Capillary: 103 mg/dL — ABNORMAL HIGH (ref 70–99)
Glucose-Capillary: 132 mg/dL — ABNORMAL HIGH (ref 70–99)

## 2020-01-23 LAB — BASIC METABOLIC PANEL
Anion gap: 9 (ref 5–15)
BUN: 13 mg/dL (ref 8–23)
CO2: 23 mmol/L (ref 22–32)
Calcium: 8.3 mg/dL — ABNORMAL LOW (ref 8.9–10.3)
Chloride: 97 mmol/L — ABNORMAL LOW (ref 98–111)
Creatinine, Ser: 1.15 mg/dL (ref 0.61–1.24)
GFR calc Af Amer: >60 mL/min (ref >60)
GFR calc non Af Amer: >60 mL/min (ref >60)
Glucose, Bld: 114 mg/dL — ABNORMAL HIGH (ref 70–99)
Potassium: 3.8 mmol/L (ref 3.5–5.1)
Sodium: 129 mmol/L — ABNORMAL LOW (ref 135–145)

## 2020-01-23 LAB — CBC
HCT: 27.2 % — ABNORMAL LOW (ref 39.0–52.0)
Hemoglobin: 9.3 g/dL — ABNORMAL LOW (ref 13.0–17.0)
MCH: 32 pg (ref 26.0–34.0)
MCHC: 34.2 g/dL (ref 30.0–36.0)
MCV: 93.5 fL (ref 80.0–100.0)
Platelets: 203 10*3/uL (ref 150–400)
RBC: 2.91 MIL/uL — ABNORMAL LOW (ref 4.22–5.81)
RDW: 11.9 % (ref 11.5–15.5)
WBC: 8.3 10*3/uL (ref 4.0–10.5)
nRBC: 0 % (ref 0.0–0.2)

## 2020-01-23 MED ORDER — LISINOPRIL 10 MG PO TABS
10.0000 mg | ORAL_TABLET | Freq: Every day | ORAL | Status: DC
  Administered 2020-01-23: 10 mg via ORAL
  Filled 2020-01-23 (×2): qty 1

## 2020-01-23 MED ORDER — SODIUM CHLORIDE 0.9% FLUSH: 3.0000 mL | INTRAVENOUS | Status: DC | PRN

## 2020-01-23 MED ORDER — MORPHINE SULFATE (PF) 2 MG/ML IV SOLN: 1.0000 mg | INTRAVENOUS | Status: DC | PRN

## 2020-01-23 MED ORDER — SODIUM CHLORIDE 0.9% FLUSH
3.0000 mL | Freq: Two times a day (BID) | INTRAVENOUS | Status: DC
  Administered 2020-01-23 – 2020-01-24 (×3): 3 mL via INTRAVENOUS

## 2020-01-23 MED ORDER — METOPROLOL TARTRATE 12.5 MG HALF TABLET
12.5000 mg | ORAL_TABLET | Freq: Two times a day (BID) | ORAL | Status: DC
  Administered 2020-01-23 – 2020-01-24 (×3): 12.5 mg via ORAL
  Filled 2020-01-23 (×3): qty 1

## 2020-01-23 MED ORDER — OXYCODONE HCL 5 MG PO TABS
5.0000 mg | ORAL_TABLET | ORAL | Status: DC | PRN
  Administered 2020-01-23: 10 mg via ORAL
  Administered 2020-01-23: 5 mg via ORAL
  Administered 2020-01-24 (×3): 10 mg via ORAL
  Filled 2020-01-23 (×2): qty 2
  Filled 2020-01-23: qty 1
  Filled 2020-01-23 (×2): qty 2

## 2020-01-23 MED ORDER — SODIUM CHLORIDE 0.9 % IV SOLN: 250.0000 mL | INTRAVENOUS | Status: DC | PRN

## 2020-01-23 NOTE — Progress Notes (Signed)
4827 I was in pt room when pt notified me that he accidentally pulled out  his OnQ pump's tubing. MD notified waiting for orders.

## 2020-01-23 NOTE — Plan of Care (Signed)
  Problem: Clinical Measurements: Goal: Will remain free from infection Outcome: Progressing   Problem: Pain Managment: Goal: General experience of comfort will improve Outcome: Progressing   Problem: Elimination: Goal: Will not experience complications related to urinary retention Outcome: Progressing   Problem: Elimination: Goal: Will not experience complications related to bowel motility Outcome: Progressing   Problem: Safety: Goal: Ability to remain free from injury will improve Outcome: Progressing   Problem: Skin Integrity: Goal: Risk for impaired skin integrity will decrease Outcome: Progressing

## 2020-01-23 NOTE — Telephone Encounter (Signed)
This is Dr. Schumann's pt 

## 2020-01-23 NOTE — Progress Notes (Signed)
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 PMH includes lumbar and cervical fusion, lap hiatial hernia, lap appy, HTN.    PT Comments    Patient progressing to hallway ambulation this session with good tolerance and VSS.  Was up in the room earlier today to wash up moving with RW per NT/wife.  Ice placed at site of hernia repair as pt reported increased swelling.  Feel he should progress well for home without follow up PT.  Continue mobility with nursing assist and PT to follow up as well to ensure safety on stairs.    Follow Up Recommendations  Supervision for mobility/OOB;No PT follow up     Equipment Recommendations  None recommended by PT    Recommendations for Other Services       Precautions / Restrictions Precautions Precautions: Fall    Mobility  Bed Mobility Overal bed mobility: Needs Assistance Bed Mobility: Rolling;Sidelying to Sit Rolling: Supervision Sidelying to sit: Min guard       General bed mobility comments: cues for technique, assist for trunk  Transfers Overall transfer level: Needs assistance Equipment used: Rolling walker (2 wheeled) Transfers: Sit to/from Stand Sit to Stand: Min guard         General transfer comment: assist for balance  Ambulation/Gait Ambulation/Gait assistance: Min guard Gait Distance (Feet): 150 Feet Assistive device: Rolling walker (2 wheeled) Gait Pattern/deviations: Step-through pattern;Decreased stride length;Decreased weight shift to left     General Gait Details: had walked in room earlier this am to get washed up per NT/wife; assist for balance as over shifting R due to L groin pain with progressing that leg from hernia surgery   Stairs             Wheelchair Mobility     Modified Rankin (Stroke Patients Only)       Balance Overall balance assessment: Needs assistance Sitting-balance support: No upper extremity supported;Feet supported Sitting balance-Leahy Scale: Good     Standing balance support: Bilateral upper extremity supported;No upper extremity supported Standing balance-Leahy Scale: Fair Standing balance comment: static standing without RW, verbally listed proper posture without cues                            Cognition Arousal/Alertness: Awake/alert Behavior During Therapy: WFL for tasks assessed/performed Overall Cognitive Status: Within Functional Limits for tasks assessed                                        Exercises      General Comments General comments (skin integrity, edema, etc.): HR 94, BP 166/111 after ambulation      Pertinent Vitals/Pain Pain Score: 7  Pain Location: abdomen Pain Descriptors / Indicators: Sore;Discomfort Pain Intervention(s): Monitored during session;Repositioned;Ice applied    Home Living                      Prior Function            PT Goals (current goals can now be found in the care plan section) Progress towards PT goals: Progressing toward goals    Frequency    Min 3X/week  PT Plan Current plan remains appropriate    Co-evaluation              AM-PAC PT "6 Clicks" Mobility   Outcome Measure  Help needed turning from your back to your side while in a flat bed without using bedrails?: None Help needed moving from lying on your back to sitting on the side of a flat bed without using bedrails?: A Little Help needed moving to and from a bed to a chair (including a wheelchair)?: A Little Help needed standing up from a chair using your arms (e.g., wheelchair or bedside chair)?: A Little Help needed to walk in hospital room?: A Little Help needed climbing 3-5 steps with a railing? : A Little 6 Click Score: 19    End of Session    Activity Tolerance: Patient tolerated treatment well Patient left: in chair;with call bell/phone within reach;with family/visitor present   PT Visit Diagnosis: Other abnormalities of gait and mobility (R26.89);Pain Pain - Right/Left: Left Pain - part of body: Hip     Time: 5009-3818 PT Time Calculation (min) (ACUTE ONLY): 17 min  Charges:  $Gait Training: 8-22 mins                     Magda Kiel, PT Acute Rehabilitation Services Pager:[phone] Office:[phone] 01/23/2020    [name] 01/23/2020, 12:50 PM

## 2020-01-23 NOTE — Progress Notes (Signed)
6 mg of morphine from PCA pump wasted with Meredeth Ide, RN ON-Q pump thrown in the med box in soiled utility room per charge nurse.

## 2020-01-23 NOTE — Progress Notes (Signed)
    4 Days Post-Op  Subjective: Doing well overall.  Passing "lots" of gas.  No n/v.  Not complaining of hiccups anymore.    Objective: Vital signs in last 24 hours: Temp:  [98.7 F (37.1 C)-99.6 F (37.6 C)] 99.6 F (37.6 C) (10/04 1549) Pulse Rate:  [59-76] 59 (10/04 1549) Resp:  [13-20] 15 (10/04 1549) BP: (139-193)/(94-118) 157/104 (10/04 1549) SpO2:  [94 %-98 %] 98 % (10/04 1549) Last BM Date: 01/25/20  Intake/Output from previous day: 10/03 0701 - 10/04 0700 In: 1917.5 [P.O.:600; I.V.:1317.5] Out: 2100 [Urine:2100] Intake/Output this shift: Total I/O In: 386.3 [P.O.:236; I.V.:150.3] Out: 1500 [Urine:1500]  PE: General: resting comfortably, NAD Neuro: alert and oriented CV: RRR  Resp: normal work of breathing Abdomen: midline incision with clean dressing in place. L inguinal incision clean and dry with no erythema, drainage or induration.  Lab Results:  Recent Labs    01/22/20 0250 01/23/20 0633  WBC 13.6* 8.3  HGB 9.5* 9.3*  HCT 26.8* 27.2*  PLT 180 203   BMET Recent Labs    01/22/20 0250 01/23/20 0633  NA 132* 129*  K 4.2 3.8  CL 102 97*  CO2 23 23  GLUCOSE 111* 114*  BUN 13 13  CREATININE 1.33* 1.15  CALCIUM 8.4* 8.3*   PT/INR No results for input(s): LABPROT, INR in the last 72 hours. CMP     Component Value Date/Time   NA 129 (L) 01/23/2020 0633   NA 136 01/09/2020 1148   K 3.8 01/23/2020 0633   CL 97 (L) 01/23/2020 0633   CO2 23 01/23/2020 0633   GLUCOSE 114 (H) 01/23/2020 0633   BUN 13 01/23/2020 0633   BUN 9 01/09/2020 1148   CREATININE 1.15 01/23/2020 0633   CALCIUM 8.3 (L) 01/23/2020 0633   PROT 5.0 (L) 01/22/2020 0250   ALBUMIN 2.6 (L) 01/22/2020 0250   AST 60 (H) 01/22/2020 0250   ALT 78 (H) 01/22/2020 0250   ALKPHOS 82 01/22/2020 0250   BILITOT 1.1 01/22/2020 0250   GFRNONAA >60 01/23/2020 0633   GFRAA >60 01/23/2020 0633   Lipase     Component Value Date/Time   LIPASE 55 (H) 12/23/2015 0851        Studies/Results: No results found.  Anti-infectives: Anti-infectives (From admission, onward)   Start     Dose/Rate Route Frequency Ordered Stop   01/19/20 0645  ceFAZolin (ANCEF) IVPB 2g/100 mL premix        2 g 200 mL/hr over 30 Minutes Intravenous On call to O.R. 01/19/20 8937 01/19/20 1145       Assessment/Plan 74 yo male POD4 s/p resection of right retroperitoneal mass and left inguinal hernia repair. - Saline lock D/c pca Advance to soft diet. - Ambulate, pulmonary toilet - Hypertension: start home ace today since cr back to normal.   D/c cardiac monitoring.   Anticipate home in next 1-2 days.   VTE - SQH, SCDs Dispo - inpatient, progressive care   LOS: 4 days  01/23/20 6:43 PM  Milus Height, MD FACS Surgical Oncology, General Surgery, Trauma and [place] [facility], [place] for weekday/non holidays Check [email or web address] for coverage night/weekend/holidays  Do not use SecureChat as it is not reliable for timely patient care.

## 2020-01-24 LAB — CBC WITH DIFFERENTIAL/PLATELET
Abs Immature Granulocytes: 0.09 10*3/uL — ABNORMAL HIGH (ref 0.00–0.07)
Basophils Absolute: 0 10*3/uL (ref 0.0–0.1)
Basophils Relative: 0 %
Eosinophils Absolute: 0 10*3/uL (ref 0.0–0.5)
Eosinophils Relative: 1 %
HCT: 29.8 % — ABNORMAL LOW (ref 39.0–52.0)
Hemoglobin: 10.3 g/dL — ABNORMAL LOW (ref 13.0–17.0)
Immature Granulocytes: 1 %
Lymphocytes Relative: 11 %
Lymphs Abs: 0.7 10*3/uL (ref 0.7–4.0)
MCH: 32.2 pg (ref 26.0–34.0)
MCHC: 34.6 g/dL (ref 30.0–36.0)
MCV: 93.1 fL (ref 80.0–100.0)
Monocytes Absolute: 0.7 10*3/uL (ref 0.1–1.0)
Monocytes Relative: 11 %
Neutro Abs: 4.8 10*3/uL (ref 1.7–7.7)
Neutrophils Relative %: 76 %
Platelets: 263 10*3/uL (ref 150–400)
RBC: 3.2 MIL/uL — ABNORMAL LOW (ref 4.22–5.81)
RDW: 11.8 % (ref 11.5–15.5)
WBC: 6.3 10*3/uL (ref 4.0–10.5)
nRBC: 0 % (ref 0.0–0.2)

## 2020-01-24 LAB — BASIC METABOLIC PANEL
Anion gap: 10 (ref 5–15)
BUN: 17 mg/dL (ref 8–23)
CO2: 27 mmol/L (ref 22–32)
Calcium: 8.7 mg/dL — ABNORMAL LOW (ref 8.9–10.3)
Chloride: 94 mmol/L — ABNORMAL LOW (ref 98–111)
Creatinine, Ser: 1.34 mg/dL — ABNORMAL HIGH (ref 0.61–1.24)
GFR calc non Af Amer: 52 mL/min — ABNORMAL LOW (ref >60)
Glucose, Bld: 114 mg/dL — ABNORMAL HIGH (ref 70–99)
Potassium: 3.6 mmol/L (ref 3.5–5.1)
Sodium: 131 mmol/L — ABNORMAL LOW (ref 135–145)

## 2020-01-24 LAB — GLUCOSE, CAPILLARY
Glucose-Capillary: 123 mg/dL — ABNORMAL HIGH (ref 70–99)
Glucose-Capillary: 68 mg/dL — ABNORMAL LOW (ref 70–99)
Glucose-Capillary: 102 mg/dL — ABNORMAL HIGH (ref 70–99)

## 2020-01-24 MED ORDER — LISINOPRIL 20 MG PO TABS
20.0000 mg | ORAL_TABLET | Freq: Every day | ORAL | 0 refills | Status: DC
Start: 2020-01-25 — End: 2020-04-25

## 2020-01-24 MED ORDER — INFLUENZA VAC A&B SA ADJ QUAD 0.5 ML IM PRSY
0.5000 mL | PREFILLED_SYRINGE | INTRAMUSCULAR | Status: AC
  Administered 2020-01-24: 0.5 mL via INTRAMUSCULAR
  Filled 2020-01-24: qty 0.5

## 2020-01-24 MED ORDER — BISACODYL 10 MG RE SUPP
10.0000 mg | Freq: Every day | RECTAL | Status: DC
  Administered 2020-01-24: 10 mg via RECTAL
  Filled 2020-01-24: qty 1

## 2020-01-24 MED ORDER — ACETAMINOPHEN 325 MG PO TABS: 325.0000 mg | ORAL_TABLET | Freq: Four times a day (QID) | ORAL | Status: DC | PRN

## 2020-01-24 MED ORDER — METOPROLOL TARTRATE 25 MG PO TABS
12.5000 mg | ORAL_TABLET | Freq: Two times a day (BID) | ORAL | 0 refills | Status: DC
Start: 2020-01-24 — End: 2020-04-25

## 2020-01-24 MED ORDER — LISINOPRIL 20 MG PO TABS
20.0000 mg | ORAL_TABLET | Freq: Every day | ORAL | Status: DC
  Administered 2020-01-24: 20 mg via ORAL

## 2020-01-24 NOTE — Progress Notes (Signed)
Hypoglycemic Event  CBG:68  Treatment: 8 oz juice/soda  Symptoms: None  Follow-up CBG: Time:1409 CBG Result:102  Possible Reasons for Event: Inadequate meal intake  Comments/MD notified: Hypoglycemic protocol.    Tom-Johnson, Renea Ee

## 2020-01-24 NOTE — Discharge Summary (Signed)
Physician Discharge Summary  Patient ID: William Avila MRN: [id number] DOB/AGE: 1945/10/24 74 y.o.  Admit date: 01/19/2020 Discharge date: 01/24/2020  Admission Diagnoses: Retroperitoneal mass HTN  Discharge Diagnoses:  Active Problems:   Retroperitoneal mass-large calcified mass, present since 2015 Mild acute kidney injury HTN  Discharged Condition: stable  Hospital Course:  Pt underwent open resection of right retroperitoneal mass and LIH repair with mesh 01/19/2020. This involved a large calcified mass posterior and superior to the kidney as well several other nodular areas in the pararenal fat.  It also extended superomedially to behind the IVC.  He had quite a bit of pain at first, but this improved.  He also had some nausea which was not unexpected as the duodenum had to be manipulated quite a bit during surgery.  He also had a small amount of liver capsule removed, so had a bump in his LFTs briefly.  He has h/o atrial tachycardia and given hypertension, he was placed on IV metoprolol.    His nausea resolved and he was able to began eating.  His diet was advanced without issue.  He was able to pass gas easily.  He was given a suppository as well.  He was saline locked and switched to oral narcotics  Home antihypertensives were added back.  His IV meds were taken off. He was easily ambulatory.  He had no evidence of midline wound infection.    Consults: None  Significant Diagnostic Studies: labs: Cr max 1.64, down to 1.3 prior to discharge.    Treatments: surgery: as above  Discharge Exam: Blood pressure (!) 172/107, pulse 67, temperature 98.3 F (36.8 C), temperature source Oral, resp. rate 20, height 5\' 11"  (1.803 m), weight 79.1 kg, SpO2 97 %. General appearance: alert, cooperative and no distress Resp: breathing comfortably Cardio: regular rate and rhythm GI: soft, non tender, non distended.  wound c/d/i.  some hematoma at hernia site on left. Extremities: extremities  normal, atraumatic, no cyanosis or edema  Disposition:   Discharge Instructions    Call MD for:  difficulty breathing, headache or visual disturbances   Complete by: As directed    Call MD for:  difficulty breathing, headache or visual disturbances   Complete by: As directed    Call MD for:  hives   Complete by: As directed    Call MD for:  persistant dizziness or light-headedness   Complete by: As directed    Call MD for:  persistant nausea and vomiting   Complete by: As directed    Call MD for:  persistant nausea and vomiting   Complete by: As directed    Call MD for:  redness, tenderness, or signs of infection (pain, swelling, redness, odor or green/yellow discharge around incision site)   Complete by: As directed    Call MD for:  redness, tenderness, or signs of infection (pain, swelling, redness, odor or green/yellow discharge around incision site)   Complete by: As directed    Call MD for:  severe uncontrolled pain   Complete by: As directed    Call MD for:  severe uncontrolled pain   Complete by: As directed    Call MD for:  temperature >100.4   Complete by: As directed    Call MD for:  temperature >100.4   Complete by: As directed    Diet - low sodium heart healthy   Complete by: As directed    Diet - low sodium heart healthy   Complete by: As directed  Increase activity slowly   Complete by: As directed    Increase activity slowly   Complete by: As directed      Allergies as of 01/24/2020      Reactions   Codeine Anaphylaxis   Ibuprofen Anaphylaxis   Robaxin [methocarbamol] Anaphylaxis, Hives, Swelling   Chlorhexidine    Fentanyl And Related    Dry heaves (lasted for 14 hours)   Tramadol Hcl    Changes personality, makes him angry      Medication List    TAKE these medications   acetaminophen 500 MG tablet Commonly known as: TYLENOL Take 500 mg by mouth every 6 (six) hours as needed for mild pain.   diazepam 2 MG tablet Commonly known as:  VALIUM Take 1 tablet (2 mg total) by mouth every 8 (eight) hours as needed for muscle spasms.   hydroxypropyl methylcellulose / hypromellose 2.5 % ophthalmic solution Commonly known as: ISOPTO TEARS / GONIOVISC Place 1 drop into both eyes 3 (three) times daily as needed for dry eyes.   lisinopril 10 MG tablet Commonly known as: ZESTRIL TAKE 1 TABLET BY MOUTH EVERY DAY   lisinopril 20 MG tablet Commonly known as: ZESTRIL Take 1 tablet (20 mg total) by mouth daily. Start taking on: January 25, 2020   metoprolol tartrate 25 MG tablet Commonly known as: LOPRESSOR Take 0.5 tablets (12.5 mg total) by mouth 2 (two) times daily.   multivitamin with minerals tablet Take 1 tablet by mouth daily.   oxyCODONE 5 MG immediate release tablet Commonly known as: Oxy IR/ROXICODONE Take 1 tablet (5 mg total) by mouth every 6 (six) hours as needed for severe pain.       Follow-up Information    Stark Klein, MD Follow up in 2 week(s).   Specialty: General Surgery Contact information: [address] [place] [address] [phone]        [name], MD Follow up in 3 week(s).   Specialty: Internal Medicine Why: HTN Contact information: [address] [place] [address] [phone]               Signed: Stark Klein 01/24/2020, 11:16 AM

## 2020-01-24 NOTE — Progress Notes (Addendum)
Patient is discharged from room 4NP05 at this time. Alert and in stable condition. IV sites d/c'd and instructions read to patient and spouse with understanding verbalized and all questions answered. Left unit via wheelchair with all belongings at side.

## 2020-01-24 NOTE — Progress Notes (Signed)
Physical Therapy Treatment Patient Details Name: William Avila MRN: [id number] DOB: [birth year] Today's Date: 01/24/2020    History of Present Illness 74 yo male s/p radical resection of retroperitoneal mass concerning for liposarcoma, L inguinal hernia repair on 9/30. PMH includes lumbar and cervical fusion, lap hiatial hernia, lap appy, HTN.    PT Comments    Patient reports increased edema at hernia incision site.  Educated on use of ice and compression with ace wraps utilized during session and on removing when in bed.  Patient able to negotiate steps appropriate for home entry.  Seems stable from mobility perspective when medically ready for d/c.  PT to follow while inpatient.    Follow Up Recommendations  Supervision for mobility/OOB;No PT follow up     Equipment Recommendations  None recommended by PT    Recommendations for Other Services       Precautions / Restrictions Precautions Precautions: Fall Precaution Comments: swollen groin incision @ hernia repair site    Mobility  Bed Mobility     Rolling: Supervision Sidelying to sit: Supervision       General bed mobility comments: assist for safety, increased time  Transfers Overall transfer level: Needs assistance Equipment used: Rolling walker (2 wheeled) Transfers: Sit to/from Stand Sit to Stand: Min guard         General transfer comment: steadying assist, slow but controlled to descend into chair  Ambulation/Gait Ambulation/Gait assistance: Min guard;Supervision Gait Distance (Feet): 200 Feet Assistive device: Rolling walker (2 wheeled) Gait Pattern/deviations: Step-through pattern;Decreased stride length;Wide base of support     General Gait Details: mild antalgia on L and wider BOS wearing ace wraps today   Stairs Stairs: Yes Stairs assistance: Min guard;Supervision Stair Management: One rail Right;Step to pattern;Forwards Number of Stairs: 5 General stair comments: simulated home  entry, cues for step to pattern due to L groin incision   Wheelchair Mobility    Modified Rankin (Stroke Patients Only)       Balance Overall balance assessment: Needs assistance Sitting-balance support: Feet supported Sitting balance-Leahy Scale: Good     Standing balance support: No upper extremity supported Standing balance-Leahy Scale: Fair Standing balance comment: balanced without RW while getting chair in place                            Cognition Arousal/Alertness: Awake/alert Behavior During Therapy: WFL for tasks assessed/performed Overall Cognitive Status: Within Functional Limits for tasks assessed                                        Exercises      General Comments General comments (skin integrity, edema, etc.): Noted edema around groin incision, pt reports was bigger till he expelled some gas; wrapped 2 4" ace wraps around hips then through L groin area, educated to leave on while in chair, take off in bed      Pertinent Vitals/Pain Pain Score: 6  Pain Location: groin, abdomen Pain Descriptors / Indicators: Sore;Operative site guarding;Tightness;Tender Pain Intervention(s): Monitored during session;Repositioned;Ice applied;Other (comment) (ace wrap)    Home Living                      Prior Function            PT Goals (current goals can now be found in the care plan section)  Progress towards PT goals: Progressing toward goals    Frequency    Min 3X/week      PT Plan Current plan remains appropriate    Co-evaluation              AM-PAC PT "6 Clicks" Mobility   Outcome Measure  Help needed turning from your back to your side while in a flat bed without using bedrails?: None Help needed moving from lying on your back to sitting on the side of a flat bed without using bedrails?: None Help needed moving to and from a bed to a chair (including a wheelchair)?: A Little Help needed standing up from  a chair using your arms (e.g., wheelchair or bedside chair)?: A Little Help needed to walk in hospital room?: A Little Help needed climbing 3-5 steps with a railing? : A Little 6 Click Score: 20    End of Session   Activity Tolerance: Patient tolerated treatment well Patient left: in chair;with call bell/phone within reach   PT Visit Diagnosis: Other abnormalities of gait and mobility (R26.89);Pain Pain - Right/Left: Left Pain - part of body: Hip     Time: 0920-0946 PT Time Calculation (min) (ACUTE ONLY): 26 min  Charges:  $Gait Training: 8-22 mins $Self Care/Home Management: 8-22                     William Avila, PT Acute Rehabilitation Services [phone] Office:[phone] 01/24/2020    William Avila 01/24/2020, 10:01 AM

## 2020-01-24 NOTE — Care Management Important Message (Signed)
Important Message  Patient Details  Name: William Avila MRN: [id number] Date of Birth: 06/25/1945   Medicare Important Message Given:  Yes     Dhruv Christina [address] 01/24/2020, 4:35 PM

## 2020-01-24 NOTE — Progress Notes (Signed)
    5 Days Post-Op  Subjective: Doing well.  No n/v.    Objective: Vital signs in last 24 hours: Temp:  [98.3 F (36.8 C)-99.6 F (37.6 C)] 98.3 F (36.8 C) (10/05 0803) Pulse Rate:  [59-70] 67 (10/05 0803) Resp:  [14-20] 20 (10/05 0803) BP: (143-175)/(92-115) 172/107 (10/05 0803) SpO2:  [96 %-98 %] 97 % (10/05 0803) Last BM Date: 01/25/20  Intake/Output from previous day: 10/04 0701 - 10/05 0700 In: 386.3 [P.O.:236; I.V.:150.3] Out: 2200 [Urine:2200] Intake/Output this shift: Total I/O In: -  Out: 275 [Urine:275]  PE: General: resting comfortably, NAD Neuro: alert and oriented CV: RRR  Resp: normal work of breathing Abdomen: midline incision/d/i L inguinal incision clean and dry with no erythema, drainage or induration. There is some bruising and swelling.    Lab Results:  Recent Labs    01/22/20 0250 01/23/20 0633  WBC 13.6* 8.3  HGB 9.5* 9.3*  HCT 26.8* 27.2*  PLT 180 203   BMET Recent Labs    01/22/20 0250 01/23/20 0633  NA 132* 129*  K 4.2 3.8  CL 102 97*  CO2 23 23  GLUCOSE 111* 114*  BUN 13 13  CREATININE 1.33* 1.15  CALCIUM 8.4* 8.3*   PT/INR No results for input(s): LABPROT, INR in the last 72 hours. CMP     Component Value Date/Time   NA 129 (L) 01/23/2020 0633   NA 136 01/09/2020 1148   K 3.8 01/23/2020 0633   CL 97 (L) 01/23/2020 0633   CO2 23 01/23/2020 0633   GLUCOSE 114 (H) 01/23/2020 0633   BUN 13 01/23/2020 0633   BUN 9 01/09/2020 1148   CREATININE 1.15 01/23/2020 0633   CALCIUM 8.3 (L) 01/23/2020 0633   PROT 5.0 (L) 01/22/2020 0250   ALBUMIN 2.6 (L) 01/22/2020 0250   AST 60 (H) 01/22/2020 0250   ALT 78 (H) 01/22/2020 0250   ALKPHOS 82 01/22/2020 0250   BILITOT 1.1 01/22/2020 0250   GFRNONAA >60 01/23/2020 0633   GFRAA >60 01/23/2020 0633   Lipase     Component Value Date/Time   LIPASE 55 (H) 12/23/2015 0851       Studies/Results: No results found.  Anti-infectives: Anti-infectives (From admission, onward)    Start     Dose/Rate Route Frequency Ordered Stop   01/19/20 0645  ceFAZolin (ANCEF) IVPB 2g/100 mL premix        2 g 200 mL/hr over 30 Minutes Intravenous On call to O.R. 01/19/20 3716 01/19/20 1145       Assessment/Plan 74 yo male POD5 s/p resection of right retroperitoneal mass and left inguinal hernia repair. Pathology pending.  - Saline lock  Soft diet as tolerated.    - Ambulate, pulmonary toilet  - Hypertension: increase home ACE inhibitor and PO metoprolol added today.    Anticipate home later today if labs ok.  VTE - SQH, SCDs  Dispo - inpatient, floor status.      LOS: 5 days  01/24/20 9:29 AM  Milus Height, MD FACS Surgical Oncology, General Surgery, Trauma and [place] [facility], [place] for weekday/non holidays Check [email or web address] for coverage night/weekend/holidays  Do not use SecureChat as it is not reliable for timely patient care.

## 2020-01-25 LAB — TYPE AND SCREEN
ABO/RH(D): O NEG
Antibody Screen: NEGATIVE
Unit division: 0
Unit division: 0
Unit division: 0
Unit division: 0

## 2020-01-25 LAB — BPAM RBC
Blood Product Expiration Date: 202110082359
Blood Product Expiration Date: 202110092359
Blood Product Expiration Date: 202110172359
Blood Product Expiration Date: 202110172359
ISSUE DATE / TIME: 202110052208
ISSUE DATE / TIME: 202110060200
Unit Type and Rh: 9500
Unit Type and Rh: 9500
Unit Type and Rh: 9500
Unit Type and Rh: 9500

## 2020-02-02 LAB — SURGICAL PATHOLOGY

## 2020-02-03 ENCOUNTER — Encounter (HOSPITAL_COMMUNITY): Payer: Self-pay

## 2020-02-07 ENCOUNTER — Other Ambulatory Visit: Payer: Self-pay | Admitting: General Surgery

## 2020-02-07 ENCOUNTER — Other Ambulatory Visit (HOSPITAL_COMMUNITY): Payer: Self-pay | Admitting: General Surgery

## 2020-02-07 ENCOUNTER — Other Ambulatory Visit: Payer: Self-pay

## 2020-02-07 ENCOUNTER — Ambulatory Visit (HOSPITAL_COMMUNITY)
Admission: RE | Admit: 2020-02-07 | Discharge: 2020-02-07 | Disposition: A | Discharge status: Home / Self Care | Payer: Medicare HMO | Source: Ambulatory Visit | Attending: General Surgery | Admitting: General Surgery

## 2020-02-07 DIAGNOSIS — C499 Malignant neoplasm of connective and soft tissue, unspecified: Secondary | ICD-10-CM | POA: Diagnosis not present

## 2020-02-07 DIAGNOSIS — C480 Malignant neoplasm of retroperitoneum: Secondary | ICD-10-CM

## 2020-02-07 DIAGNOSIS — J9811 Atelectasis: Secondary | ICD-10-CM | POA: Diagnosis not present

## 2020-02-07 DIAGNOSIS — I700 Atherosclerosis of aorta: Secondary | ICD-10-CM | POA: Diagnosis not present

## 2020-02-07 MED ORDER — IOHEXOL 300 MG/ML  SOLN
75.0000 mL | Freq: Once | INTRAMUSCULAR | Status: AC | PRN
  Administered 2020-02-07: 75 mL via INTRAVENOUS

## 2020-02-08 NOTE — Progress Notes (Signed)
GI Location of Tumor / Histology: Right retroperitoneal mass  William Avila presented with left lower quadrant pain and pain in his groin.  He was concerned that he had a hernia or a cyst.  He was seen by his PCP who ordered CT scan.  CT Chest 02/07/2020: Postoperative changes are noted in the right retroperitoneal region consistent with recent surgical resection of liposarcoma in this area. There appears to be lack of enhancement of the medial portion of the right kidney concerning for renal infarction.  CT abd/pelvis 12/05/2019:  new 11 cm fat density mass containing calcification stranding and nodularity posterior to the right kidney with greater anterior displacement of the right kidney compared to the prior study. 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  Unchanged 9.8 cm retroperitoneal calcification.  New small fat containing LIH.  Biopsies of Retroperitoneal Mass 01/19/2020   Past/Anticipated interventions by surgeon, if any: Dr. Barry Dienes 02/06/2020 -Patient underwent open left inguinal hernia with mesh and radical resection of right retroperitoneal mass 01/19/2020. -Path was sent to [facility] and was felt to be differentiated and high grade with positive margins. -Will refer to radiation oncology. -Staples removed.  Past/Anticipated interventions by medical oncology, if any:   Weight changes, if any: lost about 12 pounds since this started.  He states the mass that was removed weighed about 5 lbs.  Bowel/Bladder complaints, if any: He states he is becoming more regular now.  Nausea / Vomiting, if any: None  Pain issues, if any:  Has mild discomfort from the hernia surgery.  He states he has no pain from the mass removal.   SAFETY ISSUES:  Prior radiation? No  Pacemaker/ICD? No  Possible current pregnancy? n/a  Is the patient on methotrexate? no  Current Complaints/Details:

## 2020-02-09 ENCOUNTER — Encounter: Payer: Self-pay | Admitting: Radiation Oncology

## 2020-02-09 ENCOUNTER — Ambulatory Visit
Admission: RE | Admit: 2020-02-09 | Discharge: 2020-02-09 | Disposition: A | Discharge status: Home / Self Care | Payer: Medicare HMO | Source: Ambulatory Visit | Attending: Radiation Oncology | Admitting: Radiation Oncology

## 2020-02-09 ENCOUNTER — Encounter: Payer: Self-pay | Admitting: Licensed Clinical Social Worker

## 2020-02-09 ENCOUNTER — Other Ambulatory Visit: Payer: Self-pay

## 2020-02-09 VITALS — Ht 72.0 in | Wt 157.0 lb

## 2020-02-09 DIAGNOSIS — C480 Malignant neoplasm of retroperitoneum: Secondary | ICD-10-CM | POA: Diagnosis not present

## 2020-02-09 NOTE — Progress Notes (Signed)
I called the patient to review the discussion Dr. Barry Dienes and Dr. Lisbeth Renshaw had about his case. While surgery is not prohibitive, it may be difficult if he had a recurrence based on the location to offer more surgery. While NCCN guidelines have moved away from postoperative radiotherapy, and more toward watch and wait approach or systemic therapy, Dr. Alen Blew has not recommended systemic therapy, and hence with these two considerations in a patient with a high grade tumor, and positive margins, Dr. Lisbeth Renshaw is more inclined to offer adjuvant radiotherapy for purposes of local control. The patient is more comfortable moving forward with radiotherapy and is given a simulation appt for next Wednesday. We will start his 5 1/2 week regimen of daily IMRT treatment the first week of November.    Carola Rhine, PAC

## 2020-02-09 NOTE — Progress Notes (Signed)
Thanks Dr. Barry Dienes, it sounds like a tough situation to be able to anticipate surgical options in the future, and with the high grade feature, Dr. Lisbeth Renshaw was in favor of proceeding with radiation. The patient is in agreement as well. Dr. Alen Blew- not sure if this pt is someone you'd like to see as well. I think one of the navigators was reaching out but just wanted to confirm myself. Thanks, Bryson Ha

## 2020-02-09 NOTE — Progress Notes (Signed)
Radiation Oncology         [phone] ________________________________  Initial Outpatient Consultation - Conducted via telephone due to current COVID-19 concerns for limiting patient exposure  I spoke with the patient to conduct this consult visit via telephone to spare the patient unnecessary potential exposure in the healthcare setting during the current COVID-19 pandemic. The patient was notified in advance and was offered a [place] meeting to allow for face to face communication but unfortunately reported that they did not have the appropriate resources/technology to support such a visit and instead preferred to proceed with a telephone consult. .  Name: William Avila        MRN: [id number]  Date of Service: 02/09/2020 DOB: 04-Jan-1946  WU:[id number], William Reichmann, MD  William Klein, MD     REFERRING PHYSICIAN: Stark Klein, MD   DIAGNOSIS: The encounter diagnosis was Liposarcoma, retroperitoneal [facility]).   HISTORY OF PRESENT ILLNESS: William Avila is a 74 y.o. male seen at the request of William Avila for any recently diagnosed liposarcoma of the retroperitoneum.  Apparently the patient has had this mass for several years, and had been followed by his PCP William Avila for this as this has been stable. Recent imaging however in August 2021 revealed new fat density along the mass that measured 11 x 11.1 cm with calcifications stranding and nodularity with in the mass which was posterior to the right kidney and anterior displacement of the right kidney, there was persistent unchanged 9.8 x 8.8 cm retroperitoneal calcification superior to the kidney, and a new small fat-containing left inguinal hernia.  He also had stigmata of atherosclerotic disease.  He met with William Avila and subsequently underwent left inguinal hernia repair with mesh and radical resection of the right retroperitoneal mass.  He met postoperatively with William Avila, and was having improvement of his swollen left groin but still  tenderness.  He also has since undergone a CT of the chest for staging on 02/07/2020, this captured postoperative changes in the right retroperitoneal region, and no other abnormality seen in the chest of concern other than his known stigmata of aortic atherosclerotic disease.  He is contacted today to discuss the options of adjuvant radiotherapy given that his pathology was reviewed in an outside facility at [facility] and felt to be consistent with a high-grade liposarcoma with differentiation and positive margins.   PREVIOUS RADIATION THERAPY: No   PAST MEDICAL HISTORY:  Past Medical History:  Diagnosis Date  . Acute appendicitis 01/21/2014  . Arthritis   . Dysrhythmia    PSVT, bradycardia  . History of hiatal hernia   . Hypertension        PAST SURGICAL HISTORY: Past Surgical History:  Procedure Laterality Date  . BACK SURGERY     and neck and trigger finger surgery  . CERVICAL SPINE SURGERY    . ESOPHAGOGASTRODUODENOSCOPY (EGD) WITH PROPOFOL Left 12/24/2015   Procedure: ESOPHAGOGASTRODUODENOSCOPY (EGD) WITH PROPOFOL;  Surgeon: Otis Brace, MD;  Location: [place];  Service: Gastroenterology;  Laterality: Left;  . HIATAL HERNIA REPAIR N/A 12/26/2015   Procedure: LAPAROSCOPIC REPAIR OF HIATAL HERNIA WITH MESH;  Surgeon: Michael Boston, MD;  Location: [place];  Service: General;  Laterality: N/A;  . INGUINAL HERNIA REPAIR Left 01/19/2020   Procedure: LEFT INGUINAL HERNIA REPAIR WITH MESH;  Surgeon: William Klein, MD;  Location: [place];  Service: General;  Laterality: Left;  . INSERTION OF MESH Left 01/19/2020   Procedure: INSERTION OF MESH;  Surgeon: William Klein, MD;  Location: [facility];  Service: General;  Laterality: Left;  . LAPAROSCOPIC APPENDECTOMY N/A 01/21/2014   Procedure: APPENDECTOMY LAPAROSCOPIC;  Surgeon: Autumn Messing III, MD;  Location: [place];  Service: General;  Laterality: N/A;  . RESECTION OF RETROPERITONEAL MASS N/A 01/19/2020   Procedure: RADICAL RESECTION OF  MALIGNANT RIGHT RETROPERITONEAL MASS;  Surgeon: William Klein, MD;  Location: [place];  Service: General;  Laterality: N/A;  . TRIGGER FINGER RELEASE       FAMILY HISTORY: No family history on file.   SOCIAL HISTORY:  reports that he has never smoked. He has never used smokeless tobacco. He reports current alcohol use. He reports that he does not use drugs.  The patient is married and lives in [place].  He is retired from working as a Games developer but has been on medical disability due to spinal stenosis.    ALLERGIES: Codeine, Ibuprofen, Robaxin [methocarbamol], Chlorhexidine, Fentanyl and related, and Tramadol hcl   MEDICATIONS:  Current Outpatient Medications  Medication Sig Dispense Refill  . acetaminophen (TYLENOL) 500 MG tablet Take 500 mg by mouth every 6 (six) hours as needed for mild pain.     . diazepam (VALIUM) 2 MG tablet Take 1 tablet (2 mg total) by mouth every 8 (eight) hours as needed for muscle spasms. 15 tablet 0  . hydroxypropyl methylcellulose / hypromellose (ISOPTO TEARS / GONIOVISC) 2.5 % ophthalmic solution Place 1 drop into both eyes 3 (three) times daily as needed for dry eyes.    Marland Kitchen lisinopril (ZESTRIL) 10 MG tablet TAKE 1 TABLET BY MOUTH EVERY DAY 90 tablet 3  . lisinopril (ZESTRIL) 20 MG tablet Take 1 tablet (20 mg total) by mouth daily. 30 tablet 0  . metoprolol tartrate (LOPRESSOR) 25 MG tablet Take 0.5 tablets (12.5 mg total) by mouth 2 (two) times daily. 15 tablet 0  . Multiple Vitamins-Minerals (MULTIVITAMIN WITH MINERALS) tablet Take 1 tablet by mouth daily.    Marland Kitchen oxyCODONE (OXY IR/ROXICODONE) 5 MG immediate release tablet Take 1 tablet (5 mg total) by mouth every 6 (six) hours as needed for severe pain. 15 tablet 0   No current facility-administered medications for this encounter.     REVIEW OF SYSTEMS: On review of systems, the patient reports that he is doing well overall. He has some soreness in He denies any chest pain, shortness  of breath, cough, fevers, chills, night sweats, unintended weight changes. He denies any bowel or bladder disturbances, and denies abdominal pain, nausea or vomiting. He denies any new musculoskeletal or joint aches or pains. A complete review of systems is obtained and is otherwise negative.     PHYSICAL EXAM:  Wt Readings from Last 3 Encounters:  01/19/20 174 lb 6.1 oz (79.1 kg)  01/16/20 174 lb 4.8 oz (79.1 kg)  01/09/20 173 lb (78.5 kg)   Unable to assess given encounter type.  ECOG = 0  0 - Asymptomatic (Fully active, able to carry on all predisease activities without restriction)  1 - Symptomatic but completely ambulatory (Restricted in physically strenuous activity but ambulatory and able to carry out work of a light or sedentary nature. For example, light housework, office work)  2 - Symptomatic, <50% in bed during the day (Ambulatory and capable of all self care but unable to carry out any work activities. Up and about more than 50% of waking hours)  3 - Symptomatic, >50% in bed, but not bedbound (Capable of only limited self-care, confined to bed or chair 50% or more  of waking hours)  4 - Bedbound (Completely disabled. Cannot carry on any self-care. Totally confined to bed or chair)  5 - Death   William Avila MM, Creech RH, Tormey DC, et al. [phone]). "Toxicity and response criteria of the [facility]". [place] Oncol. 5 (6): 649-55    LABORATORY DATA:  Lab Results  Component Value Date   WBC 6.3 01/24/2020   HGB 10.3 (L) 01/24/2020   HCT 29.8 (L) 01/24/2020   MCV 93.1 01/24/2020   PLT 263 01/24/2020   Lab Results  Component Value Date   NA 131 (L) 01/24/2020   K 3.6 01/24/2020   CL 94 (L) 01/24/2020   CO2 27 01/24/2020   Lab Results  Component Value Date   ALT 78 (H) 01/22/2020   AST 60 (H) 01/22/2020   ALKPHOS 82 01/22/2020   BILITOT 1.1 01/22/2020      RADIOGRAPHY: CT CHEST W CONTRAST  Result Date: 02/07/2020 CLINICAL DATA:   Status post surgery for liposarcoma. EXAM: CT CHEST WITH CONTRAST TECHNIQUE: Multidetector CT imaging of the chest was performed during intravenous contrast administration. CONTRAST:  40m OMNIPAQUE IOHEXOL 300 MG/ML  SOLN COMPARISON:  February 04, 2020. FINDINGS: Cardiovascular: Atherosclerosis of thoracic aorta is noted without aneurysm or dissection. Normal cardiac size. No pericardial effusion. Mediastinum/Nodes: No enlarged mediastinal, hilar, or axillary lymph nodes. Thyroid gland, trachea, and esophagus demonstrate no significant findings. Lungs/Pleura: No pneumothorax or pleural effusion is noted. Minimal bibasilar subsegmental atelectasis or scarring is noted. Upper Abdomen: Postoperative changes are noted in the right retroperitoneal region consistent with recent surgical resection of liposarcoma in this area. There appears to be lack of enhancement of the medial portion of the right kidney concerning for some degree of infarction. Musculoskeletal: No chest wall abnormality. No acute or significant osseous findings. IMPRESSION: 1. Postoperative changes are noted in the right retroperitoneal region consistent with recent surgical resection of liposarcoma in this area. There appears to be lack of enhancement of the medial portion of the right kidney concerning for renal infarction. These results will be called to the ordering clinician or representative by the Radiologist Assistant, and communication documented in the PACS or zVision Dashboard. 2. No other significant abnormality seen in the chest. 3. Aortic atherosclerosis. Aortic Atherosclerosis (ICD10-I70.0). Electronically Signed   By: William ConceptionM.D.   On: 02/07/2020 11:52       IMPRESSION/PLAN: 1. High-grade liposarcoma of the retroperitoneum with positive margins at the time of resection.  Dr. MLisbeth Renshawdiscusses the findings and reviews the conversation about his case in multidisciplinary GI oncology conference.  He appears to have high risk  features on his final pathology specifically with positive margin. NCCN guidelines were reviewed and Dr. MLisbeth Renshawdiscussed the scenarios for which radiotherapy can be given. In some cases with high risk features postoperative radiotherapy could be considered, but it could also be considered if recurrence was noted and given preoperatively, prior to re-excision. Dr. MLisbeth Renshawreviews the risks, benefits, short-term and long-term effects of radiotherapy however, and plans to follow up again with Dr. BBarry Dienesin conversation to see if he has any further surgical options if he had recurrence. If not, Dr. MLisbeth Renshawwould favor radiotherapy now.  If radiotherapy were to be given, Dr. MLisbeth Renshawwould consider a course of 6 weeks of daily radiotherapy to the retroperitoneum.  We will follow up with the patient after Dr. MLisbeth Renshawand Dr. BBarry Dieneshave had a chance to review his case again.  Given current concerns for  patient exposure during the COVID-19 pandemic, this encounter was conducted via telephone.  The patient has provided two factor identification and has given verbal consent for this type of encounter and has been advised to only accept a meeting of this type in a secure network environment. The time spent during this encounter was 60 minutes including preparation, discussion, and coordination of the patient's care. The attendants for this meeting include Blenda Nicely, RN, Dr. Lisbeth Avila, Hayden Pedro  and Isac Sarna and Veva Holes.  During the encounter,  Blenda Nicely, RN, Dr. Lisbeth Avila, and Hayden Pedro were located at [facility] Radiation Oncology Department.  Rajiv L Daigler was located at home with his wife Hoyle Sauer.    The above documentation reflects my direct findings during this shared patient visit. Please see the separate note by Dr. Lisbeth Avila on this date for the remainder of the patient's plan of care.    Carola Rhine, PAC

## 2020-02-09 NOTE — Progress Notes (Signed)
[place] Psychosocial Distress Screening Clinical Social Work  Clinical Social Work was referred by distress screening protocol.  The patient scored a 5 on the Psychosocial Distress Thermometer which indicates moderate distress. Clinical Social Worker attempted to contact patient by phone to assess for distress and other psychosocial needs.  No answer. Left VM on home number with direct contact information.  ONCBCN DISTRESS SCREENING 02/09/2020  Screening Type Initial Screening  Distress experienced in past week (1-10) 5  Emotional problem type Adjusting to illness  Information Concerns Type Lack of info about treatment;Lack of info about diagnosis  Physical Problem type Other (comment);Pain  Other Contact via phone     Aylana Hirschfeld E, LCSW

## 2020-02-10 NOTE — Progress Notes (Signed)
[facility] Clinical Social Work  Second attempt to contact patient by phone to follow-up on distress screen. No answer. CSW may be re-consulted as needed in the future.   William Avila Anshika Pethtel, LCSW

## 2020-02-10 NOTE — Progress Notes (Signed)
I will be happy to see him at any point. Thanks

## 2020-02-15 ENCOUNTER — Ambulatory Visit
Admission: RE | Admit: 2020-02-15 | Discharge: 2020-02-15 | Disposition: A | Discharge status: Home / Self Care | Payer: Medicare HMO | Source: Ambulatory Visit | Attending: Radiation Oncology | Admitting: Radiation Oncology

## 2020-02-15 DIAGNOSIS — C480 Malignant neoplasm of retroperitoneum: Secondary | ICD-10-CM | POA: Insufficient documentation

## 2020-02-24 DIAGNOSIS — C480 Malignant neoplasm of retroperitoneum: Secondary | ICD-10-CM | POA: Insufficient documentation

## 2020-02-27 ENCOUNTER — Other Ambulatory Visit: Payer: Self-pay

## 2020-02-27 ENCOUNTER — Ambulatory Visit
Admission: RE | Admit: 2020-02-27 | Discharge: 2020-02-27 | Disposition: A | Discharge status: Home / Self Care | Payer: Medicare HMO | Source: Ambulatory Visit | Attending: Radiation Oncology | Admitting: Radiation Oncology

## 2020-02-27 DIAGNOSIS — C480 Malignant neoplasm of retroperitoneum: Secondary | ICD-10-CM | POA: Diagnosis not present

## 2020-02-28 ENCOUNTER — Ambulatory Visit
Admission: RE | Admit: 2020-02-28 | Discharge: 2020-02-28 | Disposition: A | Discharge status: Home / Self Care | Payer: Medicare HMO | Source: Ambulatory Visit | Attending: Radiation Oncology | Admitting: Radiation Oncology

## 2020-02-28 DIAGNOSIS — C480 Malignant neoplasm of retroperitoneum: Secondary | ICD-10-CM | POA: Diagnosis not present

## 2020-02-29 ENCOUNTER — Ambulatory Visit
Admission: RE | Admit: 2020-02-29 | Discharge: 2020-02-29 | Disposition: A | Discharge status: Home / Self Care | Payer: Medicare HMO | Source: Ambulatory Visit | Attending: Radiation Oncology | Admitting: Radiation Oncology

## 2020-02-29 ENCOUNTER — Ambulatory Visit: Payer: Medicare HMO

## 2020-02-29 DIAGNOSIS — C480 Malignant neoplasm of retroperitoneum: Secondary | ICD-10-CM | POA: Diagnosis not present

## 2020-03-01 ENCOUNTER — Ambulatory Visit: Payer: Medicare HMO

## 2020-03-01 ENCOUNTER — Ambulatory Visit
Admission: RE | Admit: 2020-03-01 | Discharge: 2020-03-01 | Disposition: A | Discharge status: Home / Self Care | Payer: Medicare HMO | Source: Ambulatory Visit | Attending: Radiation Oncology | Admitting: Radiation Oncology

## 2020-03-01 DIAGNOSIS — C480 Malignant neoplasm of retroperitoneum: Secondary | ICD-10-CM | POA: Diagnosis not present

## 2020-03-01 NOTE — Progress Notes (Signed)
Pt here for patient teaching.  Pt given Radiation and You booklet, skin care instructions and Sonafine.  Reviewed areas of pertinence such as diarrhea, fatigue, hair loss, nausea and vomiting, skin changes and urinary and bladder changes . Pt able to give teach back of to pat skin, use unscented/gentle soap, use baby wipes, have Imodium on hand, drink plenty of water and sitz bath,apply Sonafine bid and avoid applying anything to skin within 4 hours of treatment. Pt verbalizes understanding of information given and will contact nursing with any questions or concerns.     Gloriajean Dell. Leonie Green, BSN

## 2020-03-02 ENCOUNTER — Ambulatory Visit: Payer: Medicare HMO

## 2020-03-02 ENCOUNTER — Ambulatory Visit
Admission: RE | Admit: 2020-03-02 | Discharge: 2020-03-02 | Disposition: A | Discharge status: Home / Self Care | Payer: Medicare HMO | Source: Ambulatory Visit | Attending: Radiation Oncology | Admitting: Radiation Oncology

## 2020-03-02 DIAGNOSIS — C480 Malignant neoplasm of retroperitoneum: Secondary | ICD-10-CM | POA: Diagnosis not present

## 2020-03-02 MED ORDER — SONAFINE EX EMUL
1.0000 "application " | Freq: Once | CUTANEOUS | Status: AC
  Administered 2020-03-02: 1 via TOPICAL

## 2020-03-05 ENCOUNTER — Ambulatory Visit
Admission: RE | Admit: 2020-03-05 | Discharge: 2020-03-05 | Disposition: A | Discharge status: Home / Self Care | Payer: Medicare HMO | Source: Ambulatory Visit | Attending: Radiation Oncology | Admitting: Radiation Oncology

## 2020-03-05 DIAGNOSIS — C480 Malignant neoplasm of retroperitoneum: Secondary | ICD-10-CM | POA: Diagnosis not present

## 2020-03-06 ENCOUNTER — Ambulatory Visit
Admission: RE | Admit: 2020-03-06 | Discharge: 2020-03-06 | Disposition: A | Discharge status: Home / Self Care | Payer: Medicare HMO | Source: Ambulatory Visit | Attending: Radiation Oncology | Admitting: Radiation Oncology

## 2020-03-06 DIAGNOSIS — C480 Malignant neoplasm of retroperitoneum: Secondary | ICD-10-CM | POA: Diagnosis not present

## 2020-03-07 ENCOUNTER — Ambulatory Visit
Admission: RE | Admit: 2020-03-07 | Discharge: 2020-03-07 | Disposition: A | Discharge status: Home / Self Care | Payer: Medicare HMO | Source: Ambulatory Visit | Attending: Radiation Oncology | Admitting: Radiation Oncology

## 2020-03-07 DIAGNOSIS — C480 Malignant neoplasm of retroperitoneum: Secondary | ICD-10-CM | POA: Diagnosis not present

## 2020-03-08 ENCOUNTER — Ambulatory Visit
Admission: RE | Admit: 2020-03-08 | Discharge: 2020-03-08 | Disposition: A | Discharge status: Home / Self Care | Payer: Medicare HMO | Source: Ambulatory Visit | Attending: Radiation Oncology | Admitting: Radiation Oncology

## 2020-03-08 DIAGNOSIS — C480 Malignant neoplasm of retroperitoneum: Secondary | ICD-10-CM | POA: Diagnosis not present

## 2020-03-09 ENCOUNTER — Ambulatory Visit
Admission: RE | Admit: 2020-03-09 | Discharge: 2020-03-09 | Disposition: A | Discharge status: Home / Self Care | Payer: Medicare HMO | Source: Ambulatory Visit | Attending: Radiation Oncology | Admitting: Radiation Oncology

## 2020-03-09 DIAGNOSIS — C480 Malignant neoplasm of retroperitoneum: Secondary | ICD-10-CM | POA: Diagnosis not present

## 2020-03-12 ENCOUNTER — Ambulatory Visit
Admission: RE | Admit: 2020-03-12 | Discharge: 2020-03-12 | Disposition: A | Discharge status: Home / Self Care | Payer: Medicare HMO | Source: Ambulatory Visit | Attending: Radiation Oncology | Admitting: Radiation Oncology

## 2020-03-12 ENCOUNTER — Other Ambulatory Visit: Payer: Self-pay

## 2020-03-12 DIAGNOSIS — C480 Malignant neoplasm of retroperitoneum: Secondary | ICD-10-CM | POA: Diagnosis not present

## 2020-03-13 ENCOUNTER — Ambulatory Visit
Admission: RE | Admit: 2020-03-13 | Discharge: 2020-03-13 | Disposition: A | Discharge status: Home / Self Care | Payer: Medicare HMO | Source: Ambulatory Visit | Attending: Radiation Oncology | Admitting: Radiation Oncology

## 2020-03-13 DIAGNOSIS — C480 Malignant neoplasm of retroperitoneum: Secondary | ICD-10-CM | POA: Diagnosis not present

## 2020-03-14 ENCOUNTER — Ambulatory Visit
Admission: RE | Admit: 2020-03-14 | Discharge: 2020-03-14 | Disposition: A | Discharge status: Home / Self Care | Payer: Medicare HMO | Source: Ambulatory Visit | Attending: Radiation Oncology | Admitting: Radiation Oncology

## 2020-03-14 DIAGNOSIS — C480 Malignant neoplasm of retroperitoneum: Secondary | ICD-10-CM | POA: Diagnosis not present

## 2020-03-14 MED ORDER — SONAFINE EX EMUL
1.0000 "application " | Freq: Two times a day (BID) | CUTANEOUS | Status: DC
  Administered 2020-03-14: 1 via TOPICAL

## 2020-03-19 ENCOUNTER — Ambulatory Visit
Admission: RE | Admit: 2020-03-19 | Discharge: 2020-03-19 | Disposition: A | Discharge status: Home / Self Care | Payer: Medicare HMO | Source: Ambulatory Visit | Attending: Radiation Oncology | Admitting: Radiation Oncology

## 2020-03-19 DIAGNOSIS — C480 Malignant neoplasm of retroperitoneum: Secondary | ICD-10-CM | POA: Diagnosis not present

## 2020-03-20 ENCOUNTER — Ambulatory Visit
Admission: RE | Admit: 2020-03-20 | Discharge: 2020-03-20 | Disposition: A | Discharge status: Home / Self Care | Payer: Medicare HMO | Source: Ambulatory Visit | Attending: Radiation Oncology | Admitting: Radiation Oncology

## 2020-03-20 DIAGNOSIS — C480 Malignant neoplasm of retroperitoneum: Secondary | ICD-10-CM | POA: Diagnosis not present

## 2020-03-21 ENCOUNTER — Ambulatory Visit
Admission: RE | Admit: 2020-03-21 | Discharge: 2020-03-21 | Disposition: A | Discharge status: Home / Self Care | Payer: Medicare HMO | Source: Ambulatory Visit | Attending: Radiation Oncology | Admitting: Radiation Oncology

## 2020-03-21 DIAGNOSIS — C480 Malignant neoplasm of retroperitoneum: Secondary | ICD-10-CM | POA: Insufficient documentation

## 2020-03-22 ENCOUNTER — Ambulatory Visit
Admission: RE | Admit: 2020-03-22 | Discharge: 2020-03-22 | Disposition: A | Discharge status: Home / Self Care | Payer: Medicare HMO | Source: Ambulatory Visit | Attending: Radiation Oncology | Admitting: Radiation Oncology

## 2020-03-22 DIAGNOSIS — C480 Malignant neoplasm of retroperitoneum: Secondary | ICD-10-CM | POA: Diagnosis not present

## 2020-03-23 ENCOUNTER — Ambulatory Visit
Admission: RE | Admit: 2020-03-23 | Discharge: 2020-03-23 | Disposition: A | Discharge status: Home / Self Care | Payer: Medicare HMO | Source: Ambulatory Visit | Attending: Radiation Oncology | Admitting: Radiation Oncology

## 2020-03-23 DIAGNOSIS — C480 Malignant neoplasm of retroperitoneum: Secondary | ICD-10-CM | POA: Diagnosis not present

## 2020-03-25 ENCOUNTER — Other Ambulatory Visit: Payer: Self-pay | Admitting: Cardiology

## 2020-03-26 ENCOUNTER — Ambulatory Visit
Admission: RE | Admit: 2020-03-26 | Discharge: 2020-03-26 | Disposition: A | Discharge status: Home / Self Care | Payer: Medicare HMO | Source: Ambulatory Visit | Attending: Radiation Oncology | Admitting: Radiation Oncology

## 2020-03-26 ENCOUNTER — Other Ambulatory Visit: Payer: Self-pay | Admitting: *Deleted

## 2020-03-26 DIAGNOSIS — C480 Malignant neoplasm of retroperitoneum: Secondary | ICD-10-CM | POA: Diagnosis not present

## 2020-03-26 NOTE — Telephone Encounter (Signed)
Error

## 2020-03-27 ENCOUNTER — Ambulatory Visit
Admission: RE | Admit: 2020-03-27 | Discharge: 2020-03-27 | Disposition: A | Discharge status: Home / Self Care | Payer: Medicare HMO | Source: Ambulatory Visit | Attending: Radiation Oncology | Admitting: Radiation Oncology

## 2020-03-27 DIAGNOSIS — C480 Malignant neoplasm of retroperitoneum: Secondary | ICD-10-CM | POA: Diagnosis not present

## 2020-03-28 ENCOUNTER — Other Ambulatory Visit: Payer: Self-pay

## 2020-03-28 ENCOUNTER — Ambulatory Visit
Admission: RE | Admit: 2020-03-28 | Discharge: 2020-03-28 | Disposition: A | Discharge status: Home / Self Care | Payer: Medicare HMO | Source: Ambulatory Visit | Attending: Radiation Oncology | Admitting: Radiation Oncology

## 2020-03-28 DIAGNOSIS — C480 Malignant neoplasm of retroperitoneum: Secondary | ICD-10-CM | POA: Diagnosis not present

## 2020-03-29 ENCOUNTER — Ambulatory Visit
Admission: RE | Admit: 2020-03-29 | Discharge: 2020-03-29 | Disposition: A | Discharge status: Home / Self Care | Payer: Medicare HMO | Source: Ambulatory Visit | Attending: Radiation Oncology | Admitting: Radiation Oncology

## 2020-03-29 DIAGNOSIS — C480 Malignant neoplasm of retroperitoneum: Secondary | ICD-10-CM | POA: Diagnosis not present

## 2020-03-30 ENCOUNTER — Ambulatory Visit
Admission: RE | Admit: 2020-03-30 | Discharge: 2020-03-30 | Disposition: A | Discharge status: Home / Self Care | Payer: Medicare HMO | Source: Ambulatory Visit | Attending: Radiation Oncology | Admitting: Radiation Oncology

## 2020-03-30 DIAGNOSIS — C480 Malignant neoplasm of retroperitoneum: Secondary | ICD-10-CM | POA: Diagnosis not present

## 2020-04-02 ENCOUNTER — Ambulatory Visit
Admission: RE | Admit: 2020-04-02 | Discharge: 2020-04-02 | Disposition: A | Discharge status: Home / Self Care | Payer: Medicare HMO | Source: Ambulatory Visit | Attending: Radiation Oncology | Admitting: Radiation Oncology

## 2020-04-02 DIAGNOSIS — C480 Malignant neoplasm of retroperitoneum: Secondary | ICD-10-CM | POA: Diagnosis not present

## 2020-04-03 ENCOUNTER — Ambulatory Visit
Admission: RE | Admit: 2020-04-03 | Discharge: 2020-04-03 | Disposition: A | Discharge status: Home / Self Care | Payer: Medicare HMO | Source: Ambulatory Visit | Attending: Radiation Oncology | Admitting: Radiation Oncology

## 2020-04-03 DIAGNOSIS — C480 Malignant neoplasm of retroperitoneum: Secondary | ICD-10-CM | POA: Diagnosis not present

## 2020-04-04 ENCOUNTER — Ambulatory Visit
Admission: RE | Admit: 2020-04-04 | Discharge: 2020-04-04 | Disposition: A | Discharge status: Home / Self Care | Payer: Medicare HMO | Source: Ambulatory Visit | Attending: Radiation Oncology | Admitting: Radiation Oncology

## 2020-04-04 DIAGNOSIS — C480 Malignant neoplasm of retroperitoneum: Secondary | ICD-10-CM | POA: Diagnosis not present

## 2020-04-05 ENCOUNTER — Other Ambulatory Visit: Payer: Self-pay

## 2020-04-05 ENCOUNTER — Ambulatory Visit
Admission: RE | Admit: 2020-04-05 | Discharge: 2020-04-05 | Disposition: A | Discharge status: Home / Self Care | Payer: Medicare HMO | Source: Ambulatory Visit | Attending: Radiation Oncology | Admitting: Radiation Oncology

## 2020-04-05 DIAGNOSIS — C480 Malignant neoplasm of retroperitoneum: Secondary | ICD-10-CM | POA: Diagnosis not present

## 2020-04-06 ENCOUNTER — Encounter: Payer: Self-pay | Admitting: Radiation Oncology

## 2020-04-06 ENCOUNTER — Other Ambulatory Visit: Payer: Self-pay

## 2020-04-06 ENCOUNTER — Ambulatory Visit
Admission: RE | Admit: 2020-04-06 | Discharge: 2020-04-06 | Disposition: A | Discharge status: Home / Self Care | Payer: Medicare HMO | Source: Ambulatory Visit | Attending: Radiation Oncology | Admitting: Radiation Oncology

## 2020-04-06 DIAGNOSIS — C480 Malignant neoplasm of retroperitoneum: Secondary | ICD-10-CM

## 2020-04-06 MED ORDER — SONAFINE EX EMUL
1.0000 "application " | Freq: Once | CUTANEOUS | Status: AC
  Administered 2020-04-06: 1 via TOPICAL

## 2020-04-09 ENCOUNTER — Ambulatory Visit: Payer: Medicare HMO

## 2020-04-10 ENCOUNTER — Ambulatory Visit: Payer: Medicare HMO

## 2020-04-24 ENCOUNTER — Telehealth: Payer: Self-pay | Admitting: Cardiology

## 2020-04-24 NOTE — Telephone Encounter (Signed)
Pt c/o of Chest Pain: STAT if CP now or developed within 24 hours  1. Are you having CP right now? Not today  2. Are you experiencing any other symptoms (ex. SOB, nausea, vomiting, sweating)? Became dizzy, nauseous and sweating. Had to take his nausea medicine so he did not get sick yesterday.   3. How long have you been experiencing CP? Yesterday when he was outside doing work outside due to the snow.   4. Is your CP continuous or coming and going? Persistent yesterday.   5. Have you taken Nitroglycerin? No   Spoke with Rexanne Mano, phone is only allowing me to direct transfer. She advised to send a note and will call right back. Sending as high priority.    ?

## 2020-04-24 NOTE — Telephone Encounter (Signed)
Spoke with pt on the phone. 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  Pt states that the pain went away when he laid down to rest but came back when he got back up.  Pt states that he felt light headed and nauseous at times. Pt took 2 doses on zofran to help with the nausea.  Pt describes that he had a hot flushed feeling that started at his chest and moved up to his head.  Pt took his blood pressure a few times during the event. Pt reports blood pressure around 7pm last night 96/84, HR- 148bpm.  Pt states his normal HR is around 45-50bpm.  Pt took B/P again around 9pm 119/97, HR- 137bpm, a few mins later around 9:10pm 122/80- HR 135bpm, and this morning when he took his b/p it was 140/97- HR 50bpm. Pt states that he feels pretty normal today but wife was really concerned about episode that took place yesterday.  Pt does not have SL nitroglycerin.  Made pt an appointment with Dr. Gardiner Rhyme for 1/5 at 3:40pm.  Advised pt that if he were to feel anything like the episode that he had yesterday that he should go to the ED.  Pt verbalizes understanding.

## 2020-04-25 ENCOUNTER — Ambulatory Visit: Payer: Medicare HMO | Admitting: Cardiology

## 2020-04-25 ENCOUNTER — Encounter: Payer: Self-pay | Admitting: *Deleted

## 2020-04-25 ENCOUNTER — Ambulatory Visit (INDEPENDENT_AMBULATORY_CARE_PROVIDER_SITE_OTHER): Payer: Medicare HMO

## 2020-04-25 ENCOUNTER — Other Ambulatory Visit: Payer: Self-pay

## 2020-04-25 ENCOUNTER — Encounter: Payer: Self-pay | Admitting: Cardiology

## 2020-04-25 VITALS — BP 130/72 | HR 44 | Ht 71.0 in | Wt 159.2 lb

## 2020-04-25 DIAGNOSIS — R001 Bradycardia, unspecified: Secondary | ICD-10-CM

## 2020-04-25 DIAGNOSIS — I10 Essential (primary) hypertension: Secondary | ICD-10-CM | POA: Diagnosis not present

## 2020-04-25 DIAGNOSIS — R079 Chest pain, unspecified: Secondary | ICD-10-CM

## 2020-04-25 DIAGNOSIS — I517 Cardiomegaly: Secondary | ICD-10-CM

## 2020-04-25 DIAGNOSIS — I471 Supraventricular tachycardia: Secondary | ICD-10-CM

## 2020-04-25 DIAGNOSIS — I77819 Aortic ectasia, unspecified site: Secondary | ICD-10-CM | POA: Diagnosis not present

## 2020-04-25 MED ORDER — LISINOPRIL 10 MG PO TABS
10.0000 mg | ORAL_TABLET | Freq: Every day | ORAL | 3 refills | Status: DC
Start: 2020-04-25 — End: 2020-08-23

## 2020-04-25 NOTE — Progress Notes (Signed)
Patient ID: William Avila, male   DOB: [birth year], 75 y.o.   MRN: [id number] Patient enrolled for [employer] to mail a 7 day ZIO XT long term holter monitor to his PO BOX.

## 2020-04-25 NOTE — Patient Instructions (Signed)
Medication Instructions:  DECREASE lisinopril to 10 mg daily  Please check your blood pressure at home daily, write it down.  Call the office or send message via Mychart with the readings in 2 weeks for Dr. Gardiner Rhyme to review.   *If you need a refill on your cardiac medications before your next appointment, please call your pharmacy*  Testing/Procedures: Coronary CTA-see instructions below   ZIO XT- Long Term Monitor Instructions   Your physician has requested you wear your ZIO patch monitor 7 days.   This is a single patch monitor.  [employer] supplies one patch monitor per enrollment.  Additional stickers are not available.   Please do not apply patch if you will be having a Nuclear Stress Test, Echocardiogram, Cardiac CT, MRI, or Chest Xray during the time frame you would be wearing the monitor. The patch cannot be worn during these tests.  You cannot remove and re-apply the ZIO XT patch monitor.   Your ZIO patch monitor will be sent USPS Priority mail from [facility] directly to your home address. The monitor may also be mailed to a PO BOX if home delivery is not available.   It may take 3-5 days to receive your monitor after you have been enrolled.   Once you have received you monitor, please review enclosed instructions.  Your monitor has already been registered assigning a specific monitor serial # to you.   Applying the monitor   Shave hair from upper left chest.   Hold abrader disc by orange tab.  Rub abrader in 40 strokes over left upper chest as indicated in your monitor instructions.   Clean area with 4 enclosed alcohol pads .  Use all pads to assure are is cleaned thoroughly.  Let dry.   Apply patch as indicated in monitor instructions.  Patch will be place under collarbone on left side of chest with arrow pointing upward.   Rub patch adhesive wings for 2 minutes.Remove white label marked "1".  Remove white label marked "2".  Rub patch adhesive wings for 2  additional minutes.   While looking in a mirror, press and release button in center of patch.  A small green light will flash 3-4 times .  This will be your only indicator the monitor has been turned on.     Do not shower for the first 24 hours.  You may shower after the first 24 hours.   Press button if you feel a symptom. You will hear a small click.  Record Date, Time and Symptom in the Patient Log Book.   When you are ready to remove patch, follow instructions on last 2 pages of Patient Log Book.  Stick patch monitor onto last page of Patient Log Book.   Place Patient Log Book in [place] box.  Use locking tab on box and tape box closed securely.  The [employer] has [employer] on it.  Please place in mailbox as soon as possible.  Your physician should have your test results approximately 7 days after the monitor has been mailed back to [facility].   Call [place] at [phone] if you have questions regarding your ZIO XT patch monitor.  Call them immediately if you see an orange light blinking on your monitor.   If your monitor falls off in less than 4 days contact our Monitor department at [phone].  If your monitor becomes loose or falls off after 4 days call [employer] at [phone] for suggestions on  securing your monitor.   Follow-Up: At [facility], you and your health needs are our priority.  As part of our continuing mission to provide you with exceptional heart care, we have created designated Provider Care Teams.  These Care Teams include your primary Cardiologist (physician) and Advanced Practice Providers (APPs -  Physician Assistants and Nurse Practitioners) who all work together to provide you with the care you need, when you need it.  We recommend signing up for the patient portal called "MyChart".  Sign up information is provided on this After Visit Summary.  MyChart is used to connect with patients for Virtual Visits (Telemedicine).   Patients are able to view lab/test results, encounter notes, upcoming appointments, etc.  Non-urgent messages can be sent to your provider as well.   To learn more about what you can do with MyChart, go to [email or web address].    Your next appointment:   3 month(s)  The format for your next appointment:   In Person  Provider:   Oswaldo Milian, MD   Other Instructions Your cardiac CT will be scheduled at one of the below locations:   [facility] [address] [place], [place] [address] [phone]  [facility] [address] [place], [place] [address] [phone]  If scheduled at [facility], please arrive at the [facility] main entrance of [facility] 30 minutes prior to test start time. Proceed to the [facility] Radiology Department (first floor) to check-in and test prep.  If scheduled at [facility], please arrive 15 mins early for check-in and test prep.  Please follow these instructions carefully (unless otherwise directed):  Hold all erectile dysfunction medications at least 3 days (72 hrs) prior to test.  On the Night Before the Test: . Be sure to Drink plenty of water. . Do not consume any caffeinated/decaffeinated beverages or chocolate 12 hours prior to your test. . Do not take any antihistamines 12 hours prior to your test.  On the Day of the Test: . Drink plenty of water. Do not drink any water within one hour of the test. . Do not eat any food 4 hours prior to the test. . You may take your regular medications prior to the test.       After the Test: . Drink plenty of water. . After receiving IV contrast, you may experience a mild flushed feeling. This is normal. . On occasion, you may experience a mild rash up to 24 hours after the test. This is not dangerous. If this occurs, you can take Benadryl 25 mg and increase your fluid  intake. . If you experience trouble breathing, this can be serious. If it is severe call 911 IMMEDIATELY. If it is mild, please call our office. . If you take any of these medications: Glipizide/Metformin, Avandament, Glucavance, please do not take 48 hours after completing test unless otherwise instructed.   Once we have confirmed authorization from your insurance company, we will call you to set up a date and time for your test. Based on how quickly your insurance processes prior authorizations requests, please allow up to 4 weeks to be contacted for scheduling your Cardiac CT appointment. Be advised that routine Cardiac CT appointments could be scheduled as many as 8 weeks after your provider has ordered it.  For non-scheduling related questions, please contact the cardiac imaging nurse navigator should you have any questions/concerns: Marchia Bond, Cardiac Imaging Nurse  Navigator Burley Saver, Interim Cardiac Imaging Nurse Navigator [facility] Direct Office Dial: [phone]   For scheduling needs, including cancellations and rescheduling, please call [place], [phone].

## 2020-04-25 NOTE — Progress Notes (Signed)
Cardiology Office Note:    Date:  04/25/2020   ID:  William Avila, DOB 1945-07-27, MRN [id number]  PCP:  [name], MD  Cardiologist:  No primary care provider on file.  Electrophysiologist:  None   Referring MD: [name], MD   Chief Complaint  Patient presents with  . Chest Pain    History of Present Illness:    William Avila is a 75 y.o. male with a hx of retroperitoneal liposarcoma who presents for follow-up. He was seen in the ED on 05/30/2019 after having a surgical procedure on his arm and having runs of tachycardia following this.Telemetry in the ED showed frequent episodes of narrow complex tachycardia, with rates up to 150s. Appeared to be ectopic P wave during episodes suggesting atrial tachycardia as the cause. He was discharged on metoprolol but did not take.  TTE on 07/06/2019 showed EF 65 to 70%, moderate LVH, grade 1 diastolic dysfunction, normal RV function, moderate dilatation of the ascending aorta measuring 44 mm.  CMR with MRA was recommended to work-up LVH and aortic dilatation.    CMR on 09/14/2019 showed mild asymmetric hypertrophy measuring 13 mm and basal septum not meeting criteria for HCM, no LGE, LV EF 61%, RVEF 63%, dilated ascending aorta measuring 41 mm.  Zio patch x14 days showed 485 episodes of SVT, longest lasting nearly 5 minutes, occasional PVCs (2% of beats).  Reported exertional chest pain, Lexiscan Myoview was done on 11/16/2019 which showed no ischemia, attenuation artifact, LVEF 56%.  Since last clinic visit,  he reports that he has been doing okay.  He had retroperitoneal mass removed and hernia repair in September.  Mass found to be liposarcoma.  Underwent radiation as well.  Has lost about 15 pounds.  Reports on Monday was brushing snow off car when he had episode of chest pain.  Described as pressure in center of chest.  Lasted about 20 to 30 minutes.  Also with lightheadedness but denies any syncope.  Reports has been having SBP  down to 90s and heart rate up to 140s  Wt Readings from Last 3 Encounters:  04/25/20 159 lb 3.2 oz (72.2 kg)  02/09/20 157 lb (71.2 kg)  01/19/20 174 lb 6.1 oz (79.1 kg)     Past Medical History:  Diagnosis Date  . Acute appendicitis 01/21/2014  . Arthritis   . Dysrhythmia    PSVT, bradycardia  . History of hiatal hernia   . Hypertension     Past Surgical History:  Procedure Laterality Date  . BACK SURGERY     and neck and trigger finger surgery  . CERVICAL SPINE SURGERY    . ESOPHAGOGASTRODUODENOSCOPY (EGD) WITH PROPOFOL Left 12/24/2015   Procedure: ESOPHAGOGASTRODUODENOSCOPY (EGD) WITH PROPOFOL;  Surgeon: Otis Brace, MD;  Location: [facility];  Service: Gastroenterology;  Laterality: Left;  . HIATAL HERNIA REPAIR N/A 12/26/2015   Procedure: LAPAROSCOPIC REPAIR OF HIATAL HERNIA WITH MESH;  Surgeon: Michael Boston, MD;  Location: [facility];  Service: General;  Laterality: N/A;  . INGUINAL HERNIA REPAIR Left 01/19/2020   Procedure: LEFT INGUINAL HERNIA REPAIR WITH MESH;  Surgeon: Stark Klein, MD;  Location: [facility];  Service: General;  Laterality: Left;  . INSERTION OF MESH Left 01/19/2020   Procedure: INSERTION OF MESH;  Surgeon: Stark Klein, MD;  Location: [facility];  Service: General;  Laterality: Left;  . LAPAROSCOPIC APPENDECTOMY N/A 01/21/2014   Procedure: APPENDECTOMY LAPAROSCOPIC;  Surgeon: Autumn Messing III, MD;  Location: [facility];  Service: General;  Laterality: N/A;  . RESECTION OF RETROPERITONEAL MASS N/A 01/19/2020   Procedure: 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;  Surgeon: Stark Klein, MD;  Location: [facility];  Service: General;  Laterality: N/A;  . TRIGGER FINGER RELEASE Left 05/2019    Current Medications: Current Meds  Medication Sig  . acetaminophen (TYLENOL) 500 MG tablet Take 500 mg by mouth every 6 (six) hours as needed for mild pain.   . hydroxypropyl methylcellulose / hypromellose (ISOPTO TEARS / GONIOVISC) 2.5 % ophthalmic solution Place 1 drop  into both eyes 3 (three) times daily as needed for dry eyes.  . Multiple Vitamins-Minerals (MULTIVITAMIN WITH MINERALS) tablet Take 1 tablet by mouth daily.  . ondansetron (ZOFRAN-ODT) 8 MG disintegrating tablet Take 8 mg by mouth 3 (three) times daily.  Marland Kitchen triamcinolone (KENALOG) 0.1 % 1 application  . [DISCONTINUED] lisinopril (ZESTRIL) 10 MG tablet TAKE 1 TABLET (10 MG TOTAL) BY MOUTH IN THE MORNING AND 0.5 TABLETS (5 MG TOTAL) EVERY EVENING.     Allergies:   Codeine, Ibuprofen, Robaxin [methocarbamol], Chlorhexidine, Fentanyl and related, and Tramadol hcl   Social History   Socioeconomic History  . Marital status: Married    Spouse name: Not on file  . Number of children: Not on file  . Years of education: Not on file  . Highest education level: Not on file  Occupational History  . Not on file  Tobacco Use  . Smoking status: Never Smoker  . Smokeless tobacco: Never Used  Vaping Use  . Vaping Use: Never used  Substance and Sexual Activity  . Alcohol use: Yes    Comment: Occassionally  . Drug use: No  . Sexual activity: Not on file  Other Topics Concern  . Not on file  Social History Narrative  . Not on file   Social Determinants of Health   Financial Resource Strain: Not on file  Food Insecurity: Not on file  Transportation Needs: Not on file  Physical Activity: Not on file  Stress: Not on file  Social Connections: Not on file     Family History: No relevant family history   ROS:   Please see the history of present illness.    All other systems reviewed and are negative.  EKGs/Labs/Other Studies Reviewed:    The following studies were reviewed today:   EKG:  EKG is  ordered today.  The ekg ordered today demonstrates sinus rhythm, rate 44, no ST/T abnormalities  Recent Labs: 05/30/2019: TSH 0.584 01/22/2020: ALT 78; Magnesium 1.8 01/24/2020: BUN 17; Creatinine, Ser 1.34; Hemoglobin 10.3; Platelets 263; Potassium 3.6; Sodium 131  Recent Lipid Panel No  results found for: CHOL, TRIG, HDL, CHOLHDL, VLDL, LDLCALC, LDLDIRECT   Lexiscan Myoview 06/09/18:  Nuclear stress EF: 62%.  No T wave inversion was noted during stress.  There was no ST segment deviation noted during stress.  This is a low risk study.   No significant reversible ischemia. LVEF 62% with normal wall motion. This is a low risk study.  Physical Exam:    VS:  BP 130/72   Pulse (!) 44   Ht 5' 11"  (1.803 m)   Wt 159 lb 3.2 oz (72.2 kg)   SpO2 92%   BMI 22.20 kg/m     Wt Readings from Last 3 Encounters:  04/25/20 159 lb 3.2 oz (72.2 kg)  02/09/20 157 lb (71.2 kg)  01/19/20 174 lb 6.1 oz (79.1 kg)     GEN:  Well nourished, well developed in no acute  distress HEENT: Normal NECK: No JVD CARDIAC: Bradycardic, regular rhythm, 2/6 systolic heart murmur RESPIRATORY:  Clear to auscultation without rales, wheezing or rhonchi  ABDOMEN: Soft, non-tender, non-distended MUSCULOSKELETAL:  No edema; No deformity  SKIN: Warm and dry NEUROLOGIC:  Alert and oriented x 3 PSYCHIATRIC:  Normal affect   ASSESSMENT:    1. Chest pain of uncertain etiology   2. Bradycardia   3. SVT (supraventricular tachycardia) ([facility])   4. Aortic dilatation ([facility])   5. LVH (left ventricular hypertrophy)   6. Essential hypertension    PLAN:    Chest pain: Reported chest pain, Lexiscan Myoview was done on 11/16/2019 which showed no ischemia, attenuation artifact, LVEF 56%.  This week had exertional chest pressure that is concerning for angina.  Will check coronary CTA.  SVT/Bradycardia:Frequent episodes SVT on Zio in March 2021.  Unable to tolerate low dose metoprolol as resting heart rate in 40s to 50s.  Does report some lightheadedness with SVT episodes with heart rate up to 140s.  Referred to EP, seen by Dr. Lovena Le, planning to monitor for now.  He reports recent episodes of lightheadedness, will recheck Zio patch x7 days  LVH: CMR on 09/14/2019 showed mild asymmetric hypertrophy measuring 13  mm and basal septum not meeting criteria for HCM, no LGE  Aortic dilatation: dilated ascending aorta measuring 41 mm on MRA.  Will repeat MRA in 1 year for monitoring  Hypertension: On lisinopril 20 mg daily, reports recent low BPs down to SBP 90s.  Will decrease lisinopril to 10 mg daily  RTC in 3 months  Medication Adjustments/Labs and Tests Ordered: Current medicines are reviewed at length with the patient today.  Concerns regarding medicines are outlined above.  Orders Placed This Encounter  Procedures  . CT CORONARY MORPH W/CTA COR W/SCORE W/CA W/CM &/OR WO/CM  . CT CORONARY FRACTIONAL FLOW RESERVE DATA PREP  . CT CORONARY FRACTIONAL FLOW RESERVE FLUID ANALYSIS  . LONG TERM MONITOR (3-14 DAYS)  . EKG 12-Lead   Meds ordered this encounter  Medications  . lisinopril (ZESTRIL) 10 MG tablet    Sig: Take 1 tablet (10 mg total) by mouth daily.    Dispense:  90 tablet    Refill:  3    Dose change    Patient Instructions  Medication Instructions:  DECREASE lisinopril to 10 mg daily  Please check your blood pressure at home daily, write it down.  Call the office or send message via Mychart with the readings in 2 weeks for Dr. Gardiner Rhyme to review.   *If you need a refill on your cardiac medications before your next appointment, please call your pharmacy*  Testing/Procedures: Coronary CTA-see instructions below   ZIO XT- Long Term Monitor Instructions   Your physician has requested you wear your ZIO patch monitor 7 days.   This is a single patch monitor.  [employer] supplies one patch monitor per enrollment.  Additional stickers are not available.   Please do not apply patch if you will be having a Nuclear Stress Test, Echocardiogram, Cardiac CT, MRI, or Chest Xray during the time frame you would be wearing the monitor. The patch cannot be worn during these tests.  You cannot remove and re-apply the ZIO XT patch monitor.   Your ZIO patch monitor will be sent USPS Priority mail  from [facility] directly to your home address. The monitor may also be mailed to a PO BOX if home delivery is not available.   It may take 3-5 days to  receive your monitor after you have been enrolled.   Once you have received you monitor, please review enclosed instructions.  Your monitor has already been registered assigning a specific monitor serial # to you.   Applying the monitor   Shave hair from upper left chest.   Hold abrader disc by orange tab.  Rub abrader in 40 strokes over left upper chest as indicated in your monitor instructions.   Clean area with 4 enclosed alcohol pads .  Use all pads to assure are is cleaned thoroughly.  Let dry.   Apply patch as indicated in monitor instructions.  Patch will be place under collarbone on left side of chest with arrow pointing upward.   Rub patch adhesive wings for 2 minutes.Remove white label marked "1".  Remove white label marked "2".  Rub patch adhesive wings for 2 additional minutes.   While looking in a mirror, press and release button in center of patch.  A small green light will flash 3-4 times .  This will be your only indicator the monitor has been turned on.     Do not shower for the first 24 hours.  You may shower after the first 24 hours.   Press button if you feel a symptom. You will hear a small click.  Record Date, Time and Symptom in the Patient Log Book.   When you are ready to remove patch, follow instructions on last 2 pages of Patient Log Book.  Stick patch monitor onto last page of Patient Log Book.   Place Patient Log Book in [place] box.  Use locking tab on box and tape box closed securely.  The [employer] has [employer] on it.  Please place in mailbox as soon as possible.  Your physician should have your test results approximately 7 days after the monitor has been mailed back to [facility].   Call [place] at [phone] if you have questions regarding your ZIO XT patch  monitor.  Call them immediately if you see an orange light blinking on your monitor.   If your monitor falls off in less than 4 days contact our Monitor department at [phone].  If your monitor becomes loose or falls off after 4 days call [employer] at [phone] for suggestions on securing your monitor.   Follow-Up: At [facility], you and your health needs are our priority.  As part of our continuing mission to provide you with exceptional heart care, we have created designated Provider Care Teams.  These Care Teams include your primary Cardiologist (physician) and Advanced Practice Providers (APPs -  Physician Assistants and Nurse Practitioners) who all work together to provide you with the care you need, when you need it.  We recommend signing up for the patient portal called "MyChart".  Sign up information is provided on this After Visit Summary.  MyChart is used to connect with patients for Virtual Visits (Telemedicine).  Patients are able to view lab/test results, encounter notes, upcoming appointments, etc.  Non-urgent messages can be sent to your provider as well.   To learn more about what you can do with MyChart, go to [email or web address].    Your next appointment:   3 month(s)  The format for your next appointment:   In Person  Provider:   Oswaldo Milian, MD   Other Instructions Your cardiac CT will be scheduled at one of the below locations:   [facility] [address] [place], [place] [address] [phone]  [facility] [address] [place], [place] [address] [phone]  If scheduled at [facility], please arrive at the [facility] main entrance of [facility] 30 minutes prior to test start time. Proceed to the [facility] Radiology Department (first floor) to check-in and test prep.  If scheduled at [facility], please arrive 15 mins early  for check-in and test prep.  Please follow these instructions carefully (unless otherwise directed):  Hold all erectile dysfunction medications at least 3 days (72 hrs) prior to test.  On the Night Before the Test: . Be sure to Drink plenty of water. . Do not consume any caffeinated/decaffeinated beverages or chocolate 12 hours prior to your test. . Do not take any antihistamines 12 hours prior to your test.  On the Day of the Test: . Drink plenty of water. Do not drink any water within one hour of the test. . Do not eat any food 4 hours prior to the test. . You may take your regular medications prior to the test.       After the Test: . Drink plenty of water. . After receiving IV contrast, you may experience a mild flushed feeling. This is normal. . On occasion, you may experience a mild rash up to 24 hours after the test. This is not dangerous. If this occurs, you can take Benadryl 25 mg and increase your fluid intake. . If you experience trouble breathing, this can be serious. If it is severe call 911 IMMEDIATELY. If it is mild, please call our office. . If you take any of these medications: Glipizide/Metformin, Avandament, Glucavance, please do not take 48 hours after completing test unless otherwise instructed.   Once we have confirmed authorization from your insurance company, we will call you to set up a date and time for your test. Based on how quickly your insurance processes prior authorizations requests, please allow up to 4 weeks to be contacted for scheduling your Cardiac CT appointment. Be advised that routine Cardiac CT appointments could be scheduled as many as 8 weeks after your provider has ordered it.  For non-scheduling related questions, please contact the cardiac imaging nurse navigator should you have any questions/concerns: Marchia Bond, Cardiac Imaging Nurse Navigator Burley Saver, Interim Cardiac Imaging Nurse [place] [facility] Vascular  Services Direct Office Dial: [phone]   For scheduling needs, including cancellations and rescheduling, please call [place], [phone].       Signed, Donato Heinz, MD  04/25/2020 4:22 PM    [place]

## 2020-04-25 NOTE — Progress Notes (Signed)
Patient ID: William Avila, male   DOB: [birth year], 75 y.o.   MRN: [id number] Patient enrolled for [employer] to mail a 7 day ZIO XT long term holter monitor to the patients PO BOX.

## 2020-04-30 DIAGNOSIS — R001 Bradycardia, unspecified: Secondary | ICD-10-CM | POA: Diagnosis not present

## 2020-04-30 DIAGNOSIS — I471 Supraventricular tachycardia: Secondary | ICD-10-CM

## 2020-05-03 ENCOUNTER — Other Ambulatory Visit: Payer: Self-pay

## 2020-05-03 DIAGNOSIS — R079 Chest pain, unspecified: Secondary | ICD-10-CM

## 2020-05-04 LAB — BASIC METABOLIC PANEL
BUN/Creatinine Ratio: 12 (ref 10–24)
BUN: 16 mg/dL (ref 8–27)
CO2: 23 mmol/L (ref 20–29)
Calcium: 9.3 mg/dL (ref 8.6–10.2)
Chloride: 102 mmol/L (ref 96–106)
Creatinine, Ser: 1.31 mg/dL — ABNORMAL HIGH (ref 0.76–1.27)
GFR calc Af Amer: 62 mL/min/{1.73_m2} (ref >59)
GFR calc non Af Amer: 53 mL/min/{1.73_m2} — ABNORMAL LOW (ref >59)
Glucose: 88 mg/dL (ref 65–99)
Potassium: 4.6 mmol/L (ref 3.5–5.2)
Sodium: 140 mmol/L (ref 134–144)

## 2020-05-07 ENCOUNTER — Telehealth: Payer: Self-pay | Admitting: Radiation Oncology

## 2020-05-07 NOTE — Telephone Encounter (Signed)
  Radiation Oncology         [phone] ________________________________  Name: William Avila MRN: [id number]  Date of Service: 05/09/20 DOB: [birth year]  Post Treatment Telephone Note  Diagnosis:  High-grade liposarcoma of the retroperitoneum with positive margins at the time of resection  Interval Since Last Radiation:  5 weeks   02/27/20-04/06/20: The retroperitoneal surgical site was treated to 45 Gy in 25 fractions followed by a 3.6 Gy boost in 3 fractions.  Narrative:  The patient was contacted today for routine follow-up. During treatment he did very well with radiotherapy and did not have significant desquamation.   Impression/Plan: 1. High-grade liposarcoma of the retroperitoneum with positive margins at the time of resection. The patient was unavailable so I left a voicemail and on it I discussed that we would be happy to continue to follow him as needed, but he will also continue to follow up with Dr. Alen Blew in medical oncology and he could call us back if he had any concerns or questions about his treatment.    Carola Rhine, PAC

## 2020-05-08 ENCOUNTER — Telehealth (HOSPITAL_COMMUNITY): Payer: Self-pay | Admitting: *Deleted

## 2020-05-08 NOTE — Telephone Encounter (Signed)
Attempted to call patient regarding upcoming cardiac CT appointment. Left message on voicemail with name and callback number  Sora Olivo RN Navigator Cardiac Imaging [facility] [phone] Office [phone] Cell  

## 2020-05-08 NOTE — Telephone Encounter (Signed)
Pt returning call regarding upcoming cardiac imaging study; pt verbalizes understanding of appt date/time, parking situation and where to check in, pre-test NPO status and medications ordered, and verified current allergies; name and call back number provided for further questions should they arise  Annalynn Centanni RN Navigator Cardiac Imaging [place] [phone] office [phone] cell  

## 2020-05-09 ENCOUNTER — Ambulatory Visit: Payer: Medicare HMO | Admitting: Internal Medicine

## 2020-05-09 NOTE — Telephone Encounter (Signed)
Opened in error

## 2020-05-10 ENCOUNTER — Ambulatory Visit (HOSPITAL_COMMUNITY)
Admission: RE | Admit: 2020-05-10 | Discharge: 2020-05-10 | Disposition: A | Discharge status: Home / Self Care | Payer: Medicare HMO | Source: Ambulatory Visit | Attending: Cardiology | Admitting: Cardiology

## 2020-05-10 ENCOUNTER — Other Ambulatory Visit: Payer: Self-pay

## 2020-05-10 ENCOUNTER — Encounter (HOSPITAL_COMMUNITY): Payer: Self-pay

## 2020-05-10 DIAGNOSIS — R079 Chest pain, unspecified: Secondary | ICD-10-CM

## 2020-05-10 DIAGNOSIS — I77810 Thoracic aortic ectasia: Secondary | ICD-10-CM | POA: Diagnosis not present

## 2020-05-10 DIAGNOSIS — I700 Atherosclerosis of aorta: Secondary | ICD-10-CM | POA: Insufficient documentation

## 2020-05-10 DIAGNOSIS — I517 Cardiomegaly: Secondary | ICD-10-CM | POA: Diagnosis not present

## 2020-05-10 MED ORDER — IOHEXOL 350 MG/ML SOLN
80.0000 mL | Freq: Once | INTRAVENOUS | Status: AC | PRN
  Administered 2020-05-10: 80 mL via INTRAVENOUS

## 2020-05-10 MED ORDER — NITROGLYCERIN 0.4 MG SL SUBL
SUBLINGUAL_TABLET | SUBLINGUAL | Status: AC
  Filled 2020-05-10: qty 2

## 2020-05-10 MED ORDER — NITROGLYCERIN 0.4 MG SL SUBL
0.8000 mg | SUBLINGUAL_TABLET | Freq: Once | SUBLINGUAL | Status: AC | PRN
  Administered 2020-05-10: 0.8 mg via SUBLINGUAL

## 2020-05-16 ENCOUNTER — Telehealth: Payer: Self-pay | Admitting: Cardiology

## 2020-05-16 DIAGNOSIS — R001 Bradycardia, unspecified: Secondary | ICD-10-CM | POA: Diagnosis not present

## 2020-05-16 DIAGNOSIS — I471 Supraventricular tachycardia: Secondary | ICD-10-CM | POA: Diagnosis not present

## 2020-05-16 NOTE — Telephone Encounter (Signed)
Christina from [facility] calling with an abnormal zio report.

## 2020-05-16 NOTE — Telephone Encounter (Signed)
Spoke with Margreta Journey from [facility] regarding patient. Symptomatic bradycardia 05/02/20 at 532 am- heart rate was 39 for 30 seconds.   Spoke with patient, patient states he does not recall any symptoms during that time and was probably asleep. Patient denies any chest pain, dizziness, lightheadedness, shortness of breath or any other symptoms.   Advised patient that I would forward this message to Dr. Gardiner Rhyme to make him aware. Advised patient to call back to office with any issues, questions, or concerns.  Patient verbalized understanding.   Report printed from [facility] and given to Dr. Gardiner Rhyme.

## 2020-05-17 NOTE — Telephone Encounter (Signed)
Left message to call back  appt with Dr. Lovena Le 2/15  Needs follow up with Dr. Gardiner Rhyme next week to discuss CCTA results.

## 2020-05-18 NOTE — Telephone Encounter (Signed)
appt scheduled with Dr. Gardiner Rhyme 1/31

## 2020-05-20 NOTE — Progress Notes (Signed)
  Radiation Oncology         [phone] ________________________________  Name: William Avila MRN: [id number]  Date: 04/06/2020  DOB: 02-23-1946  End of Treatment Note  Diagnosis:   sarcoma     Indication for treatment::  curative       Radiation treatment dates:   02/27/20 - 04/06/20  Site/dose:   The right abdomen was treated initially to a dose of 45 Gy in 25 fractions using a 4-field IMRT plan. A 3.6 Gy boost in 3 fractions was then delivered yielding a total does of 48.6 Gy.  Narrative: The patient tolerated radiation treatment relatively well.     Plan: The patient has completed radiation treatment. The patient will return to radiation oncology clinic for routine followup in one month. I advised the patient to call or return sooner if they have any questions or concerns related to their recovery or treatment. ________________________________  Jodelle Gross, M.D., Ph.D.

## 2020-05-20 NOTE — Progress Notes (Signed)
Cardiology Office Note:    Date:  05/21/2020   ID:  William Avila, DOB [birth year], MRN [id number]  PCP:  [name], MD  Cardiologist:  No primary care provider on file.  Electrophysiologist:  None   Referring MD: [name], MD   Chief Complaint  Patient presents with  . Chest Pain    History of Present Illness:    William Avila is a 75 y.o. male with a hx of retroperitoneal liposarcoma, SVT who presents for follow-up. He was seen in the ED on 05/30/2019 after having a surgical procedure on his arm and having runs of tachycardia following this.Telemetry in the ED showed frequent episodes of narrow complex tachycardia, with rates up to 150s. Appeared to be ectopic P wave during episodes suggesting atrial tachycardia as the cause. He was discharged on metoprolol but did not take.  TTE on 07/06/2019 showed EF 65 to 70%, moderate LVH, grade 1 diastolic dysfunction, normal RV function, moderate dilatation of the ascending aorta measuring 44 mm.  CMR with MRA was recommended to work-up LVH and aortic dilatation.    CMR on 09/14/2019 showed mild asymmetric hypertrophy measuring 13 mm and basal septum not meeting criteria for HCM, no LGE, LV EF 61%, RVEF 63%, dilated ascending aorta measuring 41 mm.  Zio patch x14 days showed 485 episodes of SVT, longest lasting nearly 5 minutes, occasional PVCs (2% of beats).  Reported exertional chest pain, Lexiscan Myoview was done on 11/16/2019 which showed no ischemia, attenuation artifact, LVEF 56%.  Continue to have chest pain underwent coronary CTA on 05/10/2020, which showed calcium score 200 (47 percentile), respiratory motion artifact but no clear obstructive disease.  Zio patch on 05/16/2020 showed 577 episodes of SVT, longest lasting 14 minutes.  Since last clinic visit,  he reports that he is doing okay.  States that chest pain has improved, has not had any of the chest pressure that he had earlier this month.  Has had fluttering feeling  in the chest.  Also had an episode of lightheadedness, checked his BP was 103/81.  Also had home BP measurements up to 150s.  Denies any syncopal episodes.  Has been having intermittent shortness of breath.  He walks 15 to 20 minutes 3-4 times per day.   Wt Readings from Last 3 Encounters:  05/21/20 163 lb (73.9 kg)  04/25/20 159 lb 3.2 oz (72.2 kg)  02/09/20 157 lb (71.2 kg)     Past Medical History:  Diagnosis Date  . Acute appendicitis 01/21/2014  . Arthritis   . Dysrhythmia    PSVT, bradycardia  . History of hiatal hernia   . Hypertension     Past Surgical History:  Procedure Laterality Date  . BACK SURGERY     and neck and trigger finger surgery  . CERVICAL SPINE SURGERY    . ESOPHAGOGASTRODUODENOSCOPY (EGD) WITH PROPOFOL Left 12/24/2015   Procedure: ESOPHAGOGASTRODUODENOSCOPY (EGD) WITH PROPOFOL;  Surgeon: Otis Brace, MD;  Location: [place];  Service: Gastroenterology;  Laterality: Left;  . HIATAL HERNIA REPAIR N/A 12/26/2015   Procedure: LAPAROSCOPIC REPAIR OF HIATAL HERNIA WITH MESH;  Surgeon: Michael Boston, MD;  Location: [place];  Service: General;  Laterality: N/A;  . INGUINAL HERNIA REPAIR Left 01/19/2020   Procedure: LEFT INGUINAL HERNIA REPAIR WITH MESH;  Surgeon: Stark Klein, MD;  Location: [place];  Service: General;  Laterality: Left;  . INSERTION OF MESH Left 01/19/2020   Procedure: INSERTION OF MESH;  Surgeon: Stark Klein, MD;  Location: [place];  Service: General;  Laterality: Left;  . LAPAROSCOPIC APPENDECTOMY N/A 01/21/2014   Procedure: APPENDECTOMY LAPAROSCOPIC;  Surgeon: Autumn Messing III, MD;  Location: [place];  Service: General;  Laterality: N/A;  . RESECTION OF RETROPERITONEAL MASS N/A 01/19/2020   Procedure: 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;  Surgeon: Stark Klein, MD;  Location: [place];  Service: General;  Laterality: N/A;  . TRIGGER FINGER RELEASE Left 05/2019    Current Medications: Current Meds  Medication Sig  .  acetaminophen (TYLENOL) 500 MG tablet Take 500 mg by mouth every 6 (six) hours as needed for mild pain.   . hydroxypropyl methylcellulose / hypromellose (ISOPTO TEARS / GONIOVISC) 2.5 % ophthalmic solution Place 1 drop into both eyes 3 (three) times daily as needed for dry eyes.  Marland Kitchen lisinopril (ZESTRIL) 10 MG tablet Take 1 tablet (10 mg total) by mouth daily.  Marland Kitchen lisinopril (ZESTRIL) 5 MG tablet Take 1 tablet (5 mg total) by mouth daily. In addition to 10 mg tablet=15 mg daily  . Multiple Vitamins-Minerals (MULTIVITAMIN WITH MINERALS) tablet Take 1 tablet by mouth daily.  . ondansetron (ZOFRAN-ODT) 8 MG disintegrating tablet Take 8 mg by mouth 3 (three) times daily.  . rosuvastatin (CRESTOR) 10 MG tablet Take 1 tablet (10 mg total) by mouth daily.  Marland Kitchen triamcinolone (KENALOG) 0.1 % 1 application     Allergies:   Codeine, Ibuprofen, Robaxin [methocarbamol], Chlorhexidine, Contrast media [iodinated diagnostic agents], Fentanyl and related, and Tramadol hcl   Social History   Socioeconomic History  . Marital status: Married    Spouse name: Not on file  . Number of children: Not on file  . Years of education: Not on file  . Highest education level: Not on file  Occupational History  . Not on file  Tobacco Use  . Smoking status: Never Smoker  . Smokeless tobacco: Never Used  Vaping Use  . Vaping Use: Never used  Substance and Sexual Activity  . Alcohol use: Yes    Comment: Occassionally  . Drug use: No  . Sexual activity: Not on file  Other Topics Concern  . Not on file  Social History Narrative  . Not on file   Social Determinants of Health   Financial Resource Strain: Not on file  Food Insecurity: Not on file  Transportation Needs: Not on file  Physical Activity: Not on file  Stress: Not on file  Social Connections: Not on file     Family History: No relevant family history   ROS:   Please see the history of present illness.    All other systems reviewed and are  negative.  EKGs/Labs/Other Studies Reviewed:    The following studies were reviewed today:   EKG:  EKG is  ordered today.  The ekg ordered today demonstrates sinus rhythm, rate 50, no ST/T abnormalities  Recent Labs: 05/30/2019: TSH 0.584 01/22/2020: ALT 78; Magnesium 1.8 01/24/2020: Hemoglobin 10.3; Platelets 263 05/03/2020: BUN 16; Creatinine, Ser 1.31; Potassium 4.6; Sodium 140  Recent Lipid Panel No results found for: CHOL, TRIG, HDL, CHOLHDL, VLDL, LDLCALC, LDLDIRECT   Lexiscan Myoview 06/09/18:  Nuclear stress EF: 62%.  No T wave inversion was noted during stress.  There was no ST segment deviation noted during stress.  This is a low risk study.   No significant reversible ischemia. LVEF 62% with normal wall motion. This is a low risk study.  Physical Exam:    VS:  BP (!) 164/92   Pulse (!) 50   Ht 6' (  1.829 m)   Wt 163 lb (73.9 kg)   BMI 22.11 kg/m     Wt Readings from Last 3 Encounters:  05/21/20 163 lb (73.9 kg)  04/25/20 159 lb 3.2 oz (72.2 kg)  02/09/20 157 lb (71.2 kg)     GEN:  Well nourished, well developed in no acute distress HEENT: Normal NECK: No JVD CARDIAC: Bradycardic, regular rhythm, 2/6 systolic heart murmur RESPIRATORY:  Clear to auscultation without rales, wheezing or rhonchi  ABDOMEN: Soft, non-tender, non-distended MUSCULOSKELETAL:  No edema; No deformity  SKIN: Warm and dry NEUROLOGIC:  Alert and oriented x 3 PSYCHIATRIC:  Normal affect   ASSESSMENT:    1. CAD in native artery   2. Chest pain of uncertain etiology   3. SVT (supraventricular tachycardia) ([place])   4. Essential hypertension   5. LVH (left ventricular hypertrophy)   6. Aortic dilatation ([facility])    PLAN:    Chest pain: Reported chest pain, Lexiscan Myoview was done on 11/16/2019 which showed no ischemia, attenuation artifact, LVEF 56%.  Continue to have chest pain so underwent coronary CTA on 05/10/2020, which showed calcium score 200 (47 percentile), respiratory motion  artifact but no clear obstructive disease, and with recent normal Lexiscan, no further work-up recommended at this time -Start rosuvastatin 10 mg daily.  Will check lipid panel  SVT/Bradycardia:Frequent episodes SVT on Zio in March 2021.  Unable to tolerate low dose metoprolol as resting heart rate in 40s to 50s.  Does report some lightheadedness with SVT episodes with heart rate up to 140s.  Referred to EP, seen by Dr. Lovena Le, planning to monitor for now.  He reports recent episodes of lightheadedness, Zio patch x 7 days on 05/16/2020 showed 577 episodes of SVT, longest lasting 14 minutes. -Recommend follow-up with Dr. Lovena Le for consideration of ablation given persistent frequent SVT, and unable to treat with medications given baseline bradycardia  LVH: CMR on 09/14/2019 showed mild asymmetric hypertrophy measuring 13 mm and basal septum not meeting criteria for HCM, no LGE  Aortic dilatation: dilated ascending aorta measuring 41 mm on MRA.  Will repeat MRA in 1 year for monitoring  Hypertension: On lisinopril 10 mg daily.  Was having soft BPs on lisinopril 20 mg and BP is elevated on lisinopril 10 mg.  Will increase dose to 15 mg.  Check BMP in 1 week  RTC in 3 months  Medication Adjustments/Labs and Tests Ordered: Current medicines are reviewed at length with the patient today.  Concerns regarding medicines are outlined above.  Orders Placed This Encounter  Procedures  . Basic metabolic panel  . Lipid panel  . EKG 12-Lead   Meds ordered this encounter  Medications  . rosuvastatin (CRESTOR) 10 MG tablet    Sig: Take 1 tablet (10 mg total) by mouth daily.    Dispense:  90 tablet    Refill:  3  . lisinopril (ZESTRIL) 5 MG tablet    Sig: Take 1 tablet (5 mg total) by mouth daily. In addition to 10 mg tablet=15 mg daily    Dispense:  90 tablet    Refill:  3    + 10 mg tablet=15 mg total daily.    Patient Instructions  Medication Instructions:  INCREASE Lisinopril to 15 mg daily  (10mg  tablet + 5 mg tablet)  START rosuvastatin (Crestor) 10 mg daily  *If you need a refill on your cardiac medications before your next appointment, please call your pharmacy*   Lab Work: Please return for FASTING labs in  1 week (BMET, Lipid)  Our in office lab hours are Monday-Friday 8:00-4:00, closed for lunch 12:45-1:45 pm.  No appointment needed.   Follow-Up: At [facility], you and your health needs are our priority.  As part of our continuing mission to provide you with exceptional heart care, we have created designated Provider Care Teams.  These Care Teams include your primary Cardiologist (physician) and Advanced Practice Providers (APPs -  Physician Assistants and Nurse Practitioners) who all work together to provide you with the care you need, when you need it.  We recommend signing up for the patient portal called "MyChart".  Sign up information is provided on this After Visit Summary.  MyChart is used to connect with patients for Virtual Visits (Telemedicine).  Patients are able to view lab/test results, encounter notes, upcoming appointments, etc.  Non-urgent messages can be sent to your provider as well.   To learn more about what you can do with MyChart, go to [email or web address].    Your next appointment:   3 month(s)  The format for your next appointment:   In Person  Provider:   Oswaldo Milian, MD       Signed, Donato Heinz, MD  05/21/2020 6:17 PM    [place]

## 2020-05-21 ENCOUNTER — Encounter: Payer: Self-pay | Admitting: Cardiology

## 2020-05-21 ENCOUNTER — Other Ambulatory Visit: Payer: Self-pay

## 2020-05-21 ENCOUNTER — Ambulatory Visit: Payer: Medicare HMO | Admitting: Cardiology

## 2020-05-21 VITALS — BP 164/92 | HR 50 | Ht 72.0 in | Wt 163.0 lb

## 2020-05-21 DIAGNOSIS — R079 Chest pain, unspecified: Secondary | ICD-10-CM

## 2020-05-21 DIAGNOSIS — I471 Supraventricular tachycardia: Secondary | ICD-10-CM | POA: Diagnosis not present

## 2020-05-21 DIAGNOSIS — I77819 Aortic ectasia, unspecified site: Secondary | ICD-10-CM | POA: Diagnosis not present

## 2020-05-21 DIAGNOSIS — I517 Cardiomegaly: Secondary | ICD-10-CM | POA: Diagnosis not present

## 2020-05-21 DIAGNOSIS — I10 Essential (primary) hypertension: Secondary | ICD-10-CM | POA: Diagnosis not present

## 2020-05-21 DIAGNOSIS — I2510 Atherosclerotic heart disease of native coronary artery without angina pectoris: Secondary | ICD-10-CM

## 2020-05-21 MED ORDER — LISINOPRIL 5 MG PO TABS: 5.0000 mg | ORAL_TABLET | Freq: Every day | ORAL | 3 refills | Status: DC

## 2020-05-21 MED ORDER — ROSUVASTATIN CALCIUM 10 MG PO TABS: 10.0000 mg | ORAL_TABLET | Freq: Every day | ORAL | 3 refills | Status: DC

## 2020-05-21 NOTE — Patient Instructions (Signed)
Medication Instructions:  INCREASE Lisinopril to 15 mg daily (10mg  tablet + 5 mg tablet)  START rosuvastatin (Crestor) 10 mg daily  *If you need a refill on your cardiac medications before your next appointment, please call your pharmacy*   Lab Work: Please return for FASTING labs in 1 week (BMET, Lipid)  Our in office lab hours are Monday-Friday 8:00-4:00, closed for lunch 12:45-1:45 pm.  No appointment needed.   Follow-Up: At [facility], you and your health needs are our priority.  As part of our continuing mission to provide you with exceptional heart care, we have created designated Provider Care Teams.  These Care Teams include your primary Cardiologist (physician) and Advanced Practice Providers (APPs -  Physician Assistants and Nurse Practitioners) who all work together to provide you with the care you need, when you need it.  We recommend signing up for the patient portal called "MyChart".  Sign up information is provided on this After Visit Summary.  MyChart is used to connect with patients for Virtual Visits (Telemedicine).  Patients are able to view lab/test results, encounter notes, upcoming appointments, etc.  Non-urgent messages can be sent to your provider as well.   To learn more about what you can do with MyChart, go to [email or web address].    Your next appointment:   3 month(s)  The format for your next appointment:   In Person  Provider:   Oswaldo Milian, MD

## 2020-05-29 ENCOUNTER — Other Ambulatory Visit (HOSPITAL_COMMUNITY)
Admission: RE | Admit: 2020-05-29 | Discharge: 2020-05-29 | Disposition: A | Discharge status: Home / Self Care | Payer: Medicare HMO | Source: Ambulatory Visit | Attending: Cardiology | Admitting: Cardiology

## 2020-05-29 ENCOUNTER — Other Ambulatory Visit: Payer: Self-pay

## 2020-05-29 DIAGNOSIS — I2510 Atherosclerotic heart disease of native coronary artery without angina pectoris: Secondary | ICD-10-CM | POA: Insufficient documentation

## 2020-05-29 DIAGNOSIS — I10 Essential (primary) hypertension: Secondary | ICD-10-CM | POA: Insufficient documentation

## 2020-05-29 LAB — BASIC METABOLIC PANEL
Anion gap: 6 (ref 5–15)
BUN: 19 mg/dL (ref 8–23)
CO2: 27 mmol/L (ref 22–32)
Calcium: 9.3 mg/dL (ref 8.9–10.3)
Chloride: 102 mmol/L (ref 98–111)
Creatinine, Ser: 1.26 mg/dL — ABNORMAL HIGH (ref 0.61–1.24)
GFR, Estimated: 60 mL/min — ABNORMAL LOW (ref >60)
Glucose, Bld: 97 mg/dL (ref 70–99)
Potassium: 4.4 mmol/L (ref 3.5–5.1)
Sodium: 135 mmol/L (ref 135–145)

## 2020-05-29 LAB — LIPID PANEL
Cholesterol: 161 mg/dL (ref 0–200)
HDL: 74 mg/dL (ref >40)
LDL Cholesterol: 76 mg/dL (ref 0–99)
Total CHOL/HDL Ratio: 2.2 RATIO
Triglycerides: 56 mg/dL (ref <150)
VLDL: 11 mg/dL (ref 0–40)

## 2020-06-05 ENCOUNTER — Other Ambulatory Visit: Payer: Self-pay

## 2020-06-05 ENCOUNTER — Ambulatory Visit: Payer: Medicare HMO | Admitting: Internal Medicine

## 2020-06-05 ENCOUNTER — Encounter: Payer: Self-pay | Admitting: Internal Medicine

## 2020-06-05 VITALS — BP 132/70 | HR 58 | Ht 72.0 in | Wt 162.5 lb

## 2020-06-05 DIAGNOSIS — I495 Sick sinus syndrome: Secondary | ICD-10-CM

## 2020-06-05 NOTE — H&P (View-Only) (Signed)
HPI Mr. Beigel returns today for followup. He is a pleasant 75 yo man with a h/o tach/brady syndrome with SVT at 130-140/min as well as sinus bradycardia with HR's in the 30's both at night but also during the daytime. The patient has not had syncope but does experience sob and dizziness. He has not had chest pain. No angina. He has minimal coronary plaquing on CT scan.  Allergies  Allergen Reactions  . Codeine Anaphylaxis  . Ibuprofen Anaphylaxis  . Robaxin [Methocarbamol] Anaphylaxis, Hives and Swelling  . Chlorhexidine   . Contrast Media [Iodinated Diagnostic Agents]     Itching and bumps on skin.  . Fentanyl And Related     Dry heaves (lasted for 14 hours)  . Tramadol Hcl     Changes personality, makes him angry     Current Outpatient Medications  Medication Sig Dispense Refill  . acetaminophen (TYLENOL) 500 MG tablet Take 500 mg by mouth every 6 (six) hours as needed for mild pain.     . hydroxypropyl methylcellulose / hypromellose (ISOPTO TEARS / GONIOVISC) 2.5 % ophthalmic solution Place 1 drop into both eyes 3 (three) times daily as needed for dry eyes.    Marland Kitchen lisinopril (ZESTRIL) 10 MG tablet Take 1 tablet (10 mg total) by mouth daily. 90 tablet 3  . lisinopril (ZESTRIL) 5 MG tablet Take 1 tablet (5 mg total) by mouth daily. In addition to 10 mg tablet=15 mg daily 90 tablet 3  . Multiple Vitamins-Minerals (MULTIVITAMIN WITH MINERALS) tablet Take 1 tablet by mouth daily.    . ondansetron (ZOFRAN-ODT) 8 MG disintegrating tablet Take 8 mg by mouth 3 (three) times daily.    . rosuvastatin (CRESTOR) 10 MG tablet Take 1 tablet (10 mg total) by mouth daily. 90 tablet 3  . triamcinolone (KENALOG) 0.1 % 1 application     No current facility-administered medications for this visit.     Past Medical History:  Diagnosis Date  . Acute appendicitis 01/21/2014  . Arthritis   . Dysrhythmia    PSVT, bradycardia  . History of hiatal hernia   . Hypertension     ROS:   All  systems reviewed and negative except as noted in the HPI.   Past Surgical History:  Procedure Laterality Date  . BACK SURGERY     and neck and trigger finger surgery  . CERVICAL SPINE SURGERY    . ESOPHAGOGASTRODUODENOSCOPY (EGD) WITH PROPOFOL Left 12/24/2015   Procedure: ESOPHAGOGASTRODUODENOSCOPY (EGD) WITH PROPOFOL;  Surgeon: Otis Brace, MD;  Location: [place];  Service: Gastroenterology;  Laterality: Left;  . HIATAL HERNIA REPAIR N/A 12/26/2015   Procedure: LAPAROSCOPIC REPAIR OF HIATAL HERNIA WITH MESH;  Surgeon: Michael Boston, MD;  Location: [place];  Service: General;  Laterality: N/A;  . INGUINAL HERNIA REPAIR Left 01/19/2020   Procedure: LEFT INGUINAL HERNIA REPAIR WITH MESH;  Surgeon: Stark Klein, MD;  Location: [place];  Service: General;  Laterality: Left;  . INSERTION OF MESH Left 01/19/2020   Procedure: INSERTION OF MESH;  Surgeon: Stark Klein, MD;  Location: [place];  Service: General;  Laterality: Left;  . LAPAROSCOPIC APPENDECTOMY N/A 01/21/2014   Procedure: APPENDECTOMY LAPAROSCOPIC;  Surgeon: Autumn Messing III, MD;  Location: [place];  Service: General;  Laterality: N/A;  . RESECTION OF RETROPERITONEAL MASS N/A 01/19/2020   Procedure: 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;  Surgeon: Stark Klein, MD;  Location: [place];  Service: General;  Laterality: N/A;  . TRIGGER FINGER RELEASE  Left 05/2019     Family History  Problem Relation Age of Onset  . Prostate cancer Brother   . Cancer Paternal Uncle        Unknown what type     Social History   Socioeconomic History  . Marital status: Married    Spouse name: Not on file  . Number of children: Not on file  . Years of education: Not on file  . Highest education level: Not on file  Occupational History  . Not on file  Tobacco Use  . Smoking status: Never Smoker  . Smokeless tobacco: Never Used  Vaping Use  . Vaping Use: Never used  Substance and Sexual Activity  . Alcohol use: Yes    Comment:  Occassionally  . Drug use: No  . Sexual activity: Not on file  Other Topics Concern  . Not on file  Social History Narrative  . Not on file   Social Determinants of Health   Financial Resource Strain: Not on file  Food Insecurity: Not on file  Transportation Needs: Not on file  Physical Activity: Not on file  Stress: Not on file  Social Connections: Not on file  Intimate Partner Violence: Not on file     BP 132/70   Pulse (!) 58   Ht 6' (1.829 m)   Wt 162 lb 8 oz (73.7 kg)   SpO2 97%   BMI 22.04 kg/m   Physical Exam:  Well appearing NAD HEENT: Unremarkable Neck:  No JVD, no thyromegally Lymphatics:  No adenopathy Back:  No CVA tenderness Lungs:  Clear with no wheezes HEART:  Regular rate rhythm, no murmurs, no rubs, no clicks Abd:  soft, positive bowel sounds, no organomegally, no rebound, no guarding Ext:  2 plus pulses, no edema, no cyanosis, no clubbing Skin:  No rashes no nodules Neuro:  CN II through XII intact, motor grossly intact  Cardiac monitor - sinus brady and SVT   Assess/Plan: 1. Symptomatic tach-brady syndrome - I discussed the treatment options in detail from watchful waiting, to PPM to SVT ablation and both. I think PPM with AA drug therapy and possible beta blocker would be most reasonable and discuss the risk/benefits and he will call us if he wishes to proceed.  2. Liposarcoma - he is s/p removal and XRT.   [name] Taylor,MD

## 2020-06-05 NOTE — Patient Instructions (Addendum)
Medication Instructions:  Your physician recommends that you continue on your current medications as directed. Please refer to the Current Medication list given to you today.  Dates for Pacemaker placement.  06/11/20 Monday  06/20/20   Wednesday  06/22/20  Friday   *If you need a refill on your cardiac medications before your next appointment, please call your pharmacy*   Lab Work: Your physician recommends that you return for lab work in: Just before Covid screening.   If you have labs (blood work) drawn today and your tests are completely normal, you will receive your results only by: Marland Kitchen MyChart Message (if you have MyChart) OR . A paper copy in the mail If you have any lab test that is abnormal or we need to change your treatment, we will call you to review the results.   Testing/Procedures: Your physician has recommended that you have a pacemaker inserted. A pacemaker is a small device that is placed under the skin of your chest or abdomen to help control abnormal heart rhythms. This device uses electrical pulses to prompt the heart to beat at a normal rate. Pacemakers are used to treat heart rhythms that are too slow. Wire (leads) are attached to the pacemaker that goes into the chambers of you heart. This is done in the hospital and usually requires and overnight stay. Please see the instruction sheet given to you today for more information.   Follow-Up: At [facility], you and your health needs are our priority.  As part of our continuing mission to provide you with exceptional heart care, we have created designated Provider Care Teams.  These Care Teams include your primary Cardiologist (physician) and Advanced Practice Providers (APPs -  Physician Assistants and Nurse Practitioners) who all work together to provide you with the care you need, when you need it.  We recommend signing up for the patient portal called "MyChart".  Sign up information is provided on this After Visit  Summary.  MyChart is used to connect with patients for Virtual Visits (Telemedicine).  Patients are able to view lab/test results, encounter notes, upcoming appointments, etc.  Non-urgent messages can be sent to your provider as well.   To learn more about what you can do with MyChart, go to [email or web address].    Your next appointment:   2 week(s) after Pacemaker in [place] office   The format for your next appointment:   In Person  Provider:   You may see Dr. Lovena Le  or one of the following Advanced Practice Providers on your designated Care Team:    Chanetta Marshall, NP  Tommye Standard, [place]  Legrand Como "Oda Kilts, [place]    Other Instructions Thank you for choosing [place]!

## 2020-06-05 NOTE — Progress Notes (Signed)
HPI William Avila returns today for followup. He is a pleasant 75 yo man with a h/o tach/brady syndrome with SVT at 130-140/min as well as sinus bradycardia with HR's in the 30's both at night but also during the daytime. The patient has not had syncope but does experience sob and dizziness. He has not had chest pain. No angina. He has minimal coronary plaquing on CT scan.  Allergies  Allergen Reactions  . Codeine Anaphylaxis  . Ibuprofen Anaphylaxis  . Robaxin [Methocarbamol] Anaphylaxis, Hives and Swelling  . Chlorhexidine   . Contrast Media [Iodinated Diagnostic Agents]     Itching and bumps on skin.  . Fentanyl And Related     Dry heaves (lasted for 14 hours)  . Tramadol Hcl     Changes personality, makes him angry     Current Outpatient Medications  Medication Sig Dispense Refill  . acetaminophen (TYLENOL) 500 MG tablet Take 500 mg by mouth every 6 (six) hours as needed for mild pain.     . hydroxypropyl methylcellulose / hypromellose (ISOPTO TEARS / GONIOVISC) 2.5 % ophthalmic solution Place 1 drop into both eyes 3 (three) times daily as needed for dry eyes.    Marland Kitchen lisinopril (ZESTRIL) 10 MG tablet Take 1 tablet (10 mg total) by mouth daily. 90 tablet 3  . lisinopril (ZESTRIL) 5 MG tablet Take 1 tablet (5 mg total) by mouth daily. In addition to 10 mg tablet=15 mg daily 90 tablet 3  . Multiple Vitamins-Minerals (MULTIVITAMIN WITH MINERALS) tablet Take 1 tablet by mouth daily.    . ondansetron (ZOFRAN-ODT) 8 MG disintegrating tablet Take 8 mg by mouth 3 (three) times daily.    . rosuvastatin (CRESTOR) 10 MG tablet Take 1 tablet (10 mg total) by mouth daily. 90 tablet 3  . triamcinolone (KENALOG) 0.1 % 1 application     No current facility-administered medications for this visit.     Past Medical History:  Diagnosis Date  . Acute appendicitis 01/21/2014  . Arthritis   . Dysrhythmia    PSVT, bradycardia  . History of hiatal hernia   . Hypertension     ROS:   All  systems reviewed and negative except as noted in the HPI.   Past Surgical History:  Procedure Laterality Date  . BACK SURGERY     and neck and trigger finger surgery  . CERVICAL SPINE SURGERY    . ESOPHAGOGASTRODUODENOSCOPY (EGD) WITH PROPOFOL Left 12/24/2015   Procedure: ESOPHAGOGASTRODUODENOSCOPY (EGD) WITH PROPOFOL;  Surgeon: Otis Brace, MD;  Location: [facility];  Service: Gastroenterology;  Laterality: Left;  . HIATAL HERNIA REPAIR N/A 12/26/2015   Procedure: LAPAROSCOPIC REPAIR OF HIATAL HERNIA WITH MESH;  Surgeon: Michael Boston, MD;  Location: [facility];  Service: General;  Laterality: N/A;  . INGUINAL HERNIA REPAIR Left 01/19/2020   Procedure: LEFT INGUINAL HERNIA REPAIR WITH MESH;  Surgeon: Stark Klein, MD;  Location: [facility];  Service: General;  Laterality: Left;  . INSERTION OF MESH Left 01/19/2020   Procedure: INSERTION OF MESH;  Surgeon: Stark Klein, MD;  Location: [facility];  Service: General;  Laterality: Left;  . LAPAROSCOPIC APPENDECTOMY N/A 01/21/2014   Procedure: APPENDECTOMY LAPAROSCOPIC;  Surgeon: Autumn Messing III, MD;  Location: [facility];  Service: General;  Laterality: N/A;  . RESECTION OF RETROPERITONEAL MASS N/A 01/19/2020   Procedure: 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;  Surgeon: Stark Klein, MD;  Location: [facility];  Service: General;  Laterality: N/A;  . TRIGGER FINGER RELEASE  Left 05/2019     Family History  Problem Relation Age of Onset  . Prostate cancer Brother   . Cancer Paternal Uncle        Unknown what type     Social History   Socioeconomic History  . Marital status: Married    Spouse name: Not on file  . Number of children: Not on file  . Years of education: Not on file  . Highest education level: Not on file  Occupational History  . Not on file  Tobacco Use  . Smoking status: Never Smoker  . Smokeless tobacco: Never Used  Vaping Use  . Vaping Use: Never used  Substance and Sexual Activity  . Alcohol use: Yes    Comment:  Occassionally  . Drug use: No  . Sexual activity: Not on file  Other Topics Concern  . Not on file  Social History Narrative  . Not on file   Social Determinants of Health   Financial Resource Strain: Not on file  Food Insecurity: Not on file  Transportation Needs: Not on file  Physical Activity: Not on file  Stress: Not on file  Social Connections: Not on file  Intimate Partner Violence: Not on file     BP 132/70   Pulse (!) 58   Ht 6' (1.829 m)   Wt 162 lb 8 oz (73.7 kg)   SpO2 97%   BMI 22.04 kg/m   Physical Exam:  Well appearing NAD HEENT: Unremarkable Neck:  No JVD, no thyromegally Lymphatics:  No adenopathy Back:  No CVA tenderness Lungs:  Clear with no wheezes HEART:  Regular rate rhythm, no murmurs, no rubs, no clicks Abd:  soft, positive bowel sounds, no organomegally, no rebound, no guarding Ext:  2 plus pulses, no edema, no cyanosis, no clubbing Skin:  No rashes no nodules Neuro:  CN II through XII intact, motor grossly intact  Cardiac monitor - sinus brady and SVT   Assess/Plan: 1. Symptomatic tach-brady syndrome - I discussed the treatment options in detail from watchful waiting, to PPM to SVT ablation and both. I think PPM with AA drug therapy and possible beta blocker would be most reasonable and discuss the risk/benefits and he will call us if he wishes to proceed.  2. Liposarcoma - he is s/p removal and XRT.   [name] Kasheem Toner,MD

## 2020-06-06 ENCOUNTER — Telehealth: Payer: Self-pay | Admitting: Cardiology

## 2020-06-06 ENCOUNTER — Telehealth: Payer: Self-pay | Admitting: Internal Medicine

## 2020-06-06 ENCOUNTER — Encounter: Payer: Self-pay | Admitting: *Deleted

## 2020-06-06 NOTE — Telephone Encounter (Signed)
Pt scheduled for pacemaker on March 2, 10:30 am with Dr. Lovena Le. Called to notify, no answer. Left msg to call back.

## 2020-06-06 NOTE — Telephone Encounter (Signed)
Please call to schedule his procedure

## 2020-06-06 NOTE — Telephone Encounter (Signed)
Error

## 2020-06-06 NOTE — Telephone Encounter (Signed)
Pt notified of date and time of procedure, labs and Covid screening. Pt voiced understanding.

## 2020-06-13 DIAGNOSIS — Z1389 Encounter for screening for other disorder: Secondary | ICD-10-CM | POA: Diagnosis not present

## 2020-06-13 DIAGNOSIS — R059 Cough, unspecified: Secondary | ICD-10-CM | POA: Diagnosis not present

## 2020-06-13 DIAGNOSIS — N649 Disorder of breast, unspecified: Secondary | ICD-10-CM | POA: Diagnosis not present

## 2020-06-13 DIAGNOSIS — C499 Malignant neoplasm of connective and soft tissue, unspecified: Secondary | ICD-10-CM | POA: Diagnosis not present

## 2020-06-13 DIAGNOSIS — E7800 Pure hypercholesterolemia, unspecified: Secondary | ICD-10-CM | POA: Diagnosis not present

## 2020-06-13 DIAGNOSIS — L299 Pruritus, unspecified: Secondary | ICD-10-CM | POA: Diagnosis not present

## 2020-06-13 DIAGNOSIS — R001 Bradycardia, unspecified: Secondary | ICD-10-CM | POA: Diagnosis not present

## 2020-06-13 DIAGNOSIS — I10 Essential (primary) hypertension: Secondary | ICD-10-CM | POA: Diagnosis not present

## 2020-06-13 DIAGNOSIS — Z0000 Encounter for general adult medical examination without abnormal findings: Secondary | ICD-10-CM | POA: Diagnosis not present

## 2020-06-18 ENCOUNTER — Other Ambulatory Visit: Payer: Self-pay

## 2020-06-18 ENCOUNTER — Other Ambulatory Visit (HOSPITAL_COMMUNITY)
Admission: RE | Admit: 2020-06-18 | Discharge: 2020-06-18 | Disposition: A | Discharge status: Home / Self Care | Payer: Medicare HMO | Source: Ambulatory Visit | Attending: Internal Medicine | Admitting: Internal Medicine

## 2020-06-18 DIAGNOSIS — Z20822 Contact with and (suspected) exposure to covid-19: Secondary | ICD-10-CM | POA: Insufficient documentation

## 2020-06-18 DIAGNOSIS — I495 Sick sinus syndrome: Secondary | ICD-10-CM | POA: Insufficient documentation

## 2020-06-18 DIAGNOSIS — Z01812 Encounter for preprocedural laboratory examination: Secondary | ICD-10-CM | POA: Insufficient documentation

## 2020-06-18 LAB — CBC WITH DIFFERENTIAL/PLATELET
Abs Immature Granulocytes: 0.04 10*3/uL (ref 0.00–0.07)
Basophils Absolute: 0.1 10*3/uL (ref 0.0–0.1)
Basophils Relative: 1 %
Eosinophils Absolute: 0.5 10*3/uL (ref 0.0–0.5)
Eosinophils Relative: 9 %
HCT: 39.7 % (ref 39.0–52.0)
Hemoglobin: 13.1 g/dL (ref 13.0–17.0)
Immature Granulocytes: 1 %
Lymphocytes Relative: 13 %
Lymphs Abs: 0.7 10*3/uL (ref 0.7–4.0)
MCH: 32.3 pg (ref 26.0–34.0)
MCHC: 33 g/dL (ref 30.0–36.0)
MCV: 98 fL (ref 80.0–100.0)
Monocytes Absolute: 0.5 10*3/uL (ref 0.1–1.0)
Monocytes Relative: 9 %
Neutro Abs: 3.6 10*3/uL (ref 1.7–7.7)
Neutrophils Relative %: 67 %
Platelets: 173 10*3/uL (ref 150–400)
RBC: 4.05 MIL/uL — ABNORMAL LOW (ref 4.22–5.81)
RDW: 13.7 % (ref 11.5–15.5)
WBC: 5.4 10*3/uL (ref 4.0–10.5)
nRBC: 0 % (ref 0.0–0.2)

## 2020-06-18 LAB — BASIC METABOLIC PANEL
Anion gap: 8 (ref 5–15)
BUN: 22 mg/dL (ref 8–23)
CO2: 25 mmol/L (ref 22–32)
Calcium: 9.1 mg/dL (ref 8.9–10.3)
Chloride: 102 mmol/L (ref 98–111)
Creatinine, Ser: 1.31 mg/dL — ABNORMAL HIGH (ref 0.61–1.24)
GFR, Estimated: 57 mL/min — ABNORMAL LOW (ref >60)
Glucose, Bld: 106 mg/dL — ABNORMAL HIGH (ref 70–99)
Potassium: 4.3 mmol/L (ref 3.5–5.1)
Sodium: 135 mmol/L (ref 135–145)

## 2020-06-18 LAB — SARS CORONAVIRUS 2 (TAT 6-24 HRS): SARS Coronavirus 2: NEGATIVE

## 2020-06-19 NOTE — Progress Notes (Signed)
Instructed patient on the following items: Arrival time 0830 Nothing to eat or drink after midnight No meds AM of procedure Responsible person to drive you home and stay with you for 24 hrs Wash with special soap night before and morning of procedure

## 2020-06-20 ENCOUNTER — Other Ambulatory Visit: Payer: Self-pay

## 2020-06-20 ENCOUNTER — Ambulatory Visit (HOSPITAL_COMMUNITY)
Admission: RE | Admit: 2020-06-20 | Discharge: 2020-06-20 | Disposition: A | Discharge status: Home / Self Care | Payer: Medicare HMO | Attending: Internal Medicine | Admitting: Internal Medicine

## 2020-06-20 ENCOUNTER — Ambulatory Visit (HOSPITAL_COMMUNITY): Payer: Medicare HMO

## 2020-06-20 ENCOUNTER — Ambulatory Visit (HOSPITAL_COMMUNITY)
Admission: RE | Disposition: A | Discharge status: Home / Self Care | Payer: Self-pay | Source: Home / Self Care | Attending: Internal Medicine

## 2020-06-20 DIAGNOSIS — Z79899 Other long term (current) drug therapy: Secondary | ICD-10-CM | POA: Diagnosis not present

## 2020-06-20 DIAGNOSIS — Z91041 Radiographic dye allergy status: Secondary | ICD-10-CM | POA: Insufficient documentation

## 2020-06-20 DIAGNOSIS — C499 Malignant neoplasm of connective and soft tissue, unspecified: Secondary | ICD-10-CM | POA: Insufficient documentation

## 2020-06-20 DIAGNOSIS — Z950 Presence of cardiac pacemaker: Secondary | ICD-10-CM

## 2020-06-20 DIAGNOSIS — Z885 Allergy status to narcotic agent status: Secondary | ICD-10-CM | POA: Insufficient documentation

## 2020-06-20 DIAGNOSIS — Z45018 Encounter for adjustment and management of other part of cardiac pacemaker: Secondary | ICD-10-CM | POA: Diagnosis not present

## 2020-06-20 DIAGNOSIS — I495 Sick sinus syndrome: Secondary | ICD-10-CM | POA: Diagnosis present

## 2020-06-20 DIAGNOSIS — R001 Bradycardia, unspecified: Secondary | ICD-10-CM | POA: Diagnosis not present

## 2020-06-20 HISTORY — PX: PACEMAKER IMPLANT: EP1218

## 2020-06-20 SURGERY — PACEMAKER IMPLANT

## 2020-06-20 MED ORDER — METOPROLOL TARTRATE 5 MG/5ML IV SOLN
INTRAVENOUS | Status: AC
  Filled 2020-06-20: qty 5

## 2020-06-20 MED ORDER — LIDOCAINE HCL 1 % IJ SOLN
INTRAMUSCULAR | Status: AC
  Filled 2020-06-20: qty 60

## 2020-06-20 MED ORDER — HYDRALAZINE HCL 20 MG/ML IJ SOLN
INTRAMUSCULAR | Status: AC
  Filled 2020-06-20: qty 1

## 2020-06-20 MED ORDER — LISINOPRIL 5 MG PO TABS
15.0000 mg | ORAL_TABLET | ORAL | Status: AC
  Administered 2020-06-20: 15 mg via ORAL
  Filled 2020-06-20: qty 1

## 2020-06-20 MED ORDER — SODIUM CHLORIDE 0.9 % IV SOLN
INTRAVENOUS | Status: AC | PRN
  Administered 2020-06-20: 50 mL/h via INTRAVENOUS

## 2020-06-20 MED ORDER — SODIUM CHLORIDE 0.9 % IV SOLN: INTRAVENOUS | Status: DC

## 2020-06-20 MED ORDER — CEFAZOLIN SODIUM-DEXTROSE 1-4 GM/50ML-% IV SOLN
1.0000 g | Freq: Once | INTRAVENOUS | Status: AC
  Administered 2020-06-20: 1 g via INTRAVENOUS
  Filled 2020-06-20 (×2): qty 50

## 2020-06-20 MED ORDER — POVIDONE-IODINE 10 % EX SWAB
2.0000 "application " | Freq: Once | CUTANEOUS | Status: AC
  Administered 2020-06-20: 2 via TOPICAL

## 2020-06-20 MED ORDER — CHLORHEXIDINE GLUCONATE 4 % EX LIQD: 4.0000 "application " | Freq: Once | CUTANEOUS | Status: DC

## 2020-06-20 MED ORDER — METOPROLOL TARTRATE 5 MG/5ML IV SOLN
5.0000 mg | Freq: Once | INTRAVENOUS | Status: AC
  Administered 2020-06-20: 5 mg via INTRAVENOUS

## 2020-06-20 MED ORDER — DIPHENHYDRAMINE HCL 50 MG/ML IJ SOLN
25.0000 mg | Freq: Once | INTRAMUSCULAR | Status: AC
  Administered 2020-06-20: 25 mg via INTRAVENOUS
  Filled 2020-06-20: qty 1

## 2020-06-20 MED ORDER — MIDAZOLAM HCL 5 MG/5ML IJ SOLN
INTRAMUSCULAR | Status: DC | PRN
  Administered 2020-06-20 (×2): 1 mg via INTRAVENOUS

## 2020-06-20 MED ORDER — SODIUM CHLORIDE 0.9 % IV SOLN
INTRAVENOUS | Status: AC
  Filled 2020-06-20: qty 2

## 2020-06-20 MED ORDER — CEFAZOLIN SODIUM-DEXTROSE 2-4 GM/100ML-% IV SOLN
INTRAVENOUS | Status: AC
  Filled 2020-06-20: qty 100

## 2020-06-20 MED ORDER — ONDANSETRON HCL 4 MG/2ML IJ SOLN: 4.0000 mg | Freq: Four times a day (QID) | INTRAMUSCULAR | Status: DC | PRN

## 2020-06-20 MED ORDER — HEPARIN (PORCINE) IN NACL 1000-0.9 UT/500ML-% IV SOLN
INTRAVENOUS | Status: DC | PRN
  Administered 2020-06-20: 500 mL

## 2020-06-20 MED ORDER — HYDRALAZINE HCL 20 MG/ML IJ SOLN
5.0000 mg | Freq: Once | INTRAMUSCULAR | Status: AC
  Administered 2020-06-20: 5 mg via INTRAVENOUS

## 2020-06-20 MED ORDER — ACETAMINOPHEN 325 MG PO TABS
325.0000 mg | ORAL_TABLET | ORAL | Status: DC | PRN
  Filled 2020-06-20: qty 2

## 2020-06-20 MED ORDER — SODIUM CHLORIDE 0.9 % IV SOLN
80.0000 mg | INTRAVENOUS | Status: AC
  Administered 2020-06-20: 80 mg

## 2020-06-20 MED ORDER — MIDAZOLAM HCL 5 MG/5ML IJ SOLN
INTRAMUSCULAR | Status: AC
  Filled 2020-06-20: qty 5

## 2020-06-20 MED ORDER — CEFAZOLIN SODIUM-DEXTROSE 2-4 GM/100ML-% IV SOLN
2.0000 g | INTRAVENOUS | Status: AC
  Administered 2020-06-20: 2 g via INTRAVENOUS

## 2020-06-20 MED ORDER — METHYLPREDNISOLONE SODIUM SUCC 125 MG IJ SOLR
125.0000 mg | Freq: Once | INTRAMUSCULAR | Status: AC
  Administered 2020-06-20: 125 mg via INTRAVENOUS
  Filled 2020-06-20: qty 2

## 2020-06-20 MED ORDER — LIDOCAINE HCL (PF) 1 % IJ SOLN
INTRAMUSCULAR | Status: DC | PRN
  Administered 2020-06-20: 40 mL

## 2020-06-20 SURGICAL SUPPLY — 7 items
CABLE SURGICAL S-101-97-12 (CABLE) ×2 IMPLANT
LEAD TENDRIL MRI 52CM LPA1200M (Lead) ×1 IMPLANT
LEAD TENDRIL MRI 58CM LPA1200M (Lead) ×1 IMPLANT
PACEMAKER ASSURITY DR-RF (Pacemaker) ×1 IMPLANT
PAD PRO RADIOLUCENT 2001M-C (PAD) ×2 IMPLANT
SHEATH 8FR PRELUDE SNAP 13 (SHEATH) ×4 IMPLANT
TRAY PACEMAKER INSERTION (PACKS) ×2 IMPLANT

## 2020-06-20 NOTE — Progress Notes (Signed)
Cath lab called states they will have Dr Lovena Le call me back.

## 2020-06-20 NOTE — Progress Notes (Signed)
Kandis Fantasia, PA paged r/t BP

## 2020-06-20 NOTE — Progress Notes (Signed)
Dr Lovena Le called new orders noted

## 2020-06-20 NOTE — Progress Notes (Signed)
Dr Lovena Le called and informed of EKG report. No new orders

## 2020-06-20 NOTE — Progress Notes (Signed)
[employer] here to check pacemaker

## 2020-06-20 NOTE — Discharge Instructions (Signed)
    Supplemental Discharge Instructions for  Pacemaker/Defibrillator Patients  Tomorrow, 06/21/20, send in a device transmission  Activity No heavy lifting or vigorous activity with your left/right arm for 6 to 8 weeks.  Do not raise your left/right arm above your head for one week.  Gradually raise your affected arm as drawn below.              06/28/20                    06/29/20                     3/12 22                  07/01/20 __  NO DRIVING until cleared to at your wound check visit .  WOUND CARE - Keep the wound area clean and dry.  Do not get this area wet , no showers for one week; you may shower on 06/28/20 . - Tomorrow, 06/21/20, remove the arm sling - Tomorrow, 06/21/20 remove the outer plastic bandage.  Underneath the plastic bandage there are steri strips (paper tapes), DO NOT remove these. - The tape/steri-strips on your wound will fall off; do not pull them off.  No bandage is needed on the site.  DO  NOT apply any creams, oils, or ointments to the wound area. - If you notice any drainage or discharge from the wound, any swelling or bruising at the site, or you develop a fever > 101? F after you are discharged home, call the office at once.  Special Instructions - You are still able to use cellular telephones; use the ear opposite the side where you have your pacemaker/defibrillator.  Avoid carrying your cellular phone near your device. - When traveling through airports, show security personnel your identification card to avoid being screened in the metal detectors.  Ask the security personnel to use the hand wand. - Avoid arc welding equipment, MRI testing (magnetic resonance imaging), TENS units (transcutaneous nerve stimulators).  Call the office for questions about other devices. - Avoid electrical appliances that are in poor condition or are not properly grounded. - Microwave ovens are safe to be near or to operate.

## 2020-06-20 NOTE — Progress Notes (Signed)
Tommye Standard, PA returned call new orders noted.

## 2020-06-20 NOTE — Progress Notes (Signed)
Tommye Standard, PA paged

## 2020-06-20 NOTE — Progress Notes (Signed)
Dr Lovena Le in to see pt informed of BP

## 2020-06-20 NOTE — Progress Notes (Signed)
Attempted to call Dr Lovena Le no answer.

## 2020-06-20 NOTE — Progress Notes (Signed)
Discharge instructions reviewed with pt and his wife (via telephone) both voice understanding.  

## 2020-06-20 NOTE — Progress Notes (Signed)
Tommye Standard, PA called r/t  BP

## 2020-06-20 NOTE — Interval H&P Note (Signed)
History and Physical Interval Note:  06/20/2020 9:32 AM  William Avila  has presented today for surgery, with the diagnosis of tachybrady syndrome.  The various methods of treatment have been discussed with the patient and family. After consideration of risks, benefits and other options for treatment, the patient has consented to  Procedure(s): PACEMAKER IMPLANT (N/A) as a surgical intervention.  The patient's history has been reviewed, patient examined, no change in status, stable for surgery.  I have reviewed the patient's chart and labs.  Questions were answered to the patient's satisfaction.     William Avila

## 2020-06-21 ENCOUNTER — Encounter (HOSPITAL_COMMUNITY): Payer: Self-pay | Admitting: Internal Medicine

## 2020-06-21 ENCOUNTER — Other Ambulatory Visit: Payer: Self-pay

## 2020-06-21 ENCOUNTER — Emergency Department (HOSPITAL_COMMUNITY)
Admission: EM | Admit: 2020-06-21 | Discharge: 2020-06-22 | Disposition: A | Discharge status: Home / Self Care | Payer: Medicare HMO | Attending: Emergency Medicine | Admitting: Emergency Medicine

## 2020-06-21 ENCOUNTER — Telehealth: Payer: Self-pay

## 2020-06-21 ENCOUNTER — Emergency Department (HOSPITAL_COMMUNITY): Payer: Medicare HMO

## 2020-06-21 ENCOUNTER — Ambulatory Visit: Payer: Medicare HMO | Admitting: Cardiology

## 2020-06-21 DIAGNOSIS — I10 Essential (primary) hypertension: Secondary | ICD-10-CM | POA: Insufficient documentation

## 2020-06-21 DIAGNOSIS — Z79899 Other long term (current) drug therapy: Secondary | ICD-10-CM | POA: Insufficient documentation

## 2020-06-21 DIAGNOSIS — R110 Nausea: Secondary | ICD-10-CM | POA: Insufficient documentation

## 2020-06-21 DIAGNOSIS — R0789 Other chest pain: Secondary | ICD-10-CM | POA: Insufficient documentation

## 2020-06-21 DIAGNOSIS — R0602 Shortness of breath: Secondary | ICD-10-CM | POA: Insufficient documentation

## 2020-06-21 DIAGNOSIS — R079 Chest pain, unspecified: Secondary | ICD-10-CM | POA: Diagnosis not present

## 2020-06-21 DIAGNOSIS — Z950 Presence of cardiac pacemaker: Secondary | ICD-10-CM | POA: Diagnosis not present

## 2020-06-21 LAB — BASIC METABOLIC PANEL
Anion gap: 12 (ref 5–15)
BUN: 19 mg/dL (ref 8–23)
CO2: 21 mmol/L — ABNORMAL LOW (ref 22–32)
Calcium: 9.7 mg/dL (ref 8.9–10.3)
Chloride: 104 mmol/L (ref 98–111)
Creatinine, Ser: 1.31 mg/dL — ABNORMAL HIGH (ref 0.61–1.24)
GFR, Estimated: 57 mL/min — ABNORMAL LOW (ref >60)
Glucose, Bld: 104 mg/dL — ABNORMAL HIGH (ref 70–99)
Potassium: 3.8 mmol/L (ref 3.5–5.1)
Sodium: 137 mmol/L (ref 135–145)

## 2020-06-21 LAB — CBC
HCT: 41.6 % (ref 39.0–52.0)
Hemoglobin: 14 g/dL (ref 13.0–17.0)
MCH: 32 pg (ref 26.0–34.0)
MCHC: 33.7 g/dL (ref 30.0–36.0)
MCV: 95 fL (ref 80.0–100.0)
Platelets: 196 10*3/uL (ref 150–400)
RBC: 4.38 MIL/uL (ref 4.22–5.81)
RDW: 13.6 % (ref 11.5–15.5)
WBC: 10.4 10*3/uL (ref 4.0–10.5)
nRBC: 0 % (ref 0.0–0.2)

## 2020-06-21 LAB — TROPONIN I (HIGH SENSITIVITY)
Troponin I (High Sensitivity): 25 ng/L — ABNORMAL HIGH (ref <18)
Troponin I (High Sensitivity): 26 ng/L — ABNORMAL HIGH (ref <18)

## 2020-06-21 LAB — BRAIN NATRIURETIC PEPTIDE: B Natriuretic Peptide: 115.6 pg/mL — ABNORMAL HIGH (ref 0.0–100.0)

## 2020-06-21 MED ORDER — NITROGLYCERIN 0.4 MG SL SUBL
0.4000 mg | SUBLINGUAL_TABLET | Freq: Once | SUBLINGUAL | Status: AC
  Administered 2020-06-21: 0.4 mg via SUBLINGUAL
  Filled 2020-06-21: qty 1

## 2020-06-21 MED ORDER — MORPHINE SULFATE (PF) 4 MG/ML IV SOLN
4.0000 mg | Freq: Once | INTRAVENOUS | Status: AC
  Administered 2020-06-21: 4 mg via INTRAVENOUS
  Filled 2020-06-21: qty 1

## 2020-06-21 MED ORDER — METOCLOPRAMIDE HCL 5 MG/ML IJ SOLN
10.0000 mg | Freq: Once | INTRAMUSCULAR | Status: AC
  Administered 2020-06-21: 10 mg via INTRAVENOUS
  Filled 2020-06-21: qty 2

## 2020-06-21 MED ORDER — ONDANSETRON HCL 4 MG/2ML IJ SOLN
4.0000 mg | Freq: Once | INTRAMUSCULAR | Status: AC
  Administered 2020-06-21: 4 mg via INTRAVENOUS
  Filled 2020-06-21: qty 2

## 2020-06-21 MED FILL — Lidocaine HCl Local Inj 1%: INTRAMUSCULAR | Qty: 40 | Status: AC

## 2020-06-21 MED FILL — Lidocaine HCl Local Inj 1%: INTRAMUSCULAR | Qty: 20 | Status: AC

## 2020-06-21 NOTE — ED Triage Notes (Signed)
Pt. Stated, I had a pace maker inserted yesterday and the pain and SOB. I started having chest pain and its just gotten worse all the way to a 10, making it hard to breath.

## 2020-06-21 NOTE — Telephone Encounter (Signed)
Returning phone call. Patient reports last night when he laid down his right shoulder started to hurt. States he was laying on it in the bed, had to get off of it and woke up laying on his left shoulder. Explained to patient he may need to sleep in a recliner so that he does not sleep on his left shoulder and not know about it. States he took 2 tylenol for pain last night. Pain is better today but he can still tell it is there. Describes pain in the top park of right shoulder and right shoulder blade aware. Reports his wife has rubbed the area or soreness and it does feel better when she rubs it. Advised he can use ice/heating pad to help with soreness and can take tylenol to help as well. Denies any pain that radiates to arm, chest or neck. Patient states the table he laid on during procedure was very thin and he thinks his arm was strapped in. Does reports pain is worse with movement. Denies any chest pain, shortness or palpitations. Advised patient to continue to monitor, call if no improvement. Advised if he beings to have chest pain, shortness of breath, palpiations or acute concerns, to go to the ED. Agreeable to plan.

## 2020-06-21 NOTE — Discharge Instructions (Signed)
Please follow-up with your cardiologist tomorrow.  If you have any chest pain, palpitations or other new concerning symptom, return immediately to ER for reassessment.

## 2020-06-21 NOTE — ED Notes (Signed)
Interrogation failed with phone assistance. Local [employer] interrogator representative paged and will call back.

## 2020-06-21 NOTE — Telephone Encounter (Signed)
Pt calling back to report pain in should has resolved with tylenol bnut not he has pain in center of chest.  It can be sharp at times, but mostly is achy, rate 5-6/10.  Pt denies any SOB, dizziness, N/V.  He last ate around 9:30am and fell asleep shortly after.  Attempted to assist with sending manual transmission.  Unsuccessful, pt indicates green light is lit up indicating monitor working but when he pushes send button it makes loud noise.   Pt carrying on conversation in normal manner, does not appear in distress.  Phone on speaker phone, spouse also involved in conversation.  Advised pt should try eating something, avoid laying down too soon after eating and rest.  If pain worsens or any new symptoms occur pt will need eval in ED.

## 2020-06-21 NOTE — ED Notes (Signed)
Patient transported to X-ray 

## 2020-06-21 NOTE — Telephone Encounter (Signed)
Patient called in and says he got his pacemaker put in 06/21/2020 and his right arm is having shooting pains like he "got shot" and is concerned. I let patient know a nurse will call him back

## 2020-06-21 NOTE — Telephone Encounter (Signed)
I let the patient speak with Amy, rn. He states he is actively having chest pains.

## 2020-06-22 NOTE — ED Provider Notes (Signed)
[facility] EMERGENCY DEPARTMENT Provider Note   CSN: [id number] Arrival date & time: 06/21/20  1839     History Chief Complaint  Patient presents with  . Chest Pain  . Shortness of Breath    William Avila is a 75 y.o. male.  Presents to ER with concern for chest pain.  Patient had pacemaker placed yesterday, no complications with procedure.  Today, patient started having severe chest pain, feeling short of breath due to the pain.  Pain experienced across chest, felt like sharp, stabbing pain.  Also had some associated nausea.  No vomiting.  Mild improvement with Tylenol.  Per review of chart, tachybradycardia syndrome, underwent pacemaker placement on 3/2 with Dr. Crissie Sickles.  HPI     Past Medical History:  Diagnosis Date  . Acute appendicitis 01/21/2014  . Arthritis   . Dysrhythmia    PSVT, bradycardia  . History of hiatal hernia   . Hypertension     Patient Active Problem List   Diagnosis Date Noted  . Tachycardia-bradycardia syndrome ([place]) 06/20/2020  . Hypertension 12/05/2019  . Primary osteoarthritis of first carpometacarpal joint of left hand 04/25/2019  . Incarcerated hiatal hernia s/p lap repair 12/26/2015 12/27/2015  . Epigastric pain 12/23/2015  . Dehydration 12/23/2015  . Protein-calorie malnutrition, moderate ([place]) 12/23/2015  . Leukocytosis 12/23/2015  . Liposarcoma, retroperitoneal ([place]) 12/23/2015    Past Surgical History:  Procedure Laterality Date  . BACK SURGERY     and neck and trigger finger surgery  . CERVICAL SPINE SURGERY    . ESOPHAGOGASTRODUODENOSCOPY (EGD) WITH PROPOFOL Left 12/24/2015   Procedure: ESOPHAGOGASTRODUODENOSCOPY (EGD) WITH PROPOFOL;  Surgeon: Otis Brace, MD;  Location: [facility];  Service: Gastroenterology;  Laterality: Left;  . HIATAL HERNIA REPAIR N/A 12/26/2015   Procedure: LAPAROSCOPIC REPAIR OF HIATAL HERNIA WITH MESH;  Surgeon: Michael Boston, MD;  Location: [facility];  Service: General;   Laterality: N/A;  . INGUINAL HERNIA REPAIR Left 01/19/2020   Procedure: LEFT INGUINAL HERNIA REPAIR WITH MESH;  Surgeon: Stark Klein, MD;  Location: [facility];  Service: General;  Laterality: Left;  . INSERTION OF MESH Left 01/19/2020   Procedure: INSERTION OF MESH;  Surgeon: Stark Klein, MD;  Location: [facility];  Service: General;  Laterality: Left;  . LAPAROSCOPIC APPENDECTOMY N/A 01/21/2014   Procedure: APPENDECTOMY LAPAROSCOPIC;  Surgeon: Autumn Messing III, MD;  Location: [facility];  Service: General;  Laterality: N/A;  . PACEMAKER IMPLANT N/A 06/20/2020   Procedure: PACEMAKER IMPLANT;  Surgeon: Evans Lance, MD;  Location: [facility];  Service: Cardiovascular;  Laterality: N/A;  . RESECTION OF RETROPERITONEAL MASS N/A 01/19/2020   Procedure: 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;  Surgeon: Stark Klein, MD;  Location: [facility];  Service: General;  Laterality: N/A;  . TRIGGER FINGER RELEASE Left 05/2019       Family History  Problem Relation Age of Onset  . Prostate cancer Brother   . Cancer Paternal Uncle        Unknown what type    Social History   Tobacco Use  . Smoking status: Never Smoker  . Smokeless tobacco: Never Used  Vaping Use  . Vaping Use: Never used  Substance Use Topics  . Alcohol use: Yes    Comment: Occassionally  . Drug use: No    Home Medications Prior to Admission medications   Medication Sig Start Date End Date Taking? Authorizing Provider  acetaminophen (TYLENOL) 500 MG tablet Take 500 mg by mouth every  6 (six) hours as needed for mild pain.     [provider]  hydroxypropyl methylcellulose / hypromellose (ISOPTO TEARS / GONIOVISC) 2.5 % ophthalmic solution Place 1 drop into both eyes 3 (three) times daily as needed for dry eyes.    [provider]  lisinopril (ZESTRIL) 10 MG tablet Take 1 tablet (10 mg total) by mouth daily. 04/25/20   Donato Heinz, MD  lisinopril (ZESTRIL) 5 MG tablet Take 1 tablet (5 mg  total) by mouth daily. In addition to 10 mg tablet=15 mg daily 05/21/20 08/19/20  Donato Heinz, MD  Multiple Vitamins-Minerals (MULTIVITAMIN WITH MINERALS) tablet Take 1 tablet by mouth daily.    [provider]  ondansetron (ZOFRAN-ODT) 8 MG disintegrating tablet Take 8 mg by mouth 3 (three) times daily. 01/17/20   [provider]  rosuvastatin (CRESTOR) 10 MG tablet Take 1 tablet (10 mg total) by mouth daily. 05/21/20 08/19/20  Donato Heinz, MD  triamcinolone (KENALOG) 0.1 % Apply 1 application topically daily as needed (irritation). 06/08/19   [provider]    Allergies    Codeine, Ibuprofen, Robaxin [methocarbamol], Chlorhexidine, Contrast media [iodinated diagnostic agents], Fentanyl and related, and Tramadol hcl  Review of Systems   Review of Systems  Constitutional: Negative for chills and fever.  HENT: Negative for ear pain and sore throat.   Eyes: Negative for pain and visual disturbance.  Respiratory: Negative for cough and shortness of breath.   Cardiovascular: Positive for chest pain. Negative for palpitations.  Gastrointestinal: Negative for abdominal pain and vomiting.  Genitourinary: Negative for dysuria and hematuria.  Musculoskeletal: Negative for arthralgias and back pain.  Skin: Negative for color change and rash.  Neurological: Negative for seizures and syncope.  All other systems reviewed and are negative.   Physical Exam Updated Vital Signs BP (!) 159/105   Pulse (!) 59   Temp 97.8 F (36.6 C) (Axillary)   Resp 16   SpO2 96%   Physical Exam Vitals and nursing note reviewed.  Constitutional:      Appearance: He is well-developed and well-nourished.     Comments: Appears uncomfortable from pain  HENT:     Head: Normocephalic and atraumatic.  Eyes:     Conjunctiva/sclera: Conjunctivae normal.  Cardiovascular:     Rate and Rhythm: Normal rate and regular rhythm.     Heart sounds: No murmur heard.   Pulmonary:      Effort: Pulmonary effort is normal. No respiratory distress.     Breath sounds: Normal breath sounds.  Abdominal:     Palpations: Abdomen is soft.     Tenderness: There is no abdominal tenderness.  Musculoskeletal:        General: No edema.     Cervical back: Neck supple.  Skin:    General: Skin is warm and dry.     Capillary Refill: Capillary refill takes less than 2 seconds.  Neurological:     General: No focal deficit present.     Mental Status: He is alert.  Psychiatric:        Mood and Affect: Mood and affect and mood normal.        Behavior: Behavior normal.     ED Results / Procedures / Treatments   Labs (all labs ordered are listed, but only abnormal results are displayed) Labs Reviewed  BASIC METABOLIC PANEL - Abnormal; Notable for the following components:      Result Value   CO2 21 (*)    Glucose,  Bld 104 (*)    Creatinine, Ser 1.31 (*)    GFR, Estimated 57 (*)    All other components within normal limits  BRAIN NATRIURETIC PEPTIDE - Abnormal; Notable for the following components:   B Natriuretic Peptide 115.6 (*)    All other components within normal limits  TROPONIN I (HIGH SENSITIVITY) - Abnormal; Notable for the following components:   Troponin I (High Sensitivity) 25 (*)    All other components within normal limits  TROPONIN I (HIGH SENSITIVITY) - Abnormal; Notable for the following components:   Troponin I (High Sensitivity) 26 (*)    All other components within normal limits  CBC    EKG EKG Interpretation  Date/Time:  Thursday June 21 2020 18:44:40 EST Ventricular Rate:  72 PR Interval:  154 QRS Duration: 82 QT Interval:  358 QTC Calculation: 392 R Axis:   30 Text Interpretation: Normal sinus rhythm Minimal voltage criteria for LVH, may be normal variant ( Sokolow-Lyon ) Nonspecific ST and T wave abnormality Abnormal ECG Confirmed by Madalyn Rob [phone]) on 06/21/2020 7:00:20 PM   Radiology DG Chest 2 View  Result Date:  06/21/2020 CLINICAL DATA:  Chest pain and shortness of breath. EXAM: CHEST - 2 VIEW COMPARISON:  June 20, 2020 FINDINGS: The lateral view is limited in evaluation secondary to positioning of the patient's upper extremity. A dual lead AICD is noted. The heart size and mediastinal contours are within normal limits. Both lungs are clear. A radiopaque fusion plate and screws are seen overlying the cervical spine. Radiopaque surgical clips are seen overlying the right upper quadrant. The visualized skeletal structures are unremarkable. IMPRESSION: Stable exam without active cardiopulmonary disease. Electronically Signed   By: Virgina Norfolk M.D.   On: 06/21/2020 20:10   DG Chest 2 View  Result Date: 06/20/2020 CLINICAL DATA:  Pacemaker EXAM: CHEST - 2 VIEW COMPARISON:  Chest x-ray dated 05/30/2019 FINDINGS: Interval placement of a LEFT chest wall pacemaker. Pacemaker leads appear appropriately positioned. Lungs are clear. No pleural effusion or pneumothorax is seen. No acute appearing osseous abnormality. IMPRESSION: Interval placement of a LEFT chest wall pacemaker. No pneumothorax. Electronically Signed   By: Franki Cabot M.D.   On: 06/20/2020 16:25   EP PPM/ICD IMPLANT  Result Date: 06/20/2020 CONCLUSIONS:  1. Successful implantation of a [employer] dual-chamber pacemaker for symptomatic bradycardia due to sinus node dysfunction.  2. No early apparent complications.       [name], MD 06/20/2020 12:50 PM    Procedures Procedures   Medications Ordered in ED Medications  nitroGLYCERIN (NITROSTAT) SL tablet 0.4 mg (0.4 mg Sublingual Given 06/21/20 1940)  morphine 4 MG/ML injection 4 mg (4 mg Intravenous Given 06/21/20 1939)  ondansetron (ZOFRAN) injection 4 mg (4 mg Intravenous Given 06/21/20 2007)  metoCLOPramide (REGLAN) injection 10 mg (10 mg Intravenous Given 06/21/20 2130)    ED Course  I have reviewed the triage vital signs and the nursing notes.  Pertinent labs & imaging results that were  available during my care of the patient were reviewed by me and considered in my medical decision making (see chart for details).    MDM Rules/Calculators/A&P                         75 year old gentleman presents to ER with concern for chest pain after recent procedure for pacemaker placement.  On exam, patient noted to be somewhat uncomfortable due to the pain but was in no distress and  had stable vital signs.  EKG without acute ischemic change, initial troponin minimally elevated, repeat troponin unchanged.  Low suspicion for ACS at this time.  Discussed case in detail with Dr. De Nurse on-call for cardiology.  Recommends interrogating pacemaker, if functioning appropriately, then patient can be discharged home with close follow-up with cardiology.  May be some residual pain related to the procedure and local anesthesia wearing off.  Interrogated, working properly per report.  After patient was given pain, nausea medicine, symptoms greatly improved.  On reassessment, patient was very comfortable and denied any ongoing symptoms.  Given work-up, current appearance, believe he is stable for discharge and outpatient management.  Stressed need for close follow-up with his primary cardiologist.    After the discussed management above, the patient was determined to be safe for discharge.  The patient was in agreement with this plan and all questions regarding their care were answered.  ED return precautions were discussed and the patient will return to the ED with any significant worsening of condition.     Final Clinical Impression(s) / ED Diagnoses Final diagnoses:  Chest pain, unspecified type    Rx / DC Orders ED Discharge Orders    None       Lucrezia Starch, MD 06/22/20 [phone]

## 2020-06-25 ENCOUNTER — Telehealth: Payer: Self-pay | Admitting: Internal Medicine

## 2020-06-25 NOTE — Telephone Encounter (Signed)
Spoke with William Avila on the phone. William Avila states that he feels a dull ache after a walk down his driveway, William Avila mentions that his driveway is about 1/4 mile that is hilly and gravel. William Avila mentions visit to the ED on Thursday per [facility]. Triage. William Avila is still taking PRN tylenol for aches and pains related to recent pacemaker placement. William Avila states that he occasionally has a nervous feeling in his chest and is concerned about the heart rate that the pacemaker is set at. William Avila states that his usual heart rate is in the 40 range and is wondering if the increase to 60 bpm could be causing some of these feelings. William Avila does not feel inclined to make a sooner appointment at this time with Dr. Gardiner Rhyme. William Avila states that he is ok to wait until scheduled appointment with Dr. Lovena Le for 07/05/20. William Avila states that if anything changes or if the pain gets worst or unmanageable he will call back.  Explained to William Avila reasons to revisit ED as well. William Avila verbalizes understanding.

## 2020-06-25 NOTE — Telephone Encounter (Signed)
Pt c/o of Chest Pain: STAT if CP now or developed within 24 hours  1. Are you having CP right now? no  2. Are you experiencing any other symptoms (ex. SOB, nausea, vomiting, sweating)? Nausea  3. How long have you been experiencing CP? thursday  4. Is your CP continuous or coming and going? Comes and goes  5. Have you taken Nitroglycerin? At hospital and gave morphine   Patient's wife states the patient started having chest pain Thursday and went to the ED. She states it happened early that day and kept getting worse. She states he is not having symptoms now. She states he does get some chest pain after a little activity. She states he also gets nausea during the chest pain. She states the ED told them to call the office. She states the hospital gave him nitroglycerin and morphine, which made him sick.

## 2020-06-25 NOTE — Telephone Encounter (Signed)
Looks like Dr. Gilman Schmidt is primary card. Will send to [facility] triage to follow up.

## 2020-06-26 ENCOUNTER — Other Ambulatory Visit: Payer: Self-pay

## 2020-06-26 ENCOUNTER — Ambulatory Visit (HOSPITAL_COMMUNITY)
Admission: EM | Admit: 2020-06-26 | Discharge: 2020-06-27 | Disposition: A | Discharge status: Home / Self Care | Payer: Medicare HMO | Attending: Internal Medicine | Admitting: Internal Medicine

## 2020-06-26 ENCOUNTER — Emergency Department (HOSPITAL_COMMUNITY): Payer: Medicare HMO

## 2020-06-26 ENCOUNTER — Encounter (HOSPITAL_COMMUNITY): Payer: Self-pay

## 2020-06-26 ENCOUNTER — Telehealth: Payer: Self-pay | Admitting: *Deleted

## 2020-06-26 ENCOUNTER — Emergency Department (HOSPITAL_BASED_OUTPATIENT_CLINIC_OR_DEPARTMENT_OTHER): Payer: Medicare HMO

## 2020-06-26 ENCOUNTER — Ambulatory Visit (HOSPITAL_COMMUNITY)
Admission: EM | Disposition: A | Discharge status: Home / Self Care | Payer: Self-pay | Source: Home / Self Care | Attending: Emergency Medicine

## 2020-06-26 DIAGNOSIS — R0789 Other chest pain: Secondary | ICD-10-CM | POA: Diagnosis not present

## 2020-06-26 DIAGNOSIS — Z950 Presence of cardiac pacemaker: Secondary | ICD-10-CM | POA: Insufficient documentation

## 2020-06-26 DIAGNOSIS — Y849 Medical procedure, unspecified as the cause of abnormal reaction of the patient, or of later complication, without mention of misadventure at the time of the procedure: Secondary | ICD-10-CM | POA: Diagnosis not present

## 2020-06-26 DIAGNOSIS — R079 Chest pain, unspecified: Secondary | ICD-10-CM

## 2020-06-26 DIAGNOSIS — I313 Pericardial effusion (noninflammatory): Secondary | ICD-10-CM | POA: Diagnosis not present

## 2020-06-26 DIAGNOSIS — Z959 Presence of cardiac and vascular implant and graft, unspecified: Secondary | ICD-10-CM

## 2020-06-26 DIAGNOSIS — C480 Malignant neoplasm of retroperitoneum: Secondary | ICD-10-CM

## 2020-06-26 DIAGNOSIS — Z91041 Radiographic dye allergy status: Secondary | ICD-10-CM | POA: Insufficient documentation

## 2020-06-26 DIAGNOSIS — T82190A Other mechanical complication of cardiac electrode, initial encounter: Secondary | ICD-10-CM | POA: Diagnosis not present

## 2020-06-26 DIAGNOSIS — Z885 Allergy status to narcotic agent status: Secondary | ICD-10-CM | POA: Diagnosis not present

## 2020-06-26 DIAGNOSIS — T829XXA Unspecified complication of cardiac and vascular prosthetic device, implant and graft, initial encounter: Secondary | ICD-10-CM | POA: Diagnosis present

## 2020-06-26 DIAGNOSIS — I495 Sick sinus syndrome: Secondary | ICD-10-CM | POA: Diagnosis not present

## 2020-06-26 DIAGNOSIS — Z20822 Contact with and (suspected) exposure to covid-19: Secondary | ICD-10-CM | POA: Diagnosis not present

## 2020-06-26 DIAGNOSIS — Z79899 Other long term (current) drug therapy: Secondary | ICD-10-CM | POA: Diagnosis not present

## 2020-06-26 DIAGNOSIS — R0781 Pleurodynia: Secondary | ICD-10-CM | POA: Diagnosis not present

## 2020-06-26 DIAGNOSIS — Z886 Allergy status to analgesic agent status: Secondary | ICD-10-CM | POA: Diagnosis not present

## 2020-06-26 DIAGNOSIS — T82110A Breakdown (mechanical) of cardiac electrode, initial encounter: Secondary | ICD-10-CM | POA: Diagnosis not present

## 2020-06-26 DIAGNOSIS — Z888 Allergy status to other drugs, medicaments and biological substances status: Secondary | ICD-10-CM | POA: Insufficient documentation

## 2020-06-26 HISTORY — PX: LEAD REVISION/REPAIR: EP1213

## 2020-06-26 LAB — CBC
HCT: 37 % — ABNORMAL LOW (ref 39.0–52.0)
Hemoglobin: 12.7 g/dL — ABNORMAL LOW (ref 13.0–17.0)
MCH: 33.5 pg (ref 26.0–34.0)
MCHC: 34.3 g/dL (ref 30.0–36.0)
MCV: 97.6 fL (ref 80.0–100.0)
Platelets: 177 10*3/uL (ref 150–400)
RBC: 3.79 MIL/uL — ABNORMAL LOW (ref 4.22–5.81)
RDW: 13.6 % (ref 11.5–15.5)
WBC: 6 10*3/uL (ref 4.0–10.5)
nRBC: 0 % (ref 0.0–0.2)

## 2020-06-26 LAB — BASIC METABOLIC PANEL WITH GFR
Anion gap: 6 (ref 5–15)
BUN: 16 mg/dL (ref 8–23)
CO2: 27 mmol/L (ref 22–32)
Calcium: 9.3 mg/dL (ref 8.9–10.3)
Chloride: 101 mmol/L (ref 98–111)
Creatinine, Ser: 1.16 mg/dL (ref 0.61–1.24)
GFR, Estimated: >60 mL/min
Glucose, Bld: 82 mg/dL (ref 70–99)
Potassium: 4.2 mmol/L (ref 3.5–5.1)
Sodium: 134 mmol/L — ABNORMAL LOW (ref 135–145)

## 2020-06-26 LAB — TROPONIN I (HIGH SENSITIVITY)
Troponin I (High Sensitivity): 6 ng/L (ref <18)
Troponin I (High Sensitivity): 7 ng/L (ref <18)

## 2020-06-26 LAB — ECHOCARDIOGRAM LIMITED

## 2020-06-26 LAB — RESP PANEL BY RT-PCR (FLU A&B, COVID) ARPGX2
Influenza A by PCR: NEGATIVE
Influenza B by PCR: NEGATIVE
SARS Coronavirus 2 by RT PCR: NEGATIVE

## 2020-06-26 SURGERY — LEAD REVISION/REPAIR

## 2020-06-26 MED ORDER — POVIDONE-IODINE 7.5 % EX SOLN
Freq: Once | CUTANEOUS | Status: DC
  Filled 2020-06-26: qty 118

## 2020-06-26 MED ORDER — SODIUM CHLORIDE 0.9% FLUSH
3.0000 mL | INTRAVENOUS | Status: DC | PRN
  Administered 2020-06-26: 3 mL via INTRAVENOUS

## 2020-06-26 MED ORDER — ACETAMINOPHEN 325 MG PO TABS
325.0000 mg | ORAL_TABLET | ORAL | Status: DC | PRN
Start: 2020-06-26 — End: 2020-06-27
  Administered 2020-06-26 – 2020-06-27 (×2): 650 mg via ORAL
  Filled 2020-06-26 (×2): qty 2

## 2020-06-26 MED ORDER — CEFAZOLIN SODIUM-DEXTROSE 1-4 GM/50ML-% IV SOLN
1.0000 g | Freq: Four times a day (QID) | INTRAVENOUS | Status: AC
  Administered 2020-06-26 – 2020-06-27 (×3): 1 g via INTRAVENOUS
  Filled 2020-06-26 (×3): qty 50

## 2020-06-26 MED ORDER — LIDOCAINE HCL (PF) 1 % IJ SOLN
INTRAMUSCULAR | Status: DC | PRN
  Administered 2020-06-26: 45 mL

## 2020-06-26 MED ORDER — CEFAZOLIN SODIUM-DEXTROSE 2-4 GM/100ML-% IV SOLN
2.0000 g | INTRAVENOUS | Status: AC
  Administered 2020-06-26: 2 g via INTRAVENOUS
  Filled 2020-06-26: qty 100

## 2020-06-26 MED ORDER — CEFAZOLIN SODIUM-DEXTROSE 2-4 GM/100ML-% IV SOLN
INTRAVENOUS | Status: AC
  Filled 2020-06-26: qty 100

## 2020-06-26 MED ORDER — MIDAZOLAM HCL 5 MG/5ML IJ SOLN
INTRAMUSCULAR | Status: DC | PRN
  Administered 2020-06-26: 1 mg via INTRAVENOUS
  Administered 2020-06-26: 2 mg via INTRAVENOUS

## 2020-06-26 MED ORDER — ONDANSETRON HCL 4 MG/2ML IJ SOLN
4.0000 mg | Freq: Four times a day (QID) | INTRAMUSCULAR | Status: DC | PRN
  Administered 2020-06-27: 4 mg via INTRAVENOUS
  Filled 2020-06-26 (×2): qty 2

## 2020-06-26 MED ORDER — LISINOPRIL 10 MG PO TABS
10.0000 mg | ORAL_TABLET | Freq: Every day | ORAL | Status: DC
  Administered 2020-06-27: 10 mg via ORAL
  Filled 2020-06-26 (×2): qty 1

## 2020-06-26 MED ORDER — SODIUM CHLORIDE 0.9 % IV SOLN
250.0000 mL | INTRAVENOUS | Status: DC | PRN
  Administered 2020-06-26: 250 mL via INTRAVENOUS

## 2020-06-26 MED ORDER — SODIUM CHLORIDE 0.9 % IV SOLN
80.0000 mg | INTRAVENOUS | Status: AC
  Administered 2020-06-26: 80 mg
  Filled 2020-06-26: qty 2

## 2020-06-26 MED ORDER — LIDOCAINE HCL 1 % IJ SOLN
INTRAMUSCULAR | Status: AC
  Filled 2020-06-26: qty 60

## 2020-06-26 MED ORDER — SODIUM CHLORIDE 0.9 % IV SOLN
INTRAVENOUS | Status: AC
  Filled 2020-06-26: qty 2

## 2020-06-26 MED ORDER — LISINOPRIL 5 MG PO TABS
5.0000 mg | ORAL_TABLET | Freq: Every day | ORAL | Status: DC
  Administered 2020-06-27: 5 mg via ORAL
  Filled 2020-06-26: qty 1

## 2020-06-26 MED ORDER — MIDAZOLAM HCL 5 MG/5ML IJ SOLN
INTRAMUSCULAR | Status: AC
  Filled 2020-06-26: qty 5

## 2020-06-26 MED ORDER — SODIUM CHLORIDE 0.9% FLUSH
3.0000 mL | Freq: Two times a day (BID) | INTRAVENOUS | Status: DC
  Administered 2020-06-26 – 2020-06-27 (×2): 3 mL via INTRAVENOUS

## 2020-06-26 MED ORDER — METHYLPREDNISOLONE SODIUM SUCC 125 MG IJ SOLR
125.0000 mg | Freq: Once | INTRAMUSCULAR | Status: AC
  Administered 2020-06-26: 125 mg via INTRAVENOUS
  Filled 2020-06-26: qty 2

## 2020-06-26 MED ORDER — DIPHENHYDRAMINE HCL 50 MG/ML IJ SOLN
25.0000 mg | Freq: Once | INTRAMUSCULAR | Status: AC
  Administered 2020-06-26: 25 mg via INTRAVENOUS
  Filled 2020-06-26: qty 1

## 2020-06-26 MED ORDER — SODIUM CHLORIDE 0.9 % IV SOLN: INTRAVENOUS | Status: DC

## 2020-06-26 MED ORDER — OXYCODONE HCL 5 MG PO TABS
5.0000 mg | ORAL_TABLET | Freq: Once | ORAL | Status: AC
  Administered 2020-06-26: 5 mg via ORAL
  Filled 2020-06-26: qty 1

## 2020-06-26 MED ORDER — HEPARIN (PORCINE) IN NACL 1000-0.9 UT/500ML-% IV SOLN
INTRAVENOUS | Status: AC
  Filled 2020-06-26: qty 500

## 2020-06-26 MED ORDER — HEPARIN (PORCINE) IN NACL 1000-0.9 UT/500ML-% IV SOLN
INTRAVENOUS | Status: DC | PRN
  Administered 2020-06-26: 500 mL

## 2020-06-26 SURGICAL SUPPLY — 4 items
CABLE SURGICAL S-101-97-12 (CABLE) ×2 IMPLANT
MAT PREVALON FULL STRYKER (MISCELLANEOUS) ×1 IMPLANT
PAD PRO RADIOLUCENT 2001M-C (PAD) ×2 IMPLANT
TRAY PACEMAKER INSERTION (PACKS) ×2 IMPLANT

## 2020-06-26 NOTE — Telephone Encounter (Signed)
error 

## 2020-06-26 NOTE — Telephone Encounter (Signed)
Patient reports of chest pain since 06/21/20, seen in the ED and d/c home. Reports chest pain/nausea never resolved and increased yesterday into today rating 10/10. Reports chest pain is worse with with inspiration and difficult to take deep breath in when he lays flat on his back. Spoke to Dr. Quentin Ore and he recommends patient go to the ED for evaluation. Recommends an Echo and possible lead revision. Patient called and advised of Dr. Claudie Revering recommendations. Patient advised not to drive, have someone drive him or call [phone]. Will notify Carin Hock.

## 2020-06-26 NOTE — Interval H&P Note (Signed)
History and Physical Interval Note:  06/26/2020 2:40 PM  William Avila  has presented today for surgery, with the diagnosis of lead malfunction.  The various methods of treatment have been discussed with the patient and family. After consideration of risks, benefits and other options for treatment, the patient has consented to  Procedure(s): LEAD REVISION/REPAIR (N/A) as a surgical intervention.  The patient's history has been reviewed, patient examined, no change in status, stable for surgery.  I have reviewed the patient's chart and labs.  Questions were answered to the patient's satisfaction.     [name]

## 2020-06-26 NOTE — H&P (Addendum)
ELECTROPHYSIOLOGY CONSULT NOTE    Patient ID: EESA JUSTISS MRN: [id number], DOB/AGE: [birth year] 75 y.o.  Admit date: 06/26/2020 Date of Consult: 06/26/2020  Primary Physician: [name], MD Primary Cardiologist: No primary care provider on file.  Electrophysiologist: Dr. Lovena Le  Reason for Admission: Chest pain / Likely lead issue  Patient Profile: CARLTON BUSKEY is a 75 y.o. male with a history of symptomatic bradycardia, SVT, and sinus node dysfunction who is being seen today for the evaluation of chest pain and possible lead perforation.   HPI:  JERAMI TAMMEN is a 75 y.o. male with medical history as above.   Pt underwent Dual chamber St Jude PPM 3/2 with no clear immediate post op complications. Pt noted chest discomfort that evening and reported to ED for same 3/4. Work up unremarkable. Device interrogation WNL.   Pt called device clinic this am with continued 10/10 chest pain, worse with lying flat and deep breathing. Recommended pt report to [facility].   CXR reviewed with Dr. Rayann Heman. Unclear which lead is issue. RV lead appears more apical/septal.   Pt feeling OK currently at rest. Repeats symptoms as above. No syncope, lightheadedness, or dizziness. No fevers or chills.   Past Medical History:  Diagnosis Date  . Acute appendicitis 01/21/2014  . Arthritis   . Dysrhythmia    PSVT, bradycardia  . History of hiatal hernia   . Hypertension      Surgical History:  Past Surgical History:  Procedure Laterality Date  . BACK SURGERY     and neck and trigger finger surgery  . CERVICAL SPINE SURGERY    . ESOPHAGOGASTRODUODENOSCOPY (EGD) WITH PROPOFOL Left 12/24/2015   Procedure: ESOPHAGOGASTRODUODENOSCOPY (EGD) WITH PROPOFOL;  Surgeon: Otis Brace, MD;  Location: [place];  Service: Gastroenterology;  Laterality: Left;  . HIATAL HERNIA REPAIR N/A 12/26/2015   Procedure: LAPAROSCOPIC REPAIR OF HIATAL HERNIA WITH MESH;  Surgeon: Michael Boston, MD;   Location: [place];  Service: General;  Laterality: N/A;  . INGUINAL HERNIA REPAIR Left 01/19/2020   Procedure: LEFT INGUINAL HERNIA REPAIR WITH MESH;  Surgeon: Stark Klein, MD;  Location: [place];  Service: General;  Laterality: Left;  . INSERTION OF MESH Left 01/19/2020   Procedure: INSERTION OF MESH;  Surgeon: Stark Klein, MD;  Location: [place];  Service: General;  Laterality: Left;  . LAPAROSCOPIC APPENDECTOMY N/A 01/21/2014   Procedure: APPENDECTOMY LAPAROSCOPIC;  Surgeon: Autumn Messing III, MD;  Location: [place];  Service: General;  Laterality: N/A;  . PACEMAKER IMPLANT N/A 06/20/2020   Procedure: PACEMAKER IMPLANT;  Surgeon: Evans Lance, MD;  Location: [place] [facility];  Service: Cardiovascular;  Laterality: N/A;  . 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 TRIGGER FINGER RELEASE Left 05/2019     (Not in a hospital admission)   Inpatient Medications:   Allergies:  Allergies  Allergen Reactions  . Codeine Anaphylaxis  . Ibuprofen Anaphylaxis  . Robaxin [Methocarbamol] Anaphylaxis, Hives and Swelling  . Chlorhexidine   . Contrast Media [Iodinated Diagnostic Agents]     Itching and bumps on skin.  . Fentanyl And Related     Dry heaves (lasted for 14 hours)  . Tramadol Hcl     Changes personality, makes him angry    Social History   Socioeconomic History  . Marital status: Married    Spouse name:  Not on file  . Number of children: Not on file  . Years of education: Not on file  . Highest education level: Not on file  Occupational History  . Not on file  Tobacco Use  . Smoking status: Never Smoker  . Smokeless tobacco: Never Used  Vaping Use  . Vaping Use: Never used  Substance and Sexual Activity  . Alcohol use: Yes    Comment: Occassionally  . Drug use: No  . Sexual activity: Not on file  Other Topics  Concern  . Not on file  Social History Narrative  . Not on file   Social Determinants of Health   Financial Resource Strain: Not on file  Food Insecurity: Not on file  Transportation Needs: Not on file  Physical Activity: Not on file  Stress: Not on file  Social Connections: Not on file  Intimate Partner Violence: Not on file     Family History  Problem Relation Age of Onset  . Prostate cancer Brother   . Cancer Paternal Uncle        Unknown what type     Review of Systems: All other systems reviewed and are otherwise negative except as noted above.  Physical Exam: Vitals:   06/26/20 1129  BP: (!) 157/113  Pulse: (!) 56  Resp: 18  Temp: 98.6 F (37 C)  SpO2: 100%    GEN- The patient is well appearing, alert and oriented x 3 today.   HEENT: normocephalic, atraumatic; sclera clear, conjunctiva pink; hearing intact; oropharynx clear; neck supple Lungs- Clear to ausculation bilaterally, normal work of breathing.  No wheezes, rales, rhonchi Heart- Regular rate and rhythm, no murmurs, rubs or gallops GI- soft, non-tender, non-distended, bowel sounds present Extremities- no clubbing, cyanosis, or edema; DP/PT/radial pulses 2+ bilaterally MS- no significant deformity or atrophy Skin- warm and dry, no rash or lesion Psych- euthymic mood, full affect Neuro- strength and sensation are intact  Labs:   Lab Results  Component Value Date   WBC 10.4 06/21/2020   HGB 14.0 06/21/2020   HCT 41.6 06/21/2020   MCV 95.0 06/21/2020   PLT 196 06/21/2020    Recent Labs  Lab 06/21/20 1848  NA 137  K 3.8  CL 104  CO2 21*  BUN 19  CREATININE 1.31*  CALCIUM 9.7  GLUCOSE 104*      Radiology/Studies: DG Chest 2 View  Result Date: 06/21/2020 CLINICAL DATA:  Chest pain and shortness of breath. EXAM: CHEST - 2 VIEW COMPARISON:  June 20, 2020 FINDINGS: The lateral view is limited in evaluation secondary to positioning of the patient's upper extremity. A dual lead AICD is noted.  The heart size and mediastinal contours are within normal limits. Both lungs are clear. A radiopaque fusion plate and screws are seen overlying the cervical spine. Radiopaque surgical clips are seen overlying the right upper quadrant. The visualized skeletal structures are unremarkable. IMPRESSION: Stable exam without active cardiopulmonary disease. Electronically Signed   By: Virgina Norfolk M.D.   On: 06/21/2020 20:10   DG Chest 2 View  Result Date: 06/20/2020 CLINICAL DATA:  Pacemaker EXAM: CHEST - 2 VIEW COMPARISON:  Chest x-ray dated 05/30/2019 FINDINGS: Interval placement of a LEFT chest wall pacemaker. Pacemaker leads appear appropriately positioned. Lungs are clear. No pleural effusion or pneumothorax is seen. No acute appearing osseous abnormality. IMPRESSION: Interval placement of a LEFT chest wall pacemaker. No pneumothorax. Electronically Signed   By: Franki Cabot M.D.   On: 06/20/2020 16:25   EP  PPM/ICD IMPLANT  Result Date: 06/20/2020 CONCLUSIONS:  1. Successful implantation of a St. Jude dual-chamber pacemaker for symptomatic bradycardia due to sinus node dysfunction.  2. No early apparent complications.       [name], MD 06/20/2020 12:50 PM    EKG: on arrival A paced at 65 bpm  (personally reviewed)  TELEMETRY: Not yet connected (personally reviewed)  DEVICE HISTORY: St jude DDD PPM implanted 06/20/2020 by Dr. Lovena Le  Assessment/Plan: 1.  SND s/p St Jude DDD PPM 3/2 Pt presenting with chest pain and pericarditis type symptoms.  Echo with at least moderate effusion, reviewed with Dr. Rayann Heman and Dr. Burt Knack.  Explained risks, benefits, and alternatives to PPM lead revision, including but not limited to bleeding, infection, heart attack, stroke, or death.  Pt verbalized understanding and agrees to proceed if indicated.   2. Pericardial effusion Reviewed echo with Dr. Burt Knack. Will plan lead revision and monitoring of his effusion. If does not improve may need to consider drain.    For questions or updates, please contact [place] Please consult [email or web address] for contact info under Cardiology/STEMI.  Signed, Shirley Friar, PA-C  06/26/2020 11:39 AM  I have seen, examined the patient, and reviewed the above assessment and plan.  Changes to above are made where necessary.  On exam, RRR.  The patient presents withworsening pleuritic pain s/p PPM recently for sick sinus syndrome.  He has a small effusion on echo which Dr Burt Knack and I have reviewed together.    We will plan lead revision urgently with close follow-up.  Risks, benefits, alternatives to pacemaker system revision were discussed in detail with the patient and his wife today. The patient understands that the risks include but are not limited to bleeding, infection, pneumothorax, perforation, tamponade, vascular damage, renal failure, MI, stroke, death,  and lead dislodgement and wishes to proceed.    Co Sign: [name], MD 06/26/2020 2:37 PM

## 2020-06-26 NOTE — Progress Notes (Signed)
  Echocardiogram 2D Echocardiogram has been performed.  William Avila 06/26/2020, 12:07 PM

## 2020-06-26 NOTE — ED Triage Notes (Signed)
Pt is here today due to chest pain. Pt had pacemaker placed on 3/3. Pt reports pain started yesterday.

## 2020-06-27 ENCOUNTER — Ambulatory Visit (HOSPITAL_COMMUNITY): Payer: Medicare HMO

## 2020-06-27 ENCOUNTER — Other Ambulatory Visit: Payer: Self-pay | Admitting: Student

## 2020-06-27 ENCOUNTER — Ambulatory Visit: Payer: Medicare HMO | Admitting: Cardiology

## 2020-06-27 ENCOUNTER — Ambulatory Visit (HOSPITAL_BASED_OUTPATIENT_CLINIC_OR_DEPARTMENT_OTHER): Payer: Medicare HMO

## 2020-06-27 DIAGNOSIS — I313 Pericardial effusion (noninflammatory): Secondary | ICD-10-CM

## 2020-06-27 DIAGNOSIS — Z886 Allergy status to analgesic agent status: Secondary | ICD-10-CM | POA: Diagnosis not present

## 2020-06-27 DIAGNOSIS — T82110A Breakdown (mechanical) of cardiac electrode, initial encounter: Secondary | ICD-10-CM | POA: Diagnosis not present

## 2020-06-27 DIAGNOSIS — R0781 Pleurodynia: Secondary | ICD-10-CM | POA: Diagnosis not present

## 2020-06-27 DIAGNOSIS — T82190A Other mechanical complication of cardiac electrode, initial encounter: Secondary | ICD-10-CM | POA: Diagnosis not present

## 2020-06-27 DIAGNOSIS — Z950 Presence of cardiac pacemaker: Secondary | ICD-10-CM | POA: Diagnosis not present

## 2020-06-27 DIAGNOSIS — Z20822 Contact with and (suspected) exposure to covid-19: Secondary | ICD-10-CM | POA: Diagnosis not present

## 2020-06-27 DIAGNOSIS — I3139 Other pericardial effusion (noninflammatory): Secondary | ICD-10-CM

## 2020-06-27 DIAGNOSIS — I517 Cardiomegaly: Secondary | ICD-10-CM | POA: Diagnosis not present

## 2020-06-27 DIAGNOSIS — Z91041 Radiographic dye allergy status: Secondary | ICD-10-CM | POA: Diagnosis not present

## 2020-06-27 DIAGNOSIS — Z888 Allergy status to other drugs, medicaments and biological substances status: Secondary | ICD-10-CM | POA: Diagnosis not present

## 2020-06-27 DIAGNOSIS — Z885 Allergy status to narcotic agent status: Secondary | ICD-10-CM | POA: Diagnosis not present

## 2020-06-27 DIAGNOSIS — I495 Sick sinus syndrome: Secondary | ICD-10-CM | POA: Diagnosis not present

## 2020-06-27 LAB — ECHOCARDIOGRAM LIMITED
Height: 72 in
Weight: 2462.1 oz

## 2020-06-27 MED ORDER — COLCHICINE 0.6 MG PO TABS
0.6000 mg | ORAL_TABLET | Freq: Two times a day (BID) | ORAL | Status: DC
  Administered 2020-06-27: 0.6 mg via ORAL
  Filled 2020-06-27: qty 1

## 2020-06-27 MED ORDER — COLCHICINE 0.6 MG PO TABS: 0.6000 mg | ORAL_TABLET | Freq: Two times a day (BID) | ORAL | 0 refills | Status: DC

## 2020-06-27 MED ORDER — METOPROLOL TARTRATE 25 MG PO TABS
25.0000 mg | ORAL_TABLET | Freq: Every day | ORAL | Status: DC
  Administered 2020-06-27: 25 mg via ORAL
  Filled 2020-06-27: qty 1

## 2020-06-27 MED ORDER — METOPROLOL TARTRATE 25 MG PO TABS: 25.0000 mg | ORAL_TABLET | Freq: Every day | ORAL | 3 refills | Status: DC

## 2020-06-27 MED FILL — Lidocaine HCl Local Inj 1%: INTRAMUSCULAR | Qty: 45 | Status: AC

## 2020-06-27 NOTE — Progress Notes (Signed)
  Echocardiogram 2D Echocardiogram has been performed.  William Avila 06/27/2020, 11:06 AM

## 2020-06-27 NOTE — Progress Notes (Signed)
Re look pericardial effusion s/p lead revision 3/8 and limited echo 3/9.

## 2020-06-27 NOTE — Progress Notes (Signed)
  Pt doing well this am. Chest pain has already improved.   Tele stable. CXR this am with stable lead position and no pneumothorax.   Disposition pending limited echo.   Full note pending disposition. Plan home today if effusion stable to improved with lead revision.   Legrand Como [address] [place], PA-C  06/27/2020 7:40 AM

## 2020-06-27 NOTE — Progress Notes (Signed)
Pt noted to have nausea w/ dry heaving this am, BP also elevated, other vitals WNL. PA [place] notified of pt status and vitals. Pt alert and oriented x4, denies CP/SOB, but verbalized HA. No new orders noted at this time.

## 2020-06-27 NOTE — Progress Notes (Signed)
PA called to make aware of pt status before D/C. Pt BP elevated, new order for metoprolol noted and administered. Pt alert and oriented x4, states that he "feels better and ready to go home." No other s/sx noted at this time. Pt D/C home w/ wife.

## 2020-06-27 NOTE — Discharge Summary (Addendum)
ELECTROPHYSIOLOGY PROCEDURE DISCHARGE SUMMARY    Patient ID: William Avila,  MRN: [id number], DOB/AGE: 1946/03/28 75 y.o.  Admit date: 06/26/2020 Discharge date: 06/27/2020  Primary Care Physician: [name], MD  Primary Cardiologist: No primary care provider on file.  Electrophysiologist: Dr. Lovena Le  Primary Discharge Diagnosis:  Pacemaker lead perforation  Secondary Discharge Diagnosis:  Symptomatic sinus bradycardia Sinus node dysfunction Paroxysmal atrial fibrillation (new finding)  Allergies  Allergen Reactions  . Codeine Anaphylaxis  . Ibuprofen Anaphylaxis  . Robaxin [Methocarbamol] Anaphylaxis, Hives, Swelling and Rash  . Chlorhexidine   . Contrast Media [Iodinated Diagnostic Agents] Itching and Other (See Comments)    Itching and bumps on skin.  . Fentanyl And Related Other (See Comments)    Dry heaves (lasted for 14 hours)  . Other Other (See Comments)    Nerve Block Tray: causes nausea/dry heaves  . Tramadol Hcl Other (See Comments)    Changes personality, makes him angry     Procedures This Admission:  1. Limited TTE 06/26/2020 with at least moderate pericardial effusion 2. Lead Revision (Both RA and RV) of a St. Jude dual chamber PPM on 06/26/2020 by Dr. Rayann Heman. The patients previous leads and generator ( [address] jude model number [phone] PPM with model number [id number] right atrial lead and [id number] right ventricular lead. There were no immediate post procedure complications. 3.  CXR on 06/27/20 demonstrated no pneumothorax status post device implantation.  4. Limited TTE 06/27/2020 with moderate+ pericardial effusion with likely heme involvement. (reviewed personally with Dr. Rayann Heman and Dr. Burt Knack    Brief HPI: William Avila is a 75 y.o. male was implanted with a St Jude DDD PPM for SND and symptomatic bradycardia by Dr. Lovena Le on 06/20/20. Pt had chest pain and was seen in ED 3/4 with unremarkable work up (no Echo). He continued to have chest  pain that was worse with lying flat or deep breathing, and presented to [facility] 06/26/2020 as directed by device clinic.  The patient had lead perforation causing pericardial effusion. Risks, benefits, and alternatives to PPM lead revision were reviewed with the patient who wished to proceed.   Hospital Course:  The patient was admitted and underwent lead revision of a St. Jude dual chamber PPM with details as outlined above.  He was monitored on telemetry overnight which demonstrated NSR with appropriate pacing (see below for not on PAF).  Left chest was without hematoma or ecchymosis.  The device was interrogated and found to be functioning normally.  CXR was obtained and demonstrated no pneumothorax status post device implantation. Repeat echo showed Moderate + Pericardial effusion. Reviewed with Dr. Rayann Heman and Dr. Burt Knack who agreed was relatively stable to prior to lead revision and OK for discharge with close follow up. Pt given alarm symptoms including but not limited to worsening SOB or CP (discussed that he may still have residual chest pain with pericarditic picture), syncope, near syncope, or sudden drops in blood pressure. Wound care, arm mobility, and restrictions were reviewed with the patient and his wife. The patient was examined and considered stable for discharge to home.    Pt was noted to have periods of coarse atrial fibrillation (new diagnosis) by device interrogation am of 3/9 starting just after 0900. H/o SVT/AT.  Pt is not on chronic anticoagulation, and would not start currently with pericardial effusion and lead revision. Discussed with Dr. Rayann Heman at length. Likely peri-procedural stress and would not anticoagulate at this time. We will be able to follow  his burden as he heals. Lopressor re-started. Previously stopped with bradycardia pre-pacing.   Pt for home today with very close follow up with Dr. Lovena Le next week. We are also working to arrange follow up limited echo.  He will be  discharged on 1 week course of Colchicine 0.6 mg BID.   Physical Exam: Vitals:   06/26/20 2358 06/27/20 0400 06/27/20 0405 06/27/20 1219  BP: (!) 144/98 (!) 139/97  (!) 163/111  Pulse: (!) 58 (!) 55  100  Resp: 15 12    Temp: 98.3 F (36.8 C) 98.8 F (37.1 C)    TempSrc: Oral Oral    SpO2: 95% 94%    Weight:   69.8 kg   Height:        GEN- The patient is well appearing, alert and oriented x 3 today.   HEENT: normocephalic, atraumatic; sclera clear, conjunctiva pink; hearing intact; oropharynx clear; neck supple, no JVP Lymph- no cervical lymphadenopathy Lungs- Clear to ausculation bilaterally, normal work of breathing.  No wheezes, rales, rhonchi Heart- Regular rate and rhythm, no murmurs, rubs or gallops, PMI not laterally displaced GI- soft, non-tender, non-distended, bowel sounds present, no hepatosplenomegaly Extremities- no clubbing, cyanosis, or edema; DP/PT/radial pulses 2+ bilaterally MS- no significant deformity or atrophy Skin- warm and dry, no rash or lesion, left chest without hematoma/ecchymosis Psych- euthymic mood, full affect Neuro- strength and sensation are intact   Labs:   Lab Results  Component Value Date   WBC 6.0 06/26/2020   HGB 12.7 (L) 06/26/2020   HCT 37.0 (L) 06/26/2020   MCV 97.6 06/26/2020   PLT 177 06/26/2020    Recent Labs  Lab 06/26/20 1137  NA 134*  K 4.2  CL 101  CO2 27  BUN 16  CREATININE 1.16  CALCIUM 9.3  GLUCOSE 82    Discharge Medications:  Allergies as of 06/27/2020      Reactions   Codeine Anaphylaxis   Ibuprofen Anaphylaxis   Robaxin [methocarbamol] Anaphylaxis, Hives, Swelling, Rash   Chlorhexidine    Contrast Media [iodinated Diagnostic Agents] Itching, Other (See Comments)   Itching and bumps on skin.   Fentanyl And Related Other (See Comments)   Dry heaves (lasted for 14 hours)   Other Other (See Comments)   Nerve Block Tray: causes nausea/dry heaves   Tramadol Hcl Other (See Comments)   Changes  personality, makes him angry      Medication List    TAKE these medications   acetaminophen 500 MG tablet Commonly known as: TYLENOL Take 500 mg by mouth every 6 (six) hours as needed for mild pain.   colchicine 0.6 MG tablet Take 1 tablet (0.6 mg total) by mouth 2 (two) times daily.   hydroxypropyl methylcellulose / hypromellose 2.5 % ophthalmic solution Commonly known as: ISOPTO TEARS / GONIOVISC Place 1 drop into both eyes as needed for dry eyes.   lisinopril 10 MG tablet Commonly known as: ZESTRIL Take 1 tablet (10 mg total) by mouth daily.   lisinopril 5 MG tablet Commonly known as: ZESTRIL Take 1 tablet (5 mg total) by mouth daily. In addition to 10 mg tablet=15 mg daily   metoprolol tartrate 25 MG tablet Commonly known as: LOPRESSOR Take 1 tablet (25 mg total) by mouth daily. Start taking on: June 28, 2020   multivitamin with minerals tablet Take 1 tablet by mouth daily.   ondansetron 8 MG disintegrating tablet Commonly known as: ZOFRAN-ODT Take 8 mg by mouth as needed for nausea or vomiting.  rosuvastatin 10 MG tablet Commonly known as: CRESTOR Take 1 tablet (10 mg total) by mouth daily.   triamcinolone 0.1 % Commonly known as: KENALOG Apply 1 application topically daily as needed (irritation).       Disposition:    Follow-up Information    Evans Lance, MD Follow up.   Specialty: Cardiology Why: on 07/05/2020 at 215 for post lead revision follow up for pericardial effusion Contact information: [address] [address] [phone]               Duration of Discharge Encounter: Greater than 30 minutes including physician time.  Signed, Shirley Friar, PA-C  06/27/2020 1:12 PM   I have seen, examined the patient, and reviewed the above assessment and plan.  Changes to above are made where necessary.  On exam, RRR.  CXR reveals stable leads,  Device interrogation is personally reviewed and  normal. Echo reveals stable effusion.    Will need closely outpatient follow-up with repeat limited echo in a week.  Co Sign: [name], MD 06/28/2020 9:21 AM

## 2020-06-27 NOTE — Progress Notes (Signed)
D/C instructions given and reviewed. No questions asked but encouraged to call with any concerns. Tele and IV removed, tolerated well. Will use call light once he is ready to be transported [place].

## 2020-06-27 NOTE — Plan of Care (Signed)
  Problem: Education: Goal: Knowledge of General Education information will improve Description: Including pain rating scale, medication(s)/side effects and non-pharmacologic comfort measures Outcome: Adequate for Discharge   

## 2020-06-27 NOTE — ED Provider Notes (Signed)
[facility] Provider Note   CSN: [id number] Arrival date & time: 06/26/20  1122     History Chief Complaint  Patient presents with  . Chest Pain    William Avila is a 75 y.o. male.  Presented to the emergency room with concern for chest pain.  Patient had pacemaker placed on 3/3.  Has been having intermittent chest pains ever since.  Came to ER on 3/4, work-up was reassuring, case was reviewed with cardiology and patient was discharged home with plan for close outpatient follow-up.  Today, patient started having chest pain again and call the office, they recommended he come to ER for reevaluation.  Concern for possible lead issue.  Reviewed chart, recent procedure notes, ER notes.  HPI     Past Medical History:  Diagnosis Date  . Acute appendicitis 01/21/2014  . Arthritis   . Dysrhythmia    PSVT, bradycardia  . History of hiatal hernia   . Hypertension     Patient Active Problem List   Diagnosis Date Noted  . Complication associated with cardiac pacemaker lead 06/26/2020  . Tachycardia-bradycardia syndrome ([place]) 06/20/2020  . Hypertension 12/05/2019  . Primary osteoarthritis of first carpometacarpal joint of left hand 04/25/2019  . Incarcerated hiatal hernia s/p lap repair 12/26/2015 12/27/2015  . Epigastric pain 12/23/2015  . Dehydration 12/23/2015  . Protein-calorie malnutrition, moderate ([place]) 12/23/2015  . Leukocytosis 12/23/2015  . Liposarcoma, retroperitoneal ([place]) 12/23/2015    Past Surgical History:  Procedure Laterality Date  . BACK SURGERY     and neck and trigger finger surgery  . CERVICAL SPINE SURGERY    . ESOPHAGOGASTRODUODENOSCOPY (EGD) WITH PROPOFOL Left 12/24/2015   Procedure: ESOPHAGOGASTRODUODENOSCOPY (EGD) WITH PROPOFOL;  Surgeon: Otis Brace, MD;  Location: [facility];  Service: Gastroenterology;  Laterality: Left;  . HIATAL HERNIA REPAIR N/A 12/26/2015   Procedure: LAPAROSCOPIC REPAIR OF HIATAL HERNIA WITH MESH;   Surgeon: Michael Boston, MD;  Location: [facility];  Service: General;  Laterality: N/A;  . INGUINAL HERNIA REPAIR Left 01/19/2020   Procedure: LEFT INGUINAL HERNIA REPAIR WITH MESH;  Surgeon: Stark Klein, MD;  Location: [facility];  Service: General;  Laterality: Left;  . INSERTION OF MESH Left 01/19/2020   Procedure: INSERTION OF MESH;  Surgeon: Stark Klein, MD;  Location: [facility];  Service: General;  Laterality: Left;  . LAPAROSCOPIC APPENDECTOMY N/A 01/21/2014   Procedure: APPENDECTOMY LAPAROSCOPIC;  Surgeon: Autumn Messing III, MD;  Location: [facility];  Service: General;  Laterality: N/A;  . LEAD REVISION/REPAIR N/A 06/26/2020   Procedure: LEAD REVISION/REPAIR;  Surgeon: [name], MD;  Location: [facility];  Service: Cardiovascular;  Laterality: N/A;  . PACEMAKER IMPLANT N/A 06/20/2020   Procedure: PACEMAKER IMPLANT;  Surgeon: Evans Lance, MD;  Location: [facility];  Service: Cardiovascular;  Laterality: N/A;  . 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 TRIGGER FINGER RELEASE Left 05/2019       Family History  Problem Relation Age of Onset  . Prostate cancer Brother   . Cancer Paternal Uncle        Unknown what type    Social History   Tobacco Use  . Smoking status: Never Smoker  . Smokeless tobacco: Never Used  Vaping Use  . Vaping Use: Never used  Substance Use Topics  . Alcohol use:  Yes    Comment: Occassionally  . Drug use: No    Home Medications Prior to Admission medications   Medication Sig Start Date End Date Taking? Authorizing Provider  acetaminophen (TYLENOL) 500 MG tablet Take 500 mg by mouth every 6 (six) hours as needed for mild pain.    Yes [provider]  hydroxypropyl methylcellulose / hypromellose (ISOPTO TEARS / GONIOVISC) 2.5 % ophthalmic solution Place 1 drop into both eyes as  needed for dry eyes.   Yes [provider]  lisinopril (ZESTRIL) 10 MG tablet Take 1 tablet (10 mg total) by mouth daily. 04/25/20  Yes Donato Heinz, MD  lisinopril (ZESTRIL) 5 MG tablet Take 1 tablet (5 mg total) by mouth daily. In addition to 10 mg tablet=15 mg daily 05/21/20 08/19/20 Yes Donato Heinz, MD  Multiple Vitamins-Minerals (MULTIVITAMIN WITH MINERALS) tablet Take 1 tablet by mouth daily.   Yes [provider]  ondansetron (ZOFRAN-ODT) 8 MG disintegrating tablet Take 8 mg by mouth as needed for nausea or vomiting. 01/17/20  Yes [provider]  rosuvastatin (CRESTOR) 10 MG tablet Take 1 tablet (10 mg total) by mouth daily. 05/21/20 08/19/20 Yes Donato Heinz, MD  triamcinolone (KENALOG) 0.1 % Apply 1 application topically daily as needed (irritation). 06/08/19  Yes [provider]    Allergies    Codeine, Ibuprofen, Robaxin [methocarbamol], Chlorhexidine, Contrast media [iodinated diagnostic agents], Fentanyl and related, Other, and Tramadol hcl  Review of Systems   Review of Systems  Constitutional: Negative for chills and fever.  HENT: Negative for ear pain and sore throat.   Eyes: Negative for pain and visual disturbance.  Respiratory: Negative for cough and shortness of breath.   Cardiovascular: Positive for chest pain. Negative for palpitations.  Gastrointestinal: Negative for abdominal pain and vomiting.  Genitourinary: Negative for dysuria and hematuria.  Musculoskeletal: Negative for arthralgias and back pain.  Skin: Negative for color change and rash.  Neurological: Negative for seizures and syncope.  All other systems reviewed and are negative.   Physical Exam Updated Vital Signs BP (!) 139/97 (BP Location: Right Arm)   Pulse (!) 55   Temp 98.8 F (37.1 C) (Oral)   Resp 12   Ht 6' (1.829 m)   Wt 69.8 kg   SpO2 94%   BMI 20.87 kg/m   Physical Exam Vitals and nursing note reviewed.  Constitutional:       Appearance: He is well-developed and well-nourished.  HENT:     Head: Normocephalic and atraumatic.  Eyes:     Conjunctiva/sclera: Conjunctivae normal.  Cardiovascular:     Rate and Rhythm: Normal rate and regular rhythm.     Heart sounds: No murmur heard.   Pulmonary:     Effort: Pulmonary effort is normal. No respiratory distress.     Breath sounds: Normal breath sounds.  Abdominal:     Palpations: Abdomen is soft.     Tenderness: There is no abdominal tenderness.  Musculoskeletal:        General: No edema.     Cervical back: Neck supple.  Skin:    General: Skin is warm and dry.  Neurological:     Mental Status: He is alert.  Psychiatric:        Mood and Affect: Mood and affect normal.     ED Results / Procedures / Treatments   Labs (all labs ordered are listed, but only abnormal results are displayed) Labs Reviewed  BASIC METABOLIC PANEL - Abnormal; Notable for  the following components:      Result Value   Sodium 134 (*)    All other components within normal limits  CBC - Abnormal; Notable for the following components:   RBC 3.79 (*)    Hemoglobin 12.7 (*)    HCT 37.0 (*)    All other components within normal limits  RESP PANEL BY RT-PCR (FLU A&B, COVID) ARPGX2  SURGICAL PCR SCREEN  TROPONIN I (HIGH SENSITIVITY)  TROPONIN I (HIGH SENSITIVITY)    EKG None  Radiology DG Chest 2 View  Result Date: 06/27/2020 CLINICAL DATA:  Cardiac device in situ. EXAM: CHEST - 2 VIEW COMPARISON:  06/26/2020. FINDINGS: Cardiac pacer with lead tips over the right atrium and right ventricle. Cardiomegaly. No pulmonary venous congestion. No focal infiltrate. No pleural effusion or pneumothorax. Prior cervical spine fusion. Stable lower thoracic vertebral body compression fracture. IMPRESSION: 1. Cardiac pacer with lead tips over the right atrium and right ventricle. Cardiomegaly. No pulmonary venous congestion. 2.  No acute pulmonary disease. Electronically Signed   By: Marcello Moores   Register   On: 06/27/2020 06:38   DG Chest 2 View  Result Date: 06/26/2020 CLINICAL DATA:  Chest pain EXAM: CHEST - 2 VIEW COMPARISON:  06/21/2020 FINDINGS: Lungs are hyperexpanded. The lungs are clear without focal pneumonia, edema, pneumothorax or pleural effusion. The cardiopericardial silhouette is within normal limits for size. Bones are diffusely demineralized. Telemetry leads overlie the chest. IMPRESSION: Hyperexpansion without acute cardiopulmonary findings. Electronically Signed   By: Misty Stanley M.D.   On: 06/26/2020 12:45   EP PPM/ICD IMPLANT  Result Date: 06/26/2020 SURGEON:  [name], MD   PREPROCEDURE DIAGNOSIS:  Symptomatic sinus bradycardia, pacemaker lead perforation   POSTPROCEDURE DIAGNOSIS:  Symptomatic sinus bradycardia, pacemaker lead perforation    PROCEDURES:  1. Pacemaker system revision   INTRODUCTION:  William Avila is a 75 y.o. male with a history of symptomatic sinus bradycardia.  He underwent pacemaker implantation 06/20/2020 by Dr Lovena Le.  He has developed progressive sharp chest pains.  He is found to have a pericardial effusion.  He is felt to have microperforation of a pacemaker lead.  The patient therefore presents today for pacemaker system revision.   DESCRIPTION OF PROCEDURE:  Informed written consent was obtained, and  the patient was brought to the electrophysiology lab in a fasting state.  The patient received Versed 3mg  IV as sedation for the procedure today.  The patients left chest was prepped and draped in the usual sterile fashion by the EP lab staff. The skin overlying the existing pacemaker was infiltrated with lidocaine for local analgesia.  A 4-cm incision was made over the left deltopectoral region through the same incision as his recent device implant.   Electrocautery was required to assure hemostasis.  Heme was removed from the pocket as well as small amounts of undissolved suture.  There was no other foreign matter within the pocket.  RA/RV Lead  Relocation: No contrast was required for the procedure today.  The existing right atrial and right ventricular leads were observed under fluoroscopy and felt to be in a satisfactory position overall.  There was no clear evidence of lead movement.  Electric parameters from both leads were also normal.  A stylet was placed into each lead.  The suture over the suture sleeves was removed from the body. I elected to move both atrial and RV lead. The [facility] Jude Medical Tendril MRI model [id number]  (serial number [id number]) RV lead was moved from the RV  apex to the RV mid septal location.  In this location, R waves measured 7 mV with an impedance of 573 Ohms and a threshold of 0.8V @0 .5 msec.  The [employer] Medical Tendril MRI model [id number]- 52 (serial number  [id number]) right atrial lead was moved from a lateral position to a more septal position within the right atrium.  Atrial lead P- waves measured 3.7 mV with impedance of 445 ohms and a threshold of 0.5 V at 0.5 msec.  Both leads were secured to the pectoralis fascia using #2-0 silk over the suture sleeves. Device Placement:  The leads were then reconnected to the [employer] MRI model [id number] (serial number  [id number]) pacemaker.  The pocket was irrigated with copious gentamicin solution.  The pacemaker was then placed into the pocket.  The pocket was then closed in 2 layers with 2.0 Vicryl suture for the subcutaneous and subcuticular layers.  Steri-  Strips and a sterile dressing were then applied. EBL<87ml.  There were no early apparent complications. Permanent Pacemaker Indication: Documented non-reversible symptomatic bradycardia due to sinus node dysfunction. During this procedure the patient is administered a total of Versed 3 mg to achieve and maintain moderate conscious sedation.  The patient's heart rate, blood pressure, and oxygen saturation are monitored continuously during the procedure. The period of conscious sedation is 30 minutes, of which I  was present face-to-face 100% of this time.   CONCLUSIONS:  1. Successful pacemaker system revision with the RA and RV leads moved due to micro perforation  2. No early apparent complications.       [name], MD 06/26/2020 3:51 PM   ECHOCARDIOGRAM LIMITED  Result Date: 06/26/2020    ECHOCARDIOGRAM LIMITED REPORT   Patient Name:   William Avila Date of Exam: 06/26/2020 Medical Rec #:  [id number]           Height:       72.0 in Accession #:    [id number]          Weight:       161.0 lb Date of Birth:  Jan 25, 1946           BSA:          1.943 m Patient Age:    61 years            BP:           157/113 mmHg Patient Gender: M                   HR:           65 bpm. Exam Location:  Inpatient Procedure: Limited Echo Indications:    Chest Pain R07.9                 Pericardial effusion s/p pacemaker  History:        Patient has prior history of Echocardiogram examinations, most                 recent 07/10/2019. Risk Factors:Hypertension and Non-Smoker.  Sonographer:    Vickie Epley RDCS Referring Phys: [id number] [place]  1. Left ventricular ejection fraction, by estimation, is 70 to 75%. The left ventricle has hyperdynamic function. The left ventricle has no regional wall motion abnormalities.  2. Right ventricular systolic function is normal.  3. There is a moderate sized pericardial effusion following pacer implantation. The RV and RA are not seen very well. There is no significant respiratory variability in  the MV and TV inflows. . Moderate pericardial effusion. The pericardial effusion is circumferential. FINDINGS  Left Ventricle: Left ventricular ejection fraction, by estimation, is 70 to 75%. The left ventricle has hyperdynamic function. The left ventricle has no regional wall motion abnormalities. Right Ventricle: Right ventricular systolic function is normal. Pericardium: There is a moderate sized pericardial effusion following pacer implantation. The RV and RA are not seen very well.  There is no significant respiratory variability in the MV and TV inflows. A moderately sized pericardial effusion is present. The pericardial effusion is circumferential. Mertie Moores MD Electronically signed by Mertie Moores MD Signature Date/Time: 06/26/2020/12:25:18 PM    Final     Procedures Procedures   Medications Ordered in ED Medications  lisinopril (ZESTRIL) tablet 10 mg (has no administration in time range)  lisinopril (ZESTRIL) tablet 5 mg (has no administration in time range)  sodium chloride flush (NS) 0.9 % injection 3 mL (3 mLs Intravenous Given 06/26/20 2200)  sodium chloride flush (NS) 0.9 % injection 3 mL (3 mLs Intravenous Given 06/26/20 2141)  0.9 %  sodium chloride infusion (250 mLs Intravenous New Bag/Given 06/26/20 2029)  ceFAZolin (ANCEF) IVPB 1 g/50 mL premix (1 g Intravenous New Bag/Given 06/27/20 0441)  acetaminophen (TYLENOL) tablet 325-650 mg (650 mg Oral Given 06/27/20 0443)  ondansetron (ZOFRAN) injection 4 mg (4 mg Intravenous Given 06/27/20 0621)  gentamicin (GARAMYCIN) 80 mg in sodium chloride 0.9 % 500 mL irrigation (80 mg Irrigation Given 06/26/20 1533)  ceFAZolin (ANCEF) IVPB 2g/100 mL premix (0 g Intravenous Stopped 06/26/20 1517)  diphenhydrAMINE (BENADRYL) injection 25 mg (25 mg Intravenous Given 06/26/20 1419)  methylPREDNISolone sodium succinate (SOLU-MEDROL) 125 mg/2 mL injection 125 mg (125 mg Intravenous Given 06/26/20 1419)  oxyCODONE (Oxy IR/ROXICODONE) immediate release tablet 5 mg (5 mg Oral Given 06/26/20 2137)    ED Course  I have reviewed the triage vital signs and the nursing notes.  Pertinent labs & imaging results that were available during my care of the patient were reviewed by me and considered in my medical decision making (see chart for details).    MDM Rules/Calculators/A&P                         75 year old male presenting to ER with concern for chest pain in setting of recent pacemaker placement.  Cardiology at bedside soon after patient  arrival, they will admit for echocardiogram to evaluate for pericardial effusion and likely lead revision/repair.  Currently patient appears well, minimal pain at present, vital signs stable.  Sent basic labs.  EKG today without acute ischemic change. Dr. Jackalyn Lombard team admitting.   Final Clinical Impression(s) / ED Diagnoses Final diagnoses:  Chest pain, unspecified type    Rx / DC Orders ED Discharge Orders    None       Lucrezia Starch, MD 06/27/20 [phone]

## 2020-06-28 ENCOUNTER — Telehealth: Payer: Self-pay | Admitting: Student

## 2020-06-28 NOTE — Telephone Encounter (Signed)
    Pt c/o medication issue:  1. Name of Medication: colchicine 0.6 MG tablet  2. How are you currently taking this medication (dosage and times per day)? Take 1 tablet (0.6 mg total) by mouth 2 (two) times daily.  3. Are you having a reaction (difficulty breathing--STAT)?  4. What is your medication issue? Pt's wife said when they were in the pharmacy they were told that there's a drug interaction with colchicine and crestor. She wanted to know if pt needs to stopped taking his crestor and take colchicine. Also, she said pt's echo should be scheduled before pt sees Dr. Lovena Le, however, no appt available for echo before 07/05/20. She wanted to know if that's ok getting echo after his appt with Dr. Lovena Le

## 2020-06-28 NOTE — Telephone Encounter (Signed)
Colchicine can increase concentrations of rosuvastatin. This is not a clinically relevant interaction since pt takes a low dose of rosuvastatin at 10mg  daily. Called pt who is aware he can safetly take both colchicine and rosuvastatin, and to be on the lookout for potential muscle aches although this is not expected to occur.  He is aware he will receive a separate call to follow up with his echo question.

## 2020-06-28 NOTE — Telephone Encounter (Signed)
Will forward to pharm-d as well as Oda Kilts, [facility] covering [place] April G.

## 2020-06-28 NOTE — Telephone Encounter (Signed)
Patient contacted and made aware of time and date of Echo. He agreed.

## 2020-07-03 ENCOUNTER — Other Ambulatory Visit: Payer: Self-pay | Admitting: General Surgery

## 2020-07-03 DIAGNOSIS — C480 Malignant neoplasm of retroperitoneum: Secondary | ICD-10-CM

## 2020-07-05 ENCOUNTER — Encounter: Payer: Medicare HMO | Admitting: Internal Medicine

## 2020-07-05 ENCOUNTER — Ambulatory Visit: Payer: Medicare HMO

## 2020-07-06 ENCOUNTER — Encounter: Payer: Self-pay | Admitting: Student

## 2020-07-06 ENCOUNTER — Ambulatory Visit (HOSPITAL_COMMUNITY): Payer: Medicare HMO | Attending: Student

## 2020-07-06 ENCOUNTER — Ambulatory Visit (INDEPENDENT_AMBULATORY_CARE_PROVIDER_SITE_OTHER): Payer: Medicare HMO | Admitting: Student

## 2020-07-06 ENCOUNTER — Other Ambulatory Visit: Payer: Self-pay

## 2020-07-06 VITALS — BP 132/88 | HR 70 | Ht 72.0 in | Wt 163.0 lb

## 2020-07-06 DIAGNOSIS — Z950 Presence of cardiac pacemaker: Secondary | ICD-10-CM

## 2020-07-06 DIAGNOSIS — I313 Pericardial effusion (noninflammatory): Secondary | ICD-10-CM

## 2020-07-06 DIAGNOSIS — I495 Sick sinus syndrome: Secondary | ICD-10-CM | POA: Diagnosis not present

## 2020-07-06 DIAGNOSIS — R001 Bradycardia, unspecified: Secondary | ICD-10-CM | POA: Diagnosis not present

## 2020-07-06 DIAGNOSIS — I4819 Other persistent atrial fibrillation: Secondary | ICD-10-CM

## 2020-07-06 DIAGNOSIS — I3139 Other pericardial effusion (noninflammatory): Secondary | ICD-10-CM

## 2020-07-06 LAB — CUP PACEART INCLINIC DEVICE CHECK
Battery Remaining Longevity: 78 mo
Battery Voltage: 2.99 V
Brady Statistic RA Percent Paced: 0.01 %
Brady Statistic RV Percent Paced: 90 %
Date Time Interrogation Session: 20220318084110
Implantable Lead Implant Date: 20220302
Implantable Lead Implant Date: 20220302
Implantable Lead Location: 753859
Implantable Lead Location: 753860
Implantable Pulse Generator Implant Date: 20220302
Lead Channel Impedance Value: 387.5 Ohm
Lead Channel Impedance Value: 475 Ohm
Lead Channel Pacing Threshold Amplitude: 0.5 V
Lead Channel Pacing Threshold Pulse Width: 0.5 ms
Lead Channel Sensing Intrinsic Amplitude: 0.9 mV
Lead Channel Sensing Intrinsic Amplitude: 12 mV
Lead Channel Setting Pacing Amplitude: 3.5 V
Lead Channel Setting Pacing Amplitude: 3.5 V
Lead Channel Setting Pacing Pulse Width: 0.5 ms
Lead Channel Setting Sensing Sensitivity: 2 mV
Pulse Gen Model: 2272
Pulse Gen Serial Number: 6376316

## 2020-07-06 LAB — ECHOCARDIOGRAM LIMITED: Area-P 1/2: 5.02 cm2

## 2020-07-06 MED ORDER — APIXABAN 5 MG PO TABS: 5.0000 mg | ORAL_TABLET | Freq: Two times a day (BID) | ORAL | 3 refills | Status: DC

## 2020-07-06 NOTE — Patient Instructions (Addendum)
Medication Instructions:  Your physician has recommended you make the following change in your medication:   START: Eliquis 5mg  two times daily on 07/18/2020  *If you need a refill on your cardiac medications before your next appointment, please call your pharmacy*   Lab Work: BMET, CBC on same day as Echo If you have labs (blood work) drawn today and your tests are completely normal, you will receive your results only by: Marland Kitchen MyChart Message (if you have MyChart) OR . A paper copy in the mail If you have any lab test that is abnormal or we need to change your treatment, we will call you to review the results.  Testing: Your physician has requested that you have an echocardiogram. Echocardiography is a painless test that uses sound waves to create images of your heart. It provides your doctor with information about the size and shape of your heart and how well your heart's chambers and valves are working. This procedure takes approximately one hour. There are no restrictions for this procedure.  Follow-Up: At [facility], you and your health needs are our priority.  As part of our continuing mission to provide you with exceptional heart care, we have created designated Provider Care Teams.  These Care Teams include your primary Cardiologist (physician) and Advanced Practice Providers (APPs -  Physician Assistants and Nurse Practitioners) who all work together to provide you with the care you need, when you need it.  Your next appointment:   As scheduled with Dr Lovena Le

## 2020-07-06 NOTE — Progress Notes (Signed)
Electrophysiology Office Note Date: 07/06/2020  ID:  William Avila, DOB 09/28/1945, MRN [id number]  PCP: [name], MD Primary Cardiologist: No primary care provider on file. Electrophysiologist: [name], MD   CC: Pacemaker follow-up  William Avila is a 75 y.o. male seen today for [name], MD for post hospital follow up.  Since discharge from hospital the patient reports doing OK. His pericarditic symptoms has greatly improved. He has minimal chest pain that continues to improve. He has had mild fatigue since lead revision, consistent with AF on his device. Rates controlled for the most part. His HRs have been consistently in the 70s at home (Mode switch rate).  he denies palpitations, dyspnea, PND, orthopnea, nausea, vomiting, dizziness, syncope, edema, weight gain, or early satiety.  Device History: St. Jude Dual Chamber PPM implanted 06/20/2020 for symptomatic bradycardia. S/p Lead revision 06/26/2020 due to pericardial effusion  Past Medical History:  Diagnosis Date  . Acute appendicitis 01/21/2014  . Arthritis   . Dysrhythmia    PSVT, bradycardia  . History of hiatal hernia   . Hypertension    Past Surgical History:  Procedure Laterality Date  . BACK SURGERY     and neck and trigger finger surgery  . CERVICAL SPINE SURGERY    . ESOPHAGOGASTRODUODENOSCOPY (EGD) WITH PROPOFOL Left 12/24/2015   Procedure: ESOPHAGOGASTRODUODENOSCOPY (EGD) WITH PROPOFOL;  Surgeon: Otis Brace, MD;  Location: [facility];  Service: Gastroenterology;  Laterality: Left;  . HIATAL HERNIA REPAIR N/A 12/26/2015   Procedure: LAPAROSCOPIC REPAIR OF HIATAL HERNIA WITH MESH;  Surgeon: Michael Boston, MD;  Location: [facility];  Service: General;  Laterality: N/A;  . INGUINAL HERNIA REPAIR Left 01/19/2020   Procedure: LEFT INGUINAL HERNIA REPAIR WITH MESH;  Surgeon: Stark Klein, MD;  Location: [facility];  Service: General;  Laterality: Left;  . INSERTION OF MESH Left 01/19/2020   Procedure:  INSERTION OF MESH;  Surgeon: Stark Klein, MD;  Location: [facility];  Service: General;  Laterality: Left;  . LAPAROSCOPIC APPENDECTOMY N/A 01/21/2014   Procedure: APPENDECTOMY LAPAROSCOPIC;  Surgeon: Autumn Messing III, MD;  Location: [facility];  Service: General;  Laterality: N/A;  . LEAD REVISION/REPAIR N/A 06/26/2020   Procedure: LEAD REVISION/REPAIR;  Surgeon: [name], MD;  Location: [facility];  Service: Cardiovascular;  Laterality: N/A;  . PACEMAKER IMPLANT N/A 06/20/2020   Procedure: PACEMAKER IMPLANT;  Surgeon: Evans Lance, MD;  Location: [facility];  Service: Cardiovascular;  Laterality: N/A;  . 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 TRIGGER FINGER RELEASE Left 05/2019    Current Outpatient Medications  Medication Sig Dispense Refill  . acetaminophen (TYLENOL) 500 MG tablet Take 500 mg by mouth every 6 (six) hours as needed for mild pain.     Marland Kitchen apixaban (ELIQUIS) 5 MG TABS tablet Take 1 tablet (5 mg total) by mouth 2 (two) times daily. 60 tablet 3  . hydroxypropyl methylcellulose / hypromellose (ISOPTO TEARS / GONIOVISC) 2.5 % ophthalmic solution Place 1 drop into both eyes as needed for dry eyes.    Marland Kitchen lisinopril (ZESTRIL) 10 MG tablet Take 1 tablet (10 mg total) by mouth daily. 90 tablet 3  . lisinopril (ZESTRIL) 5 MG tablet Take 1 tablet (5 mg total) by mouth daily. In addition to 10 mg tablet=15 mg daily 90 tablet 3  . metoprolol  tartrate (LOPRESSOR) 25 MG tablet Take 1 tablet (25 mg total) by mouth daily. 90 tablet 3  . Multiple Vitamins-Minerals (MULTIVITAMIN WITH MINERALS) tablet Take 1 tablet by mouth daily.    . ondansetron (ZOFRAN-ODT) 8 MG disintegrating tablet Take 8 mg by mouth as needed for nausea or vomiting.    . rosuvastatin (CRESTOR) 10 MG tablet Take 1 tablet (10 mg total) by mouth daily. 90  tablet 3  . triamcinolone (KENALOG) 0.1 % Apply 1 application topically daily as needed (irritation).     No current facility-administered medications for this visit.    Allergies:   Codeine, Ibuprofen, Robaxin [methocarbamol], Chlorhexidine, Contrast media [iodinated diagnostic agents], Fentanyl and related, Other, and Tramadol hcl   Social History: Social History   Socioeconomic History  . Marital status: Married    Spouse name: Not on file  . Number of children: Not on file  . Years of education: Not on file  . Highest education level: Not on file  Occupational History  . Not on file  Tobacco Use  . Smoking status: Never Smoker  . Smokeless tobacco: Never Used  Vaping Use  . Vaping Use: Never used  Substance and Sexual Activity  . Alcohol use: Yes    Comment: Occassionally  . Drug use: No  . Sexual activity: Not on file  Other Topics Concern  . Not on file  Social History Narrative  . Not on file   Social Determinants of Health   Financial Resource Strain: Not on file  Food Insecurity: Not on file  Transportation Needs: Not on file  Physical Activity: Not on file  Stress: Not on file  Social Connections: Not on file  Intimate Partner Violence: Not on file    Family History: Family History  Problem Relation Age of Onset  . Prostate cancer Brother   . Cancer Paternal Uncle        Unknown what type     Review of Systems: All other systems reviewed and are otherwise negative except as noted above.  Physical Exam: Vitals:   07/06/20 0821  BP: 132/88  Pulse: 70  SpO2: 98%  Weight: 163 lb (73.9 kg)  Height: 6' (1.829 m)     GEN- The patient is well appearing, alert and oriented x 3 today.   HEENT: normocephalic, atraumatic; sclera clear, conjunctiva pink; hearing intact; oropharynx clear; neck supple  Lungs- Clear to ausculation bilaterally, normal work of breathing.  No wheezes, rales, rhonchi Heart- Regular rate and rhythm, no murmurs, rubs or  gallops  GI- soft, non-tender, non-distended, bowel sounds present  Extremities- no clubbing or cyanosis. No edema MS- no significant deformity or atrophy Skin- warm and dry, no rash or lesion; PPM pocket well healed Psych- euthymic mood, full affect Neuro- strength and sensation are intact  PPM Interrogation- reviewed in detail today,  See PACEART report  EKG:  EKG is not ordered today.   Recent Labs: 01/22/2020: ALT 78; Magnesium 1.8 06/21/2020: B Natriuretic Peptide 115.6 06/26/2020: BUN 16; Creatinine, Ser 1.16; Hemoglobin 12.7; Platelets 177; Potassium 4.2; Sodium 134   Wt Readings from Last 3 Encounters:  07/06/20 163 lb (73.9 kg)  06/27/20 153 lb 14.1 oz (69.8 kg)  06/20/20 161 lb (73 kg)     Other studies Reviewed: Additional studies/ records that were reviewed today include: Previous EP office notes, Previous remote checks, Most recent labwork.   Assessment and Plan:  1. Symptomatic bradycardia s/p St. Jude PPM  Normal PPM function See Claudia Desanctis Art report  No changes today Steri strips removed today. Wound site well healing without drainage or bleeding.  2. Pericardial effusion / Pericarditis Appears improved by limited echo by my review, but poor parasternal images (Formal read pending) Finished course of colchicine  3. Persistent Atrial fibrillation Rates controlled CHA2DS2VASC of at least 2 for HTN and age (soon to be 3)   Reviewed personally with Dr. Lovena Le.  Will start Eliquis in 1-2 weeks pending Echo availability, as will want limited echo 1 week post starting [place].   ADDENDUM First available echo is 4/6. Will also check labs that day.  Will start Eliquis 3/30 per Dr. Lovena Le.   Office follow up made in [place] 4/19 to discuss rhythm control once anticoagulated with symptoms of fatigue. (Pt would prefer to avoid TEE if possible)  Current medicines are reviewed at length with the patient today.   The patient does not have concerns regarding his medicines.  The  following changes were made today:  none  Labs/ tests ordered today include:  Orders Placed This Encounter  Procedures  . Basic metabolic panel  . CBC  . CUP PACEART [place]  . ECHOCARDIOGRAM COMPLETE   Disposition:   Follow up with Dr. Lovena Le as scheduled  Signed, Shirley Friar, PA-C  07/06/2020 9:54 AM  [facility] [address] [place] [address] [phone] (office) [phone] (fax)

## 2020-07-13 ENCOUNTER — Telehealth: Payer: Self-pay

## 2020-07-13 MED ORDER — PREDNISONE 50 MG PO TABS: ORAL_TABLET | ORAL | 0 refills | Status: DC

## 2020-07-13 NOTE — Telephone Encounter (Signed)
Phone call to patient to review instructions for 13 hr prep for CT w/ contrast on 07/17/20  at 12:40 PM. Prescription called into [facility]. Pt aware and verbalized understanding of instructions. Pt reports he has benadryl at home and does not which for this to be called in as a prescription.   Prescription: Pt to take 50 mg of prednisone on 07/16/20 at 11:40 PM, 50 mg of prednisone on 07/17/20 at 5:40 AM, and 50 mg of prednisone on 07/17/20 at 11:40 AM. Pt is also to take 50 mg of benadryl on 07/17/20 at 11:40 AM. Please call [phone] with any questions.

## 2020-07-17 ENCOUNTER — Ambulatory Visit
Admission: RE | Admit: 2020-07-17 | Discharge: 2020-07-17 | Disposition: A | Discharge status: Home / Self Care | Payer: Medicare HMO | Source: Ambulatory Visit | Attending: General Surgery | Admitting: General Surgery

## 2020-07-17 ENCOUNTER — Other Ambulatory Visit: Payer: Self-pay

## 2020-07-17 DIAGNOSIS — C480 Malignant neoplasm of retroperitoneum: Secondary | ICD-10-CM | POA: Diagnosis not present

## 2020-07-17 DIAGNOSIS — N400 Enlarged prostate without lower urinary tract symptoms: Secondary | ICD-10-CM | POA: Diagnosis not present

## 2020-07-17 DIAGNOSIS — I2510 Atherosclerotic heart disease of native coronary artery without angina pectoris: Secondary | ICD-10-CM | POA: Diagnosis not present

## 2020-07-17 DIAGNOSIS — I700 Atherosclerosis of aorta: Secondary | ICD-10-CM | POA: Diagnosis not present

## 2020-07-17 DIAGNOSIS — N320 Bladder-neck obstruction: Secondary | ICD-10-CM | POA: Diagnosis not present

## 2020-07-17 DIAGNOSIS — R911 Solitary pulmonary nodule: Secondary | ICD-10-CM | POA: Diagnosis not present

## 2020-07-17 DIAGNOSIS — K5730 Diverticulosis of large intestine without perforation or abscess without bleeding: Secondary | ICD-10-CM | POA: Diagnosis not present

## 2020-07-17 MED ORDER — IOPAMIDOL (ISOVUE-300) INJECTION 61%
100.0000 mL | Freq: Once | INTRAVENOUS | Status: AC | PRN
  Administered 2020-07-17: 100 mL via INTRAVENOUS

## 2020-07-18 ENCOUNTER — Other Ambulatory Visit (HOSPITAL_COMMUNITY): Payer: Medicare HMO

## 2020-07-25 ENCOUNTER — Ambulatory Visit (HOSPITAL_COMMUNITY)
Admission: RE | Admit: 2020-07-25 | Discharge: 2020-07-25 | Disposition: A | Discharge status: Home / Self Care | Payer: Medicare HMO | Source: Ambulatory Visit | Attending: Student | Admitting: Student

## 2020-07-25 ENCOUNTER — Other Ambulatory Visit: Payer: Self-pay

## 2020-07-25 DIAGNOSIS — I3139 Other pericardial effusion (noninflammatory): Secondary | ICD-10-CM

## 2020-07-25 DIAGNOSIS — I313 Pericardial effusion (noninflammatory): Secondary | ICD-10-CM | POA: Insufficient documentation

## 2020-07-25 LAB — ECHOCARDIOGRAM COMPLETE
AR max vel: 2.41 cm2
AV Area VTI: 2.45 cm2
AV Area mean vel: 2.55 cm2
AV Mean grad: 3.1 mmHg
AV Peak grad: 6.1 mmHg
Ao pk vel: 1.24 m/s
Area-P 1/2: 5.42 cm2
MV M vel: 4.68 m/s
MV Peak grad: 87.6 mmHg
S' Lateral: 2.5 cm

## 2020-07-25 NOTE — Progress Notes (Signed)
*  PRELIMINARY RESULTS* Echocardiogram 2D Echocardiogram has been performed.  William Avila 07/25/2020, 9:23 AM

## 2020-08-07 ENCOUNTER — Ambulatory Visit (INDEPENDENT_AMBULATORY_CARE_PROVIDER_SITE_OTHER): Payer: Medicare HMO | Admitting: Internal Medicine

## 2020-08-07 ENCOUNTER — Other Ambulatory Visit: Payer: Self-pay

## 2020-08-07 ENCOUNTER — Encounter: Payer: Self-pay | Admitting: Internal Medicine

## 2020-08-07 VITALS — BP 120/82 | HR 68 | Ht 72.0 in | Wt 160.0 lb

## 2020-08-07 DIAGNOSIS — I3139 Other pericardial effusion (noninflammatory): Secondary | ICD-10-CM

## 2020-08-07 DIAGNOSIS — I4819 Other persistent atrial fibrillation: Secondary | ICD-10-CM

## 2020-08-07 DIAGNOSIS — I313 Pericardial effusion (noninflammatory): Secondary | ICD-10-CM

## 2020-08-07 DIAGNOSIS — I495 Sick sinus syndrome: Secondary | ICD-10-CM | POA: Diagnosis not present

## 2020-08-07 NOTE — Patient Instructions (Signed)
Medication Instructions:  Your physician recommends that you continue on your current medications as directed. Please refer to the Current Medication list given to you today.  *If you need a refill on your cardiac medications before your next appointment, please call your pharmacy*   Lab Work: None today  If you have labs (blood work) drawn today and your tests are completely normal, you will receive your results only by: . MyChart Message (if you have MyChart) OR . A paper copy in the mail If you have any lab test that is abnormal or we need to change your treatment, we will call you to review the results.   Testing/Procedures: None today    Follow-Up: At [facility], you and your health needs are our priority.  As part of our continuing mission to provide you with exceptional heart care, we have created designated Provider Care Teams.  These Care Teams include your primary Cardiologist (physician) and Advanced Practice Providers (APPs -  Physician Assistants and Nurse Practitioners) who all work together to provide you with the care you need, when you need it.  We recommend signing up for the patient portal called "MyChart".  Sign up information is provided on this After Visit Summary.  MyChart is used to connect with patients for Virtual Visits (Telemedicine).  Patients are able to view lab/test results, encounter notes, upcoming appointments, etc.  Non-urgent messages can be sent to your provider as well.   To learn more about what you can do with MyChart, go to [email or web address].    Your next appointment:   12 month(s)  The format for your next appointment:   In Person  Provider:   Gregg Taylor, MD   Other Instructions  None      

## 2020-08-07 NOTE — Progress Notes (Signed)
HPI [name] returns today for followup. He is a pleasant 75 yo man with a h/o bradycardia who underwent PPM insertion complicated by a late chest pain/pericardial effusion s/p lead revision. Repeat echo done almost 2 weeks ago demonstrated no residual effusion. He has a h/o atrial fib. He denies chest pain though he notes some dyspnea and fatigue when he walks up an incline. No palpitations. Allergies  Allergen Reactions  . Codeine Anaphylaxis  . Ibuprofen Anaphylaxis  . Robaxin [Methocarbamol] Anaphylaxis, Hives, Swelling and Rash  . Chlorhexidine   . Contrast Media [Iodinated Diagnostic Agents] Itching and Other (See Comments)    Itching and bumps on skin.  . Fentanyl And Related Other (See Comments)    Dry heaves (lasted for 14 hours)  . Other Other (See Comments)    Nerve Block Tray: causes nausea/dry heaves  . Tramadol Hcl Other (See Comments)    Changes personality, makes him angry     Current Outpatient Medications  Medication Sig Dispense Refill  . acetaminophen (TYLENOL) 500 MG tablet Take 500 mg by mouth every 6 (six) hours as needed for mild pain.     Marland Kitchen apixaban (ELIQUIS) 5 MG TABS tablet Take 1 tablet (5 mg total) by mouth 2 (two) times daily. 60 tablet 3  . hydroxypropyl methylcellulose / hypromellose (ISOPTO TEARS / GONIOVISC) 2.5 % ophthalmic solution Place 1 drop into both eyes as needed for dry eyes.    Marland Kitchen lisinopril (ZESTRIL) 10 MG tablet Take 1 tablet (10 mg total) by mouth daily. 90 tablet 3  . lisinopril (ZESTRIL) 5 MG tablet Take 1 tablet (5 mg total) by mouth daily. In addition to 10 mg tablet=15 mg daily 90 tablet 3  . metoprolol tartrate (LOPRESSOR) 25 MG tablet Take 1 tablet (25 mg total) by mouth daily. 90 tablet 3  . Multiple Vitamins-Minerals (MULTIVITAMIN WITH MINERALS) tablet Take 1 tablet by mouth daily.    . ondansetron (ZOFRAN-ODT) 8 MG disintegrating tablet Take 8 mg by mouth as needed for nausea or vomiting.    . rosuvastatin (CRESTOR)  10 MG tablet Take 1 tablet (10 mg total) by mouth daily. 90 tablet 3  . triamcinolone (KENALOG) 0.1 % Apply 1 application topically daily as needed (irritation).     No current facility-administered medications for this visit.     Past Medical History:  Diagnosis Date  . Acute appendicitis 01/21/2014  . Arthritis   . Dysrhythmia    PSVT, bradycardia  . History of hiatal hernia   . Hypertension     ROS:   All systems reviewed and negative except as noted in the HPI.   Past Surgical History:  Procedure Laterality Date  . BACK SURGERY     and neck and trigger finger surgery  . CERVICAL SPINE SURGERY    . ESOPHAGOGASTRODUODENOSCOPY (EGD) WITH PROPOFOL Left 12/24/2015   Procedure: ESOPHAGOGASTRODUODENOSCOPY (EGD) WITH PROPOFOL;  Surgeon: Otis Brace, MD;  Location: [facility];  Service: Gastroenterology;  Laterality: Left;  . HIATAL HERNIA REPAIR N/A 12/26/2015   Procedure: LAPAROSCOPIC REPAIR OF HIATAL HERNIA WITH MESH;  Surgeon: Michael Boston, MD;  Location: [facility];  Service: General;  Laterality: N/A;  . INGUINAL HERNIA REPAIR Left 01/19/2020   Procedure: LEFT INGUINAL HERNIA REPAIR WITH MESH;  Surgeon: Stark Klein, MD;  Location: [facility];  Service: General;  Laterality: Left;  . INSERTION OF MESH Left 01/19/2020   Procedure: INSERTION OF MESH;  Surgeon: Stark Klein, MD;  Location: [facility];  Service:  General;  Laterality: Left;  . LAPAROSCOPIC APPENDECTOMY N/A 01/21/2014   Procedure: APPENDECTOMY LAPAROSCOPIC;  Surgeon: Autumn Messing III, MD;  Location: [facility];  Service: General;  Laterality: N/A;  . LEAD REVISION/REPAIR N/A 06/26/2020   Procedure: LEAD REVISION/REPAIR;  Surgeon: [name], MD;  Location: [facility];  Service: Cardiovascular;  Laterality: N/A;  . PACEMAKER IMPLANT N/A 06/20/2020   Procedure: PACEMAKER IMPLANT;  Surgeon: Evans Lance, MD;  Location: [facility];  Service: Cardiovascular;  Laterality: N/A;  . 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 TRIGGER FINGER RELEASE Left 05/2019     Family History  Problem Relation Age of Onset  . Prostate cancer Brother   . Cancer Paternal Uncle        Unknown what type     Social History   Socioeconomic History  . Marital status: Married    Spouse name: Not on file  . Number of children: Not on file  . Years of education: Not on file  . Highest education level: Not on file  Occupational History  . Not on file  Tobacco Use  . Smoking status: Never Smoker  . Smokeless tobacco: Never Used  Vaping Use  . Vaping Use: Never used  Substance and Sexual Activity  . Alcohol use: Yes    Comment: Occassionally  . Drug use: No  . Sexual activity: Not on file  Other Topics Concern  . Not on file  Social History Narrative  . Not on file   Social Determinants of Health   Financial Resource Strain: Not on file  Food Insecurity: Not on file  Transportation Needs: Not on file  Physical Activity: Not on file  Stress: Not on file  Social Connections: Not on file  Intimate Partner Violence: Not on file     BP 120/82   Pulse 68   Ht 6' (1.829 m)   Wt 160 lb (72.6 kg)   BMI 21.70 kg/m   Physical Exam:  Well appearing NAD HEENT: Unremarkable Neck:  No JVD, no thyromegally Lymphatics:  No adenopathy Back:  No CVA tenderness Lungs:  Clear with no wheezes HEART:  Regular rate rhythm, no murmurs, no rubs, no clicks Abd:  soft, positive bowel sounds, no organomegally, no rebound, no guarding Ext:  2 plus pulses, no edema, no cyanosis, no clubbing Skin:  No rashes no nodules Neuro:  CN II through XII intact, motor grossly intact  EKG - none  DEVICE  Normal device function.  See PaceArt for details.   Assess/Plan: 1. Persistent atrial fib - He has restarted his [place] with no difficulty. We spent a considerable  amount of time discussing his atrial fib. I offered him a DCCV but he is not sure. If he feels worse he is instructed to call and we would schedule DCCV 2. Sinus node dysfunction - he is asymptomatic, s/p PPM insertion 3. Pericardial effusion - resolved by echo with no residual symptoms. 4. Oral anti-coagulation - he has had no bleeding on eliquis.  [name] Gena Laski,MD

## 2020-08-23 ENCOUNTER — Other Ambulatory Visit: Payer: Self-pay

## 2020-08-23 ENCOUNTER — Encounter: Payer: Self-pay | Admitting: Cardiology

## 2020-08-23 ENCOUNTER — Ambulatory Visit: Payer: Medicare HMO | Admitting: Cardiology

## 2020-08-23 VITALS — BP 140/93 | HR 70 | Ht 72.0 in | Wt 164.0 lb

## 2020-08-23 DIAGNOSIS — I10 Essential (primary) hypertension: Secondary | ICD-10-CM | POA: Diagnosis not present

## 2020-08-23 DIAGNOSIS — I4819 Other persistent atrial fibrillation: Secondary | ICD-10-CM | POA: Diagnosis not present

## 2020-08-23 DIAGNOSIS — Z950 Presence of cardiac pacemaker: Secondary | ICD-10-CM | POA: Diagnosis not present

## 2020-08-23 DIAGNOSIS — I495 Sick sinus syndrome: Secondary | ICD-10-CM | POA: Diagnosis not present

## 2020-08-23 DIAGNOSIS — I517 Cardiomegaly: Secondary | ICD-10-CM

## 2020-08-23 LAB — BASIC METABOLIC PANEL
BUN/Creatinine Ratio: 12 (ref 10–24)
BUN: 16 mg/dL (ref 8–27)
CO2: 25 mmol/L (ref 20–29)
Calcium: 9.5 mg/dL (ref 8.6–10.2)
Chloride: 100 mmol/L (ref 96–106)
Creatinine, Ser: 1.32 mg/dL — ABNORMAL HIGH (ref 0.76–1.27)
Glucose: 92 mg/dL (ref 65–99)
Potassium: 4.7 mmol/L (ref 3.5–5.2)
Sodium: 138 mmol/L (ref 134–144)
eGFR: 57 mL/min/{1.73_m2} — ABNORMAL LOW (ref >59)

## 2020-08-23 LAB — CBC
Hematocrit: 42 % (ref 37.5–51.0)
Hemoglobin: 14 g/dL (ref 13.0–17.7)
MCH: 33.3 pg — ABNORMAL HIGH (ref 26.6–33.0)
MCHC: 33.3 g/dL (ref 31.5–35.7)
MCV: 100 fL — ABNORMAL HIGH (ref 79–97)
Platelets: 172 10*3/uL (ref 150–450)
RBC: 4.2 x10E6/uL (ref 4.14–5.80)
RDW: 13.2 % (ref 11.6–15.4)
WBC: 5.1 10*3/uL (ref 3.4–10.8)

## 2020-08-23 MED ORDER — ROSUVASTATIN CALCIUM 10 MG PO TABS: 10.0000 mg | ORAL_TABLET | Freq: Every day | ORAL | 3 refills | Status: DC

## 2020-08-23 MED ORDER — LISINOPRIL 10 MG PO TABS: 10.0000 mg | ORAL_TABLET | Freq: Every day | ORAL | 3 refills | Status: DC

## 2020-08-23 MED ORDER — LISINOPRIL 5 MG PO TABS: 5.0000 mg | ORAL_TABLET | Freq: Every day | ORAL | 3 refills | Status: DC

## 2020-08-23 MED ORDER — METOPROLOL SUCCINATE ER 25 MG PO TB24: 25.0000 mg | ORAL_TABLET | Freq: Every day | ORAL | 3 refills | Status: DC

## 2020-08-23 NOTE — H&P (View-Only) (Signed)
Cardiology Office Note:    Date:  08/24/2020   ID:  William Avila, DOB 10-01-1945, MRN [id number]  PCP:  [name], MD  Cardiologist:  None  Electrophysiologist:  [name], MD   Referring MD: [name], MD   Chief Complaint  Patient presents with  . Atrial Fibrillation    History of Present Illness:    William Avila is a 75 y.o. male with a hx of retroperitoneal liposarcoma, SVT, persistent atrial fibrillation, tachybrady syndrome s/p PPM who presents for follow-up. He was seen in the ED on 05/30/2019 after having a surgical procedure on his arm and having runs of tachycardia following this.Telemetry in the ED showed frequent episodes of narrow complex tachycardia, with rates up to 150s. Appeared to be ectopic P wave during episodes suggesting atrial tachycardia as the cause. He was discharged on metoprolol but did not take.  TTE on 07/06/2019 showed EF 65 to 70%, moderate LVH, grade 1 diastolic dysfunction, normal RV function, moderate dilatation of the ascending aorta measuring 44 mm.  CMR with MRA was recommended to work-up LVH and aortic dilatation.    CMR on 09/14/2019 showed mild asymmetric hypertrophy measuring 13 mm and basal septum not meeting criteria for HCM, no LGE, LV EF 61%, RVEF 63%, dilated ascending aorta measuring 41 mm.  Zio patch x14 days showed 485 episodes of SVT, longest lasting nearly 5 minutes, occasional PVCs (2% of beats).  Reported exertional chest pain, Lexiscan Myoview was done on 11/16/2019 which showed no ischemia, attenuation artifact, LVEF 56%.  Continue to have chest pain underwent coronary CTA on 05/10/2020, which showed calcium score 200 (47 percentile), respiratory motion artifact but no clear obstructive disease.  Zio patch on 05/16/2020 showed 577 episodes of SVT, longest lasting 14 minutes.  Given symptomatic tachybrady syndrome, was referred to EP and underwent PPM placement on 06/20/2020.  Subsequently presented to ED with chest pain on  06/26/2020 found to have lead perforation with large pericardial effusion.  He underwent lead revision on 06/26/2020.  Repeat echo on 07/06/2020 showed normal biventricular function, trivial to small effusion.  Device interrogation showed atrial fibrillation.  He started Eliquis on 3/30 and repeat echo on 4/6 no pericardial effusion.  Today, he is accompanied by his wife, who also provides some history. Since last clinic visit, he reports feeling ok other than occasional fatigue, especially when walking up/downhill. However, he believes his fatigue is improving. He continues to walk and drive mowers/tractors. He will walk at least a mile a day, either a flat trail at the park or walking the dog. He notices no exertional symptoms. He does not regularly check his blood pressure at home but believes it to be stable. Prior to the pacemaker he had lightheadedness. At this time, this has improved but he reports still having minor lightheadedness during certain movements, bending over, or standing upright.  He denies any chest pains, shortness of breath, syncope or palpitations. He remains compliant with the metoprolol.  Denies any hematuria or blood in his stool. Also, within 30 minutes of taking medication in the morning, he begins to feel some fatigue that he attributes to the medicine.    Wt Readings from Last 3 Encounters:  08/23/20 164 lb (74.4 kg)  08/07/20 160 lb (72.6 kg)  07/06/20 163 lb (73.9 kg)     Past Medical History:  Diagnosis Date  . Acute appendicitis 01/21/2014  . Arthritis   . Dysrhythmia    PSVT, bradycardia  . History of hiatal hernia   .  Hypertension     Past Surgical History:  Procedure Laterality Date  . BACK SURGERY     and neck and trigger finger surgery  . CERVICAL SPINE SURGERY    . ESOPHAGOGASTRODUODENOSCOPY (EGD) WITH PROPOFOL Left 12/24/2015   Procedure: ESOPHAGOGASTRODUODENOSCOPY (EGD) WITH PROPOFOL;  Surgeon: Otis Brace, MD;  Location: [place];  Service:  Gastroenterology;  Laterality: Left;  . HIATAL HERNIA REPAIR N/A 12/26/2015   Procedure: LAPAROSCOPIC REPAIR OF HIATAL HERNIA WITH MESH;  Surgeon: Michael Boston, MD;  Location: [place];  Service: General;  Laterality: N/A;  . INGUINAL HERNIA REPAIR Left 01/19/2020   Procedure: LEFT INGUINAL HERNIA REPAIR WITH MESH;  Surgeon: Stark Klein, MD;  Location: [place];  Service: General;  Laterality: Left;  . INSERTION OF MESH Left 01/19/2020   Procedure: INSERTION OF MESH;  Surgeon: Stark Klein, MD;  Location: [place];  Service: General;  Laterality: Left;  . LAPAROSCOPIC APPENDECTOMY N/A 01/21/2014   Procedure: APPENDECTOMY LAPAROSCOPIC;  Surgeon: Autumn Messing III, MD;  Location: [place];  Service: General;  Laterality: N/A;  . LEAD REVISION/REPAIR N/A 06/26/2020   Procedure: LEAD REVISION/REPAIR;  Surgeon: [name], MD;  Location: [place] [facility];  Service: Cardiovascular;  Laterality: N/A;  . PACEMAKER IMPLANT N/A 06/20/2020   Procedure: PACEMAKER IMPLANT;  Surgeon: Evans Lance, MD;  Location: [place] [facility];  Service: Cardiovascular;  Laterality: N/A;  . RESECTION OF RETROPERITONEAL MASS N/A 01/19/2020   Procedure: 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;  Surgeon: Stark Klein, MD;  Location: [place];  Service: General;  Laterality: N/A;  . TRIGGER FINGER RELEASE Left 05/2019    Current Medications: Current Meds  Medication Sig  . acetaminophen (TYLENOL) 500 MG tablet Take 500-1,000 mg by mouth every 6 (six) hours as needed for mild pain.  Marland Kitchen apixaban (ELIQUIS) 5 MG TABS tablet Take 1 tablet (5 mg total) by mouth 2 (two) times daily.  . metoprolol succinate (TOPROL XL) 25 MG 24 hr tablet Take 1 tablet (25 mg total) by mouth daily. (Patient taking differently: Take 25 mg by mouth in the morning.)  . Multiple Vitamins-Minerals (MULTIVITAMIN WITH MINERALS) tablet Take 1 tablet by mouth in the morning.  . ondansetron (ZOFRAN-ODT) 8 MG disintegrating tablet Take 8 mg by mouth every 8  (eight) hours as needed for nausea or vomiting.  . triamcinolone (KENALOG) 0.1 % Apply 1 application topically daily as needed (itching/irritation/rash).  . [DISCONTINUED] hydroxypropyl methylcellulose / hypromellose (ISOPTO TEARS / GONIOVISC) 2.5 % ophthalmic solution Place 1 drop into both eyes as needed for dry eyes.  . [DISCONTINUED] lisinopril (ZESTRIL) 10 MG tablet Take 1 tablet (10 mg total) by mouth daily.  . [DISCONTINUED] metoprolol tartrate (LOPRESSOR) 25 MG tablet Take 1 tablet (25 mg total) by mouth daily.     Allergies:   Codeine, Ibuprofen, Robaxin [methocarbamol], Chlorhexidine, Contrast media [iodinated diagnostic agents], Fentanyl and related, Other, and Tramadol hcl   Social History   Socioeconomic History  . Marital status: Married    Spouse name: Not on file  . Number of children: Not on file  . Years of education: Not on file  . Highest education level: Not on file  Occupational History  . Not on file  Tobacco Use  . Smoking status: Never Smoker  . Smokeless tobacco: Never Used  Vaping Use  . Vaping Use: Never used  Substance and Sexual Activity  . Alcohol use: Yes    Comment: Occassionally  . Drug use: No  . Sexual  activity: Not on file  Other Topics Concern  . Not on file  Social History Narrative  . Not on file   Social Determinants of Health   Financial Resource Strain: Not on file  Food Insecurity: Not on file  Transportation Needs: Not on file  Physical Activity: Not on file  Stress: Not on file  Social Connections: Not on file     Family History: No relevant family history   ROS:   Please see the history of present illness.    (+) Fatigue (+) Lightheadedness All other systems reviewed and are negative.  EKGs/Labs/Other Studies Reviewed:    The following studies were reviewed today:   EKG:   06/23/19: Sinus rhythm, rate 52, no ST/T abnormalities 10/26/19: EKG was not ordered. 01/09/20: Sinus rhythm, rate 53, no ST/T  abnormalities 04/25/20: Sinus rhythm, rate 44, no ST/T abnormalities 05/21/20: sinus rhythm, rate 50, no ST/T abnormalities 08/23/20: V paced, A. fib, rate 70  Recent Labs: 01/22/2020: ALT 78; Magnesium 1.8 06/21/2020: B Natriuretic Peptide 115.6 08/23/2020: BUN 16; Creatinine, Ser 1.32; Hemoglobin 14.0; Platelets 172; Potassium 4.7; Sodium 138  Recent Lipid Panel    Component Value Date/Time   CHOL 161 05/29/2020 0945   TRIG 56 05/29/2020 0945   HDL 74 05/29/2020 0945   CHOLHDL 2.2 05/29/2020 0945   VLDL 11 05/29/2020 0945   LDLCALC 76 05/29/2020 0945     Lexiscan Myoview 06/09/18:  Nuclear stress EF: 62%.  No T wave inversion was noted during stress.  There was no ST segment deviation noted during stress.  This is a low risk study.   No significant reversible ischemia. LVEF 62% with normal wall motion. This is a low risk study.  Physical Exam:    VS:  BP (!) 140/93   Pulse 70   Ht 6' (1.829 m)   Wt 164 lb (74.4 kg)   SpO2 99%   BMI 22.24 kg/m     Wt Readings from Last 3 Encounters:  08/23/20 164 lb (74.4 kg)  08/07/20 160 lb (72.6 kg)  07/06/20 163 lb (73.9 kg)     GEN:  Well nourished, well developed in no acute distress HEENT: Normal NECK: No JVD CARDIAC: RRR, 2/6 systolic heart murmur RESPIRATORY:  Clear to auscultation without rales, wheezing or rhonchi  ABDOMEN: Soft, non-tender, non-distended MUSCULOSKELETAL:  No edema; No deformity  SKIN: Warm and dry NEUROLOGIC:  Alert and oriented x 3 PSYCHIATRIC:  Normal affect   ASSESSMENT:    1. Persistent atrial fibrillation ([place])   2. Tachycardia-bradycardia syndrome ([place])   3. Pacemaker   4. LVH (left ventricular hypertrophy)   5. Essential hypertension    PLAN:    Persistent atrial fibrillation: CHA2DS2-VASc 2 (hypertension, age).  Today in clinic he is in a V paced rhythm with underlying A. fib.  This was confirmed on device interrogation by Dr. Sallyanne Kuster in clinic today, which shows he was not in A. fib  when device was implanted, but has been in A. fib on all subsequent device interrogations.  Suspect developed pericarditis from lead perforation leading to A. Fib. -Continue Eliquis 5 mg twice daily -Continue metoprolol, has been taking Lopressor 25 mg once daily, will switch to Toprol-XL 25 mg daily -Recommend cardioversion to restore sinus rhythm.  He denies any missed doses of Eliquis in the last 3 weeks.    Chest pain: Reported chest pain, Lexiscan Myoview was done on 11/16/2019 which showed no ischemia, attenuation artifact, LVEF 56%.  Continue to have chest pain  so underwent coronary CTA on 05/10/2020, which showed calcium score 200 (47 percentile), respiratory motion artifact but no clear obstructive disease, and with recent normal Lexiscan, no further work-up recommended at this time.  He reports no recent chest pain. -Continue rosuvastatin 10 mg daily  Tachybrady syndrome:Frequent episodes SVT on Zio in March 2021.  Unable to tolerate low dose metoprolol as resting heart rate in 40s to 50s.  Reported lightheadedness with SVT episodes.  Referred to EP, seen by Dr. Lovena Le.  Zio patch x 7 days on 05/16/2020 showed 577 episodes of SVT, longest lasting 14 minutes.  Given symptomatic tachybrady syndrome, underwent PPM placement on 06/20/2020.  Subsequently presented to ED with chest pain on 06/26/2020 found to have lead perforation with large pericardial effusion.  He underwent lead revision on 06/26/2020.  Repeat echo on 07/06/2020 showed normal biventricular function, trivial to small effusion.  Device interrogation showed atrial fibrillation.  He started Eliquis on 3/30 and repeat echo on 4/6 no pericardial effusion. -Has been taking Lopressor 25 mg once daily, will switch to Toprol-XL 25 mg daily -Continue EP follow-up  LVH: CMR on 09/14/2019 showed mild asymmetric hypertrophy measuring 13 mm and basal septum not meeting criteria for HCM, no LGE  Aortic dilatation: dilated ascending aorta measuring 41  mm on MRA 08/2019.  Will repeat MRA in 1 year for monitoring  Hypertension: On lisinopril 15 mg daily.  Was having soft BPs on lisinopril 20 mg and BP is elevated on lisinopril 10 mg.    RTC in 6 months  Medication Adjustments/Labs and Tests Ordered: Current medicines are reviewed at length with the patient today.  Concerns regarding medicines are outlined above.  Orders Placed This Encounter  Procedures  . Basic metabolic panel  . CBC  . EKG 12-Lead   Meds ordered this encounter  Medications  . lisinopril (ZESTRIL) 5 MG tablet    Sig: Take 1 tablet (5 mg total) by mouth daily. In addition to 10 mg tablet=15 mg daily    Dispense:  90 tablet    Refill:  3    + 10 mg tablet=15 mg total daily.  Marland Kitchen lisinopril (ZESTRIL) 10 MG tablet    Sig: Take 1 tablet (10 mg total) by mouth daily.    Dispense:  90 tablet    Refill:  3  . metoprolol succinate (TOPROL XL) 25 MG 24 hr tablet    Sig: Take 1 tablet (25 mg total) by mouth daily.    Dispense:  90 tablet    Refill:  3  . rosuvastatin (CRESTOR) 10 MG tablet    Sig: Take 1 tablet (10 mg total) by mouth daily.    Dispense:  90 tablet    Refill:  3    Patient Instructions  Medication Instructions:  STOP metoprolol tartrate (Lopressor)  START metoprolol succinate (Toprol XL) 25 mg once daily  Lab Work: BMET, CBC today  If you have labs (blood work) drawn today and your tests are completely normal, you will receive your results only by: Marland Kitchen MyChart Message (if you have MyChart) OR . A paper copy in the mail If you have any lab test that is  abnormal or we need to change your treatment, we will call you to review the results.   Testing/Procedures: Your physician has recommended that you have a Cardioversion (DCCV). Electrical Cardioversion uses a jolt of electricity to your heart either through paddles or wired patches attached to your chest. This is a controlled, usually prescheduled, procedure. Defibrillation is  done under light  anesthesia in the hospital, and you usually go home the day of the procedure. This is done to get your heart back into a normal rhythm. You are not awake for the procedure. Please see the instruction sheet given to you today.  Follow-Up: At [facility], you and your health needs are our priority.  As part of our continuing mission to provide you with exceptional heart care, we have created designated Provider Care Teams.  These Care Teams include your primary Cardiologist (physician) and Advanced Practice Providers (APPs -  Physician Assistants and Nurse Practitioners) who all work together to provide you with the care you need, when you need it.  We recommend signing up for the patient portal called "MyChart".  Sign up information is provided on this After Visit Summary.  MyChart is used to connect with patients for Virtual Visits (Telemedicine).  Patients are able to view lab/test results, encounter notes, upcoming appointments, etc.  Non-urgent messages can be sent to your provider as well.   To learn more about what you can do with MyChart, go to [email or web address].    Your next appointment:   3 months with Dr. Gardiner Rhyme  Other Instructions  You are scheduled for a Cardioversion on Wednesday 08/29/20  with Dr. Sallyanne Kuster.  Please arrive at the [facility] (Main Entrance A) at [facility]: [address] [place], [place] [address] at 9 AM.   DIET: Nothing to eat or drink after midnight except a sip of water with medications (see medication instructions below)  Medication Instructions:  Continue your anticoagulant: Eliquis  You will need to continue your anticoagulant after  your procedure until you  are told by your  Provider that it is safe to stop   Labs: Today in office (BMET, CBC)  Covid test to be completed day of procedure at hospital  You must have a responsible person to drive you home and stay in the waiting area during your procedure. Failure to do so could  result in cancellation.  Bring your insurance cards.  *Special Note: Every effort is made to have your procedure done on time. Occasionally there are emergencies that occur at the hospital that may cause delays. Please be patient if a delay does occur.          I,Mathew Stumpf,acting as a Education administrator for Donato Heinz, MD.,have documented all relevant documentation on the behalf of Donato Heinz, MD,as directed by  Donato Heinz, MD while in the presence of Donato Heinz, MD.  I, Donato Heinz, MD, have reviewed all documentation for this visit. The documentation on 08/24/20 for the exam, diagnosis, procedures, and orders are all accurate and complete.   Signed, Donato Heinz, MD  08/24/2020 9:29 AM    [facility]

## 2020-08-23 NOTE — Progress Notes (Deleted)
Cardiology Office Note:    Date:  08/23/2020   ID:  William Avila, DOB [birth year], MRN [id number]  PCP:  [name], MD  Cardiologist:  None  Electrophysiologist:  [name], MD   Referring MD: [name], MD   No chief complaint on file.   History of Present Illness:    William Avila is a 75 y.o. male with a hx of retroperitoneal liposarcoma, SVT, persistent atrial fibrillation, tachybrady syndrome s/p PPM who presents for follow-up. He was seen in the ED on 05/30/2019 after having a surgical procedure on his arm and having runs of tachycardia following this.Telemetry in the ED showed frequent episodes of narrow complex tachycardia, with rates up to 150s. Appeared to be ectopic P wave during episodes suggesting atrial tachycardia as the cause. He was discharged on metoprolol but did not take.  TTE on 07/06/2019 showed EF 65 to 70%, moderate LVH, grade 1 diastolic dysfunction, normal RV function, moderate dilatation of the ascending aorta measuring 44 mm.  CMR with MRA was recommended to work-up LVH and aortic dilatation.    CMR on 09/14/2019 showed mild asymmetric hypertrophy measuring 13 mm and basal septum not meeting criteria for HCM, no LGE, LV EF 61%, RVEF 63%, dilated ascending aorta measuring 41 mm.  Zio patch x14 days showed 485 episodes of SVT, longest lasting nearly 5 minutes, occasional PVCs (2% of beats).  Reported exertional chest pain, Lexiscan Myoview was done on 11/16/2019 which showed no ischemia, attenuation artifact, LVEF 56%.  Continue to have chest pain underwent coronary CTA on 05/10/2020, which showed calcium score 200 (47 percentile), respiratory motion artifact but no clear obstructive disease.  Zio patch on 05/16/2020 showed 577 episodes of SVT, longest lasting 14 minutes.  Given symptomatic tachybrady syndrome, was referred to EP and underwent PPM placement on 06/20/2020.  Subsequently presented to ED with chest pain on 06/26/2020 found to have lead  perforation with large pericardial effusion.  He underwent lead revision on 06/26/2020.  Repeat echo on 07/06/2020 showed normal biventricular function, trivial to small effusion.  Device interrogation showed atrial fibrillation.  He started Eliquis on 3/30 and repeat echo on 4/6 no pericardial effusion.  Since last clinic visit,    he reports that he is doing okay.  States that chest pain has improved, has not had any of the chest pressure that he had earlier this month.  Has had fluttering feeling in the chest.  Also had an episode of lightheadedness, checked his BP was 103/81.  Also had home BP measurements up to 150s.  Denies any syncopal episodes.  Has been having intermittent shortness of breath.  He walks 15 to 20 minutes 3-4 times per day.   Wt Readings from Last 3 Encounters:  08/07/20 160 lb (72.6 kg)  07/06/20 163 lb (73.9 kg)  06/27/20 153 lb 14.1 oz (69.8 kg)     Past Medical History:  Diagnosis Date  . Acute appendicitis 01/21/2014  . Arthritis   . Dysrhythmia    PSVT, bradycardia  . History of hiatal hernia   . Hypertension     Past Surgical History:  Procedure Laterality Date  . BACK SURGERY     and neck and trigger finger surgery  . CERVICAL SPINE SURGERY    . ESOPHAGOGASTRODUODENOSCOPY (EGD) WITH PROPOFOL Left 12/24/2015   Procedure: ESOPHAGOGASTRODUODENOSCOPY (EGD) WITH PROPOFOL;  Surgeon: Otis Brace, MD;  Location: [facility];  Service: Gastroenterology;  Laterality: Left;  . HIATAL HERNIA REPAIR N/A 12/26/2015   Procedure: LAPAROSCOPIC  REPAIR OF HIATAL HERNIA WITH MESH;  Surgeon: Michael Boston, MD;  Location: [place];  Service: General;  Laterality: N/A;  . INGUINAL HERNIA REPAIR Left 01/19/2020   Procedure: LEFT INGUINAL HERNIA REPAIR WITH MESH;  Surgeon: Stark Klein, MD;  Location: [place];  Service: General;  Laterality: Left;  . INSERTION OF MESH Left 01/19/2020   Procedure: INSERTION OF MESH;  Surgeon: Stark Klein, MD;  Location: [place];  Service: General;   Laterality: Left;  . LAPAROSCOPIC APPENDECTOMY N/A 01/21/2014   Procedure: APPENDECTOMY LAPAROSCOPIC;  Surgeon: Autumn Messing III, MD;  Location: [place];  Service: General;  Laterality: N/A;  . LEAD REVISION/REPAIR N/A 06/26/2020   Procedure: LEAD REVISION/REPAIR;  Surgeon: [name], MD;  Location: [facility];  Service: Cardiovascular;  Laterality: N/A;  . PACEMAKER IMPLANT N/A 06/20/2020   Procedure: PACEMAKER IMPLANT;  Surgeon: Evans Lance, MD;  Location: [facility];  Service: Cardiovascular;  Laterality: N/A;  . RESECTION OF RETROPERITONEAL MASS N/A 01/19/2020   Procedure: 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;  Surgeon: Stark Klein, MD;  Location: [place];  Service: General;  Laterality: N/A;  . TRIGGER FINGER RELEASE Left 05/2019    Current Medications: No outpatient medications have been marked as taking for the 08/23/20 encounter (Appointment) with Donato Heinz, MD.     Allergies:   Codeine, Ibuprofen, Robaxin [methocarbamol], Chlorhexidine, Contrast media [iodinated diagnostic agents], Fentanyl and related, Other, and Tramadol hcl   Social History   Socioeconomic History  . Marital status: Married    Spouse name: Not on file  . Number of children: Not on file  . Years of education: Not on file  . Highest education level: Not on file  Occupational History  . Not on file  Tobacco Use  . Smoking status: Never Smoker  . Smokeless tobacco: Never Used  Vaping Use  . Vaping Use: Never used  Substance and Sexual Activity  . Alcohol use: Yes    Comment: Occassionally  . Drug use: No  . Sexual activity: Not on file  Other Topics Concern  . Not on file  Social History Narrative  . Not on file   Social Determinants of Health   Financial Resource Strain: Not on file  Food Insecurity: Not on file  Transportation Needs: Not on file  Physical Activity: Not on file  Stress: Not on file  Social Connections: Not on file     Family  History: No relevant family history   ROS:   Please see the history of present illness.    All other systems reviewed and are negative.  EKGs/Labs/Other Studies Reviewed:    The following studies were reviewed today:   EKG:  EKG is  ordered today.  The ekg ordered today demonstrates V-paced rhythm with underlying Afib (confirmed on device interrogation)  Recent Labs: 01/22/2020: ALT 78; Magnesium 1.8 06/21/2020: B Natriuretic Peptide 115.6 06/26/2020: BUN 16; Creatinine, Ser 1.16; Hemoglobin 12.7; Platelets 177; Potassium 4.2; Sodium 134  Recent Lipid Panel    Component Value Date/Time   CHOL 161 05/29/2020 0945   TRIG 56 05/29/2020 0945   HDL 74 05/29/2020 0945   CHOLHDL 2.2 05/29/2020 0945   VLDL 11 05/29/2020 0945   LDLCALC 76 05/29/2020 0945     Lexiscan Myoview 06/09/18:  Nuclear stress EF: 62%.  No T wave inversion was noted during stress.  There was no ST segment deviation noted during stress.  This is a low risk study.  No significant reversible ischemia. LVEF 62% with normal wall motion. This is a low risk study.  Physical Exam:    VS:  There were no vitals taken for this visit.    Wt Readings from Last 3 Encounters:  08/07/20 160 lb (72.6 kg)  07/06/20 163 lb (73.9 kg)  06/27/20 153 lb 14.1 oz (69.8 kg)     GEN:  Well nourished, well developed in no acute distress HEENT: Normal NECK: No JVD CARDIAC: Bradycardic, regular rhythm, 2/6 systolic heart murmur RESPIRATORY:  Clear to auscultation without rales, wheezing or rhonchi  ABDOMEN: Soft, non-tender, non-distended MUSCULOSKELETAL:  No edema; No deformity  SKIN: Warm and dry NEUROLOGIC:  Alert and oriented x 3 PSYCHIATRIC:  Normal affect   ASSESSMENT:    No diagnosis found. PLAN:    Chest pain: Reported chest pain, Lexiscan Myoview was done on 11/16/2019 which showed no ischemia, attenuation artifact, LVEF 56%.  Continue to have chest pain so underwent coronary CTA on 05/10/2020, which showed  calcium score 200 (47 percentile), respiratory motion artifact but no clear obstructive disease, and with recent normal Lexiscan, no further work-up recommended at this time -Continue rosuvastatin 10 mg daily  Tachybrady syndrome:Frequent episodes SVT on Zio in March 2021.  Unable to tolerate low dose metoprolol as resting heart rate in 40s to 50s.  Does report some lightheadedness with SVT episodes with heart rate up to 140s.  Referred to EP, seen by Dr. Lovena Le.  Zio patch x 7 days on 05/16/2020 showed 577 episodes of SVT, longest lasting 14 minutes.  Given symptomatic tachybrady syndrome, underwent PPM placement on 06/20/2020.  Subsequently presented to ED with chest pain on 06/26/2020 found to have lead perforation with large pericardial effusion.  He underwent lead revision on 06/26/2020.  Repeat echo on 07/06/2020 showed normal biventricular function, trivial to small effusion.  Device interrogation showed atrial fibrillation.  He started Eliquis on 3/30 and repeat echo on 4/6 no pericardial effusion. -Has been taking Lopressor 25 mg once daily, will switch to Toprol-XL 25 mg daily -Continue EP follow-up  Persistent atrial fibrillation: CHA2DS2-VASc 2 (hypertension, age).  Back in sinus rhythm in clinic today -Continue Eliquis 5 mg twice daily -Continue metoprolol, has been taking Lopressor 25 mg once daily, will switch to Toprol-XL 25 mg daily  LVH: CMR on 09/14/2019 showed mild asymmetric hypertrophy measuring 13 mm and basal septum not meeting criteria for HCM, no LGE  Aortic dilatation: dilated ascending aorta measuring 41 mm on MRA 08/2019.  Will repeat MRA in 1 year for monitoring  Hypertension: On lisinopril 15 mg daily.  Was having soft BPs on lisinopril 20 mg and BP is elevated on lisinopril 10 mg.    RTC in 6 months  Medication Adjustments/Labs and Tests Ordered: Current medicines are reviewed at length with the patient today.  Concerns regarding medicines are outlined above.  No orders  of the defined types were placed in this encounter.  No orders of the defined types were placed in this encounter.   There are no Patient Instructions on file for this visit.   Signed, Donato Heinz, MD  08/23/2020 6:14 AM    [place]

## 2020-08-23 NOTE — Patient Instructions (Addendum)
Medication Instructions:  STOP metoprolol tartrate (Lopressor)  START metoprolol succinate (Toprol XL) 25 mg once daily  Lab Work: BMET, CBC today  If you have labs (blood work) drawn today and your tests are completely normal, you will receive your results only by: Marland Kitchen MyChart Message (if you have MyChart) OR . A paper copy in the mail If you have any lab test that is  abnormal or we need to change your treatment, we will call you to review the results.   Testing/Procedures: Your physician has recommended that you have a Cardioversion (DCCV). Electrical Cardioversion uses a jolt of electricity to your heart either through paddles or wired patches attached to your chest. This is a controlled, usually prescheduled, procedure. Defibrillation is done under light anesthesia in the hospital, and you usually go home the day of the procedure. This is done to get your heart back into a normal rhythm. You are not awake for the procedure. Please see the instruction sheet given to you today.  Follow-Up: At [facility], you and your health needs are our priority.  As part of our continuing mission to provide you with exceptional heart care, we have created designated Provider Care Teams.  These Care Teams include your primary Cardiologist (physician) and Advanced Practice Providers (APPs -  Physician Assistants and Nurse Practitioners) who all work together to provide you with the care you need, when you need it.  We recommend signing up for the patient portal called "MyChart".  Sign up information is provided on this After Visit Summary.  MyChart is used to connect with patients for Virtual Visits (Telemedicine).  Patients are able to view lab/test results, encounter notes, upcoming appointments, etc.  Non-urgent messages can be sent to your provider as well.   To learn more about what you can do with MyChart, go to [email or web address].    Your next appointment:   3 months with Dr. Gardiner Rhyme  Other  Instructions  You are scheduled for a Cardioversion on Wednesday 08/29/20  with Dr. Sallyanne Kuster.  Please arrive at the [facility] (Main Entrance A) at [facility]: [address] [place], [place] [address] at 9 AM.   DIET: Nothing to eat or drink after midnight except a sip of water with medications (see medication instructions below)  Medication Instructions:  Continue your anticoagulant: Eliquis  You will need to continue your anticoagulant after  your procedure until you  are told by your  Provider that it is safe to stop   Labs: Today in office (BMET, CBC)  Covid test to be completed day of procedure at hospital  You must have a responsible person to drive you home and stay in the waiting area during your procedure. Failure to do so could result in cancellation.  Bring your insurance cards.  *Special Note: Every effort is made to have your procedure done on time. Occasionally there are emergencies that occur at the hospital that may cause delays. Please be patient if a delay does occur.

## 2020-08-23 NOTE — Progress Notes (Signed)
Cardiology Office Note:    Date:  08/24/2020   ID:  William Avila, DOB 1945/06/06, MRN [id number]  PCP:  William Orn, MD  Cardiologist:  None  Electrophysiologist:  William Peru, MD   Referring MD: William Orn, MD   Chief Complaint  Patient presents with  . Atrial Fibrillation    History of Present Illness:    William Avila is a 75 y.o. male with a hx of retroperitoneal liposarcoma, SVT, persistent atrial fibrillation, tachybrady syndrome s/p PPM who presents for follow-up. He was seen in the ED on 05/30/2019 after having a surgical procedure on his arm and having runs of tachycardia following this.Telemetry in the ED showed frequent episodes of narrow complex tachycardia, with rates up to 150s. Appeared to be ectopic P wave during episodes suggesting atrial tachycardia as the cause. He was discharged on metoprolol but did not take.  TTE on 07/06/2019 showed EF 65 to 70%, moderate LVH, grade 1 diastolic dysfunction, normal RV function, moderate dilatation of the ascending aorta measuring 44 mm.  CMR with MRA was recommended to work-up LVH and aortic dilatation.    CMR on 09/14/2019 showed mild asymmetric hypertrophy measuring 13 mm and basal septum not meeting criteria for HCM, no LGE, LV EF 61%, RVEF 63%, dilated ascending aorta measuring 41 mm.  Zio patch x14 days showed 485 episodes of SVT, longest lasting nearly 5 minutes, occasional PVCs (2% of beats).  Reported exertional chest pain, Lexiscan Myoview was done on 11/16/2019 which showed no ischemia, attenuation artifact, LVEF 56%.  Continue to have chest pain underwent coronary CTA on 05/10/2020, which showed calcium score 200 (47 percentile), respiratory motion artifact but no clear obstructive disease.  Zio patch on 05/16/2020 showed 577 episodes of SVT, longest lasting 14 minutes.  Given symptomatic tachybrady syndrome, was referred to EP and underwent PPM placement on 06/20/2020.  Subsequently presented to ED with chest pain on  06/26/2020 found to have lead perforation with large pericardial effusion.  He underwent lead revision on 06/26/2020.  Repeat echo on 07/06/2020 showed normal biventricular function, trivial to small effusion.  Device interrogation showed atrial fibrillation.  He started Eliquis on 3/30 and repeat echo on 4/6 no pericardial effusion.  Today, he is accompanied by his wife, who also provides some history. Since last clinic visit, he reports feeling ok other than occasional fatigue, especially when walking up/downhill. However, he believes his fatigue is improving. He continues to walk and drive mowers/tractors. He will walk at least a mile a day, either a flat trail at the park or walking the dog. He notices no exertional symptoms. He does not regularly check his blood pressure at home but believes it to be stable. Prior to the pacemaker he had lightheadedness. At this time, this has improved but he reports still having minor lightheadedness during certain movements, bending over, or standing upright.  He denies any chest pains, shortness of breath, syncope or palpitations. He remains compliant with the metoprolol.  Denies any hematuria or blood in his stool. Also, within 30 minutes of taking medication in the morning, he begins to feel some fatigue that he attributes to the medicine.    Wt Readings from Last 3 Encounters:  08/23/20 164 lb (74.4 kg)  08/07/20 160 lb (72.6 kg)  07/06/20 163 lb (73.9 kg)     Past Medical History:  Diagnosis Date  . Acute appendicitis 01/21/2014  . Arthritis   . Dysrhythmia    PSVT, bradycardia  . History of hiatal hernia   .  Hypertension     Past Surgical History:  Procedure Laterality Date  . BACK SURGERY     and neck and trigger finger surgery  . CERVICAL SPINE SURGERY    . ESOPHAGOGASTRODUODENOSCOPY (EGD) WITH PROPOFOL Left 12/24/2015   Procedure: ESOPHAGOGASTRODUODENOSCOPY (EGD) WITH PROPOFOL;  Surgeon: William Brace, MD;  Location: [facility];  Service:  Gastroenterology;  Laterality: Left;  . HIATAL HERNIA REPAIR N/A 12/26/2015   Procedure: LAPAROSCOPIC REPAIR OF HIATAL HERNIA WITH MESH;  Surgeon: William Boston, MD;  Location: [facility];  Service: General;  Laterality: N/A;  . INGUINAL HERNIA REPAIR Left 01/19/2020   Procedure: LEFT INGUINAL HERNIA REPAIR WITH MESH;  Surgeon: William Klein, MD;  Location: [facility];  Service: General;  Laterality: Left;  . INSERTION OF MESH Left 01/19/2020   Procedure: INSERTION OF MESH;  Surgeon: William Klein, MD;  Location: [place] [facility];  Service: General;  Laterality: Left;  . LAPAROSCOPIC APPENDECTOMY N/A 01/21/2014   Procedure: APPENDECTOMY LAPAROSCOPIC;  Surgeon: William Messing III, MD;  Location: [facility];  Service: General;  Laterality: N/A;  . LEAD REVISION/REPAIR N/A 06/26/2020   Procedure: LEAD REVISION/REPAIR;  Surgeon: William Grayer, MD;  Location: [facility];  Service: Cardiovascular;  Laterality: N/A;  . PACEMAKER IMPLANT N/A 06/20/2020   Procedure: PACEMAKER IMPLANT;  Surgeon: William Lance, MD;  Location: [facility];  Service: Cardiovascular;  Laterality: N/A;  . RESECTION OF RETROPERITONEAL MASS N/A 01/19/2020   Procedure: 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;  Surgeon: William Klein, MD;  Location: [facility];  Service: General;  Laterality: N/A;  . TRIGGER FINGER RELEASE Left 05/2019    Current Medications: Current Meds  Medication Sig  . acetaminophen (TYLENOL) 500 MG tablet Take 500-1,000 mg by mouth every 6 (six) hours as needed for mild pain.  Marland Kitchen apixaban (ELIQUIS) 5 MG TABS tablet Take 1 tablet (5 mg total) by mouth 2 (two) times daily.  . metoprolol succinate (TOPROL XL) 25 MG 24 hr tablet Take 1 tablet (25 mg total) by mouth daily. (Patient taking differently: Take 25 mg by mouth in the morning.)  . Multiple Vitamins-Minerals (MULTIVITAMIN WITH MINERALS) tablet Take 1 tablet by mouth in the morning.  . ondansetron (ZOFRAN-ODT) 8 MG disintegrating tablet Take 8 mg by mouth every 8  (eight) hours as needed for nausea or vomiting.  . triamcinolone (KENALOG) 0.1 % Apply 1 application topically daily as needed (itching/irritation/rash).  . [DISCONTINUED] hydroxypropyl methylcellulose / hypromellose (ISOPTO TEARS / GONIOVISC) 2.5 % ophthalmic solution Place 1 drop into both eyes as needed for dry eyes.  . [DISCONTINUED] lisinopril (ZESTRIL) 10 MG tablet Take 1 tablet (10 mg total) by mouth daily.  . [DISCONTINUED] metoprolol tartrate (LOPRESSOR) 25 MG tablet Take 1 tablet (25 mg total) by mouth daily.     Allergies:   Codeine, Ibuprofen, Robaxin [methocarbamol], Chlorhexidine, Contrast media [iodinated diagnostic agents], Fentanyl and related, Other, and Tramadol hcl   Social History   Socioeconomic History  . Marital status: Married    Spouse name: Not on file  . Number of children: Not on file  . Years of education: Not on file  . Highest education level: Not on file  Occupational History  . Not on file  Tobacco Use  . Smoking status: Never Smoker  . Smokeless tobacco: Never Used  Vaping Use  . Vaping Use: Never used  Substance and Sexual Activity  . Alcohol use: Yes    Comment: Occassionally  . Drug use: No  . Sexual  activity: Not on file  Other Topics Concern  . Not on file  Social History Narrative  . Not on file   Social Determinants of Health   Financial Resource Strain: Not on file  Food Insecurity: Not on file  Transportation Needs: Not on file  Physical Activity: Not on file  Stress: Not on file  Social Connections: Not on file     Family History: No relevant family history   ROS:   Please see the history of present illness.    (+) Fatigue (+) Lightheadedness All other systems reviewed and are negative.  EKGs/Labs/Other Studies Reviewed:    The following studies were reviewed today:   EKG:   06/23/19: Sinus rhythm, rate 52, no ST/T abnormalities 10/26/19: EKG was not ordered. 01/09/20: Sinus rhythm, rate 53, no ST/T  abnormalities 04/25/20: Sinus rhythm, rate 44, no ST/T abnormalities 05/21/20: sinus rhythm, rate 50, no ST/T abnormalities 08/23/20: V paced, A. fib, rate 70  Recent Labs: 01/22/2020: ALT 78; Magnesium 1.8 06/21/2020: B Natriuretic Peptide 115.6 08/23/2020: BUN 16; Creatinine, Ser 1.32; Hemoglobin 14.0; Platelets 172; Potassium 4.7; Sodium 138  Recent Lipid Panel    Component Value Date/Time   CHOL 161 05/29/2020 0945   TRIG 56 05/29/2020 0945   HDL 74 05/29/2020 0945   CHOLHDL 2.2 05/29/2020 0945   VLDL 11 05/29/2020 0945   LDLCALC 76 05/29/2020 0945     Lexiscan Myoview 06/09/18:  Nuclear stress EF: 62%.  No T wave inversion was noted during stress.  There was no ST segment deviation noted during stress.  This is a low risk study.   No significant reversible ischemia. LVEF 62% with normal wall motion. This is a low risk study.  Physical Exam:    VS:  BP (!) 140/93   Pulse 70   Ht 6' (1.829 m)   Wt 164 lb (74.4 kg)   SpO2 99%   BMI 22.24 kg/m     Wt Readings from Last 3 Encounters:  08/23/20 164 lb (74.4 kg)  08/07/20 160 lb (72.6 kg)  07/06/20 163 lb (73.9 kg)     GEN:  Well nourished, well developed in no acute distress HEENT: Normal NECK: No JVD CARDIAC: RRR, 2/6 systolic heart murmur RESPIRATORY:  Clear to auscultation without rales, wheezing or rhonchi  ABDOMEN: Soft, non-tender, non-distended MUSCULOSKELETAL:  No edema; No deformity  SKIN: Warm and dry NEUROLOGIC:  Alert and oriented x 3 PSYCHIATRIC:  Normal affect   ASSESSMENT:    1. Persistent atrial fibrillation ([place])   2. Tachycardia-bradycardia syndrome ([place])   3. Pacemaker   4. LVH (left ventricular hypertrophy)   5. Essential hypertension    PLAN:    Persistent atrial fibrillation: CHA2DS2-VASc 2 (hypertension, age).  Today in clinic he is in a V paced rhythm with underlying A. fib.  This was confirmed on device interrogation by Dr. Sallyanne Kuster in clinic today, which shows he was not in A. fib  when device was implanted, but has been in A. fib on all subsequent device interrogations.  Suspect developed pericarditis from lead perforation leading to A. Fib. -Continue Eliquis 5 mg twice daily -Continue metoprolol, has been taking Lopressor 25 mg once daily, will switch to Toprol-XL 25 mg daily -Recommend cardioversion to restore sinus rhythm.  He denies any missed doses of Eliquis in the last 3 weeks.    Chest pain: Reported chest pain, Lexiscan Myoview was done on 11/16/2019 which showed no ischemia, attenuation artifact, LVEF 56%.  Continue to have chest pain  so underwent coronary CTA on 05/10/2020, which showed calcium score 200 (47 percentile), respiratory motion artifact but no clear obstructive disease, and with recent normal Lexiscan, no further work-up recommended at this time.  He reports no recent chest pain. -Continue rosuvastatin 10 mg daily  Tachybrady syndrome:Frequent episodes SVT on Zio in March 2021.  Unable to tolerate low dose metoprolol as resting heart rate in 40s to 50s.  Reported lightheadedness with SVT episodes.  Referred to EP, seen by Dr. Lovena Le.  Zio patch x 7 days on 05/16/2020 showed 577 episodes of SVT, longest lasting 14 minutes.  Given symptomatic tachybrady syndrome, underwent PPM placement on 06/20/2020.  Subsequently presented to ED with chest pain on 06/26/2020 found to have lead perforation with large pericardial effusion.  He underwent lead revision on 06/26/2020.  Repeat echo on 07/06/2020 showed normal biventricular function, trivial to small effusion.  Device interrogation showed atrial fibrillation.  He started Eliquis on 3/30 and repeat echo on 4/6 no pericardial effusion. -Has been taking Lopressor 25 mg once daily, will switch to Toprol-XL 25 mg daily -Continue EP follow-up  LVH: CMR on 09/14/2019 showed mild asymmetric hypertrophy measuring 13 mm and basal septum not meeting criteria for HCM, no LGE  Aortic dilatation: dilated ascending aorta measuring 41  mm on MRA 08/2019.  Will repeat MRA in 1 year for monitoring  Hypertension: On lisinopril 15 mg daily.  Was having soft BPs on lisinopril 20 mg and BP is elevated on lisinopril 10 mg.    RTC in 6 months  Medication Adjustments/Labs and Tests Ordered: Current medicines are reviewed at length with the patient today.  Concerns regarding medicines are outlined above.  Orders Placed This Encounter  Procedures  . Basic metabolic panel  . CBC  . EKG 12-Lead   Meds ordered this encounter  Medications  . lisinopril (ZESTRIL) 5 MG tablet    Sig: Take 1 tablet (5 mg total) by mouth daily. In addition to 10 mg tablet=15 mg daily    Dispense:  90 tablet    Refill:  3    + 10 mg tablet=15 mg total daily.  Marland Kitchen lisinopril (ZESTRIL) 10 MG tablet    Sig: Take 1 tablet (10 mg total) by mouth daily.    Dispense:  90 tablet    Refill:  3  . metoprolol succinate (TOPROL XL) 25 MG 24 hr tablet    Sig: Take 1 tablet (25 mg total) by mouth daily.    Dispense:  90 tablet    Refill:  3  . rosuvastatin (CRESTOR) 10 MG tablet    Sig: Take 1 tablet (10 mg total) by mouth daily.    Dispense:  90 tablet    Refill:  3    Patient Instructions  Medication Instructions:  STOP metoprolol tartrate (Lopressor)  START metoprolol succinate (Toprol XL) 25 mg once daily  Lab Work: BMET, CBC today  If you have labs (blood work) drawn today and your tests are completely normal, you will receive your results only by: Marland Kitchen MyChart Message (if you have MyChart) OR . A paper copy in the mail If you have any lab test that is  abnormal or we need to change your treatment, we will call you to review the results.   Testing/Procedures: Your physician has recommended that you have a Cardioversion (DCCV). Electrical Cardioversion uses a jolt of electricity to your heart either through paddles or wired patches attached to your chest. This is a controlled, usually prescheduled, procedure. Defibrillation is  done under light  anesthesia in the hospital, and you usually go home the day of the procedure. This is done to get your heart back into a normal rhythm. You are not awake for the procedure. Please see the instruction sheet given to you today.  Follow-Up: At [facility], you and your health needs are our priority.  As part of our continuing mission to provide you with exceptional heart care, we have created designated Provider Care Teams.  These Care Teams include your primary Cardiologist (physician) and Advanced Practice Providers (APPs -  Physician Assistants and Nurse Practitioners) who all work together to provide you with the care you need, when you need it.  We recommend signing up for the patient portal called "MyChart".  Sign up information is provided on this After Visit Summary.  MyChart is used to connect with patients for Virtual Visits (Telemedicine).  Patients are able to view lab/test results, encounter notes, upcoming appointments, etc.  Non-urgent messages can be sent to your provider as well.   To learn more about what you can do with MyChart, go to [email or web address].    Your next appointment:   3 months with Dr. Gardiner Rhyme  Other Instructions  You are scheduled for a Cardioversion on Wednesday 08/29/20  with Dr. Sallyanne Kuster.  Please arrive at the [facility] (Main Entrance A) at [facility]: [address] [place], [place] [address] at 9 AM.   DIET: Nothing to eat or drink after midnight except a sip of water with medications (see medication instructions below)  Medication Instructions:  Continue your anticoagulant: Eliquis  You will need to continue your anticoagulant after  your procedure until you  are told by your  Provider that it is safe to stop   Labs: Today in office (BMET, CBC)  Covid test to be completed day of procedure at hospital  You must have a responsible person to drive you home and stay in the waiting area during your procedure. Failure to do so could  result in cancellation.  Bring your insurance cards.  *Special Note: Every effort is made to have your procedure done on time. Occasionally there are emergencies that occur at the hospital that may cause delays. Please be patient if a delay does occur.          I,Mathew Stumpf,acting as a Education administrator for Donato Heinz, MD.,have documented all relevant documentation on the behalf of Donato Heinz, MD,as directed by  Donato Heinz, MD while in the presence of Donato Heinz, MD.  I, Donato Heinz, MD, have reviewed all documentation for this visit. The documentation on 08/24/20 for the exam, diagnosis, procedures, and orders are all accurate and complete.   Signed, Donato Heinz, MD  08/24/2020 9:29 AM    [facility]

## 2020-08-27 ENCOUNTER — Telehealth: Payer: Self-pay | Admitting: Cardiology

## 2020-08-27 DIAGNOSIS — I77819 Aortic ectasia, unspecified site: Secondary | ICD-10-CM

## 2020-08-27 NOTE — Telephone Encounter (Signed)
This RN called patient at [phone], told patient the reason he is having another MRA is that Dr. Gardiner Rhyme recommended rechecking MRA in 1 year last year due to enlargement in the aorta:   Donato Heinz, MD  09/15/2019 10:41 PM EDT      Mild thickening of heart muscle, likely due to high blood pressure, as no evidence of other causes of heart muscle thickening (hypertrophic cardiomyopathy or amyloidosis). Normal heart pumping function. Aorta is mildly enlarged. Recommend MRA aorta in 1 year to monitor.   Patient verbalized understanding, however pt also stated he had imaging at [facility] 3/29 and wanted to make sure Dr. Gardiner Rhyme was aware before he had another scan. This RN let patient know his message would be sent to Dr. Gardiner Rhyme for review. Pt verbalized understanding, all concerns addressed at this time.

## 2020-08-27 NOTE — Telephone Encounter (Signed)
Left message for patient to call and discuss scheduling the MRA chest ordered by Dr. Gardiner Rhyme

## 2020-08-27 NOTE — Telephone Encounter (Signed)
Patient returned my call to discuss scheduling the MRA chest ordered by Dr. Idamae Lusher states he is unaware of this test and wants to know why he needs it

## 2020-08-28 ENCOUNTER — Other Ambulatory Visit: Payer: Self-pay | Admitting: *Deleted

## 2020-08-28 NOTE — Telephone Encounter (Signed)
Yes we can hold off for now, plan repeat MRA in 1 year

## 2020-08-28 NOTE — Telephone Encounter (Signed)
Patient aware and verbalized understanding. New order placed for repeat in 1 year

## 2020-08-29 ENCOUNTER — Other Ambulatory Visit: Payer: Self-pay

## 2020-08-29 ENCOUNTER — Encounter (HOSPITAL_COMMUNITY): Payer: Self-pay | Admitting: Cardiovascular Disease

## 2020-08-29 ENCOUNTER — Encounter (HOSPITAL_COMMUNITY)
Admission: RE | Disposition: A | Discharge status: Home / Self Care | Payer: Self-pay | Source: Home / Self Care | Attending: Cardiovascular Disease

## 2020-08-29 ENCOUNTER — Ambulatory Visit (HOSPITAL_COMMUNITY): Payer: Medicare HMO | Admitting: Certified Registered"

## 2020-08-29 ENCOUNTER — Ambulatory Visit (HOSPITAL_COMMUNITY)
Admission: RE | Admit: 2020-08-29 | Discharge: 2020-08-29 | Disposition: A | Discharge status: Home / Self Care | Payer: Medicare HMO | Attending: Cardiovascular Disease | Admitting: Cardiovascular Disease

## 2020-08-29 DIAGNOSIS — Z79899 Other long term (current) drug therapy: Secondary | ICD-10-CM | POA: Insufficient documentation

## 2020-08-29 DIAGNOSIS — I495 Sick sinus syndrome: Secondary | ICD-10-CM | POA: Insufficient documentation

## 2020-08-29 DIAGNOSIS — Z7901 Long term (current) use of anticoagulants: Secondary | ICD-10-CM | POA: Diagnosis not present

## 2020-08-29 DIAGNOSIS — Z20822 Contact with and (suspected) exposure to covid-19: Secondary | ICD-10-CM | POA: Insufficient documentation

## 2020-08-29 DIAGNOSIS — I4819 Other persistent atrial fibrillation: Secondary | ICD-10-CM | POA: Diagnosis not present

## 2020-08-29 DIAGNOSIS — K449 Diaphragmatic hernia without obstruction or gangrene: Secondary | ICD-10-CM | POA: Diagnosis not present

## 2020-08-29 DIAGNOSIS — Z950 Presence of cardiac pacemaker: Secondary | ICD-10-CM | POA: Insufficient documentation

## 2020-08-29 DIAGNOSIS — E440 Moderate protein-calorie malnutrition: Secondary | ICD-10-CM | POA: Diagnosis not present

## 2020-08-29 DIAGNOSIS — Z91041 Radiographic dye allergy status: Secondary | ICD-10-CM | POA: Diagnosis not present

## 2020-08-29 DIAGNOSIS — Z885 Allergy status to narcotic agent status: Secondary | ICD-10-CM | POA: Insufficient documentation

## 2020-08-29 DIAGNOSIS — I4891 Unspecified atrial fibrillation: Secondary | ICD-10-CM | POA: Diagnosis not present

## 2020-08-29 DIAGNOSIS — I10 Essential (primary) hypertension: Secondary | ICD-10-CM | POA: Diagnosis not present

## 2020-08-29 HISTORY — PX: CARDIOVERSION: SHX1299

## 2020-08-29 LAB — SARS CORONAVIRUS 2 (TAT 6-24 HRS): SARS Coronavirus 2: NEGATIVE

## 2020-08-29 SURGERY — CARDIOVERSION
Anesthesia: General

## 2020-08-29 MED ORDER — LIDOCAINE 2% (20 MG/ML) 5 ML SYRINGE
INTRAMUSCULAR | Status: DC | PRN
  Administered 2020-08-29: 80 mg via INTRAVENOUS

## 2020-08-29 MED ORDER — PROPOFOL 10 MG/ML IV BOLUS
INTRAVENOUS | Status: DC | PRN
  Administered 2020-08-29: 90 mg via INTRAVENOUS

## 2020-08-29 MED ORDER — SODIUM CHLORIDE 0.9 % IV SOLN: INTRAVENOUS | Status: DC | PRN

## 2020-08-29 NOTE — Interval H&P Note (Signed)
History and Physical Interval Note:  08/29/2020 9:32 AM  William Avila  has presented today for surgery, with the diagnosis of afib.  The various methods of treatment have been discussed with the patient and family. After consideration of risks, benefits and other options for treatment, the patient has consented to  Procedure(s): CARDIOVERSION (N/A) as a surgical intervention.  The patient's history has been reviewed, patient examined, no change in status, stable for surgery.  I have reviewed the patient's chart and labs.  Questions were answered to the patient's satisfaction.     [name]

## 2020-08-29 NOTE — Anesthesia Preprocedure Evaluation (Signed)
Anesthesia Evaluation  Patient identified by MRN, date of birth, ID band Patient awake    Reviewed: Allergy & Precautions, NPO status , Patient's Chart, lab work & pertinent test results  Airway Mallampati: I  TM Distance: >3 FB Neck ROM: Full    Dental  (+) Poor Dentition, Caps, Dental Advisory Given, Missing   Pulmonary neg pulmonary ROS,    Pulmonary exam normal breath sounds clear to auscultation       Cardiovascular hypertension, Pt. on medications Normal cardiovascular exam+ dysrhythmias Supra Ventricular Tachycardia  Rhythm:Regular Rate:Normal     Neuro/Psych negative neurological ROS  negative psych ROS   GI/Hepatic Neg liver ROS, hiatal hernia,   Endo/Other  negative endocrine ROS  Renal/GU negative Renal ROS  negative genitourinary   Musculoskeletal  (+) Arthritis , Osteoarthritis,  Right retroperitoneal mass Left inguinal hernia   Abdominal   Peds  Hematology negative hematology ROS (+)   Anesthesia Other Findings   Reproductive/Obstetrics                             Anesthesia Physical  Anesthesia Plan  ASA: III  Anesthesia Plan: General   Post-op Pain Management:    Induction: Intravenous  PONV Risk Score and Plan: 4 or greater and Ondansetron and Treatment may vary due to age or medical condition  Airway Management Planned: Nasal Cannula and Natural Airway  Additional Equipment: None  Intra-op Plan:   Post-operative Plan: Extubation in OR  Informed Consent: I have reviewed the patients History and Physical, chart, labs and discussed the procedure including the risks, benefits and alternatives for the proposed anesthesia with the patient or authorized representative who has indicated his/her understanding and acceptance.     Dental advisory given  Plan Discussed with: CRNA and Anesthesiologist  Anesthesia Plan Comments: (PAT note written 01/17/2020 by  Myra Gianotti, PA-C. )        Anesthesia Quick Evaluation

## 2020-08-29 NOTE — Transfer of Care (Signed)
Immediate Anesthesia Transfer of Care Note  Patient: William Avila  Procedure(s) Performed: CARDIOVERSION (N/A )  Patient Location: Endoscopy Unit  Anesthesia Type:General  Level of Consciousness: drowsy and patient cooperative  Airway & Oxygen Therapy: Patient Spontanous Breathing  Post-op Assessment: Report given to RN  Post vital signs: Reviewed and stable  Last Vitals:  Vitals Value Taken Time  BP 129/88 08/29/20 1252  Temp    Pulse 61 08/29/20 1252  Resp 17 08/29/20 1252  SpO2 99 % 08/29/20 1252    Last Pain:  Vitals:   08/29/20 1215  TempSrc: Axillary  PainSc: 0-No pain         Complications: No complications documented.

## 2020-08-29 NOTE — Anesthesia Postprocedure Evaluation (Signed)
Anesthesia Post Note  Patient: William Avila  Procedure(s) Performed: CARDIOVERSION (N/A )     Patient location during evaluation: PACU Anesthesia Type: General Level of consciousness: awake and alert Pain management: pain level controlled Vital Signs Assessment: post-procedure vital signs reviewed and stable Respiratory status: spontaneous breathing, nonlabored ventilation, respiratory function stable and patient connected to nasal cannula oxygen Cardiovascular status: blood pressure returned to baseline and stable Postop Assessment: no apparent nausea or vomiting Anesthetic complications: no   No complications documented.  Last Vitals:  Vitals:   08/29/20 1305 08/29/20 1315  BP: 104/82 (!) 153/91  Pulse: (!) 108 62  Resp: 15 15  Temp:    SpO2: 98% 99%    Last Pain:  Vitals:   08/29/20 1315  TempSrc:   PainSc: 0-No pain                 Gisell Buehrle

## 2020-08-29 NOTE — Op Note (Signed)
Procedure: Electrical Cardioversion Indications:  Atrial Fibrillation  Procedure Details:  Consent: Risks of procedure as well as the alternatives and risks of each were explained to the (patient/caregiver).  Consent for procedure obtained.  Time Out: Verified patient identification, verified procedure, site/side was marked, verified correct patient position, special equipment/implants available, medications/allergies/relevent history reviewed, required imaging and test results available.  Performed  Patient placed on cardiac monitor, pulse oximetry, supplemental oxygen as necessary.  Sedation given: propofol IV Dr. Candis Shine Pacer pads placed anterior and posterior chest.  Cardioverted 2 time(s).  Cardioversion with synchronized biphasic 120J shock, unsuccessful, then 200J (successful).  Evaluation: Findings: Post procedure EKG shows: Sinus rhythm with frequent PACs and atrial couplets Complications: None Patient did tolerate procedure well.  Pacemaker interrogation was performed before and after the cardioversion.  During normal rhythm, atrial sensing was excellent with P waves of 4.0 mV, but during atrial fibrillation there was intermittent under sensing of the P wave.  Atrial sensitivity was turned down to 0.3 mV.  Frequent ventricular pacing was seen and AV delays were extended to sensed 275 ms/paced 300 ms.  This lessened the frequency of ventricular pacing, although this often occurred during the second beat of atrial couplets.  PMT occurred and was resolved with adjustment of PVARP. Normal pacemaker function otherwise.  Time Spent Directly with the Patient:  45 minutes   William Avila 08/29/2020, 1:32 PM

## 2020-08-30 ENCOUNTER — Encounter (HOSPITAL_COMMUNITY): Payer: Self-pay | Admitting: Cardiovascular Disease

## 2020-09-03 ENCOUNTER — Other Ambulatory Visit: Payer: Self-pay | Admitting: General Surgery

## 2020-09-03 DIAGNOSIS — N6310 Unspecified lump in the right breast, unspecified quadrant: Secondary | ICD-10-CM | POA: Diagnosis not present

## 2020-09-03 DIAGNOSIS — R195 Other fecal abnormalities: Secondary | ICD-10-CM | POA: Diagnosis not present

## 2020-09-03 DIAGNOSIS — C480 Malignant neoplasm of retroperitoneum: Secondary | ICD-10-CM | POA: Diagnosis not present

## 2020-09-16 IMAGING — CT CT ABD-PELV W/O CM
1 of 2 series · 13 of 32 positions shown, 18 images · non-contrast
Comparison: CT abdomen pelvis dated December 22, 2015.

CLINICAL DATA: Left lower quadrant and groin pain.

EXAM:
CT ABDOMEN AND PELVIS WITHOUT CONTRAST
TECHNIQUE: Multidetector CT imaging of the abdomen and pelvis was performed
following the standard protocol without IV contrast.

[Series 2: abd/pelvis w/(date) · axial · 0.86mm/px · z∈[+339,+759]mm · 13 of 96 slices shown, 18 images]
[im 6/96  soft-tissue]
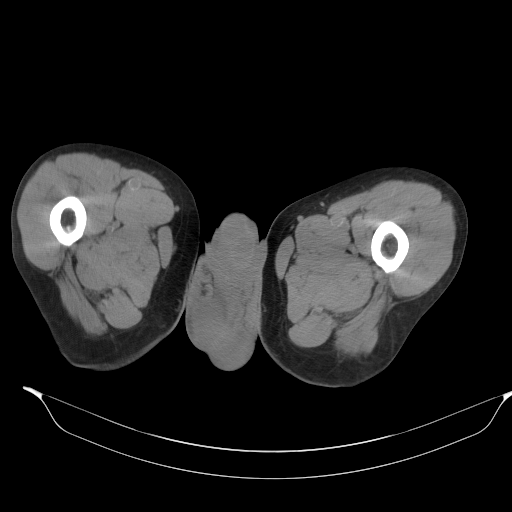
[im 6/96  bone]
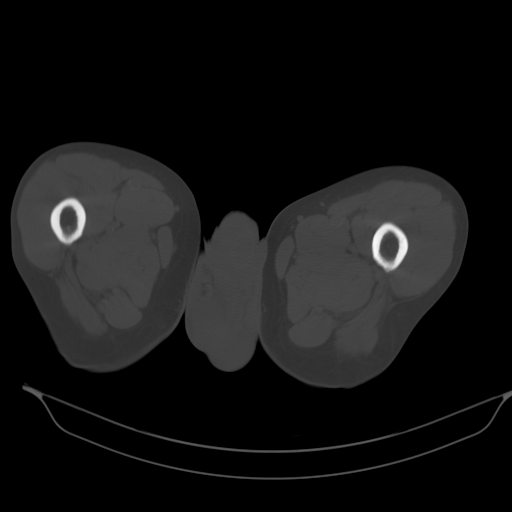
[im 17/96  soft-tissue]
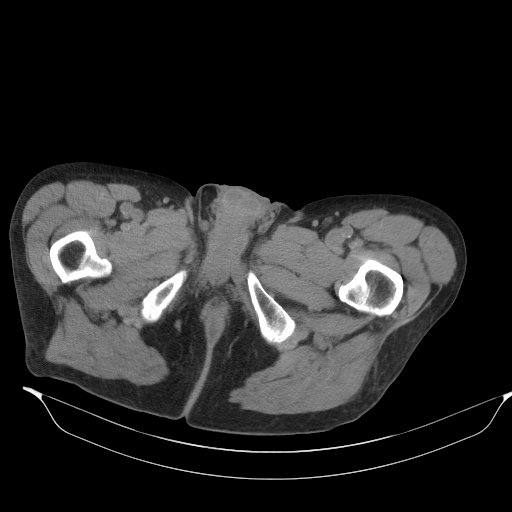
[im 23/96  soft-tissue]
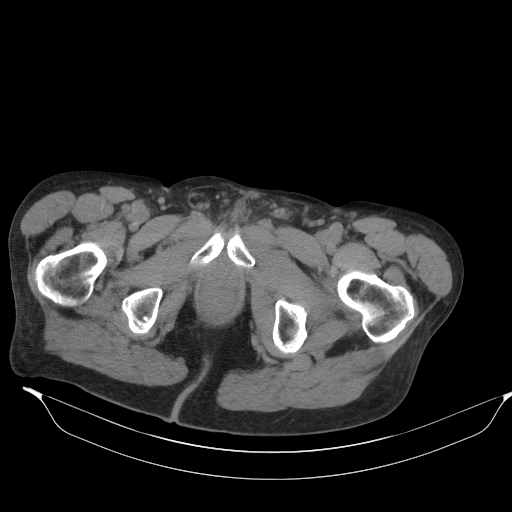
[im 28/96  soft-tissue]
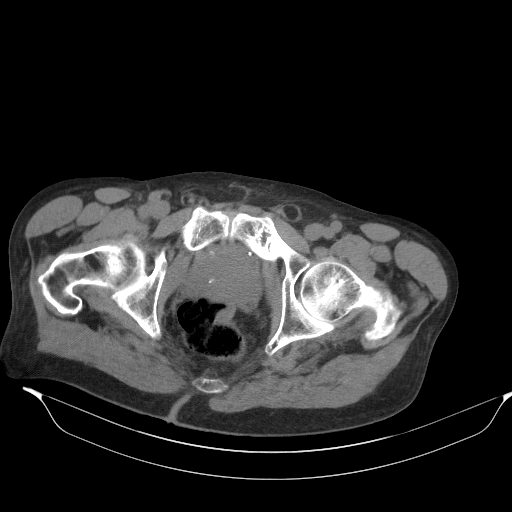
[im 40/96  soft-tissue]
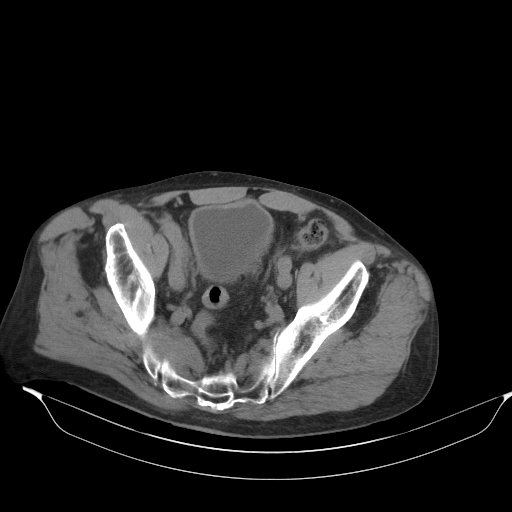
[im 45/96  soft-tissue]
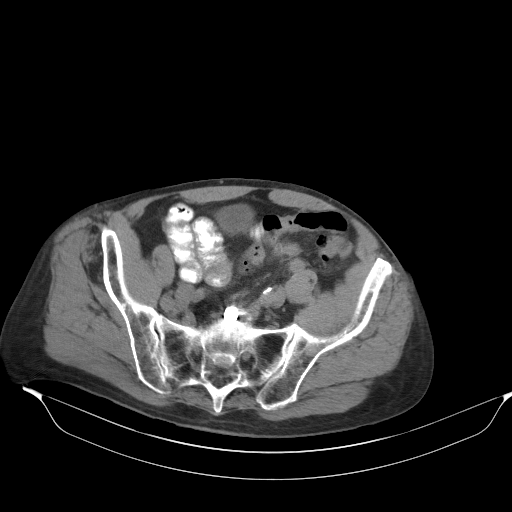
[im 51/96  soft-tissue]
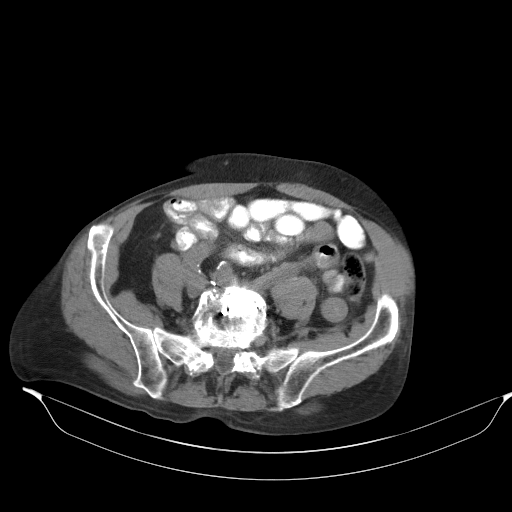
[im 62/96  soft-tissue]
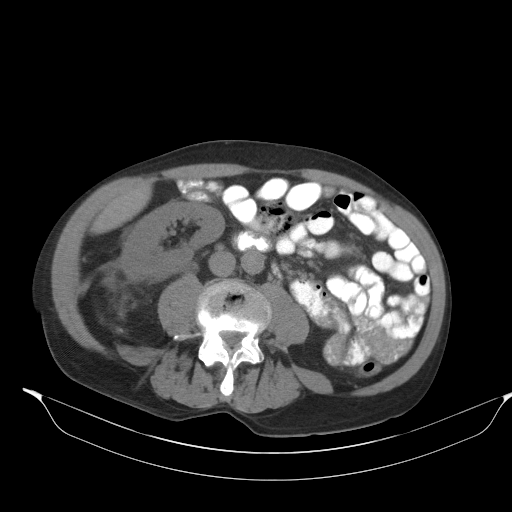
[im 68/96  soft-tissue]
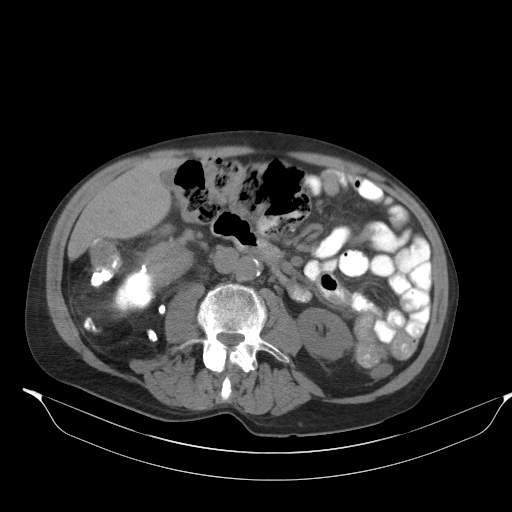
[im 68/96  bone]
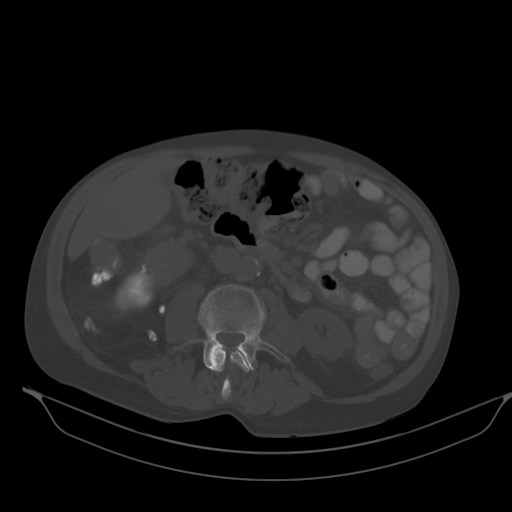
[im 73/96  soft-tissue]
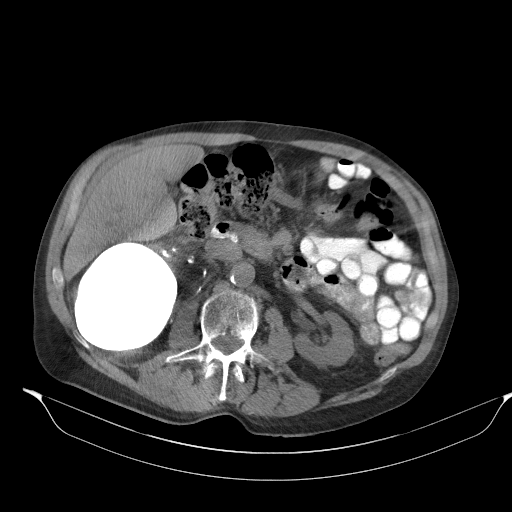
[im 73/96  lung]
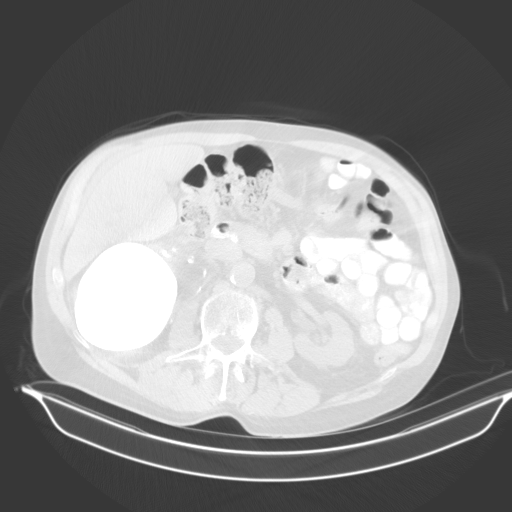
[im 79/96  lung]
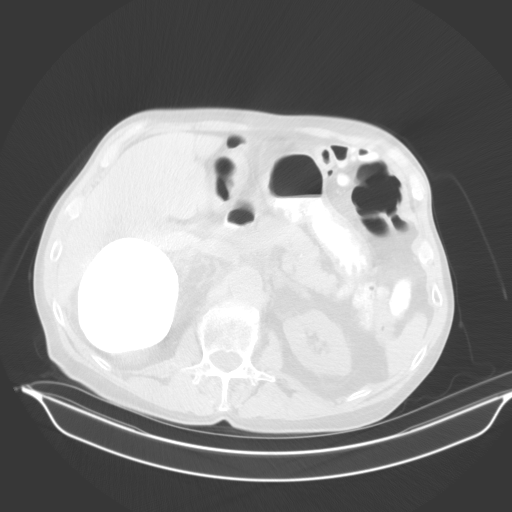
[im 84/96  soft-tissue]
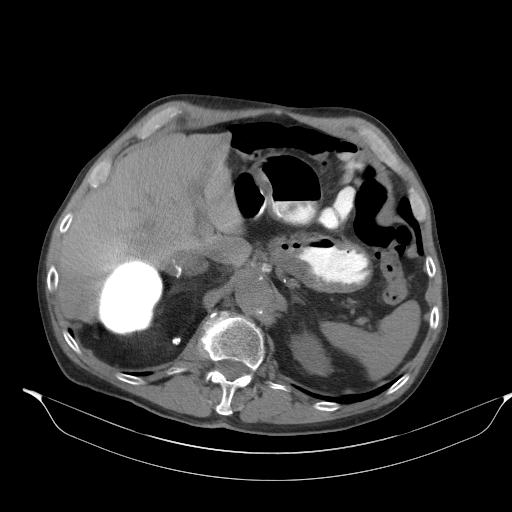
[im 84/96  lung]
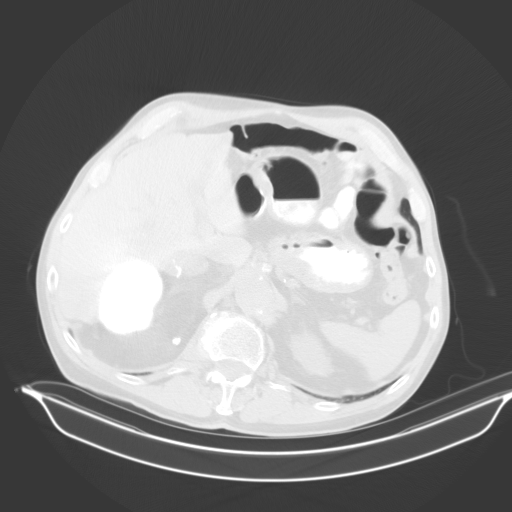
[im 90/96  soft-tissue]
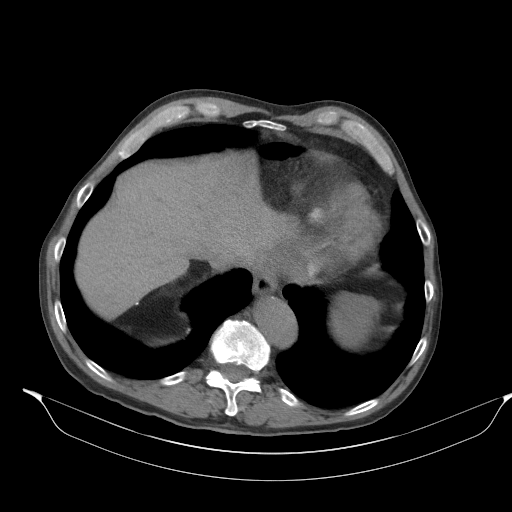
[im 90/96  lung]
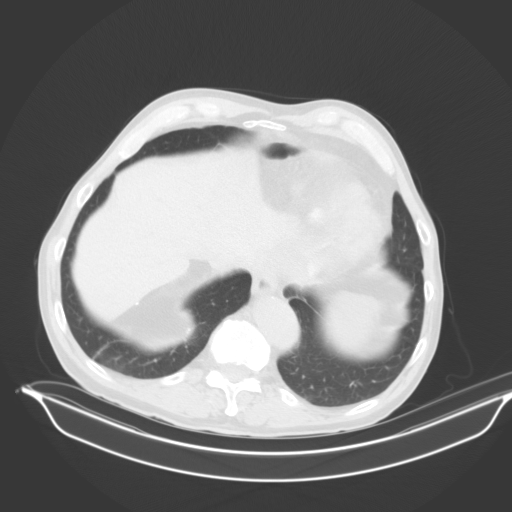

[13 of 32 positions shown; findings below may reference images not displayed]

FINDINGS: Lower chest: No acute abnormality.

Hepatobiliary: No focal liver abnormality is seen. No gallstones,
gallbladder wall thickening, or biliary dilatation.

Pancreas: Unremarkable. No pancreatic ductal dilatation or
surrounding inflammatory changes.

Spleen: Normal in size without focal abnormality.

Adrenals/Urinary Tract: The adrenal glands are unremarkable. The
x 8.8 cm retroperitoneal calcification superior to the right kidney
is unchanged in size, but is more densely calcified in the interim.
There is new 3.3 x 2.3 cm lobulated intermediate density lateral to
the right kidney, with increasing fat density containing
calcification and stranding posterior to the right kidney measuring
approximately 11.0 x 11.1 cm. There is greater anterior displacement
of the right kidney when compared to the prior study. The left
kidney is unremarkable. No hydronephrosis. Unchanged mild
circumferential bladder wall thickening.

Stomach/Bowel: Interval hiatal hernia repair. No bowel wall
thickening, distention, or surrounding inflammatory changes. Prior
appendectomy. Mild colonic diverticulosis.

Vascular/Lymphatic: Aortic atherosclerosis. No enlarged abdominal or
pelvic lymph nodes.

Reproductive: Unchanged prostatomegaly.  Small bilateral hydroceles.

Other: New small fat containing left inguinal hernia. Unchanged
small fat containing right inguinal hernia. No free fluid or
pneumoperitoneum.

Musculoskeletal: No acute or significant osseous findings. Chronic
L1 and L2 compression deformities. Prior L4-S1 anterior fusion.
IMPRESSION: 1. New 11.0 x 11.1 cm fat density mass containing calcification,
stranding, and nodularity posterior to the right kidney with greater
anterior displacement of the right kidney when compared to the prior
study. Findings are concerning for retroperitoneal liposarcoma.
2. Unchanged size of the 9.8 x 8.8 cm retroperitoneal calcification
superior to the right kidney, which is more densely calcified in the
interim.
3. New small fat containing left inguinal hernia.
4. Aortic Atherosclerosis ([id number]-FWA.A).

## 2020-09-20 ENCOUNTER — Ambulatory Visit (INDEPENDENT_AMBULATORY_CARE_PROVIDER_SITE_OTHER): Payer: Medicare HMO

## 2020-09-20 DIAGNOSIS — I495 Sick sinus syndrome: Secondary | ICD-10-CM | POA: Diagnosis not present

## 2020-09-20 LAB — CUP PACEART REMOTE DEVICE CHECK
Battery Remaining Longevity: 83 mo
Battery Remaining Percentage: 95.5 %
Battery Voltage: 2.99 V
Brady Statistic AP VP Percent: 1.8 %
Brady Statistic AP VS Percent: 60 %
Brady Statistic AS VP Percent: 1.3 %
Brady Statistic AS VS Percent: 32 %
Brady Statistic RA Percent Paced: 54 %
Brady Statistic RV Percent Paced: 3 %
Date Time Interrogation Session: 20220602020012
Implantable Lead Implant Date: 20220302
Implantable Lead Implant Date: 20220302
Implantable Lead Location: 753859
Implantable Lead Location: 753860
Implantable Pulse Generator Implant Date: 20220302
Lead Channel Impedance Value: 410 Ohm
Lead Channel Impedance Value: 440 Ohm
Lead Channel Pacing Threshold Amplitude: 0.5 V
Lead Channel Pacing Threshold Amplitude: 1 V
Lead Channel Pacing Threshold Pulse Width: 0.4 ms
Lead Channel Pacing Threshold Pulse Width: 0.5 ms
Lead Channel Sensing Intrinsic Amplitude: 11.7 mV
Lead Channel Sensing Intrinsic Amplitude: 5 mV
Lead Channel Setting Pacing Amplitude: 3.5 V
Lead Channel Setting Pacing Amplitude: 3.5 V
Lead Channel Setting Pacing Pulse Width: 0.5 ms
Lead Channel Setting Sensing Sensitivity: 2 mV
Pulse Gen Model: 2272
Pulse Gen Serial Number: 6376316

## 2020-09-24 ENCOUNTER — Ambulatory Visit
Admission: RE | Admit: 2020-09-24 | Discharge: 2020-09-24 | Disposition: A | Discharge status: Home / Self Care | Payer: Medicare HMO | Source: Ambulatory Visit | Attending: General Surgery | Admitting: General Surgery

## 2020-09-24 ENCOUNTER — Other Ambulatory Visit: Payer: Self-pay

## 2020-09-24 ENCOUNTER — Ambulatory Visit: Payer: Medicare HMO

## 2020-09-24 DIAGNOSIS — N6310 Unspecified lump in the right breast, unspecified quadrant: Secondary | ICD-10-CM

## 2020-09-24 DIAGNOSIS — N62 Hypertrophy of breast: Secondary | ICD-10-CM | POA: Diagnosis not present

## 2020-10-09 ENCOUNTER — Other Ambulatory Visit: Payer: Self-pay

## 2020-10-09 ENCOUNTER — Encounter: Payer: Self-pay | Admitting: Internal Medicine

## 2020-10-09 ENCOUNTER — Ambulatory Visit: Payer: Medicare HMO | Admitting: Internal Medicine

## 2020-10-09 VITALS — BP 126/66 | HR 50 | Ht 72.0 in | Wt 163.0 lb

## 2020-10-09 DIAGNOSIS — I495 Sick sinus syndrome: Secondary | ICD-10-CM | POA: Diagnosis not present

## 2020-10-09 DIAGNOSIS — I10 Essential (primary) hypertension: Secondary | ICD-10-CM

## 2020-10-09 DIAGNOSIS — Z950 Presence of cardiac pacemaker: Secondary | ICD-10-CM

## 2020-10-09 DIAGNOSIS — I4819 Other persistent atrial fibrillation: Secondary | ICD-10-CM | POA: Diagnosis not present

## 2020-10-09 DIAGNOSIS — H35033 Hypertensive retinopathy, bilateral: Secondary | ICD-10-CM | POA: Diagnosis not present

## 2020-10-09 NOTE — Patient Instructions (Signed)
Medication Instructions:  Your physician recommends that you continue on your current medications as directed. Please refer to the Current Medication list given to you today.  *If you need a refill on your cardiac medications before your next appointment, please call your pharmacy*   Lab Work: NONE   If you have labs (blood work) drawn today and your tests are completely normal, you will receive your results only by: . MyChart Message (if you have MyChart) OR . A paper copy in the mail If you have any lab test that is abnormal or we need to change your treatment, we will call you to review the results.   Testing/Procedures: NONE    Follow-Up: At [facility], you and your health needs are our priority.  As part of our continuing mission to provide you with exceptional heart care, we have created designated Provider Care Teams.  These Care Teams include your primary Cardiologist (physician) and Advanced Practice Providers (APPs -  Physician Assistants and Nurse Practitioners) who all work together to provide you with the care you need, when you need it.  We recommend signing up for the patient portal called "MyChart".  Sign up information is provided on this After Visit Summary.  MyChart is used to connect with patients for Virtual Visits (Telemedicine).  Patients are able to view lab/test results, encounter notes, upcoming appointments, etc.  Non-urgent messages can be sent to your provider as well.   To learn more about what you can do with MyChart, go to [email or web address].    Your next appointment:   1 year(s)  The format for your next appointment:   In Person  Provider:   Gregg Taylor, MD   Other Instructions Thank you for choosing [facility]!    

## 2020-10-09 NOTE — Progress Notes (Signed)
HPI [name] returns today for followup. He is a pleasant 75 yo man with a h/o bradycardia who underwent PPM insertion complicated by a late chest pain/pericardial effusion s/p lead revision. Repeat echo demonstrated no residual effusion. He has a h/o atrial fib. He denies chest pain though he notes some dyspnea and fatigue when he walks up an incline. No palpitations. 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  Allergies  Allergen Reactions   Codeine Anaphylaxis   Ibuprofen Anaphylaxis   Robaxin [Methocarbamol] Anaphylaxis, Hives, Swelling and Rash   Chlorhexidine Other (See Comments)    Unknown reaction   Contrast Media [Iodinated Diagnostic Agents] Itching and Other (See Comments)    Itching and bumps on skin.   Fentanyl And Related Other (See Comments)    Dry heaves (lasted for 14 hours)   Other Other (See Comments)    Nerve Block Tray: causes nausea/dry heaves   Tramadol Hcl Other (See Comments)    Changes personality, makes him angry     Current Outpatient Medications  Medication Sig Dispense Refill   acetaminophen (TYLENOL) 500 MG tablet Take 500-1,000 mg by mouth every 6 (six) hours as needed for mild pain.     apixaban (ELIQUIS) 5 MG TABS tablet Take 1 tablet (5 mg total) by mouth 2 (two) times daily. 60 tablet 3   lisinopril (ZESTRIL) 10 MG tablet Take 15 mg by mouth daily.     metoprolol succinate (TOPROL XL) 25 MG 24 hr tablet Take 1 tablet (25 mg total) by mouth daily. 90 tablet 3   Multiple Vitamins-Minerals (MULTIVITAMIN WITH MINERALS) tablet Take 1 tablet by mouth in the morning.     ondansetron (ZOFRAN-ODT) 8 MG disintegrating tablet Take 8 mg by mouth every 8 (eight) hours as needed for nausea or vomiting.     Polyethyl Glycol-Propyl Glycol (SYSTANE) 0.4-0.3 % SOLN Place 1-2 drops into both eyes 3 (three) times daily as needed (dry/irritated eyes.).     rosuvastatin (CRESTOR) 10 MG tablet Take 1 tablet (10 mg total) by mouth daily. 90  tablet 3   triamcinolone (KENALOG) 0.1 % Apply 1 application topically daily as needed (itching/irritation/rash).     No current facility-administered medications for this visit.     Past Medical History:  Diagnosis Date   Acute appendicitis 01/21/2014   Arthritis    Dysrhythmia    PSVT, bradycardia   History of hiatal hernia    Hypertension     ROS:   All systems reviewed and negative except as noted in the HPI.   Past Surgical History:  Procedure Laterality Date   BACK SURGERY     and neck and trigger finger surgery   CARDIOVERSION N/A 08/29/2020   Procedure: CARDIOVERSION;  Surgeon: Sanda Klein, MD;  Location: [facility];  Service: Cardiovascular;  Laterality: N/A;   CERVICAL SPINE SURGERY     ESOPHAGOGASTRODUODENOSCOPY (EGD) WITH PROPOFOL Left 12/24/2015   Procedure: ESOPHAGOGASTRODUODENOSCOPY (EGD) WITH PROPOFOL;  Surgeon: Otis Brace, MD;  Location: [facility];  Service: Gastroenterology;  Laterality: Left;   HIATAL HERNIA REPAIR N/A 12/26/2015   Procedure: LAPAROSCOPIC REPAIR OF HIATAL HERNIA WITH MESH;  Surgeon: Michael Boston, MD;  Location: [place];  Service: General;  Laterality: N/A;   INGUINAL HERNIA REPAIR Left 01/19/2020   Procedure: LEFT INGUINAL HERNIA REPAIR WITH MESH;  Surgeon: Stark Klein, MD;  Location: [place];  Service: General;  Laterality: Left;   INSERTION OF MESH Left 01/19/2020   Procedure: INSERTION  OF MESH;  Surgeon: Stark Klein, MD;  Location: [place];  Service: General;  Laterality: Left;   LAPAROSCOPIC APPENDECTOMY N/A 01/21/2014   Procedure: APPENDECTOMY LAPAROSCOPIC;  Surgeon: Autumn Messing III, MD;  Location: [place];  Service: General;  Laterality: N/A;   LEAD REVISION/REPAIR N/A 06/26/2020   Procedure: LEAD REVISION/REPAIR;  Surgeon: [name], MD;  Location: [place] [facility];  Service: Cardiovascular;  Laterality: N/A;   PACEMAKER IMPLANT N/A 06/20/2020   Procedure: PACEMAKER IMPLANT;  Surgeon: Evans Lance, MD;  Location: [facility];  Service: Cardiovascular;  Laterality: N/A;   RESECTION OF RETROPERITONEAL MASS N/A 01/19/2020   Procedure: 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;  Surgeon: Stark Klein, MD;  Location: [place];  Service: General;  Laterality: N/A;   TRIGGER FINGER RELEASE Left 05/2019     Family History  Problem Relation Age of Onset   Prostate cancer Brother    Cancer Paternal Uncle        Unknown what type     Social History   Socioeconomic History   Marital status: Married    Spouse name: Not on file   Number of children: Not on file   Years of education: Not on file   Highest education level: Not on file  Occupational History   Not on file  Tobacco Use   Smoking status: Never   Smokeless tobacco: Never  Vaping Use   Vaping Use: Never used  Substance and Sexual Activity   Alcohol use: Yes    Comment: Occassionally   Drug use: No   Sexual activity: Not on file  Other Topics Concern   Not on file  Social History Narrative   Not on file   Social Determinants of Health   Financial Resource Strain: Not on file  Food Insecurity: Not on file  Transportation Needs: Not on file  Physical Activity: Not on file  Stress: Not on file  Social Connections: Not on file  Intimate Partner Violence: Not on file     Ht 6' (1.829 m)   Wt 163 lb (73.9 kg)   BMI 22.11 kg/m   Physical Exam:  Well appearing NAD HEENT: Unremarkable Neck:  No JVD, no thyromegally Lymphatics:  No adenopathy Back:  No CVA tenderness Lungs:  Clear HEART:  Regular rate rhythm, no murmurs, no rubs, no clicks Abd:  soft, positive bowel sounds, no organomegally, no rebound, no guarding Ext:  2 plus pulses, no edema, no cyanosis, no clubbing Skin:  No rashes no nodules Neuro:  CN II through XII intact, motor grossly intact   DEVICE  Normal device function.  See PaceArt for details.   Assess/Plan:  1. Persistent atrial fib - He has restarted his [place] with no difficulty. He has  reverted back to NSR and he feels well.  2. Sinus node dysfunction - he is asymptomatic, s/p PPM insertion 3. Pericardial effusion - resolved by echo with no residual symptoms. 4. Oral anti-coagulation - he has had no bleeding on eliquis.    William Avila William Burdick,MD

## 2020-10-12 ENCOUNTER — Other Ambulatory Visit: Payer: Self-pay | Admitting: Internal Medicine

## 2020-10-15 NOTE — Progress Notes (Signed)
Remote pacemaker transmission.   

## 2020-11-10 ENCOUNTER — Other Ambulatory Visit: Payer: Self-pay | Admitting: Student

## 2020-11-10 DIAGNOSIS — I4819 Other persistent atrial fibrillation: Secondary | ICD-10-CM

## 2020-11-13 NOTE — Telephone Encounter (Signed)
Pt last saw Dr Lovena Le 10/09/20, last labs 08/23/20 Creat 1.32, age 75, weight 73.9kg, based on specified criteria pt is on appropriate dosage of Eliquis 5mg  BID.  Will refill rx.

## 2020-11-26 ENCOUNTER — Ambulatory Visit: Payer: Medicare HMO | Admitting: Cardiology

## 2020-12-12 ENCOUNTER — Other Ambulatory Visit: Payer: Self-pay | Admitting: General Surgery

## 2020-12-12 DIAGNOSIS — C480 Malignant neoplasm of retroperitoneum: Secondary | ICD-10-CM

## 2020-12-20 ENCOUNTER — Ambulatory Visit (INDEPENDENT_AMBULATORY_CARE_PROVIDER_SITE_OTHER): Payer: Medicare HMO

## 2020-12-20 DIAGNOSIS — I495 Sick sinus syndrome: Secondary | ICD-10-CM

## 2020-12-25 LAB — CUP PACEART REMOTE DEVICE CHECK
Battery Remaining Longevity: 119 mo
Battery Remaining Percentage: 95.5 %
Battery Voltage: 3.02 V
Brady Statistic AP VP Percent: 1 %
Brady Statistic AP VS Percent: 62 %
Brady Statistic AS VP Percent: 1 %
Brady Statistic AS VS Percent: 37 %
Brady Statistic RA Percent Paced: 58 %
Brady Statistic RV Percent Paced: 1 %
Date Time Interrogation Session: 20220901020018
Implantable Lead Implant Date: 20220302
Implantable Lead Implant Date: 20220302
Implantable Lead Location: 753859
Implantable Lead Location: 753860
Implantable Pulse Generator Implant Date: 20220302
Lead Channel Impedance Value: 390 Ohm
Lead Channel Impedance Value: 450 Ohm
Lead Channel Pacing Threshold Amplitude: 0.5 V
Lead Channel Pacing Threshold Amplitude: 1 V
Lead Channel Pacing Threshold Pulse Width: 0.4 ms
Lead Channel Pacing Threshold Pulse Width: 0.5 ms
Lead Channel Sensing Intrinsic Amplitude: 12 mV
Lead Channel Sensing Intrinsic Amplitude: 5 mV
Lead Channel Setting Pacing Amplitude: 2 V
Lead Channel Setting Pacing Amplitude: 2.5 V
Lead Channel Setting Pacing Pulse Width: 0.5 ms
Lead Channel Setting Sensing Sensitivity: 2 mV
Pulse Gen Model: 2272
Pulse Gen Serial Number: 6376316

## 2021-01-01 ENCOUNTER — Other Ambulatory Visit: Payer: Self-pay | Admitting: General Surgery

## 2021-01-01 ENCOUNTER — Telehealth: Payer: Self-pay

## 2021-01-01 DIAGNOSIS — C480 Malignant neoplasm of retroperitoneum: Secondary | ICD-10-CM

## 2021-01-01 MED ORDER — PREDNISONE 50 MG PO TABS: ORAL_TABLET | ORAL | 0 refills | Status: AC

## 2021-01-01 NOTE — Telephone Encounter (Signed)
Phone call to patient to review instructions for 13 hr prep for CT w/ contrast on 01/18/21 at 9:40AM. Prescription escribed to [facility]. Pt aware and verbalized understanding of instructions.   Prescription: Pt to take 50 mg of prednisone on 01/17/21 at 8:40PM, 50 mg of prednisone on 01/18/21 at 02:40AM, and 50 mg of prednisone on 01/18/21 at 08:40AM. Pt is also to take 50 mg of benadryl on 01/18/21 at 8:40. Pt plans to take home supply of Benadryl for 13HR prep.   Please call [phone] with any questions.

## 2021-01-01 NOTE — Progress Notes (Signed)
Remote pacemaker transmission.   

## 2021-01-02 ENCOUNTER — Other Ambulatory Visit: Payer: Medicare HMO

## 2021-01-18 ENCOUNTER — Inpatient Hospital Stay: Admission: RE | Admit: 2021-01-18 | Payer: Medicare HMO | Source: Ambulatory Visit

## 2021-01-18 ENCOUNTER — Ambulatory Visit
Admission: RE | Admit: 2021-01-18 | Discharge: 2021-01-18 | Disposition: A | Discharge status: Home / Self Care | Payer: Medicare HMO | Source: Ambulatory Visit | Attending: General Surgery | Admitting: General Surgery

## 2021-01-18 ENCOUNTER — Other Ambulatory Visit: Payer: Self-pay

## 2021-01-18 DIAGNOSIS — K5730 Diverticulosis of large intestine without perforation or abscess without bleeding: Secondary | ICD-10-CM | POA: Diagnosis not present

## 2021-01-18 DIAGNOSIS — C480 Malignant neoplasm of retroperitoneum: Secondary | ICD-10-CM

## 2021-01-18 DIAGNOSIS — I358 Other nonrheumatic aortic valve disorders: Secondary | ICD-10-CM | POA: Diagnosis not present

## 2021-01-18 DIAGNOSIS — C7800 Secondary malignant neoplasm of unspecified lung: Secondary | ICD-10-CM | POA: Diagnosis not present

## 2021-01-18 DIAGNOSIS — N401 Enlarged prostate with lower urinary tract symptoms: Secondary | ICD-10-CM | POA: Diagnosis not present

## 2021-01-18 DIAGNOSIS — I700 Atherosclerosis of aorta: Secondary | ICD-10-CM | POA: Diagnosis not present

## 2021-01-18 MED ORDER — IOPAMIDOL (ISOVUE-300) INJECTION 61%
100.0000 mL | Freq: Once | INTRAVENOUS | Status: AC | PRN
  Administered 2021-01-18: 100 mL via INTRAVENOUS

## 2021-01-21 ENCOUNTER — Telehealth: Payer: Self-pay | Admitting: General Surgery

## 2021-01-21 NOTE — Telephone Encounter (Signed)
I discussed CT with the patient.  Metastatic disease is present.  I spoke with Dr Alen Blew as well today who is going to get him in to discuss other treatment options.  I am not sure if he will want a biopsy or treat presumptively.

## 2021-01-22 ENCOUNTER — Telehealth: Payer: Self-pay | Admitting: Oncology

## 2021-01-22 NOTE — Telephone Encounter (Signed)
Scheduled appt per 10/3 staff msg from Dr. Alen Blew. Pt is aware of appt date and time. Pt is also aware to arrive 15 mins prior to appt start time.

## 2021-01-24 ENCOUNTER — Other Ambulatory Visit: Payer: Self-pay | Admitting: Oncology

## 2021-01-24 ENCOUNTER — Other Ambulatory Visit: Payer: Self-pay

## 2021-01-24 ENCOUNTER — Inpatient Hospital Stay: Payer: Medicare HMO | Attending: Oncology | Admitting: Oncology

## 2021-01-24 VITALS — BP 136/87 | HR 52 | Temp 97.9°F | Resp 17 | Ht 72.0 in | Wt 165.5 lb

## 2021-01-24 DIAGNOSIS — Z8042 Family history of malignant neoplasm of prostate: Secondary | ICD-10-CM

## 2021-01-24 DIAGNOSIS — Z809 Family history of malignant neoplasm, unspecified: Secondary | ICD-10-CM

## 2021-01-24 DIAGNOSIS — I700 Atherosclerosis of aorta: Secondary | ICD-10-CM | POA: Insufficient documentation

## 2021-01-24 DIAGNOSIS — I2510 Atherosclerotic heart disease of native coronary artery without angina pectoris: Secondary | ICD-10-CM | POA: Insufficient documentation

## 2021-01-24 DIAGNOSIS — Z79899 Other long term (current) drug therapy: Secondary | ICD-10-CM | POA: Insufficient documentation

## 2021-01-24 DIAGNOSIS — C480 Malignant neoplasm of retroperitoneum: Secondary | ICD-10-CM

## 2021-01-24 DIAGNOSIS — N400 Enlarged prostate without lower urinary tract symptoms: Secondary | ICD-10-CM | POA: Insufficient documentation

## 2021-01-24 DIAGNOSIS — C7802 Secondary malignant neoplasm of left lung: Secondary | ICD-10-CM | POA: Insufficient documentation

## 2021-01-24 DIAGNOSIS — C7801 Secondary malignant neoplasm of right lung: Secondary | ICD-10-CM | POA: Diagnosis not present

## 2021-01-24 MED ORDER — LIDOCAINE-PRILOCAINE 2.5-2.5 % EX CREA: 1.0000 "application " | TOPICAL_CREAM | CUTANEOUS | 0 refills | Status: AC | PRN

## 2021-01-24 NOTE — Progress Notes (Signed)
Reason for the request:    Sarcoma  HPI: I was asked by Dr. Barry Dienes to evaluate William Avila for advanced sarcoma.  He is a 75 year old man with history of cardiac arrhythmia with a pacemaker in place who was diagnosed with sarcoma in 2021.  At that time he presented with right retroperitoneal mass after complaints of pain to his primary care physician.  He was evaluated by Dr. Barry Dienes and found to have an 11 cm mass in the retroperitoneum.  He underwent a radical resection by Dr. Barry Dienes on January 19, 2020.  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  He subsequently received adjuvant radiation therapy under the care of Dr. Lisbeth Renshaw completed on April 06, 2020.  He received a total dose of 48.6 Gray to the right abdomen.  He remained on follow-up and active surveillance and underwent staging scans in March 2022 which showed no evidence of relapsed disease and on January 18, 2021.  CT scan at that time showed metastatic disease in the lungs bilaterally.  No evidence of metastatic disease anywhere else.  He was found to have several new large pulmonary nodules bilaterally.  The largest measuring in the left lung 5.7 x 5.1 and the largest mass in the right lung measuring 6 x 3.8 cm.  Based on these findings, he presents today for evaluation.  Clinically, he reports feeling well without any complaints.  He denies any nausea, vomiting or abdominal pain.  He denies any shortness of breath, cough wheezing or hemoptysis.  He does not report any headaches, blurry vision, syncope or seizures. Does not report any fevers, chills or sweats.  Does not report any cough, wheezing or hemoptysis.  Does not report any chest pain, palpitation, orthopnea or leg edema.  Does not report any nausea, vomiting or abdominal pain.  Does not report any constipation or diarrhea.  Does not report any skeletal complaints.    Does not report frequency, urgency or  hematuria.  Does not report any skin rashes or lesions. Does not report any heat or cold intolerance.  Does not report any lymphadenopathy or petechiae.  Does not report any anxiety or depression.  Remaining review of systems is negative.     Past Medical History:  Diagnosis Date   Acute appendicitis 01/21/2014   Arthritis    Dysrhythmia    PSVT, bradycardia   History of hiatal hernia    Hypertension   :   Past Surgical History:  Procedure Laterality Date   BACK SURGERY     and neck and trigger finger surgery   CARDIOVERSION N/A 08/29/2020   Procedure: CARDIOVERSION;  Surgeon: Sanda Klein, MD;  Location: [facility];  Service: Cardiovascular;  Laterality: N/A;   CERVICAL SPINE SURGERY     ESOPHAGOGASTRODUODENOSCOPY (EGD) WITH PROPOFOL Left 12/24/2015   Procedure: ESOPHAGOGASTRODUODENOSCOPY (EGD) WITH PROPOFOL;  Surgeon: Otis Brace, MD;  Location: [facility];  Service: Gastroenterology;  Laterality: Left;   HIATAL HERNIA REPAIR N/A 12/26/2015   Procedure: LAPAROSCOPIC REPAIR OF HIATAL HERNIA WITH MESH;  Surgeon: Michael Boston, MD;  Location: [place];  Service: General;  Laterality: N/A;   INGUINAL HERNIA REPAIR Left 01/19/2020   Procedure: LEFT INGUINAL HERNIA REPAIR WITH MESH;  Surgeon: Stark Klein, MD;  Location: [place];  Service: General;  Laterality: Left;   INSERTION OF MESH Left 01/19/2020   Procedure: INSERTION OF MESH;  Surgeon: Stark Klein, MD;  Location: [place];  Service: General;  Laterality: Left;   LAPAROSCOPIC APPENDECTOMY N/A  01/21/2014   Procedure: APPENDECTOMY LAPAROSCOPIC;  Surgeon: Autumn Messing III, MD;  Location: [place];  Service: General;  Laterality: N/A;   LEAD REVISION/REPAIR N/A 06/26/2020   Procedure: LEAD REVISION/REPAIR;  Surgeon: [name], MD;  Location: [facility];  Service: Cardiovascular;  Laterality: N/A;   PACEMAKER IMPLANT N/A 06/20/2020   Procedure: PACEMAKER IMPLANT;  Surgeon: Evans Lance, MD;  Location: [facility] [facility];  Service:  Cardiovascular;  Laterality: N/A;   RESECTION OF RETROPERITONEAL MASS N/A 01/19/2020   Procedure: 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;  Surgeon: Stark Klein, MD;  Location: [place];  Service: General;  Laterality: N/A;   TRIGGER FINGER RELEASE Left 05/2019  :   Current Outpatient Medications:    acetaminophen (TYLENOL) 500 MG tablet, Take 500-1,000 mg by mouth every 6 (six) hours as needed for mild pain., Disp: , Rfl:    apixaban (ELIQUIS) 5 MG TABS tablet, TAKE 1 TABLET BY MOUTH TWICE A DAY, Disp: 60 tablet, Rfl: 6   lisinopril (ZESTRIL) 10 MG tablet, Take 15 mg by mouth daily., Disp: , Rfl:    metoprolol succinate (TOPROL XL) 25 MG 24 hr tablet, Take 1 tablet (25 mg total) by mouth daily., Disp: 90 tablet, Rfl: 3   Multiple Vitamins-Minerals (MULTIVITAMIN WITH MINERALS) tablet, Take 1 tablet by mouth in the morning., Disp: , Rfl:    ondansetron (ZOFRAN-ODT) 8 MG disintegrating tablet, Take 8 mg by mouth every 8 (eight) hours as needed for nausea or vomiting., Disp: , Rfl:    Polyethyl Glycol-Propyl Glycol (SYSTANE) 0.4-0.3 % SOLN, Place 1-2 drops into both eyes 3 (three) times daily as needed (dry/irritated eyes.)., Disp: , Rfl:    predniSONE (DELTASONE) 50 MG tablet, Pt to take 50 mg of prednisone on 01/17/21 at 8:40PM, 50 mg of prednisone on 01/18/21 at 02:40AM, and 50 mg of prednisone on 01/18/21 at 08:40AM. Pt is also to take 50 mg of benadryl on 01/18/21 at 8:40. Please call [phone] with any questions., Disp: 3 tablet, Rfl: 0   rosuvastatin (CRESTOR) 10 MG tablet, Take 1 tablet (10 mg total) by mouth daily., Disp: 90 tablet, Rfl: 3   triamcinolone (KENALOG) 0.1 %, Apply 1 application topically daily as needed (itching/irritation/rash)., Disp: , Rfl: :   Allergies  Allergen Reactions   Codeine Anaphylaxis   Ibuprofen Anaphylaxis   Robaxin [Methocarbamol] Anaphylaxis, Hives, Swelling and Rash   Chlorhexidine Other (See Comments)    Unknown reaction   Contrast  Media [Iodinated Diagnostic Agents] Itching and Other (See Comments)    Itching and bumps on skin.   Fentanyl And Related Other (See Comments)    Dry heaves (lasted for 14 hours)   Other Other (See Comments)    Nerve Block Tray: causes nausea/dry heaves   Tramadol Hcl Other (See Comments)    Changes personality, makes him angry  :   Family History  Problem Relation Age of Onset   Prostate cancer Brother    Cancer Paternal Uncle        Unknown what type  :   Social History   Socioeconomic History   Marital status: Married    Spouse name: Not on file   Number of children: Not on file   Years of education: Not on file   Highest education level: Not on file  Occupational History   Not on file  Tobacco Use   Smoking status: Never   Smokeless tobacco: Never  Vaping Use   Vaping Use: Never used  Substance and Sexual Activity   Alcohol use: Yes    Comment: Occassionally   Drug use: No   Sexual activity: Not on file  Other Topics Concern   Not on file  Social History Narrative   Not on file   Social Determinants of Health   Financial Resource Strain: Not on file  Food Insecurity: Not on file  Transportation Needs: Not on file  Physical Activity: Not on file  Stress: Not on file  Social Connections: Not on file  Intimate Partner Violence: Not on file  :  Pertinent items are noted in HPI.  Exam: Blood pressure 136/87, pulse (!) 52, temperature 97.9 F (36.6 C), temperature source Oral, resp. rate 17, height 6' (1.829 m), weight 165 lb 8 oz (75.1 kg), SpO2 100 %.  ECOG 0 General appearance: alert and cooperative appeared without distress. Head: atraumatic without any abnormalities. Eyes: conjunctivae/corneas clear. PERRL.  Sclera anicteric. Throat: lips, mucosa, and tongue normal; without oral thrush or ulcers. Resp: clear to auscultation bilaterally without rhonchi, wheezes or dullness to percussion. Cardio: regular rate and rhythm, S1, S2 normal, no murmur,  click, rub or gallop GI: soft, non-tender; bowel sounds normal; no masses,  no organomegaly Skin: Skin color, texture, turgor normal. No rashes or lesions Lymph nodes: Cervical, supraclavicular, and axillary nodes normal. Neurologic: Grossly normal without any motor, sensory or deep tendon reflexes. Musculoskeletal: No joint deformity or effusion.    CT CHEST ABDOMEN PELVIS W CONTRAST  Result Date: 01/19/2021 CLINICAL DATA:  75 year old male with history of retroperitoneal liposarcoma, status post surgical resection and radiation therapy. EXAM: CT CHEST, ABDOMEN, AND PELVIS WITH CONTRAST TECHNIQUE: Multidetector CT imaging of the chest, abdomen and pelvis was performed following the standard protocol during bolus administration of intravenous contrast. CONTRAST:  163mL ISOVUE-300 IOPAMIDOL (ISOVUE-300) INJECTION 61% COMPARISON:  CT the chest, abdomen and pelvis 07/17/2020. FINDINGS: CT CHEST FINDINGS Cardiovascular: Heart size is normal. There is no significant pericardial fluid, thickening or pericardial calcification. There is aortic atherosclerosis, as well as atherosclerosis of the great vessels of the mediastinum and the coronary arteries, including calcified atherosclerotic plaque in the left anterior descending and right coronary arteries. Calcifications of the aortic valve. Mediastinum/Nodes: No pathologically enlarged mediastinal or hilar lymph nodes. Esophagus is unremarkable in appearance. No axillary lymphadenopathy. Lungs/Pleura: Several new large pulmonary nodules and masses are noted bilaterally, compatible with widespread metastatic disease to the lungs. The largest mass in the left lung is in the left lower lobe (axial image 116 of series 6) measuring 5.7 x 5.1 cm. The largest mass in the right lung is in the posterior aspect of the right upper lobe (axial image 70 of series 6) measuring 6.0 x 3.8 cm. Several other smaller lesions are also noted. No acute consolidative airspace disease.  No pleural effusions. Musculoskeletal: There are no aggressive appearing lytic or blastic lesions noted in the visualized portions of the skeleton. Orthopedic fixation hardware in the lower cervical spine. CT ABDOMEN PELVIS FINDINGS Hepatobiliary: No suspicious cystic or solid hepatic lesions. No intra or extrahepatic biliary ductal dilatation. Gallbladder is normal in appearance. Pancreas: No pancreatic mass. No pancreatic ductal dilatation. No pancreatic or peripancreatic fluid collections or inflammatory changes. Spleen: Unremarkable. Adrenals/Urinary Tract: Cortical scarring in the posterior aspect of the right kidney. Left kidney is normal in appearance. No hydroureteronephrosis. Left adrenal gland is normal. Right adrenal gland is not visualized, likely surgically absent. Urinary bladder is mildly thickened, but otherwise unremarkable in appearance. Stomach/Bowel: Postoperative changes near the gastroesophageal junction,  likely from prior fundoplication. Distal aspect of the stomach is normal in appearance. No pathologic dilatation of small bowel or colon. Numerous colonic diverticulae are noted, without surrounding inflammatory changes to suggest an acute diverticulitis at this time. The appendix is not confidently identified and may be surgically absent. Regardless, there are no inflammatory changes noted adjacent to the cecum to suggest the presence of an acute appendicitis at this time. Focal mass-like thickening of the hepatic flexure of the colon measuring approximately 6.2 x 4.5 cm (axial image 77 of series 2). Vascular/Lymphatic: Aortic atherosclerosis, without evidence of aneurysm or dissection in the abdominal or pelvic vasculature. No lymphadenopathy noted in the abdomen or pelvis. Reproductive: Prostate gland is severely enlarged with median lobe hypertrophy, overall measuring 6.2 x 5.6 x 6.9 cm. Seminal vesicles are unremarkable in appearance. Other: No significant volume of ascites.  No  pneumoperitoneum. Musculoskeletal: Chronic compression fracture of L1 and of superior endplate of L2, most severe at L1 where there is 70% loss of anterior vertebral body height. Spinal fusion hardware at L4-L5 and L5-S1 again noted. There are no aggressive appearing lytic or blastic lesions noted in the visualized portions of the skeleton. IMPRESSION: 1. Today's study demonstrates interval development of widespread metastatic disease to both lungs, as above. 2. No definitive evidence of metastatic disease in the abdomen or pelvis. 3. However, there is mass-like thickening in the region of the hepatic flexure of the colon on today's study, as above. Further evaluation with nonemergent colonoscopy is recommended in the near future if clinically appropriate. 4. Prostatomegaly with median lobe hypertrophy in the prostate gland, and chronic bladder wall thickening, suggesting chronic bladder outlet obstruction. 5. Aortic atherosclerosis, in addition to two vessel coronary artery disease. Assessment for potential risk factor modification, dietary therapy or pharmacologic therapy may be warranted, if clinically indicated. 6. There are calcifications of the aortic valve. Echocardiographic correlation for evaluation of potential valvular dysfunction may be warranted if clinically indicated. 7. Additional incidental findings, as above. These results will be called to the ordering clinician or representative by the Radiologist Assistant, and communication documented in the PACS or [employer]. Electronically Signed   By: Vinnie Langton M.D.   On: 01/19/2021 16:50    Assessment and Plan:   75 year old with:  1.  Advanced dedifferentiated liposarcoma of the retroperitoneum initially diagnosed in September 2021 and subsequently developed advanced disease in September 2022.  CT scan on January 18, 2021 showed bilateral pulmonary nodules with the largest measuring 6.0 x 3.8 and 5.7 x 5.1.   The natural course of  this disease was reviewed at this time and treatment options were discussed.  At this time there is no curative options given his advanced malignancy.  Any treatment at this time would be palliative and attempt to reduce the treatment of volume and minimize his symptoms.  Systemic chemotherapy remains his best option to do so.  Chemotherapy options including anthracycline remains the mainstay of treatment.  Single agent or in combination with ifosfamide would be a consideration.  Adriamycin versus Doxil was discussed today in detail.  Given his age, performance status and cardiac morbidities as well as chronic renal insufficiency I recommended proceeding with single agent Doxil.  Alternative regimen such as gemcitabine based among others were also reviewed.  Complication associated with this treatment including nausea, fatigue, hand-foot syndrome, infusion related complications as well as cardiac issues including heart failure.  After discussion today, he is agreeable to proceed.      2.  IV  access: Risks and benefits of using Port-A-Cath versus peripheral veins was discussed today.  Complication associated with Port-A-Cath insertion include bleeding, infection and thrombosis.  After discussing the risks and benefits, he is agreeable to proceed.  We will ask Dr. Barry Dienes to place in the near future.   3.  Antiemetics: Zofran is already available to him at this time.   4.  Baseline cardiac evaluation: Echocardiogram obtained in April showed normal EF.  No need to repeat at this time.   5.  Goals of care: Therapy is palliative at this time although aggressive measures are warranted.   6.  Follow-up: will be in the immediate future to start chemotherapy.  60  minutes were dedicated to this visit. The time was spent on reviewing laboratory data, imaging studies, discussing treatment options, discussing differential diagnosis and answering questions regarding future plan.      A copy of this consult  has been forwarded to the requesting physician.

## 2021-01-24 NOTE — Progress Notes (Signed)
START OFF PATHWAY REGIMEN - Sarcoma   [id number]:Liposomal Doxorubicin 20 mg/m2 q21 Days:   A cycle is every 21 days:     Liposomal doxorubicin   **Always confirm dose/schedule in your pharmacy ordering system**  Patient Characteristics: Abdominal/Retroperitoneal Soft-Tissue Sarcoma, Unresectable/Metastatic, First Line Histology/Anatomic Site: Common STS Histologies: Abdominal/Retroperitoneal AJCC T Category: TX AJCC N Category: Staged < 8th Ed. AJCC M Category: Staged < 8th Ed. AJCC 8 Stage Grouping: IV AJCC Grade: GX Therapeutic Status: Metastatic Line of Therapy: First Line  Intent of Therapy: Non-Curative / Palliative Intent, Discussed with Patient

## 2021-01-25 ENCOUNTER — Telehealth: Payer: Self-pay

## 2021-01-25 NOTE — Telephone Encounter (Signed)
Notified Patient of Prior Authorization approval for Lidocaine-Prilocaine 2.5% Cream. Medication is approved through 04/25/2021

## 2021-01-29 NOTE — Progress Notes (Signed)
Pharmacist Chemotherapy Monitoring - Initial Assessment    Anticipated start date: 02/05/21   The following has been reviewed per standard work regarding the patient's treatment regimen: The patient's diagnosis, treatment plan and drug doses, and organ/hematologic function Lab orders and baseline tests specific to treatment regimen  The treatment plan start date, drug sequencing, and pre-medications Prior authorization status  Patient's documented medication list, including drug-drug interaction screen and prescriptions for anti-emetics and supportive care specific to the treatment regimen The drug concentrations, fluid compatibility, administration routes, and timing of the medications to be used The patient's access for treatment and lifetime cumulative dose history, if applicable  The patient's medication allergies and previous infusion related reactions, if applicable   Changes made to treatment plan:  N/A   Follow up needed:  CBC and CMET ECHO done 07/25/20  Acquanetta Belling, RPH, BCPS, BCOP 01/29/2021  4:09 PM

## 2021-02-01 ENCOUNTER — Inpatient Hospital Stay: Payer: Medicare HMO

## 2021-02-01 ENCOUNTER — Other Ambulatory Visit: Payer: Self-pay

## 2021-02-04 ENCOUNTER — Other Ambulatory Visit: Payer: Self-pay | Admitting: Radiology

## 2021-02-05 ENCOUNTER — Other Ambulatory Visit: Payer: Medicare HMO

## 2021-02-05 ENCOUNTER — Encounter (HOSPITAL_COMMUNITY): Payer: Self-pay

## 2021-02-05 ENCOUNTER — Ambulatory Visit: Payer: Medicare HMO

## 2021-02-05 ENCOUNTER — Other Ambulatory Visit: Payer: Self-pay

## 2021-02-05 ENCOUNTER — Ambulatory Visit (HOSPITAL_COMMUNITY)
Admission: RE | Admit: 2021-02-05 | Discharge: 2021-02-05 | Disposition: A | Discharge status: Home / Self Care | Payer: Medicare HMO | Source: Ambulatory Visit | Attending: Oncology | Admitting: Oncology

## 2021-02-05 DIAGNOSIS — Z91041 Radiographic dye allergy status: Secondary | ICD-10-CM | POA: Diagnosis not present

## 2021-02-05 DIAGNOSIS — Z885 Allergy status to narcotic agent status: Secondary | ICD-10-CM | POA: Diagnosis not present

## 2021-02-05 DIAGNOSIS — Z79899 Other long term (current) drug therapy: Secondary | ICD-10-CM | POA: Diagnosis not present

## 2021-02-05 DIAGNOSIS — Z7901 Long term (current) use of anticoagulants: Secondary | ICD-10-CM | POA: Diagnosis not present

## 2021-02-05 DIAGNOSIS — Z888 Allergy status to other drugs, medicaments and biological substances status: Secondary | ICD-10-CM | POA: Diagnosis not present

## 2021-02-05 DIAGNOSIS — Z452 Encounter for adjustment and management of vascular access device: Secondary | ICD-10-CM | POA: Diagnosis not present

## 2021-02-05 DIAGNOSIS — C480 Malignant neoplasm of retroperitoneum: Secondary | ICD-10-CM | POA: Diagnosis not present

## 2021-02-05 HISTORY — PX: IR IMAGING GUIDED PORT INSERTION: IMG5740

## 2021-02-05 HISTORY — DX: Presence of cardiac pacemaker: Z95.0

## 2021-02-05 MED ORDER — MIDAZOLAM HCL 2 MG/2ML IJ SOLN
INTRAMUSCULAR | Status: AC
  Filled 2021-02-05: qty 4

## 2021-02-05 MED ORDER — DIPHENHYDRAMINE HCL 50 MG/ML IJ SOLN
INTRAMUSCULAR | Status: DC | PRN
  Administered 2021-02-05: 25 mg via INTRAVENOUS

## 2021-02-05 MED ORDER — DIPHENHYDRAMINE HCL 50 MG/ML IJ SOLN
INTRAMUSCULAR | Status: AC
  Filled 2021-02-05: qty 1

## 2021-02-05 MED ORDER — LIDOCAINE-EPINEPHRINE 1 %-1:100000 IJ SOLN
INTRAMUSCULAR | Status: AC
  Administered 2021-02-05: 20 mL via SUBCUTANEOUS
  Filled 2021-02-05: qty 1

## 2021-02-05 MED ORDER — MIDAZOLAM HCL 2 MG/2ML IJ SOLN
INTRAMUSCULAR | Status: DC | PRN
  Administered 2021-02-05 (×2): 1 mg via INTRAVENOUS

## 2021-02-05 MED ORDER — SODIUM CHLORIDE 0.9 % IV SOLN: INTRAVENOUS | Status: DC

## 2021-02-05 MED ORDER — LIDOCAINE-EPINEPHRINE 1 %-1:100000 IJ SOLN
INTRAMUSCULAR | Status: DC | PRN
  Administered 2021-02-05: 20 mL

## 2021-02-05 MED ORDER — HEPARIN SOD (PORK) LOCK FLUSH 100 UNIT/ML IV SOLN
INTRAVENOUS | Status: AC
  Filled 2021-02-05: qty 5

## 2021-02-05 MED ORDER — HEPARIN SOD (PORK) LOCK FLUSH 100 UNIT/ML IV SOLN
INTRAVENOUS | Status: DC | PRN
  Administered 2021-02-05: 500 [IU] via INTRAVENOUS

## 2021-02-05 NOTE — Discharge Instructions (Signed)
Interventional radiology phone numbers °[phone] °After hours [phone] ° ° ° °You have skin glue (dermabond) over your new port. Do not use the lidocaine cream (EMLA cream) over the skin glue until it has healed. The petroleum in the lidocaine cream will dissolve the skin glue resulting in an infection of your new port. Use ice in a zip lock bag for 1-2 minutes over your new port before the cancer center nurses access your port. ° ° °Implanted Port Insertion, Care After °This sheet gives you information about how to care for yourself after your procedure. Your health care provider may also give you more specific instructions. If you have problems or questions, contact your health care provider. °What can I expect after the procedure? °After the procedure, it is common to have: °Discomfort at the port insertion site. °Bruising on the skin over the port. This should improve over 3-4 days. °Follow these instructions at home: °Port care °After your port is placed, you will get a manufacturer's information card. The card has information about your port. Keep this card with you at all times. °Take care of the port as told by your health care provider. Ask your health care provider if you or a family member can get training for taking care of the port at home. A home health care nurse may also take care of the port. °Make sure to remember what type of port you have. °Incision care °Follow instructions from your health care provider about how to take care of your port insertion site. Make sure you: °Wash your hands with soap and water before and after you change your bandage (dressing). If soap and water are not available, use hand sanitizer. °Change your dressing as told by your health care provider. °Leave skin glue in place. These skin closures may need to stay in place for 2 weeks or longer.  °Check your port insertion site every day for signs of infection. Check for: °Redness, swelling, or pain. °Fluid or  blood. °Warmth. °Pus or a bad smell.  °  °  °Activity °Return to your normal activities as told by your health care provider. Ask your health care provider what activities are safe for you. °Do not lift anything that is heavier than 10 lb (4.5 kg), or the limit that you are told, until your health care provider says that it is safe. °General instructions °Take over-the-counter and prescription medicines only as told by your health care provider. °Do not take baths, swim, or use a hot tub until your health care provider approves.You may remove your dressing tomorrow and shower 24 hours after your procedure. °Do not drive for 24 hours if you were given a sedative during your procedure. °Wear a medical alert bracelet in case of an emergency. This will tell any health care providers that you have a port. °Keep all follow-up visits as told by your health care provider. This is important. °Contact a health care provider if: °You cannot flush your port with saline as directed, or you cannot draw blood from the port. °You have a fever or chills. °You have redness, swelling, or pain around your port insertion site. °You have fluid or blood coming from your port insertion site. °Your port insertion site feels warm to the touch. °You have pus or a bad smell coming from the port insertion site. °Get help right away if: °You have chest pain or shortness of breath. °You have bleeding from your port that you cannot control. °Summary °Take care of   the port as told by your health care provider. Keep the manufacturer's information card with you at all times. °Change your dressing as told by your health care provider. °Contact a health care provider if you have a fever or chills or if you have redness, swelling, or pain around your port insertion site. °Keep all follow-up visits as told by your health care provider. °This information is not intended to replace advice given to you by your health care provider. Make sure you discuss any  questions you have with your health care provider. °Document Revised: 11/03/2017 Document Reviewed: 11/03/2017 °Elsevier Patient Education © 2021 [employer] ° ° ° °Moderate Conscious Sedation, Adult, Care After °This sheet gives you information about how to care for yourself after your procedure. Your health care provider may also give you more specific instructions. If you have problems or questions, contact your health care provider. °What can I expect after the procedure? °After the procedure, it is common to have: °Sleepiness for several hours. °Impaired judgment for several hours. °Difficulty with balance. °Vomiting if you eat too soon. °Follow these instructions at home: °For the time period you were told by your health care provider: °Rest. °Do not participate in activities where you could fall or become injured. °Do not drive or use machinery. °Do not drink alcohol. °Do not take sleeping pills or medicines that cause drowsiness. °Do not make important decisions or sign legal documents. °Do not take care of children on your own.  °  °  °Eating and drinking °Follow the diet recommended by your health care provider. °Drink enough fluid to keep your urine pale yellow. °If you vomit: °Drink water, juice, or soup when you can drink without vomiting. °Make sure you have little or no nausea before eating solid foods.   °General instructions °Take over-the-counter and prescription medicines only as told by your health care provider. °Have a responsible adult stay with you for the time you are told. It is important to have someone help care for you until you are awake and alert. °Do not smoke. °Keep all follow-up visits as told by your health care provider. This is important. °Contact a health care provider if: °You are still sleepy or having trouble with balance after 24 hours. °You feel light-headed. °You keep feeling nauseous or you keep vomiting. °You develop a rash. °You have a fever. °You have redness or  swelling around the IV site. °Get help right away if: °You have trouble breathing. °You have new-onset confusion at home. °Summary °After the procedure, it is common to feel sleepy, have impaired judgment, or feel nauseous if you eat too soon. °Rest after you get home. Know the things you should not do after the procedure. °Follow the diet recommended by your health care provider and drink enough fluid to keep your urine pale yellow. °Get help right away if you have trouble breathing or new-onset confusion at home. °This information is not intended to replace advice given to you by your health care provider. Make sure you discuss any questions you have with your health care provider. °Document Revised: 08/05/2019 Document Reviewed: 03/03/2019 °Elsevier Patient Education © 2021 Else[employer] Inc.  °

## 2021-02-05 NOTE — Procedures (Signed)
Vascular and Interventional Radiology Procedure Note  Patient: William Avila DOB: 1945-05-01 Medical Record Number: [id number] Note Date/Time: 02/05/21 4:13 PM   Performing Physician: Michaelle Birks, MD Assistant(s): None  Diagnosis:  Liposarcoma  Procedure: PORT PLACEMENT  Anesthesia: Conscious Sedation Complications: None Estimated Blood Loss: Minimal  Findings:  Successful right-sided SL port placement, with the tip of the catheter in the distal SVC.  Plan: Catheter ready for use.  See detailed procedure note with images in PACS. The patient tolerated the procedure well without incident or complication and was returned to [facility] in stable condition.    Michaelle Birks, MD [facility] Rad[facility]   Pager. [phone] Clinic. [phone] .

## 2021-02-05 NOTE — H&P (Signed)
Chief Complaint: Patient was seen in consultation today for IR Port-a-cath insertion at the request of [facility] Physician: Michaelle Birks  Patient Status: [place]  History of Present Illness: William Avila is a 75 y.o. male with advanced liposarcoma of the retroperitoneum.  He is pursuing chemotherapy and needs a port-a-cath.      In preparation for procedure, last oral intake was breakfast at 7:45am, he did not stop his Eliquis, took all morning meds, his wife will drive him home and be with him through the night.   Past Medical History:  Diagnosis Date   Acute appendicitis 01/21/2014   Arthritis    Dysrhythmia    PSVT, bradycardia   History of hiatal hernia    Hypertension    Presence of permanent cardiac pacemaker     Past Surgical History:  Procedure Laterality Date   BACK SURGERY     and neck and trigger finger surgery   CARDIOVERSION N/A 08/29/2020   Procedure: CARDIOVERSION;  Surgeon: Sanda Klein, MD;  Location: [facility];  Service: Cardiovascular;  Laterality: N/A;   CERVICAL SPINE SURGERY     ESOPHAGOGASTRODUODENOSCOPY (EGD) WITH PROPOFOL Left 12/24/2015   Procedure: ESOPHAGOGASTRODUODENOSCOPY (EGD) WITH PROPOFOL;  Surgeon: Otis Brace, MD;  Location: [place];  Service: Gastroenterology;  Laterality: Left;   HIATAL HERNIA REPAIR N/A 12/26/2015   Procedure: LAPAROSCOPIC REPAIR OF HIATAL HERNIA WITH MESH;  Surgeon: Michael Boston, MD;  Location: [place];  Service: General;  Laterality: N/A;   INGUINAL HERNIA REPAIR Left 01/19/2020   Procedure: LEFT INGUINAL HERNIA REPAIR WITH MESH;  Surgeon: Stark Klein, MD;  Location: [place];  Service: General;  Laterality: Left;   INSERTION OF MESH Left 01/19/2020   Procedure: INSERTION OF MESH;  Surgeon: Stark Klein, MD;  Location: [place];  Service: General;  Laterality: Left;   LAPAROSCOPIC APPENDECTOMY N/A 01/21/2014   Procedure: APPENDECTOMY LAPAROSCOPIC;  Surgeon: Autumn Messing III, MD;   Location: [place];  Service: General;  Laterality: N/A;   LEAD REVISION/REPAIR N/A 06/26/2020   Procedure: LEAD REVISION/REPAIR;  Surgeon: [name], MD;  Location: [place] [facility];  Service: Cardiovascular;  Laterality: N/A;   PACEMAKER IMPLANT N/A 06/20/2020   Procedure: PACEMAKER IMPLANT;  Surgeon: Evans Lance, MD;  Location: [place] [facility];  Service: Cardiovascular;  Laterality: N/A;   RESECTION OF RETROPERITONEAL MASS N/A 01/19/2020   Procedure: 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;  Surgeon: Stark Klein, MD;  Location: [place];  Service: General;  Laterality: N/A;   TRIGGER FINGER RELEASE Left 05/2019    Allergies: Codeine, Ibuprofen, Robaxin [methocarbamol], Chlorhexidine, Contrast media [iodinated diagnostic agents], Fentanyl and related, Other, and Tramadol hcl  Medications: Prior to Admission medications   Medication Sig Start Date End Date Taking? Authorizing Provider  acetaminophen (TYLENOL) 500 MG tablet Take 500-1,000 mg by mouth every 6 (six) hours as needed for mild pain.   Yes [provider]  apixaban (ELIQUIS) 5 MG TABS tablet TAKE 1 TABLET BY MOUTH TWICE A DAY 11/13/20  Yes Evans Lance, MD  lisinopril (ZESTRIL) 10 MG tablet Take 15 mg by mouth daily.   Yes [provider]  metoprolol succinate (TOPROL XL) 25 MG 24 hr tablet Take 1 tablet (25 mg total) by mouth daily. 08/23/20  Yes Donato Heinz, MD  Multiple Vitamins-Minerals (MULTIVITAMIN WITH MINERALS) tablet Take 1 tablet by mouth in the morning.   Yes [provider]  Polyethyl Glycol-Propyl Glycol (SYSTANE) 0.4-0.3 %  SOLN Place 1-2 drops into both eyes 3 (three) times daily as needed (dry/irritated eyes.).   Yes [provider]  rosuvastatin (CRESTOR) 10 MG tablet Take 1 tablet (10 mg total) by mouth daily. 08/23/20 02/05/21 Yes Donato Heinz, MD  lidocaine-prilocaine (EMLA) cream Apply 1 application topically as needed. 01/24/21    [name], MD  ondansetron (ZOFRAN-ODT) 8 MG disintegrating tablet Take 8 mg by mouth every 8 (eight) hours as needed for nausea or vomiting. 01/17/20   [provider]  predniSONE (DELTASONE) 50 MG tablet Pt to take 50 mg of prednisone on 01/17/21 at 8:40PM, 50 mg of prednisone on 01/18/21 at 02:40AM, and 50 mg of prednisone on 01/18/21 at 08:40AM. Pt is also to take 50 mg of benadryl on 01/18/21 at 8:40. Please call [phone] with any questions. 01/01/21   Titus Dubin, MD  triamcinolone (KENALOG) 0.1 % Apply 1 application topically daily as needed (itching/irritation/rash). 06/08/19   [provider]     Family History  Problem Relation Age of Onset   Prostate cancer Brother    Cancer Paternal Uncle        Unknown what type    Social History   Socioeconomic History   Marital status: Married    Spouse name: Not on file   Number of children: Not on file   Years of education: Not on file   Highest education level: Not on file  Occupational History   Not on file  Tobacco Use   Smoking status: Never   Smokeless tobacco: Never  Vaping Use   Vaping Use: Never used  Substance and Sexual Activity   Alcohol use: Yes    Comment: Occassionally   Drug use: No   Sexual activity: Not on file  Other Topics Concern   Not on file  Social History Narrative   Not on file   Social Determinants of Health   Financial Resource Strain: Not on file  Food Insecurity: Not on file  Transportation Needs: Not on file  Physical Activity: Not on file  Stress: Not on file  Social Connections: Not on file     Review of Systems: A 12 point ROS discussed and pertinent positives are indicated in the HPI above.  All other systems are negative.  Review of Systems  Constitutional: Negative.   HENT: Negative.    Eyes: Negative.   Respiratory: Negative.    Cardiovascular: Negative.   Gastrointestinal: Negative.   Endocrine: Negative.   Genitourinary: Negative.    Musculoskeletal:  Positive for neck stiffness.  Neurological:  Positive for headaches.  Hematological:  Bruises/bleeds easily.  Psychiatric/Behavioral: Negative.     Vital Signs: BP (!) 145/94   Pulse 94   Temp 98.4 F (36.9 C) (Oral)   Resp 16   SpO2 99%   Physical Exam Vitals reviewed.  Constitutional:      General: He is not in acute distress.    Appearance: Normal appearance. He is not toxic-appearing or diaphoretic.  HENT:     Head: Normocephalic and atraumatic.     Mouth/Throat:     Mouth: Mucous membranes are moist.     Pharynx: Oropharynx is clear.  Eyes:     Extraocular Movements: Extraocular movements intact.     Conjunctiva/sclera: Conjunctivae normal.  Cardiovascular:     Rate and Rhythm: Normal rate and regular rhythm.     Pulses: Normal pulses.  Pulmonary:     Effort: Pulmonary effort is normal.  Skin:  General: Skin is warm and dry.     Capillary Refill: Capillary refill takes 2 to 3 seconds.     Findings: Bruising present.  Neurological:     General: No focal deficit present.     Mental Status: He is alert and oriented to person, place, and time.  Psychiatric:        Mood and Affect: Mood normal.        Thought Content: Thought content normal.    Imaging: CT CHEST ABDOMEN PELVIS W CONTRAST  Result Date: 01/19/2021 CLINICAL DATA:  75 year old male with history of retroperitoneal liposarcoma, status post surgical resection and radiation therapy. EXAM: CT CHEST, ABDOMEN, AND PELVIS WITH CONTRAST TECHNIQUE: Multidetector CT imaging of the chest, abdomen and pelvis was performed following the standard protocol during bolus administration of intravenous contrast. CONTRAST:  173mL ISOVUE-300 IOPAMIDOL (ISOVUE-300) INJECTION 61% COMPARISON:  CT the chest, abdomen and pelvis 07/17/2020. FINDINGS: CT CHEST FINDINGS Cardiovascular: Heart size is normal. There is no significant pericardial fluid, thickening or pericardial calcification. There is aortic  atherosclerosis, as well as atherosclerosis of the great vessels of the mediastinum and the coronary arteries, including calcified atherosclerotic plaque in the left anterior descending and right coronary arteries. Calcifications of the aortic valve. Mediastinum/Nodes: No pathologically enlarged mediastinal or hilar lymph nodes. Esophagus is unremarkable in appearance. No axillary lymphadenopathy. Lungs/Pleura: Several new large pulmonary nodules and masses are noted bilaterally, compatible with widespread metastatic disease to the lungs. The largest mass in the left lung is in the left lower lobe (axial image 116 of series 6) measuring 5.7 x 5.1 cm. The largest mass in the right lung is in the posterior aspect of the right upper lobe (axial image 70 of series 6) measuring 6.0 x 3.8 cm. Several other smaller lesions are also noted. No acute consolidative airspace disease. No pleural effusions. Musculoskeletal: There are no aggressive appearing lytic or blastic lesions noted in the visualized portions of the skeleton. Orthopedic fixation hardware in the lower cervical spine. CT ABDOMEN PELVIS FINDINGS Hepatobiliary: No suspicious cystic or solid hepatic lesions. No intra or extrahepatic biliary ductal dilatation. Gallbladder is normal in appearance. Pancreas: No pancreatic mass. No pancreatic ductal dilatation. No pancreatic or peripancreatic fluid collections or inflammatory changes. Spleen: Unremarkable. Adrenals/Urinary Tract: Cortical scarring in the posterior aspect of the right kidney. Left kidney is normal in appearance. No hydroureteronephrosis. Left adrenal gland is normal. Right adrenal gland is not visualized, likely surgically absent. Urinary bladder is mildly thickened, but otherwise unremarkable in appearance. Stomach/Bowel: Postoperative changes near the gastroesophageal junction, likely from prior fundoplication. Distal aspect of the stomach is normal in appearance. No pathologic dilatation of small  bowel or colon. Numerous colonic diverticulae are noted, without surrounding inflammatory changes to suggest an acute diverticulitis at this time. The appendix is not confidently identified and may be surgically absent. Regardless, there are no inflammatory changes noted adjacent to the cecum to suggest the presence of an acute appendicitis at this time. Focal mass-like thickening of the hepatic flexure of the colon measuring approximately 6.2 x 4.5 cm (axial image 77 of series 2). Vascular/Lymphatic: Aortic atherosclerosis, without evidence of aneurysm or dissection in the abdominal or pelvic vasculature. No lymphadenopathy noted in the abdomen or pelvis. Reproductive: Prostate gland is severely enlarged with median lobe hypertrophy, overall measuring 6.2 x 5.6 x 6.9 cm. Seminal vesicles are unremarkable in appearance. Other: No significant volume of ascites.  No pneumoperitoneum. Musculoskeletal: Chronic compression fracture of L1 and of superior endplate of L2, most  severe at L1 where there is 70% loss of anterior vertebral body height. Spinal fusion hardware at L4-L5 and L5-S1 again noted. There are no aggressive appearing lytic or blastic lesions noted in the visualized portions of the skeleton. IMPRESSION: 1. Today's study demonstrates interval development of widespread metastatic disease to both lungs, as above. 2. No definitive evidence of metastatic disease in the abdomen or pelvis. 3. However, there is mass-like thickening in the region of the hepatic flexure of the colon on today's study, as above. Further evaluation with nonemergent colonoscopy is recommended in the near future if clinically appropriate. 4. Prostatomegaly with median lobe hypertrophy in the prostate gland, and chronic bladder wall thickening, suggesting chronic bladder outlet obstruction. 5. Aortic atherosclerosis, in addition to two vessel coronary artery disease. Assessment for potential risk factor modification, dietary therapy or  pharmacologic therapy may be warranted, if clinically indicated. 6. There are calcifications of the aortic valve. Echocardiographic correlation for evaluation of potential valvular dysfunction may be warranted if clinically indicated. 7. Additional incidental findings, as above. These results will be called to the ordering clinician or representative by the Radiologist Assistant, and communication documented in the PACS or [employer]. Electronically Signed   By: Vinnie Langton M.D.   On: 01/19/2021 16:50    Labs:  CBC: Recent Labs    06/18/20 1130 06/21/20 1848 06/26/20 1137 08/23/20 1042  WBC 5.4 10.4 6.0 5.1  HGB 13.1 14.0 12.7* 14.0  HCT 39.7 41.6 37.0* 42.0  PLT 173 196 177 172    COAGS: No results for input(s): INR, APTT in the last 8760 hours.  BMP: Recent Labs    05/03/20 1213 05/03/20 1213 05/29/20 0945 06/18/20 1130 06/21/20 1848 06/26/20 1137 08/23/20 1042  NA 140   < > 135 135 137 134* 138  K 4.6  --  4.4 4.3 3.8 4.2 4.7  CL 102  --  102 102 104 101 100  CO2 23  --  27 25 21* 27 25  GLUCOSE 88   < > 97 106* 104* 82 92  BUN 16   < > 19 22 19 16 16   CALCIUM 9.3  --  9.3 9.1 9.7 9.3 9.5  CREATININE 1.31*  --  1.26* 1.31* 1.31* 1.16 1.32*  GFRNONAA 53*  --  60* 57* 57* >60  --   GFRAA 62  --   --   --   --   --   --    < > = values in this interval not displayed.     Assessment and Plan:  Liposarcoma of the retroperitoneum --OK to proceed with planned port-a-cath  Risks and benefits of image guided port-a-catheter placement was discussed with the patient including, but not limited to bleeding, infection, pneumothorax, or fibrin sheath development and need for additional procedures.  All of the patient's questions were answered, patient is agreeable to proceed. Consent signed and in chart.   Thank you for this interesting consult.  I greatly enjoyed meeting William Avila and look forward to participating in their care.  A copy of this report  was sent to the requesting provider on this date.  Electronically Signed: Pasty Spillers, PA 02/05/2021, 1:31 PM   I spent a total of  30 Minutes  in face to face in clinical consultation, greater than 50% of which was counseling/coordinating care for port-a-catheter insertion

## 2021-02-06 ENCOUNTER — Encounter: Payer: Self-pay | Admitting: Oncology

## 2021-02-06 NOTE — Progress Notes (Signed)
Called pt to introduce myself as his Arboriculturist and to discuss the [employer].  I left a msg requesting he return my call if he's interested in applying for the grant.

## 2021-02-07 ENCOUNTER — Inpatient Hospital Stay: Payer: Medicare HMO

## 2021-02-07 ENCOUNTER — Other Ambulatory Visit: Payer: Self-pay

## 2021-02-07 VITALS — BP 144/99 | HR 50 | Temp 98.8°F | Resp 16 | Wt 166.0 lb

## 2021-02-07 DIAGNOSIS — N400 Enlarged prostate without lower urinary tract symptoms: Secondary | ICD-10-CM | POA: Diagnosis not present

## 2021-02-07 DIAGNOSIS — Z79899 Other long term (current) drug therapy: Secondary | ICD-10-CM | POA: Diagnosis not present

## 2021-02-07 DIAGNOSIS — Z95828 Presence of other vascular implants and grafts: Secondary | ICD-10-CM

## 2021-02-07 DIAGNOSIS — C480 Malignant neoplasm of retroperitoneum: Secondary | ICD-10-CM

## 2021-02-07 DIAGNOSIS — I2510 Atherosclerotic heart disease of native coronary artery without angina pectoris: Secondary | ICD-10-CM | POA: Diagnosis not present

## 2021-02-07 DIAGNOSIS — C7802 Secondary malignant neoplasm of left lung: Secondary | ICD-10-CM | POA: Diagnosis not present

## 2021-02-07 DIAGNOSIS — I700 Atherosclerosis of aorta: Secondary | ICD-10-CM | POA: Diagnosis not present

## 2021-02-07 LAB — CBC WITH DIFFERENTIAL (CANCER CENTER ONLY)
Abs Immature Granulocytes: 0.03 10*3/uL (ref 0.00–0.07)
Basophils Absolute: 0 10*3/uL (ref 0.0–0.1)
Basophils Relative: 1 %
Eosinophils Absolute: 0.2 10*3/uL (ref 0.0–0.5)
Eosinophils Relative: 4 %
HCT: 37.1 % — ABNORMAL LOW (ref 39.0–52.0)
Hemoglobin: 12.6 g/dL — ABNORMAL LOW (ref 13.0–17.0)
Immature Granulocytes: 1 %
Lymphocytes Relative: 21 %
Lymphs Abs: 1.2 10*3/uL (ref 0.7–4.0)
MCH: 32.9 pg (ref 26.0–34.0)
MCHC: 34 g/dL (ref 30.0–36.0)
MCV: 96.9 fL (ref 80.0–100.0)
Monocytes Absolute: 0.6 10*3/uL (ref 0.1–1.0)
Monocytes Relative: 11 %
Neutro Abs: 3.6 10*3/uL (ref 1.7–7.7)
Neutrophils Relative %: 62 %
Platelet Count: 165 10*3/uL (ref 150–400)
RBC: 3.83 MIL/uL — ABNORMAL LOW (ref 4.22–5.81)
RDW: 12.7 % (ref 11.5–15.5)
WBC Count: 5.7 10*3/uL (ref 4.0–10.5)
nRBC: 0 % (ref 0.0–0.2)

## 2021-02-07 LAB — CMP (CANCER CENTER ONLY)
ALT: 44 U/L (ref 0–44)
AST: 47 U/L — ABNORMAL HIGH (ref 15–41)
Albumin: 3.6 g/dL (ref 3.5–5.0)
Alkaline Phosphatase: 275 U/L — ABNORMAL HIGH (ref 38–126)
Anion gap: 9 (ref 5–15)
BUN: 25 mg/dL — ABNORMAL HIGH (ref 8–23)
CO2: 24 mmol/L (ref 22–32)
Calcium: 9.3 mg/dL (ref 8.9–10.3)
Chloride: 105 mmol/L (ref 98–111)
Creatinine: 1.31 mg/dL — ABNORMAL HIGH (ref 0.61–1.24)
GFR, Estimated: 57 mL/min — ABNORMAL LOW (ref >60)
Glucose, Bld: 96 mg/dL (ref 70–99)
Potassium: 4.6 mmol/L (ref 3.5–5.1)
Sodium: 138 mmol/L (ref 135–145)
Total Bilirubin: 0.5 mg/dL (ref 0.3–1.2)
Total Protein: 6.4 g/dL — ABNORMAL LOW (ref 6.5–8.1)

## 2021-02-07 MED ORDER — SODIUM CHLORIDE 0.9% FLUSH
10.0000 mL | INTRAVENOUS | Status: DC | PRN
  Administered 2021-02-07: 10 mL

## 2021-02-07 MED ORDER — SODIUM CHLORIDE 0.9 % IV SOLN
10.0000 mg | Freq: Once | INTRAVENOUS | Status: AC
  Administered 2021-02-07: 10 mg via INTRAVENOUS
  Filled 2021-02-07: qty 10

## 2021-02-07 MED ORDER — SODIUM CHLORIDE 0.9% FLUSH
10.0000 mL | Freq: Once | INTRAVENOUS | Status: AC
  Administered 2021-02-07: 10 mL via INTRAVENOUS

## 2021-02-07 MED ORDER — HEPARIN SOD (PORK) LOCK FLUSH 100 UNIT/ML IV SOLN
500.0000 [IU] | Freq: Once | INTRAVENOUS | Status: AC | PRN
  Administered 2021-02-07: 500 [IU]

## 2021-02-07 MED ORDER — DOXORUBICIN HCL LIPOSOMAL CHEMO INJECTION 2 MG/ML
20.0000 mg/m2 | Freq: Once | INTRAVENOUS | Status: AC
  Administered 2021-02-07: 40 mg via INTRAVENOUS
  Filled 2021-02-07: qty 20

## 2021-02-07 MED ORDER — DEXTROSE 5 % IV SOLN: Freq: Once | INTRAVENOUS | Status: AC

## 2021-02-07 NOTE — Progress Notes (Signed)
Patient had pain during accessing of his port and the area around his port was tender to the touch informed charge nurse and infusion nurse

## 2021-02-07 NOTE — Patient Instructions (Addendum)
[facility] ONCOLOGY   Discharge Instructions: Thank you for choosing [facility] to provide your oncology and hematology care.   If you have a lab appointment with the [facility], please go directly to the [facility] and check in at the registration area.   Wear comfortable clothing and clothing appropriate for easy access to any Portacath or PICC line.   We strive to give you quality time with your provider. You may need to reschedule your appointment if you arrive late (15 or more minutes).  Arriving late affects you and other patients whose appointments are after yours.  Also, if you miss three or more appointments without notifying the office, you may be dismissed from the clinic at the provider's discretion.      For prescription refill requests, have your pharmacy contact our office and allow 72 hours for refills to be completed.    Today you received the following chemotherapy and/or immunotherapy agents: doxorubicin HCl liposomal.      To help prevent nausea and vomiting after your treatment, we encourage you to take your nausea medication as directed.  BELOW ARE SYMPTOMS THAT SHOULD BE REPORTED IMMEDIATELY: *FEVER GREATER THAN 100.4 F (38 C) OR HIGHER *CHILLS OR SWEATING *NAUSEA AND VOMITING THAT IS NOT CONTROLLED WITH YOUR NAUSEA MEDICATION *UNUSUAL SHORTNESS OF BREATH *UNUSUAL BRUISING OR BLEEDING *URINARY PROBLEMS (pain or burning when urinating, or frequent urination) *BOWEL PROBLEMS (unusual diarrhea, constipation, pain near the anus) TENDERNESS IN MOUTH AND THROAT WITH OR WITHOUT PRESENCE OF ULCERS (sore throat, sores in mouth, or a toothache) UNUSUAL RASH, SWELLING OR PAIN  UNUSUAL VAGINAL DISCHARGE OR ITCHING   Items with * indicate a potential emergency and should be followed up as soon as possible or go to the Emergency Department if any problems should occur.  Please show the CHEMOTHERAPY ALERT CARD or IMMUNOTHERAPY ALERT  CARD at check-in to the Emergency Department and triage nurse.  Should you have questions after your visit or need to cancel or reschedule your appointment, please contact [facility]  Dept: [phone]  and follow the prompts.  Office hours are 8:00 a.m. to 4:30 p.m. Monday - Friday. Please note that voicemails left after 4:00 p.m. may not be returned until the following business day.  We are closed weekends and major holidays. You have access to a nurse at all times for urgent questions. Please call the main number to the clinic Dept: [phone] and follow the prompts.   For any non-urgent questions, you may also contact your provider using MyChart. We now offer e-Visits for anyone 23 and older to request care online for non-urgent symptoms. For details visit [email or web address].   Also download the MyChart app! Go to the app store, search "MyChart", open the app, select [facility], and log in with your MyChart username and password.  Due to Covid, a mask is required upon entering the hospital/clinic. If you do not have a mask, one will be given to you upon arrival. For doctor visits, patients may have 1 support person aged 31 or older with them. For treatment visits, patients cannot have anyone with them due to current Covid guidelines and our immunocompromised population.    Doxorubicin injection What is this medication? DOXORUBICIN (dox oh ROO bi sin) is a chemotherapy drug. It is used to treat many kinds of cancer like leukemia, lymphoma, neuroblastoma, sarcoma, and Wilms' tumor. It is also used to treat bladder cancer, breast cancer, lung cancer,  ovarian cancer, stomach cancer, and thyroid cancer. This medicine may be used for other purposes; ask your health care provider or pharmacist if you have questions. COMMON BRAND NAME(S): Adriamycin, Adriamycin PFS, Adriamycin RDF, Rubex What should I tell my care team before I take this medication? They need to  know if you have any of these conditions: heart disease history of low blood counts caused by a medicine liver disease recent or ongoing radiation therapy an unusual or allergic reaction to doxorubicin, other chemotherapy agents, other medicines, foods, dyes, or preservatives pregnant or trying to get pregnant breast-feeding How should I use this medication? This drug is given as an infusion into a vein. It is administered in a hospital or clinic by a specially trained health care professional. If you have pain, swelling, burning or any unusual feeling around the site of your injection, tell your health care professional right away. Talk to your pediatrician regarding the use of this medicine in children. Special care may be needed. Overdosage: If you think you have taken too much of this medicine contact a poison control center or emergency room at once. NOTE: This medicine is only for you. Do not share this medicine with others. What if I miss a dose? It is important not to miss your dose. Call your doctor or health care professional if you are unable to keep an appointment. What may interact with this medication? This medicine may interact with the following medications: 6-mercaptopurine paclitaxel phenytoin St. John's Wort trastuzumab verapamil This list may not describe all possible interactions. Give your health care provider a list of all the medicines, herbs, non-prescription drugs, or dietary supplements you use. Also tell them if you smoke, drink alcohol, or use illegal drugs. Some items may interact with your medicine. What should I watch for while using this medication? This drug may make you feel generally unwell. This is not uncommon, as chemotherapy can affect healthy cells as well as cancer cells. Report any side effects. Continue your course of treatment even though you feel ill unless your doctor tells you to stop. There is a maximum amount of this medicine you should  receive throughout your life. The amount depends on the medical condition being treated and your overall health. Your doctor will watch how much of this medicine you receive in your lifetime. Tell your doctor if you have taken this medicine before. You may need blood work done while you are taking this medicine. Your urine may turn red for a few days after your dose. This is not blood. If your urine is dark or brown, call your doctor. In some cases, you may be given additional medicines to help with side effects. Follow all directions for their use. Call your doctor or health care professional for advice if you get a fever, chills or sore throat, or other symptoms of a cold or flu. Do not treat yourself. This drug decreases your body's ability to fight infections. Try to avoid being around people who are sick. This medicine may increase your risk to bruise or bleed. Call your doctor or health care professional if you notice any unusual bleeding. Talk to your doctor about your risk of cancer. You may be more at risk for certain types of cancers if you take this medicine. Do not become pregnant while taking this medicine or for 6 months after stopping it. Women should inform their doctor if they wish to become pregnant or think they might be pregnant. Men should not father a  child while taking this medicine and for 6 months after stopping it. There is a potential for serious side effects to an unborn child. Talk to your health care professional or pharmacist for more information. Do not breast-feed an infant while taking this medicine. This medicine has caused ovarian failure in some women and reduced sperm counts in some men This medicine may interfere with the ability to have a child. Talk with your doctor or health care professional if you are concerned about your fertility. This medicine may cause a decrease in Co-Enzyme Q-10. You should make sure that you get enough Co-Enzyme Q-10 while you are taking  this medicine. Discuss the foods you eat and the vitamins you take with your health care professional. What side effects may I notice from receiving this medication? Side effects that you should report to your doctor or health care professional as soon as possible: allergic reactions like skin rash, itching or hives, swelling of the face, lips, or tongue breathing problems chest pain fast or irregular heartbeat low blood counts - this medicine may decrease the number of white blood cells, red blood cells and platelets. You may be at increased risk for infections and bleeding. pain, redness, or irritation at site where injected signs of infection - fever or chills, cough, sore throat, pain or difficulty passing urine signs of decreased platelets or bleeding - bruising, pinpoint red spots on the skin, black, tarry stools, blood in the urine swelling of the ankles, feet, hands tiredness weakness Side effects that usually do not require medical attention (report to your doctor or health care professional if they continue or are bothersome): diarrhea hair loss mouth sores nail discoloration or damage nausea red colored urine vomiting This list may not describe all possible side effects. Call your doctor for medical advice about side effects. You may report side effects to FDA at 1-800-FDA-1088. Where should I keep my medication? This drug is given in a hospital or clinic and will not be stored at home. NOTE: This sheet is a summary. It may not cover all possible information. If you have questions about this medicine, talk to your doctor, pharmacist, or health care provider.  2022 Elsevier/Gold Standard (2016-11-19 11:01:26)

## 2021-02-11 ENCOUNTER — Encounter: Payer: Self-pay | Admitting: Oncology

## 2021-02-11 DIAGNOSIS — M19032 Primary osteoarthritis, left wrist: Secondary | ICD-10-CM | POA: Diagnosis not present

## 2021-02-11 DIAGNOSIS — E7800 Pure hypercholesterolemia, unspecified: Secondary | ICD-10-CM | POA: Diagnosis not present

## 2021-02-11 DIAGNOSIS — I10 Essential (primary) hypertension: Secondary | ICD-10-CM | POA: Diagnosis not present

## 2021-02-11 NOTE — Progress Notes (Signed)
Pt returned my call and is interested in applying for the [employer].  Pt will bring his proof of income on 02/26/21.  If approved I will give him an expense sheet and my card for any questions or concerns he may have in the future.

## 2021-02-25 MED FILL — Dexamethasone Sodium Phosphate Inj 100 MG/10ML: INTRAMUSCULAR | Qty: 1 | Status: AC

## 2021-02-26 ENCOUNTER — Other Ambulatory Visit: Payer: Self-pay

## 2021-02-26 ENCOUNTER — Inpatient Hospital Stay (HOSPITAL_BASED_OUTPATIENT_CLINIC_OR_DEPARTMENT_OTHER): Payer: Medicare HMO | Admitting: Oncology

## 2021-02-26 ENCOUNTER — Encounter: Payer: Self-pay | Admitting: Oncology

## 2021-02-26 ENCOUNTER — Inpatient Hospital Stay: Payer: Medicare HMO

## 2021-02-26 ENCOUNTER — Inpatient Hospital Stay: Payer: Medicare HMO | Attending: Oncology

## 2021-02-26 VITALS — BP 135/93 | HR 51 | Temp 97.0°F | Resp 17 | Wt 168.0 lb

## 2021-02-26 DIAGNOSIS — Z923 Personal history of irradiation: Secondary | ICD-10-CM | POA: Insufficient documentation

## 2021-02-26 DIAGNOSIS — Z885 Allergy status to narcotic agent status: Secondary | ICD-10-CM | POA: Diagnosis not present

## 2021-02-26 DIAGNOSIS — Z886 Allergy status to analgesic agent status: Secondary | ICD-10-CM | POA: Insufficient documentation

## 2021-02-26 DIAGNOSIS — C480 Malignant neoplasm of retroperitoneum: Secondary | ICD-10-CM

## 2021-02-26 DIAGNOSIS — Z7901 Long term (current) use of anticoagulants: Secondary | ICD-10-CM | POA: Insufficient documentation

## 2021-02-26 DIAGNOSIS — Z5111 Encounter for antineoplastic chemotherapy: Secondary | ICD-10-CM | POA: Insufficient documentation

## 2021-02-26 DIAGNOSIS — Z79899 Other long term (current) drug therapy: Secondary | ICD-10-CM | POA: Diagnosis not present

## 2021-02-26 DIAGNOSIS — C7800 Secondary malignant neoplasm of unspecified lung: Secondary | ICD-10-CM | POA: Insufficient documentation

## 2021-02-26 LAB — CMP (CANCER CENTER ONLY)
ALT: 31 U/L (ref 0–44)
AST: 35 U/L (ref 15–41)
Albumin: 3.5 g/dL (ref 3.5–5.0)
Alkaline Phosphatase: 248 U/L — ABNORMAL HIGH (ref 38–126)
Anion gap: 7 (ref 5–15)
BUN: 24 mg/dL — ABNORMAL HIGH (ref 8–23)
CO2: 27 mmol/L (ref 22–32)
Calcium: 9 mg/dL (ref 8.9–10.3)
Chloride: 104 mmol/L (ref 98–111)
Creatinine: 1.24 mg/dL (ref 0.61–1.24)
GFR, Estimated: >60 mL/min (ref >60)
Glucose, Bld: 89 mg/dL (ref 70–99)
Potassium: 4.4 mmol/L (ref 3.5–5.1)
Sodium: 138 mmol/L (ref 135–145)
Total Bilirubin: 0.5 mg/dL (ref 0.3–1.2)
Total Protein: 6.3 g/dL — ABNORMAL LOW (ref 6.5–8.1)

## 2021-02-26 LAB — CBC WITH DIFFERENTIAL (CANCER CENTER ONLY)
Abs Immature Granulocytes: 0.02 10*3/uL (ref 0.00–0.07)
Basophils Absolute: 0 10*3/uL (ref 0.0–0.1)
Basophils Relative: 1 %
Eosinophils Absolute: 0.1 10*3/uL (ref 0.0–0.5)
Eosinophils Relative: 3 %
HCT: 35.3 % — ABNORMAL LOW (ref 39.0–52.0)
Hemoglobin: 12.3 g/dL — ABNORMAL LOW (ref 13.0–17.0)
Immature Granulocytes: 0 %
Lymphocytes Relative: 18 %
Lymphs Abs: 0.9 10*3/uL (ref 0.7–4.0)
MCH: 33.5 pg (ref 26.0–34.0)
MCHC: 34.8 g/dL (ref 30.0–36.0)
MCV: 96.2 fL (ref 80.0–100.0)
Monocytes Absolute: 0.5 10*3/uL (ref 0.1–1.0)
Monocytes Relative: 11 %
Neutro Abs: 3.3 10*3/uL (ref 1.7–7.7)
Neutrophils Relative %: 67 %
Platelet Count: 162 10*3/uL (ref 150–400)
RBC: 3.67 MIL/uL — ABNORMAL LOW (ref 4.22–5.81)
RDW: 12.9 % (ref 11.5–15.5)
WBC Count: 4.9 10*3/uL (ref 4.0–10.5)
nRBC: 0 % (ref 0.0–0.2)

## 2021-02-26 MED ORDER — DEXTROSE 5 % IV SOLN: Freq: Once | INTRAVENOUS | Status: AC

## 2021-02-26 MED ORDER — HEPARIN SOD (PORK) LOCK FLUSH 100 UNIT/ML IV SOLN
500.0000 [IU] | Freq: Once | INTRAVENOUS | Status: AC | PRN
  Administered 2021-02-26: 500 [IU]

## 2021-02-26 MED ORDER — DOXORUBICIN HCL LIPOSOMAL CHEMO INJECTION 2 MG/ML
20.0000 mg/m2 | Freq: Once | INTRAVENOUS | Status: AC
  Administered 2021-02-26: 40 mg via INTRAVENOUS
  Filled 2021-02-26: qty 20

## 2021-02-26 MED ORDER — SODIUM CHLORIDE 0.9% FLUSH
10.0000 mL | INTRAVENOUS | Status: DC | PRN
  Administered 2021-02-26: 10 mL

## 2021-02-26 MED ORDER — SODIUM CHLORIDE 0.9 % IV SOLN
10.0000 mg | Freq: Once | INTRAVENOUS | Status: AC
  Administered 2021-02-26: 10 mg via INTRAVENOUS
  Filled 2021-02-26: qty 10

## 2021-02-26 NOTE — Progress Notes (Signed)
Hematology and Oncology Follow Up Visit  William Avila [id number] [birth year] 75 y.o. 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   Prior Therapy: He underwent a radical resection by Dr. Barry Dienes on January 19, 2020.  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    He subsequently received adjuvant radiation therapy under the care of Dr. Lisbeth Renshaw completed on April 06, 2020.  He received a total dose of 48.6 Gray to the right abdomen.    Current therapy: Doxil 20 mg per metered square every 3 weeks started on February 07, 2021.  He is here for cycle 2 of therapy.  Interim History: [name] returns today for a follow-up visit.  Since the last visit, he received the first cycle of treatment without any complaints.  He denies any nausea, vomiting or infusion related complications.  He denies any skin rashes or lesions.  He remains active and continues to attempt activities of daily living.     Medications: I have reviewed the patient's current medications.  Current Outpatient Medications  Medication Sig Dispense Refill   acetaminophen (TYLENOL) 500 MG tablet Take 500-1,000 mg by mouth every 6 (six) hours as needed for mild pain.     apixaban (ELIQUIS) 5 MG TABS tablet TAKE 1 TABLET BY MOUTH TWICE A DAY 60 tablet 6   lidocaine-prilocaine (EMLA) cream Apply 1 application topically as needed. 30 g 0   lisinopril (ZESTRIL) 10 MG tablet Take 15 mg by mouth daily.     metoprolol succinate (TOPROL XL) 25 MG 24 hr tablet Take 1 tablet (25 mg total) by mouth daily. 90 tablet 3   Multiple Vitamins-Minerals (MULTIVITAMIN WITH MINERALS) tablet Take 1 tablet by mouth in the morning.     ondansetron (ZOFRAN-ODT) 8 MG disintegrating tablet Take 8 mg by  mouth every 8 (eight) hours as needed for nausea or vomiting.     Polyethyl Glycol-Propyl Glycol (SYSTANE) 0.4-0.3 % SOLN Place 1-2 drops into both eyes 3 (three) times daily as needed (dry/irritated eyes.).     predniSONE (DELTASONE) 50 MG tablet Pt to take 50 mg of prednisone on 01/17/21 at 8:40PM, 50 mg of prednisone on 01/18/21 at 02:40AM, and 50 mg of prednisone on 01/18/21 at 08:40AM. Pt is also to take 50 mg of benadryl on 01/18/21 at 8:40. Please call [phone] with any questions. 3 tablet 0   rosuvastatin (CRESTOR) 10 MG tablet Take 1 tablet (10 mg total) by mouth daily. 90 tablet 3   triamcinolone (KENALOG) 0.1 % Apply 1 application topically daily as needed (itching/irritation/rash).     No current facility-administered medications for this visit.   Facility-Administered Medications Ordered in Other Visits  Medication Dose Route Frequency Provider Last Rate Last Admin   dexamethasone (DECADRON) 10 mg in sodium chloride 0.9 % 50 mL IVPB  10 mg Intravenous Once [name], MD 204 mL/hr at 02/26/21 1207 10 mg at 02/26/21 1207   DOXOrubicin HCL LIPOSOMAL (DOXIL) 40 mg in dextrose 5 % 250 mL chemo infusion  20 mg/m2 (Treatment Plan Recorded) Intravenous Once [name], MD       heparin lock flush 100 unit/mL  500 Units Intracatheter Once PRN [name], MD       sodium chloride flush (NS) 0.9 % injection 10 mL  10 mL Intracatheter PRN [name]  N, MD         Allergies:  Allergies  Allergen Reactions   Codeine Anaphylaxis   Ibuprofen Anaphylaxis   Robaxin [Methocarbamol] Anaphylaxis, Hives, Swelling and Rash   Chlorhexidine Other (See Comments)    Unknown reaction   Contrast Media [Iodinated Diagnostic Agents] Itching and Other (See Comments)    Itching and bumps on skin.   Fentanyl And Related Other (See Comments)    Dry heaves (lasted for 14 hours)   Other Other (See Comments)    Nerve Block Tray: causes nausea/dry heaves   Tramadol Hcl Other (See Comments)     Changes personality, makes him angry     Physical Exam: Blood pressure (!) 135/93, pulse (!) 51, temperature (!) 97 F (36.1 C), temperature source Temporal, resp. rate 17, weight 168 lb (76.2 kg), SpO2 100 %. ECOG: 0 General appearance: alert and cooperative appeared without distress. Head: Normocephalic, without obvious abnormality Oropharynx: No oral thrush or ulcers. Eyes: No scleral icterus.  Pupils are equal and round reactive to light. Lymph nodes: Cervical, supraclavicular, and axillary nodes normal. Heart:regular rate and rhythm, S1, S2 normal, no murmur, click, rub or gallop Lung:chest clear, no wheezing, rales, normal symmetric air entry Abdomin: soft, non-tender, without masses or organomegaly. Neurological: No motor, sensory deficits.  Intact deep tendon reflexes. Skin: No rashes or lesions.  No ecchymosis or petechiae. Musculoskeletal: No joint deformity or effusion. Psychiatric: Mood and affect are appropriate.    Lab Results: Lab Results  Component Value Date   WBC 4.9 02/26/2021   HGB 12.3 (L) 02/26/2021   HCT 35.3 (L) 02/26/2021   MCV 96.2 02/26/2021   PLT 162 02/26/2021     Chemistry      Component Value Date/Time   NA 138 02/26/2021 1119   NA 138 08/23/2020 1042   K 4.4 02/26/2021 1119   CL 104 02/26/2021 1119   CO2 27 02/26/2021 1119   BUN 24 (H) 02/26/2021 1119   BUN 16 08/23/2020 1042   CREATININE 1.24 02/26/2021 1119      Component Value Date/Time   CALCIUM 9.0 02/26/2021 1119   ALKPHOS 248 (H) 02/26/2021 1119   AST 35 02/26/2021 1119   ALT 31 02/26/2021 1119   BILITOT 0.5 02/26/2021 1119          Impression and Plan:    75 year old with:  1.  Liposarcoma of the retroperitoneum diagnosed in 2021 with poorly differentiated tumor.  He developed pulmonary involvement in 2022.     Risks and benefits of continuing Doxil were reviewed at this time.  Complications that include nausea, vomiting, myelosuppression and hand-foot syndrome.   He is agreeable to proceed and the plan is to update his staging scans after cycle 4.       2.  IV access: Port-A-Cath inserted without any complications and will continue to be in use.   3.  Antiemetics: No nausea or vomiting reported at this time.  Zofran is available to him.   4.  Baseline cardiac evaluation: No signs or symptoms of heart failure at this time.  Baseline echo is within normal range.   5.  Goals of care: His disease is incurable although aggressive measures are warranted at this time.   6.  Follow-up: In 3 to 4 weeks for the next cycle of therapy.   60  minutes were spent on this encounter.  The time was dedicated to reviewing laboratory data, disease status update and outlining future plan of care.   Roxy Cedar  Alen Blew, MD 11/8/202212:16 PM

## 2021-02-26 NOTE — Progress Notes (Signed)
OK to treat with ECHO done in April per Dr Alen Blew.

## 2021-02-26 NOTE — Progress Notes (Signed)
Pt provided his proof of income to apply for the [employer] but he's overqualified for the grant.  I advised if he has changes in his income in the future to please contact me to reapply.

## 2021-02-26 NOTE — Patient Instructions (Signed)
[facility] ONCOLOGY  Discharge Instructions: Thank you for choosing [facility] to provide your oncology and hematology care.   If you have a lab appointment with the [facility], please go directly to the [facility] and check in at the registration area.   Wear comfortable clothing and clothing appropriate for easy access to any Portacath or PICC line.   We strive to give you quality time with your provider. You may need to reschedule your appointment if you arrive late (15 or more minutes).  Arriving late affects you and other patients whose appointments are after yours.  Also, if you miss three or more appointments without notifying the office, you may be dismissed from the clinic at the provider's discretion.      For prescription refill requests, have your pharmacy contact our office and allow 72 hours for refills to be completed.    Today you received the following chemotherapy and/or immunotherapy agents:  Liposomal Doxorubicin      To help prevent nausea and vomiting after your treatment, we encourage you to take your nausea medication as directed.  BELOW ARE SYMPTOMS THAT SHOULD BE REPORTED IMMEDIATELY: *FEVER GREATER THAN 100.4 F (38 C) OR HIGHER *CHILLS OR SWEATING *NAUSEA AND VOMITING THAT IS NOT CONTROLLED WITH YOUR NAUSEA MEDICATION *UNUSUAL SHORTNESS OF BREATH *UNUSUAL BRUISING OR BLEEDING *URINARY PROBLEMS (pain or burning when urinating, or frequent urination) *BOWEL PROBLEMS (unusual diarrhea, constipation, pain near the anus) TENDERNESS IN MOUTH AND THROAT WITH OR WITHOUT PRESENCE OF ULCERS (sore throat, sores in mouth, or a toothache) UNUSUAL RASH, SWELLING OR PAIN  UNUSUAL VAGINAL DISCHARGE OR ITCHING   Items with * indicate a potential emergency and should be followed up as soon as possible or go to the Emergency Department if any problems should occur.  Please show the CHEMOTHERAPY ALERT CARD or IMMUNOTHERAPY ALERT  CARD at check-in to the Emergency Department and triage nurse.  Should you have questions after your visit or need to cancel or reschedule your appointment, please contact [facility]  Dept: [phone]  and follow the prompts.  Office hours are 8:00 a.m. to 4:30 p.m. Monday - Friday. Please note that voicemails left after 4:00 p.m. may not be returned until the following business day.  We are closed weekends and major holidays. You have access to a nurse at all times for urgent questions. Please call the main number to the clinic Dept: [phone] and follow the prompts.   For any non-urgent questions, you may also contact your provider using MyChart. We now offer e-Visits for anyone 43 and older to request care online for non-urgent symptoms. For details visit [email or web address].   Also download the MyChart app! Go to the app store, search "MyChart", open the app, select [facility], and log in with your MyChart username and password.  Due to Covid, a mask is required upon entering the hospital/clinic. If you do not have a mask, one will be given to you upon arrival. For doctor visits, patients may have 1 support person aged 17 or older with them. For treatment visits, patients cannot have anyone with them due to current Covid guidelines and our immunocompromised population.

## 2021-03-19 NOTE — Progress Notes (Signed)
Cardiology Office Note:    Date:  03/20/2021   ID:  William Avila, DOB Jan 20, 1946, MRN [id number]  PCP:  [name], MD  Cardiologist:  None  Electrophysiologist:  [name], MD   Referring MD: [name], MD   Chief Complaint  Patient presents with   Follow-up    3 months.   Headache   Chest Pain     History of Present Illness:    William Avila is a 75 y.o. male with a hx of retroperitoneal liposarcoma, SVT, persistent atrial fibrillation, tachybrady syndrome s/p PPM who presents for follow-up.  He was seen in the ED on 05/30/2019 after having a surgical procedure on his arm and having runs of tachycardia following this.  Telemetry in the ED showed frequent episodes of narrow complex tachycardia, with rates up to 150s.  Appeared to be ectopic P wave during episodes suggesting atrial tachycardia as the cause.  He was discharged on metoprolol but did not take.   TTE on 07/06/2019 showed EF 65 to 70%, moderate LVH, grade 1 diastolic dysfunction, normal RV function, moderate dilatation of the ascending aorta measuring 44 mm.  CMR with MRA was recommended to work-up LVH and aortic dilatation.    CMR on 09/14/2019 showed mild asymmetric hypertrophy measuring 13 mm and basal septum not meeting criteria for HCM, no LGE, LV EF 61%, RVEF 63%, dilated ascending aorta measuring 41 mm.  Zio patch x14 days showed 485 episodes of SVT, longest lasting nearly 5 minutes, occasional PVCs (2% of beats).  Reported exertional chest pain, Lexiscan Myoview was done on 11/16/2019 which showed no ischemia, attenuation artifact, LVEF 56%.  Continue to have chest pain underwent coronary CTA on 05/10/2020, which showed calcium score 200 (47 percentile), respiratory motion artifact but no clear obstructive disease.  Zio patch on 05/16/2020 showed 577 episodes of SVT, longest lasting 14 minutes.  Given symptomatic tachybrady syndrome, was referred to EP and underwent PPM placement on 06/20/2020.  Subsequently  presented to ED with chest pain on 06/26/2020 found to have lead perforation with large pericardial effusion.  He underwent lead revision on 06/26/2020.  Repeat echo on 07/06/2020 showed normal biventricular function, trivial to small effusion.  Device interrogation showed atrial fibrillation.  He started Eliquis on 3/30 and repeat echo on 4/6 no pericardial effusion.  Underwent successful cardioversion for atrial fibrillation on 08/29/2020.  Since last clinic visit, he has started chemotherapy for metastatic liposarcoma.  Reports had episode of right-sided chest pain recently, lasted for about 20 minutes.  Occurred at rest, is worse with deep breathing.  No pain since that time.  Denies any dyspnea, lightheadedness, syncope, palpitations, or lower extremity edema.  Has been taking Eliquis, denies any bleeding issues.  Home BP has been 100s to 130s over 70s to 80s.   Wt Readings from Last 3 Encounters:  03/20/21 166 lb (75.3 kg)  02/26/21 168 lb (76.2 kg)  02/07/21 166 lb (75.3 kg)     Past Medical History:  Diagnosis Date   Acute appendicitis 01/21/2014   Arthritis    Dysrhythmia    PSVT, bradycardia   History of hiatal hernia    Hypertension    Presence of permanent cardiac pacemaker     Past Surgical History:  Procedure Laterality Date   BACK SURGERY     and neck and trigger finger surgery   CARDIOVERSION N/A 08/29/2020   Procedure: CARDIOVERSION;  Surgeon: Sanda Klein, MD;  Location: [facility];  Service: Cardiovascular;  Laterality: N/A;  CERVICAL SPINE SURGERY     ESOPHAGOGASTRODUODENOSCOPY (EGD) WITH PROPOFOL Left 12/24/2015   Procedure: ESOPHAGOGASTRODUODENOSCOPY (EGD) WITH PROPOFOL;  Surgeon: Otis Brace, MD;  Location: [facility];  Service: Gastroenterology;  Laterality: Left;   HIATAL HERNIA REPAIR N/A 12/26/2015   Procedure: LAPAROSCOPIC REPAIR OF HIATAL HERNIA WITH MESH;  Surgeon: Michael Boston, MD;  Location: [facility];  Service: General;  Laterality: N/A;   INGUINAL  HERNIA REPAIR Left 01/19/2020   Procedure: LEFT INGUINAL HERNIA REPAIR WITH MESH;  Surgeon: Stark Klein, MD;  Location: [facility];  Service: General;  Laterality: Left;   INSERTION OF MESH Left 01/19/2020   Procedure: INSERTION OF MESH;  Surgeon: Stark Klein, MD;  Location: [facility];  Service: General;  Laterality: Left;   IR IMAGING GUIDED PORT INSERTION  02/05/2021   LAPAROSCOPIC APPENDECTOMY N/A 01/21/2014   Procedure: APPENDECTOMY LAPAROSCOPIC;  Surgeon: Autumn Messing III, MD;  Location: [facility];  Service: General;  Laterality: N/A;   LEAD REVISION/REPAIR N/A 06/26/2020   Procedure: LEAD REVISION/REPAIR;  Surgeon: [name], MD;  Location: [facility];  Service: Cardiovascular;  Laterality: N/A;   PACEMAKER IMPLANT N/A 06/20/2020   Procedure: PACEMAKER IMPLANT;  Surgeon: Evans Lance, MD;  Location: [facility];  Service: Cardiovascular;  Laterality: N/A;   RESECTION OF RETROPERITONEAL MASS N/A 01/19/2020   Procedure: 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;  Surgeon: Stark Klein, MD;  Location: [facility];  Service: General;  Laterality: N/A;   TRIGGER FINGER RELEASE Left 05/2019    Current Medications: Current Meds  Medication Sig   acetaminophen (TYLENOL) 500 MG tablet Take 500-1,000 mg by mouth every 6 (six) hours as needed for mild pain.   apixaban (ELIQUIS) 5 MG TABS tablet TAKE 1 TABLET BY MOUTH TWICE A DAY   lidocaine-prilocaine (EMLA) cream Apply 1 application topically as needed.   lisinopril (ZESTRIL) 10 MG tablet Take 15 mg by mouth daily.   metoprolol succinate (TOPROL XL) 25 MG 24 hr tablet Take 1 tablet (25 mg total) by mouth daily.   Multiple Vitamins-Minerals (MULTIVITAMIN WITH MINERALS) tablet Take 1 tablet by mouth in the morning.   ondansetron (ZOFRAN-ODT) 8 MG disintegrating tablet Take 8 mg by mouth every 8 (eight) hours as needed for nausea or vomiting.   Polyethyl Glycol-Propyl Glycol (SYSTANE) 0.4-0.3 % SOLN Place 1-2 drops into both eyes 3  (three) times daily as needed (dry/irritated eyes.).   predniSONE (DELTASONE) 50 MG tablet Pt to take 50 mg of prednisone on 01/17/21 at 8:40PM, 50 mg of prednisone on 01/18/21 at 02:40AM, and 50 mg of prednisone on 01/18/21 at 08:40AM. Pt is also to take 50 mg of benadryl on 01/18/21 at 8:40. Please call [phone] with any questions.   triamcinolone (KENALOG) 0.1 % Apply 1 application topically daily as needed (itching/irritation/rash).     Allergies:   Codeine, Ibuprofen, Robaxin [methocarbamol], Chlorhexidine, Contrast media [iodinated diagnostic agents], Fentanyl and related, Other, and Tramadol hcl   Social History   Socioeconomic History   Marital status: Married    Spouse name: Not on file   Number of children: Not on file   Years of education: Not on file   Highest education level: Not on file  Occupational History   Not on file  Tobacco Use   Smoking status: Never   Smokeless tobacco: Never  Vaping Use   Vaping Use: Never used  Substance and Sexual Activity   Alcohol use: Yes    Comment: Occassionally   Drug use:  No   Sexual activity: Not on file  Other Topics Concern   Not on file  Social History Narrative   Not on file   Social Determinants of Health   Financial Resource Strain: Not on file  Food Insecurity: Not on file  Transportation Needs: Not on file  Physical Activity: Not on file  Stress: Not on file  Social Connections: Not on file     Family History: No relevant family history   ROS:   Please see the history of present illness.    (+) Fatigue (+) Lightheadedness All other systems reviewed and are negative.  EKGs/Labs/Other Studies Reviewed:    The following studies were reviewed today:   EKG:   06/23/19: Sinus rhythm, rate 52, no ST/T abnormalities 10/26/19: EKG was not ordered. 01/09/20: Sinus rhythm, rate 53, no ST/T abnormalities 04/25/20: Sinus rhythm, rate 44, no ST/T abnormalities 05/21/20: sinus rhythm, rate 50, no ST/T  abnormalities 08/23/20: V paced, A. fib, rate 70 03/20/21: A-paced, rate 57  Recent Labs: 06/21/2020: B Natriuretic Peptide 115.6 02/26/2021: ALT 31; BUN 24; Creatinine 1.24; Hemoglobin 12.3; Platelet Count 162; Potassium 4.4; Sodium 138  Recent Lipid Panel    Component Value Date/Time   CHOL 161 05/29/2020 0945   TRIG 56 05/29/2020 0945   HDL 74 05/29/2020 0945   CHOLHDL 2.2 05/29/2020 0945   VLDL 11 05/29/2020 0945   LDLCALC 76 05/29/2020 0945     Lexiscan Myoview 06/09/18: Nuclear stress EF: 62%. No T wave inversion was noted during stress. There was no ST segment deviation noted during stress. This is a low risk study.   No significant reversible ischemia. LVEF 62% with normal wall motion. This is a low risk study.  Physical Exam:    VS:  BP 110/72 (BP Location: Left Arm, Patient Position: Sitting, Cuff Size: Normal)   Pulse (!) 57   Ht 5\' 11"  (1.803 m)   Wt 166 lb (75.3 kg)   BMI 23.15 kg/m     Wt Readings from Last 3 Encounters:  03/20/21 166 lb (75.3 kg)  02/26/21 168 lb (76.2 kg)  02/07/21 166 lb (75.3 kg)     GEN:  Well nourished, well developed in no acute distress HEENT: Normal NECK: No JVD CARDIAC: RRR, 2/6 systolic heart murmur RESPIRATORY:  Clear to auscultation without rales, wheezing or rhonchi  ABDOMEN: Soft, non-tender, non-distended MUSCULOSKELETAL:  No edema; No deformity  SKIN: Warm and dry NEUROLOGIC:  Alert and oriented x 3 PSYCHIATRIC:  Normal affect   ASSESSMENT:    1. Persistent atrial fibrillation (HCC)   2. Chest pain of uncertain etiology   3. Sick sinus syndrome ([place])   4. Pacemaker   5. Liposarcoma ([place])   6. Aortic dilatation (HCC)   7. Essential hypertension     PLAN:    Persistent atrial fibrillation: CHA2DS2-VASc 2 (hypertension, age).  Underwent successful cardioversion for atrial fibrillation on 08/29/2020. -Continue Eliquis 5 mg twice daily -Continue Toprol-XL 25 mg daily   Liposarcoma: Of retroperitoneum, diagnosed  in 2021.  Underwent resection 12/2019.  Found to have metastatic disease to lung in 12/2020.  Currently on chemotherapy.  -Check echo to monitor systolic function  Chest pain:  Reported chest pain, Lexiscan Myoview was done on 11/16/2019 which showed no ischemia, attenuation artifact, LVEF 56%.  Continue to have chest pain so underwent coronary CTA on 05/10/2020, which showed calcium score 200 (47 percentile), respiratory motion artifact but no clear obstructive disease, and with recent normal Lexiscan, no further work-up recommended  at this time.   -Continue rosuvastatin 10 mg daily  Tachybrady syndrome: Frequent episodes SVT on Zio in March 2021.  Unable to tolerate low dose metoprolol as resting heart rate in 40s to 50s.  Reported lightheadedness with SVT episodes.  Referred to EP, seen by Dr. Lovena Le.  Zio patch x 7 days on 05/16/2020 showed 577 episodes of SVT, longest lasting 14 minutes.  Given symptomatic tachybrady syndrome, underwent PPM placement on 06/20/2020.  Subsequently presented to ED with chest pain on 06/26/2020 found to have lead perforation with large pericardial effusion.  He underwent lead revision on 06/26/2020.  Repeat echo on 07/06/2020 showed normal biventricular function, trivial to small effusion.  Device interrogation showed atrial fibrillation.  He started Eliquis on 07/18/20 and repeat echo on 07/25/20 no pericardial effusion. -Continue Toprol-XL 25 mg daily -Continue EP follow-up  LVH: CMR on 09/14/2019 showed mild asymmetric hypertrophy measuring 13 mm and basal septum not meeting criteria for HCM, no LGE   Aortic dilatation: dilated ascending aorta measuring 41 mm on MRA 08/2019.  Will monitor  Hypertension: On lisinopril 15 mg daily.  Appears controlled   RTC in 6 months  Medication Adjustments/Labs and Tests Ordered: Current medicines are reviewed at length with the patient today.  Concerns regarding medicines are outlined above.  No orders of the defined types were placed in  this encounter.  No orders of the defined types were placed in this encounter.   Patient Instructions  Medication Instructions:  Your physician recommends that you continue on your current medications as directed. Please refer to the Current Medication list given to you today.  *If you need a refill on your cardiac medications before your next appointment, please call your pharmacy*  Testing/Procedures: Your physician has requested that you have an echocardiogram. Echocardiography is a painless test that uses sound waves to create images of your heart. It provides your doctor with information about the size and shape of your heart and how well your heart's chambers and valves are working. This procedure takes approximately one hour. There are no restrictions for this procedure.  Follow-Up: At [facility], you and your health needs are our priority.  As part of our continuing mission to provide you with exceptional heart care, we have created designated Provider Care Teams.  These Care Teams include your primary Cardiologist (physician) and Advanced Practice Providers (APPs -  Physician Assistants and Nurse Practitioners) who all work together to provide you with the care you need, when you need it.  We recommend signing up for the patient portal called "MyChart".  Sign up information is provided on this After Visit Summary.  MyChart is used to connect with patients for Virtual Visits (Telemedicine).  Patients are able to view lab/test results, encounter notes, upcoming appointments, etc.  Non-urgent messages can be sent to your provider as well.   To learn more about what you can do with MyChart, go to [email or web address].    Your next appointment:   6 month(s)  The format for your next appointment:   In Person  Provider:   Dr. Bruna Potter [place] as a scribe for Donato Heinz, MD.,have documented all relevant documentation on the behalf of Donato Heinz, MD,as directed by  Donato Heinz, MD while in the presence of Donato Heinz, MD.  I, Donato Heinz, MD, have reviewed all documentation for this visit. The documentation on 03/20/21 for the exam, diagnosis, procedures, and orders are all accurate and complete.  Signed, Donato Heinz, MD  03/20/2021 9:29 AM    [facility]

## 2021-03-20 ENCOUNTER — Other Ambulatory Visit: Payer: Self-pay

## 2021-03-20 ENCOUNTER — Ambulatory Visit: Payer: Medicare HMO | Admitting: Cardiology

## 2021-03-20 ENCOUNTER — Encounter: Payer: Self-pay | Admitting: Cardiology

## 2021-03-20 VITALS — BP 110/72 | HR 57 | Ht 71.0 in | Wt 166.0 lb

## 2021-03-20 DIAGNOSIS — I4819 Other persistent atrial fibrillation: Secondary | ICD-10-CM

## 2021-03-20 DIAGNOSIS — I10 Essential (primary) hypertension: Secondary | ICD-10-CM

## 2021-03-20 DIAGNOSIS — Z950 Presence of cardiac pacemaker: Secondary | ICD-10-CM

## 2021-03-20 DIAGNOSIS — I77819 Aortic ectasia, unspecified site: Secondary | ICD-10-CM

## 2021-03-20 DIAGNOSIS — C499 Malignant neoplasm of connective and soft tissue, unspecified: Secondary | ICD-10-CM

## 2021-03-20 DIAGNOSIS — R079 Chest pain, unspecified: Secondary | ICD-10-CM | POA: Diagnosis not present

## 2021-03-20 DIAGNOSIS — I495 Sick sinus syndrome: Secondary | ICD-10-CM

## 2021-03-20 NOTE — Patient Instructions (Signed)
Medication Instructions:  Your physician recommends that you continue on your current medications as directed. Please refer to the Current Medication list given to you today.  *If you need a refill on your cardiac medications before your next appointment, please call your pharmacy*  Testing/Procedures: Your physician has requested that you have an echocardiogram. Echocardiography is a painless test that uses sound waves to create images of your heart. It provides your doctor with information about the size and shape of your heart and how well your heart's chambers and valves are working. This procedure takes approximately one hour. There are no restrictions for this procedure.  Follow-Up: At [facility], you and your health needs are our priority.  As part of our continuing mission to provide you with exceptional heart care, we have created designated Provider Care Teams.  These Care Teams include your primary Cardiologist (physician) and Advanced Practice Providers (APPs -  Physician Assistants and Nurse Practitioners) who all work together to provide you with the care you need, when you need it.  We recommend signing up for the patient portal called "MyChart".  Sign up information is provided on this After Visit Summary.  MyChart is used to connect with patients for Virtual Visits (Telemedicine).  Patients are able to view lab/test results, encounter notes, upcoming appointments, etc.  Non-urgent messages can be sent to your provider as well.   To learn more about what you can do with MyChart, go to [email or web address].    Your next appointment:   6 month(s)  The format for your next appointment:   In Person  Provider:   Dr. Gardiner Rhyme

## 2021-03-21 ENCOUNTER — Ambulatory Visit (INDEPENDENT_AMBULATORY_CARE_PROVIDER_SITE_OTHER): Payer: Medicare HMO

## 2021-03-21 DIAGNOSIS — I495 Sick sinus syndrome: Secondary | ICD-10-CM

## 2021-03-21 LAB — CUP PACEART REMOTE DEVICE CHECK
Battery Remaining Longevity: 116 mo
Battery Remaining Percentage: 94 %
Battery Voltage: 3.02 V
Brady Statistic AP VP Percent: 1 %
Brady Statistic AP VS Percent: 60 %
Brady Statistic AS VP Percent: 1 %
Brady Statistic AS VS Percent: 39 %
Brady Statistic RA Percent Paced: 57 %
Brady Statistic RV Percent Paced: 1 %
Date Time Interrogation Session: 20221201020015
Implantable Lead Implant Date: 20220302
Implantable Lead Implant Date: 20220302
Implantable Lead Location: 753859
Implantable Lead Location: 753860
Implantable Pulse Generator Implant Date: 20220302
Lead Channel Impedance Value: 390 Ohm
Lead Channel Impedance Value: 410 Ohm
Lead Channel Pacing Threshold Amplitude: 0.5 V
Lead Channel Pacing Threshold Amplitude: 1 V
Lead Channel Pacing Threshold Pulse Width: 0.4 ms
Lead Channel Pacing Threshold Pulse Width: 0.5 ms
Lead Channel Sensing Intrinsic Amplitude: 5 mV
Lead Channel Sensing Intrinsic Amplitude: 9.3 mV
Lead Channel Setting Pacing Amplitude: 2 V
Lead Channel Setting Pacing Amplitude: 2.5 V
Lead Channel Setting Pacing Pulse Width: 0.5 ms
Lead Channel Setting Sensing Sensitivity: 2 mV
Pulse Gen Model: 2272
Pulse Gen Serial Number: 6376316

## 2021-03-26 ENCOUNTER — Telehealth: Payer: Self-pay | Admitting: *Deleted

## 2021-03-26 MED FILL — Dexamethasone Sodium Phosphate Inj 100 MG/10ML: INTRAMUSCULAR | Qty: 1 | Status: AC

## 2021-03-26 NOTE — Telephone Encounter (Signed)
Returned PC to patient, he tested positive for covid yesterday & he has appointments scheduled here tomorrow - informed him our policy is that his appointments be rescheduled in 21 days, he already has appointments on 04/18/21, which will be 3 weeks.  Instructed patient to keep 12/29 appointments, he verbalizes understanding.

## 2021-03-27 ENCOUNTER — Inpatient Hospital Stay: Payer: Medicare HMO

## 2021-03-27 ENCOUNTER — Inpatient Hospital Stay: Payer: Medicare HMO | Admitting: Oncology

## 2021-04-01 NOTE — Progress Notes (Signed)
Remote pacemaker transmission.   

## 2021-04-04 DIAGNOSIS — M19032 Primary osteoarthritis, left wrist: Secondary | ICD-10-CM | POA: Diagnosis not present

## 2021-04-04 DIAGNOSIS — I10 Essential (primary) hypertension: Secondary | ICD-10-CM | POA: Diagnosis not present

## 2021-04-04 DIAGNOSIS — E7800 Pure hypercholesterolemia, unspecified: Secondary | ICD-10-CM | POA: Diagnosis not present

## 2021-04-10 ENCOUNTER — Ambulatory Visit (HOSPITAL_COMMUNITY): Payer: Medicare HMO | Attending: Cardiology

## 2021-04-10 ENCOUNTER — Other Ambulatory Visit: Payer: Self-pay

## 2021-04-10 DIAGNOSIS — I4819 Other persistent atrial fibrillation: Secondary | ICD-10-CM

## 2021-04-10 LAB — ECHOCARDIOGRAM COMPLETE
Area-P 1/2: 4.11 cm2
S' Lateral: 3.1 cm

## 2021-04-17 NOTE — Progress Notes (Signed)
[facility] up:    William Orn, MD [address] [place] [address]   DIAGNOSIS:  Cancer Staging  No matching staging information was found for the patient.  SUMMARY OF ONCOLOGIC HISTORY: Oncology History  Liposarcoma, retroperitoneal ([place])  12/23/2015 Initial Diagnosis   Liposarcoma, retroperitoneal ([place])   01/19/2020 Surgery   Radical resection completed by Dr. Barry Dienes on January 19, 2020.  If final pathology showed dedifferentiated liposarcoma that William high-grade with osteosarcoma differentiation.     02/27/2020 - 04/06/2020 Radiation Therapy   The right abdomen was treated initially to a dose of 45 Gy in 25 fractions using a 4-field IMRT plan. A 3.6 Gy boost in 3 fractions was then delivered yielding a total does of 48.6 Gy.   01/18/2021 Imaging   CT chest notes widespread metastatic disease to bilateral lungs.  The largest mass in the left lung William located in the lower lobe and measures 5.7 x 5.1 cm.  The largest mass in the right lung William in the posterior right upper lobe measuring 6 x 3.8 cm there are several other smaller lesions noted.  No airspace disease or pleural effusions were noted.  No disease was noted to the abdomen and pelvis.  Incidental findings were noted and are within the report and [facility]   02/07/2021 -  Chemotherapy   Patient William on Treatment Plan : SARCOMA Liposomal Doxorubicin q21d       CURRENT THERAPY: Doxil  INTERVAL HISTORY: William Avila 75 y.o. male returns for follow-up and evaluation prior to receiving Doxil chemotherapy.  He William accompanied by his wife.  He underwent an echocardiogram on April 10, 2021.  This showed a preserved ejection fraction at 60 to 65% and normal global longitudinal strain.  Aum William doing quite well today.  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  He says that he William feeling fine and denies any significant chest pain palpitation  shortness of breath nausea vomiting diarrhea.  He has had some very mild itching on his bilateral posterior shoulders.  There are a couple of "bumps" that have come up.  He has been applying triamcinolone cream which he received from his primary care provider for a previous rash that was similar to this in the same location.  He denies any rash anywhere else.  He and his wife have a few questions about his imaging and pathology.   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  MEDICAL HISTORY: Past Medical History:  Diagnosis Date   Acute appendicitis 01/21/2014   Arthritis    Dysrhythmia    PSVT, bradycardia   History of hiatal hernia    Hypertension    Presence of permanent cardiac pacemaker     SURGICAL HISTORY: Past Surgical History:  Procedure Laterality Date   BACK SURGERY  and neck and trigger finger surgery   CARDIOVERSION N/A 08/29/2020   Procedure: CARDIOVERSION;  Surgeon: Sanda Klein, MD;  Location: [facility];  Service: Cardiovascular;  Laterality: N/A;   CERVICAL SPINE SURGERY     ESOPHAGOGASTRODUODENOSCOPY (EGD) WITH PROPOFOL Left 12/24/2015   Procedure: ESOPHAGOGASTRODUODENOSCOPY (EGD) WITH PROPOFOL;  Surgeon: Otis Brace, MD;  Location: [facility];  Service: Gastroenterology;  Laterality: Left;   HIATAL HERNIA REPAIR N/A 12/26/2015   Procedure: LAPAROSCOPIC REPAIR OF HIATAL HERNIA WITH MESH;  Surgeon: Michael Boston, MD;  Location: [facility];  Service: General;  Laterality: N/A;   INGUINAL HERNIA REPAIR Left 01/19/2020   Procedure: LEFT INGUINAL HERNIA REPAIR WITH MESH;  Surgeon: Stark Klein, MD;  Location: [facility];  Service: General;  Laterality: Left;   INSERTION OF MESH Left 01/19/2020   Procedure: INSERTION OF MESH;  Surgeon: Stark Klein, MD;  Location: [facility];  Service: General;  Laterality: Left;   IR IMAGING GUIDED PORT INSERTION  02/05/2021   LAPAROSCOPIC APPENDECTOMY N/A 01/21/2014   Procedure: APPENDECTOMY LAPAROSCOPIC;  Surgeon: Autumn Messing III, MD;  Location: [facility];  Service: General;  Laterality: N/A;   LEAD REVISION/REPAIR N/A 06/26/2020   Procedure: LEAD REVISION/REPAIR;  Surgeon: [name], MD;  Location: [facility];  Service: Cardiovascular;  Laterality: N/A;   PACEMAKER IMPLANT N/A 06/20/2020   Procedure: PACEMAKER IMPLANT;  Surgeon: Evans Lance, MD;  Location: [facility];  Service: Cardiovascular;  Laterality: N/A;   RESECTION OF RETROPERITONEAL MASS N/A 01/19/2020   Procedure: 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;  Surgeon: Stark Klein, MD;  Location: [facility];  Service: General;  Laterality: N/A;   TRIGGER FINGER RELEASE Left 05/2019    SOCIAL HISTORY: Social History   Socioeconomic History   Marital status: Married    Spouse name: Not on file   Number of children: Not on file   Years of education: Not on file   Highest education level: Not on file  Occupational History   Not on file  Tobacco Use   Smoking status: Never   Smokeless tobacco: Never  Vaping Use   Vaping Use: Never used  Substance and Sexual Activity   Alcohol use: Yes    Comment: Occassionally   Drug use: No   Sexual activity: Not on file  Other Topics Concern   Not on file  Social History  Narrative   Not on file   Social Determinants of Health   Financial Resource Strain: Not on file  Food Insecurity: Not on file  Transportation Needs: Not on file  Physical Activity: Not on file  Stress: Not on file  Social Connections: Not on file  Intimate Partner Violence: Not on file    FAMILY HISTORY: Family History  Problem Relation Age of Onset   Prostate cancer Brother    Cancer Paternal Uncle        Unknown what type    Review of Systems  Constitutional:  Negative for appetite change, chills, fatigue, fever and unexpected weight change.  HENT:   Negative for hearing loss, lump/mass and trouble swallowing.   Eyes:  Negative for eye problems and icterus.  Respiratory:  Negative for chest tightness, cough and shortness of breath.   Cardiovascular:  Negative for chest pain, leg swelling and palpitations.  Gastrointestinal:  Negative for abdominal distention, abdominal pain, constipation, diarrhea, nausea and vomiting.  Endocrine: Negative for hot flashes.  Genitourinary:  Negative for difficulty urinating.   Musculoskeletal:  Negative for arthralgias.  Skin:  Positive for rash (As per interval history). Negative for itching.  Neurological:  Negative for dizziness, extremity weakness, headaches and numbness.  Hematological:  Negative for adenopathy. Does not bruise/bleed easily.  Psychiatric/Behavioral:  Negative for depression. The patient William not nervous/anxious.      PHYSICAL EXAMINATION  ECOG PERFORMANCE STATUS: 1  Vitals:   04/18/21 0932  BP: (!) 143/79  Pulse: (!) 50  Resp: 18  Temp: (!) 97.1 F (36.2 C)  SpO2: 98%    Physical Exam Constitutional:      General: He William not in acute distress.    Appearance: Normal appearance. He William not toxic-appearing.  HENT:     Head: Normocephalic and atraumatic.  Eyes:     General: No scleral icterus. Cardiovascular:     Rate and Rhythm: Normal rate and regular rhythm.     Pulses: Normal pulses.     Heart sounds:  Normal heart sounds.  Pulmonary:     Effort: Pulmonary effort William normal.     Breath sounds: Normal breath sounds.  Abdominal:     General: Abdomen William flat. Bowel sounds are normal. There William no distension.     Palpations: Abdomen William soft.     Tenderness: There William no abdominal tenderness.  Musculoskeletal:        General: No swelling.     Cervical back: Neck supple.  Lymphadenopathy:     Cervical: No cervical adenopathy.  Skin:    General: Skin William warm and dry.     Findings: No rash.     Comments: On johnnies posterior shoulders the skin William very mildly dry.  There are 2 very small approximately 4 mm papular lesions on each of his posterior bilateral shoulders these are scabbed over likely due to scratching.  It William difficult to make out what they could be considering the scabbing.  Neurological:     General: No focal deficit present.     Mental Status: He William alert.  Psychiatric:        Mood and Affect: Mood normal.        Behavior: Behavior normal.    LABORATORY DATA:  CBC    Component Value Date/Time   WBC 5.0 04/18/2021 0915   WBC 6.0 06/26/2020 1137   RBC 3.66 (L) 04/18/2021 0915   HGB 12.0 (L) 04/18/2021 0915   HGB 14.0 08/23/2020 1042   HCT 35.4 (L) 04/18/2021 0915   HCT 42.0 08/23/2020 1042   PLT 170 04/18/2021 0915   PLT 172 08/23/2020 1042   MCV 96.7 04/18/2021 0915   MCV 100 (H) 08/23/2020 1042   MCH 32.8 04/18/2021 0915   MCHC 33.9 04/18/2021 0915   RDW 13.3 04/18/2021 0915   RDW 13.2 08/23/2020 1042   LYMPHSABS 0.9 04/18/2021 0915   MONOABS 0.6 04/18/2021 0915   EOSABS 0.4 04/18/2021 0915   BASOSABS 0.0 04/18/2021 0915    CMP     Component Value Date/Time   NA 139 04/18/2021 0915   NA 138 08/23/2020 1042   K 4.3 04/18/2021 0915   CL 105 04/18/2021 0915   CO2 29 04/18/2021 0915   GLUCOSE 95 04/18/2021 0915   BUN 18 04/18/2021 0915   BUN 16 08/23/2020 1042   CREATININE 1.32 (H) 04/18/2021 0915   CALCIUM 9.1 04/18/2021 0915   PROT 6.2 (L)  04/18/2021 0915   ALBUMIN 3.6 04/18/2021 0915   AST 32 04/18/2021 0915   ALT 25 04/18/2021 0915   ALKPHOS 244 (H) 04/18/2021 0915  BILITOT 0.5 04/18/2021 0915   GFRNONAA 56 (L) 04/18/2021 0915   GFRAA 62 05/03/2020 1213        ASSESSMENT and THERAPY PLAN:   Liposarcoma, retroperitoneal (HCC) Avila William a 75 year old man who William here today for evaluation prior to Doxil chemotherapy for his metastatic liposarcoma.  1.  Metastatic liposarcoma: He continues on treatment with Doxil.  Today he will receive cycle 3.  They had several questions about his cancer and pathology.  I reviewed with Lavaughn and his wife the images from his CT scan showing them where the different areas of concern were.  I also discussed the pathology results in the grade of the tumor.  He William due for restaging after 4 cycles.  I placed orders for this to occur after his next cycle.  2.  Skin rash: It William unclear what this William.  It does not appear to be related to the chemotherapy.  I suggested that he get Cetaphil cream to use instead of lotion, also I refilled his triamcinolone cream to apply.  Since [place] and [place] I showed them how to send a picture through MyChart if the rash worsens and they need help or advice on its management.  K[place]ne will return in 3 weeks for labs, follow-up, and his next treatment.  At their request I went ahead and scheduled few more cycles so they could tentatively plan his treatment day since his wife William working.  They understand this relies on scan results.    All questions were answered. The patient knows to call the clinic with any problems, questions or concerns. We can certainly see the patient much sooner if necessary.  Total encounter time: 30 minutes in face-to-face visit time, chart review, lab review, care coordination, and documentation of the encounter.  Wilber Bihari, NP 04/18/21 10:31 AM Medical Oncology and Hematology [facility] [place], [place] [address] Tel. [phone]    Fax. [phone]  *Total Encounter Time as defined by the Centers for Medicare and Medicaid Services includes, in addition to the face-to-face time of a patient visit (documented in the note above) non-face-to-face time: obtaining and reviewing outside history, ordering and reviewing medications, tests or procedures, care coordination (communications with other health care professionals or caregivers) and documentation in the medical record.

## 2021-04-18 ENCOUNTER — Ambulatory Visit: Payer: Medicare HMO | Admitting: Physician Assistant

## 2021-04-18 ENCOUNTER — Other Ambulatory Visit: Payer: Medicare HMO

## 2021-04-18 ENCOUNTER — Inpatient Hospital Stay (HOSPITAL_BASED_OUTPATIENT_CLINIC_OR_DEPARTMENT_OTHER): Payer: Medicare HMO | Admitting: Adult Health

## 2021-04-18 ENCOUNTER — Inpatient Hospital Stay: Payer: Medicare HMO | Attending: Oncology

## 2021-04-18 ENCOUNTER — Ambulatory Visit: Payer: Medicare HMO

## 2021-04-18 ENCOUNTER — Encounter: Payer: Self-pay | Admitting: Adult Health

## 2021-04-18 ENCOUNTER — Other Ambulatory Visit: Payer: Self-pay

## 2021-04-18 ENCOUNTER — Inpatient Hospital Stay: Payer: Medicare HMO

## 2021-04-18 ENCOUNTER — Other Ambulatory Visit: Payer: Self-pay | Admitting: Oncology

## 2021-04-18 VITALS — BP 143/79 | HR 50 | Temp 97.1°F | Resp 18 | Wt 164.4 lb

## 2021-04-18 DIAGNOSIS — C7802 Secondary malignant neoplasm of left lung: Secondary | ICD-10-CM | POA: Insufficient documentation

## 2021-04-18 DIAGNOSIS — Z79899 Other long term (current) drug therapy: Secondary | ICD-10-CM | POA: Insufficient documentation

## 2021-04-18 DIAGNOSIS — Z8042 Family history of malignant neoplasm of prostate: Secondary | ICD-10-CM | POA: Diagnosis not present

## 2021-04-18 DIAGNOSIS — Z809 Family history of malignant neoplasm, unspecified: Secondary | ICD-10-CM | POA: Insufficient documentation

## 2021-04-18 DIAGNOSIS — Z95828 Presence of other vascular implants and grafts: Secondary | ICD-10-CM | POA: Insufficient documentation

## 2021-04-18 DIAGNOSIS — Z5111 Encounter for antineoplastic chemotherapy: Secondary | ICD-10-CM | POA: Diagnosis not present

## 2021-04-18 DIAGNOSIS — K440 Diaphragmatic hernia with obstruction, without gangrene: Secondary | ICD-10-CM | POA: Diagnosis not present

## 2021-04-18 DIAGNOSIS — C480 Malignant neoplasm of retroperitoneum: Secondary | ICD-10-CM | POA: Diagnosis not present

## 2021-04-18 DIAGNOSIS — I495 Sick sinus syndrome: Secondary | ICD-10-CM | POA: Insufficient documentation

## 2021-04-18 LAB — CBC WITH DIFFERENTIAL (CANCER CENTER ONLY)
Abs Immature Granulocytes: 0.03 10*3/uL (ref 0.00–0.07)
Basophils Absolute: 0 10*3/uL (ref 0.0–0.1)
Basophils Relative: 1 %
Eosinophils Absolute: 0.4 10*3/uL (ref 0.0–0.5)
Eosinophils Relative: 8 %
HCT: 35.4 % — ABNORMAL LOW (ref 39.0–52.0)
Hemoglobin: 12 g/dL — ABNORMAL LOW (ref 13.0–17.0)
Immature Granulocytes: 1 %
Lymphocytes Relative: 18 %
Lymphs Abs: 0.9 10*3/uL (ref 0.7–4.0)
MCH: 32.8 pg (ref 26.0–34.0)
MCHC: 33.9 g/dL (ref 30.0–36.0)
MCV: 96.7 fL (ref 80.0–100.0)
Monocytes Absolute: 0.6 10*3/uL (ref 0.1–1.0)
Monocytes Relative: 12 %
Neutro Abs: 3 10*3/uL (ref 1.7–7.7)
Neutrophils Relative %: 60 %
Platelet Count: 170 10*3/uL (ref 150–400)
RBC: 3.66 MIL/uL — ABNORMAL LOW (ref 4.22–5.81)
RDW: 13.3 % (ref 11.5–15.5)
WBC Count: 5 10*3/uL (ref 4.0–10.5)
nRBC: 0 % (ref 0.0–0.2)

## 2021-04-18 LAB — CMP (CANCER CENTER ONLY)
ALT: 25 U/L (ref 0–44)
AST: 32 U/L (ref 15–41)
Albumin: 3.6 g/dL (ref 3.5–5.0)
Alkaline Phosphatase: 244 U/L — ABNORMAL HIGH (ref 38–126)
Anion gap: 5 (ref 5–15)
BUN: 18 mg/dL (ref 8–23)
CO2: 29 mmol/L (ref 22–32)
Calcium: 9.1 mg/dL (ref 8.9–10.3)
Chloride: 105 mmol/L (ref 98–111)
Creatinine: 1.32 mg/dL — ABNORMAL HIGH (ref 0.61–1.24)
GFR, Estimated: 56 mL/min — ABNORMAL LOW (ref >60)
Glucose, Bld: 95 mg/dL (ref 70–99)
Potassium: 4.3 mmol/L (ref 3.5–5.1)
Sodium: 139 mmol/L (ref 135–145)
Total Bilirubin: 0.5 mg/dL (ref 0.3–1.2)
Total Protein: 6.2 g/dL — ABNORMAL LOW (ref 6.5–8.1)

## 2021-04-18 MED ORDER — SODIUM CHLORIDE 0.9% FLUSH
10.0000 mL | Freq: Once | INTRAVENOUS | Status: AC
  Administered 2021-04-18: 09:00:00 10 mL

## 2021-04-18 MED ORDER — DEXTROSE 5 % IV SOLN: Freq: Once | INTRAVENOUS | Status: AC

## 2021-04-18 MED ORDER — SODIUM CHLORIDE 0.9 % IV SOLN
10.0000 mg | Freq: Once | INTRAVENOUS | Status: AC
  Administered 2021-04-18: 11:00:00 10 mg via INTRAVENOUS
  Filled 2021-04-18: qty 10

## 2021-04-18 MED ORDER — SODIUM CHLORIDE 0.9% FLUSH
10.0000 mL | INTRAVENOUS | Status: DC | PRN
  Administered 2021-04-18: 13:00:00 10 mL

## 2021-04-18 MED ORDER — DOXORUBICIN HCL LIPOSOMAL CHEMO INJECTION 2 MG/ML
20.0000 mg/m2 | Freq: Once | INTRAVENOUS | Status: AC
  Administered 2021-04-18: 12:00:00 40 mg via INTRAVENOUS
  Filled 2021-04-18: qty 20

## 2021-04-18 MED ORDER — TRIAMCINOLONE ACETONIDE 0.1 % EX CREA: 1.0000 "application " | TOPICAL_CREAM | Freq: Every day | CUTANEOUS | 1 refills | Status: AC | PRN

## 2021-04-18 MED ORDER — HEPARIN SOD (PORK) LOCK FLUSH 100 UNIT/ML IV SOLN
500.0000 [IU] | Freq: Once | INTRAVENOUS | Status: AC | PRN
  Administered 2021-04-18: 13:00:00 500 [IU]

## 2021-04-18 NOTE — Assessment & Plan Note (Signed)
William Avila is a 75 year old man who is here today for evaluation prior to Doxil chemotherapy for his metastatic liposarcoma.  1.  Metastatic liposarcoma: He continues on treatment with Doxil.  Today he will receive cycle 3.  They had several questions about his cancer and pathology.  I reviewed with William Avila and his wife the images from his CT scan showing them where the different areas of concern were.  I also discussed the pathology results in the grade of the tumor.  He is due for restaging after 4 cycles.  I placed orders for this to occur after his next cycle.  2.  Skin rash: It is unclear what this is.  It does not appear to be related to the chemotherapy.  I suggested that he get Cetaphil cream to use instead of lotion, also I refilled his triamcinolone cream to apply.  Since William Avila and [place] I showed them how to send a picture through MyChart if the rash worsens and they need help or advice on its management.  William Avila will return in 3 weeks for labs, follow-up, and his next treatment.  At their request I went ahead and scheduled few more cycles so they could tentatively plan his treatment day since his wife is working.  They understand this relies on scan results.

## 2021-04-18 NOTE — Patient Instructions (Signed)
William Avila ONCOLOGY  Discharge Instructions: Thank you for choosing [facility] to provide your oncology and hematology care.   If you have a lab appointment with the [facility], please go directly to the [facility] and check in at the registration area.   Wear comfortable clothing and clothing appropriate for easy access to any Portacath or PICC line.   We strive to give you quality time with your provider. You may need to reschedule your appointment if you arrive late (15 or more minutes).  Arriving late affects you and other patients whose appointments are after yours.  Also, if you miss three or more appointments without notifying the office, you may be dismissed from the clinic at the providers discretion.      For prescription refill requests, have your pharmacy contact our office and allow 72 hours for refills to be completed.    Today you received the following chemotherapy and/or immunotherapy agents:  Liposomal Doxorubicin      To help prevent nausea and vomiting after your treatment, we encourage you to take your nausea medication as directed.  BELOW ARE SYMPTOMS THAT SHOULD BE REPORTED IMMEDIATELY: *FEVER GREATER THAN 100.4 F (38 C) OR HIGHER *CHILLS OR SWEATING *NAUSEA AND VOMITING THAT IS NOT CONTROLLED WITH YOUR NAUSEA MEDICATION *UNUSUAL SHORTNESS OF BREATH *UNUSUAL BRUISING OR BLEEDING *URINARY PROBLEMS (pain or burning when urinating, or frequent urination) *BOWEL PROBLEMS (unusual diarrhea, constipation, pain near the anus) TENDERNESS IN MOUTH AND THROAT WITH OR WITHOUT PRESENCE OF ULCERS (sore throat, sores in mouth, or a toothache) UNUSUAL RASH, SWELLING OR PAIN  UNUSUAL VAGINAL DISCHARGE OR ITCHING   Items with * indicate a potential emergency and should be followed up as soon as possible or go to the Emergency Department if any problems should occur.  Please show the CHEMOTHERAPY ALERT CARD or IMMUNOTHERAPY ALERT  CARD at check-in to the Emergency Department and triage nurse.  Should you have questions after your visit or need to cancel or reschedule your appointment, please contact [facility]  Dept: [phone]  and follow the prompts.  Office hours are 8:00 a.m. to 4:30 p.m. Monday - Friday. Please note that voicemails left after 4:00 p.m. may not be returned until the following business day.  We are closed weekends and major holidays. You have access to a nurse at all times for urgent questions. Please call the main number to the clinic Dept: [phone] and follow the prompts.   For any non-urgent questions, you may also contact your provider using MyChart. We now offer e-Visits for anyone 85 and older to request care online for non-urgent symptoms. For details visit [email or web address].   Also download the MyChart app! Go to the app store, search "MyChart", open the app, select [facility], and log in with your MyChart username and password.  Due to Covid, a mask is required upon entering the hospital/clinic. If you do not have a mask, one will be given to you upon arrival. For doctor visits, patients may have 1 support person aged 95 or older with them. For treatment visits, patients cannot have anyone with them due to current Covid guidelines and our immunocompromised population.

## 2021-04-19 ENCOUNTER — Telehealth: Payer: Self-pay | Admitting: Oncology

## 2021-04-19 NOTE — Telephone Encounter (Signed)
Scheduled appointment per 12/29 los. Patient is aware.

## 2021-04-22 ENCOUNTER — Other Ambulatory Visit: Payer: Self-pay

## 2021-04-22 ENCOUNTER — Emergency Department (HOSPITAL_COMMUNITY): Payer: Medicare HMO

## 2021-04-22 ENCOUNTER — Emergency Department (HOSPITAL_COMMUNITY)
Admission: EM | Admit: 2021-04-22 | Discharge: 2021-04-22 | Disposition: A | Discharge status: Home / Self Care | Payer: Medicare HMO | Attending: Emergency Medicine | Admitting: Emergency Medicine

## 2021-04-22 ENCOUNTER — Encounter (HOSPITAL_COMMUNITY): Payer: Self-pay | Admitting: *Deleted

## 2021-04-22 DIAGNOSIS — R109 Unspecified abdominal pain: Secondary | ICD-10-CM | POA: Diagnosis not present

## 2021-04-22 DIAGNOSIS — N261 Atrophy of kidney (terminal): Secondary | ICD-10-CM | POA: Diagnosis not present

## 2021-04-22 DIAGNOSIS — Z7901 Long term (current) use of anticoagulants: Secondary | ICD-10-CM | POA: Diagnosis not present

## 2021-04-22 DIAGNOSIS — C480 Malignant neoplasm of retroperitoneum: Secondary | ICD-10-CM | POA: Diagnosis not present

## 2021-04-22 DIAGNOSIS — R042 Hemoptysis: Secondary | ICD-10-CM | POA: Diagnosis not present

## 2021-04-22 DIAGNOSIS — Z79899 Other long term (current) drug therapy: Secondary | ICD-10-CM | POA: Insufficient documentation

## 2021-04-22 DIAGNOSIS — I700 Atherosclerosis of aorta: Secondary | ICD-10-CM | POA: Insufficient documentation

## 2021-04-22 DIAGNOSIS — Z20822 Contact with and (suspected) exposure to covid-19: Secondary | ICD-10-CM | POA: Diagnosis not present

## 2021-04-22 DIAGNOSIS — Z950 Presence of cardiac pacemaker: Secondary | ICD-10-CM | POA: Diagnosis not present

## 2021-04-22 DIAGNOSIS — N3289 Other specified disorders of bladder: Secondary | ICD-10-CM | POA: Diagnosis not present

## 2021-04-22 DIAGNOSIS — C7800 Secondary malignant neoplasm of unspecified lung: Secondary | ICD-10-CM | POA: Diagnosis not present

## 2021-04-22 LAB — COMPREHENSIVE METABOLIC PANEL
ALT: 28 U/L (ref 0–44)
AST: 30 U/L (ref 15–41)
Albumin: 3.4 g/dL — ABNORMAL LOW (ref 3.5–5.0)
Alkaline Phosphatase: 234 U/L — ABNORMAL HIGH (ref 38–126)
Anion gap: 7 (ref 5–15)
BUN: 21 mg/dL (ref 8–23)
CO2: 26 mmol/L (ref 22–32)
Calcium: 8.8 mg/dL — ABNORMAL LOW (ref 8.9–10.3)
Chloride: 104 mmol/L (ref 98–111)
Creatinine, Ser: 1.15 mg/dL (ref 0.61–1.24)
GFR, Estimated: >60 mL/min (ref >60)
Glucose, Bld: 100 mg/dL — ABNORMAL HIGH (ref 70–99)
Potassium: 4 mmol/L (ref 3.5–5.1)
Sodium: 137 mmol/L (ref 135–145)
Total Bilirubin: 0.5 mg/dL (ref 0.3–1.2)
Total Protein: 6.3 g/dL — ABNORMAL LOW (ref 6.5–8.1)

## 2021-04-22 LAB — RESP PANEL BY RT-PCR (FLU A&B, COVID) ARPGX2
Influenza A by PCR: NEGATIVE
Influenza B by PCR: NEGATIVE
SARS Coronavirus 2 by RT PCR: NEGATIVE

## 2021-04-22 LAB — CBC WITH DIFFERENTIAL/PLATELET
Abs Immature Granulocytes: 0.06 10*3/uL (ref 0.00–0.07)
Basophils Absolute: 0 10*3/uL (ref 0.0–0.1)
Basophils Relative: 1 %
Eosinophils Absolute: 0.5 10*3/uL (ref 0.0–0.5)
Eosinophils Relative: 10 %
HCT: 36.3 % — ABNORMAL LOW (ref 39.0–52.0)
Hemoglobin: 12.4 g/dL — ABNORMAL LOW (ref 13.0–17.0)
Immature Granulocytes: 1 %
Lymphocytes Relative: 21 %
Lymphs Abs: 1 10*3/uL (ref 0.7–4.0)
MCH: 33.9 pg (ref 26.0–34.0)
MCHC: 34.2 g/dL (ref 30.0–36.0)
MCV: 99.2 fL (ref 80.0–100.0)
Monocytes Absolute: 0.7 10*3/uL (ref 0.1–1.0)
Monocytes Relative: 14 %
Neutro Abs: 2.6 10*3/uL (ref 1.7–7.7)
Neutrophils Relative %: 53 %
Platelets: 165 10*3/uL (ref 150–400)
RBC: 3.66 MIL/uL — ABNORMAL LOW (ref 4.22–5.81)
RDW: 13.6 % (ref 11.5–15.5)
WBC: 4.9 10*3/uL (ref 4.0–10.5)
nRBC: 0 % (ref 0.0–0.2)

## 2021-04-22 LAB — PROTIME-INR
INR: 1.1 (ref 0.8–1.2)
Prothrombin Time: 13.8 seconds (ref 11.4–15.2)

## 2021-04-22 MED ORDER — DIPHENHYDRAMINE HCL 50 MG/ML IJ SOLN
50.0000 mg | Freq: Once | INTRAMUSCULAR | Status: AC
  Administered 2021-04-22: 50 mg via INTRAVENOUS
  Filled 2021-04-22: qty 1

## 2021-04-22 MED ORDER — HYDROCORTISONE SOD SUC (PF) 100 MG IJ SOLR
200.0000 mg | Freq: Once | INTRAMUSCULAR | Status: AC
  Administered 2021-04-22: 200 mg via INTRAVENOUS
  Filled 2021-04-22: qty 4

## 2021-04-22 MED ORDER — DIPHENHYDRAMINE HCL 25 MG PO CAPS: 50.0000 mg | ORAL_CAPSULE | Freq: Once | ORAL | Status: AC

## 2021-04-22 MED ORDER — IOHEXOL 350 MG/ML SOLN
80.0000 mL | Freq: Once | INTRAVENOUS | Status: AC | PRN
  Administered 2021-04-22: 80 mL via INTRAVENOUS

## 2021-04-22 NOTE — ED Provider Notes (Signed)
[facility] EMERGENCY DEPARTMENT Provider Note  CSN: [id number] Arrival date & time: 04/22/21 1628  History Chief Complaint  Patient presents with   Hemoptysis    William Avila is a 76 y.o. male with history of liposarcoma resected from abdomen and more recently discovered mets to lungs. He is getting chemo therapy at [facility], last was Dec 29. Today he had two episodes of low volume (quarter-sized) hemoptysis. He is not having any fever CP or SOB. He is on Eliquis for prior heart problems. He was advised by his Oncology team to come to the ED for evaluation.    Home Medications Prior to Admission medications   Medication Sig Start Date End Date Taking? Authorizing Provider  acetaminophen (TYLENOL) 500 MG tablet Take 500-1,000 mg by mouth every 6 (six) hours as needed for mild pain.    [provider]  apixaban (ELIQUIS) 5 MG TABS tablet TAKE 1 TABLET BY MOUTH TWICE A DAY 11/13/20   Evans Lance, MD  lidocaine-prilocaine (EMLA) cream Apply 1 application topically as needed. 01/24/21   [name], MD  lisinopril (ZESTRIL) 10 MG tablet Take 15 mg by mouth daily.    [provider]  metoprolol succinate (TOPROL XL) 25 MG 24 hr tablet Take 1 tablet (25 mg total) by mouth daily. 08/23/20   Donato Heinz, MD  Multiple Vitamins-Minerals (MULTIVITAMIN WITH MINERALS) tablet Take 1 tablet by mouth in the morning.    [provider]  ondansetron (ZOFRAN-ODT) 8 MG disintegrating tablet Take 8 mg by mouth every 8 (eight) hours as needed for nausea or vomiting. 01/17/20   [provider]  Polyethyl Glycol-Propyl Glycol (SYSTANE) 0.4-0.3 % SOLN Place 1-2 drops into both eyes 3 (three) times daily as needed (dry/irritated eyes.).    [provider]  predniSONE (DELTASONE) 50 MG tablet Pt to take 50 mg of prednisone on 01/17/21 at 8:40PM, 50 mg of prednisone on 01/18/21 at 02:40AM, and 50 mg of prednisone on 01/18/21 at 08:40AM. Pt is also to take 50 mg  of benadryl on 01/18/21 at 8:40. Please call [phone] with any questions. 01/01/21   Titus Dubin, MD  rosuvastatin (CRESTOR) 10 MG tablet Take 1 tablet (10 mg total) by mouth daily. 08/23/20 02/05/21  Donato Heinz, MD  triamcinolone cream (KENALOG) 0.1 % Apply 1 application topically daily as needed (itching/irritation/rash). 04/18/21   Gardenia Phlegm, NP     Allergies    Codeine, Ibuprofen, Robaxin [methocarbamol], Chlorhexidine, Contrast media [iodinated contrast media], Fentanyl and related, Other, and Tramadol hcl   Review of Systems   Review of Systems Please see HPI for pertinent positives and negatives  Physical Exam BP (!) 145/94    Pulse (!) 56    Resp 20    SpO2 100%   Physical Exam Vitals and nursing note reviewed.  Constitutional:      Appearance: Normal appearance.  HENT:     Head: Normocephalic and atraumatic.     Nose: Nose normal.     Mouth/Throat:     Mouth: Mucous membranes are moist.  Eyes:     Extraocular Movements: Extraocular movements intact.     Conjunctiva/sclera: Conjunctivae normal.  Cardiovascular:     Rate and Rhythm: Normal rate.  Pulmonary:     Effort: Pulmonary effort is normal.     Breath sounds: Normal breath sounds. No wheezing, rhonchi or rales.     Comments: Port in R upper chest, pacemaker in L upper chest Abdominal:  General: Abdomen is flat.     Palpations: Abdomen is soft.     Tenderness: There is no abdominal tenderness.  Musculoskeletal:        General: No swelling. Normal range of motion.     Cervical back: Neck supple.     Right lower leg: No edema.     Left lower leg: No edema.  Skin:    General: Skin is warm and dry.  Neurological:     General: No focal deficit present.     Mental Status: He is alert.  Psychiatric:        Mood and Affect: Mood normal.    ED Results / Procedures / Treatments   EKG None  Procedures Procedures  Medications Ordered in the ED Medications   hydrocortisone sodium succinate (SOLU-CORTEF) 100 MG injection 200 mg (has no administration in time range)  diphenhydrAMINE (BENADRYL) capsule 50 mg (has no administration in time range)    Or  diphenhydrAMINE (BENADRYL) injection 50 mg (has no administration in time range)    Initial Impression and Plan  Patient with known cancer with mets to lungs on Eliquis is here for hemoptysis. Will need CTA to rule out PE or other cause of bleeding. He is due for a repeat staging scan soon, so will add CT Abd/Pel as well. He will need pretreatment due to contrast allergy in addition our CT scanner is down today so he will need to be transported to and from [facility] for [facility]. Labs ordered as well. Patient and wife at bedside aware of the plan.   ED Course   Clinical Course as of 04/22/21 2235  Mon Apr 22, 2021  1747 CBC is unremarkable.  [CS]  6803 INR is normal.  [CS]  2122 CXR is unchanged from prior.  [CS]  4825 CMP is unremarkable.  [CS]  0037 Covid/Flu are neg.  [CS]  2234 CT is neg for PE, no signs of large vessel source of bleeding and he has not had any further hemoptysis. He is feeling well and ready to go home. Advised outpatient Onc follow up as scheduled.  [CS]    Clinical Course User Index [CS] Truddie Hidden, MD     MDM Rules/Calculators/A&P Medical Decision Making Problems Addressed: Hemoptysis: complicated acute illness or injury  Amount and/or Complexity of Data Reviewed Labs: ordered. Decision-making details documented in ED Course. Radiology: ordered. Decision-making details documented in ED Course.  Risk OTC drugs. Decision regarding hospitalization.    Final Clinical Impression(s) / ED Diagnoses Final diagnoses:  Hemoptysis    Rx / DC Orders ED Discharge Orders     None        Truddie Hidden, MD 04/22/21 2236

## 2021-04-22 NOTE — ED Triage Notes (Signed)
Patient on chemo, last dost 12/29. States he coughed up blood X 2

## 2021-04-23 ENCOUNTER — Telehealth: Payer: Self-pay | Admitting: *Deleted

## 2021-04-23 NOTE — Telephone Encounter (Signed)
Received faxed message from [employer] after hours service on 04/23/2021:  Message received by Access Nurse 04/22/2021 @ 3:28 PM: Patient began coughing up blood in afternoon 04/22/2021 and was advised to go to nearest ED and went to [facility] ED.  He received scans and was d/c'd without further treatment.   Dr. Alen Blew informed of information contained in fax

## 2021-04-26 ENCOUNTER — Encounter: Payer: Self-pay | Admitting: Oncology

## 2021-05-03 ENCOUNTER — Other Ambulatory Visit: Payer: Self-pay

## 2021-05-03 MED ORDER — PREDNISONE 50 MG PO TABS: ORAL_TABLET | ORAL | 0 refills | Status: AC

## 2021-05-07 NOTE — Progress Notes (Signed)
Cancelled pt CT scan for 1/18, per [facility] as pt had recent scans during hospitalization.  Pt notified.

## 2021-05-08 ENCOUNTER — Ambulatory Visit (HOSPITAL_COMMUNITY): Payer: Medicare HMO

## 2021-05-08 ENCOUNTER — Encounter (HOSPITAL_COMMUNITY): Payer: Self-pay

## 2021-05-09 MED FILL — Dexamethasone Sodium Phosphate Inj 100 MG/10ML: INTRAMUSCULAR | Qty: 1 | Status: AC

## 2021-05-10 ENCOUNTER — Inpatient Hospital Stay: Payer: Medicare HMO

## 2021-05-10 ENCOUNTER — Inpatient Hospital Stay: Payer: Medicare HMO | Admitting: Oncology

## 2021-05-10 ENCOUNTER — Inpatient Hospital Stay: Payer: Medicare HMO | Attending: Oncology

## 2021-05-10 ENCOUNTER — Other Ambulatory Visit: Payer: Self-pay

## 2021-05-10 VITALS — BP 138/90 | HR 58 | Temp 97.7°F | Resp 17 | Ht 71.0 in | Wt 163.8 lb

## 2021-05-10 DIAGNOSIS — Z885 Allergy status to narcotic agent status: Secondary | ICD-10-CM | POA: Diagnosis not present

## 2021-05-10 DIAGNOSIS — Z95828 Presence of other vascular implants and grafts: Secondary | ICD-10-CM

## 2021-05-10 DIAGNOSIS — Z5111 Encounter for antineoplastic chemotherapy: Secondary | ICD-10-CM | POA: Diagnosis present

## 2021-05-10 DIAGNOSIS — Z7901 Long term (current) use of anticoagulants: Secondary | ICD-10-CM | POA: Diagnosis not present

## 2021-05-10 DIAGNOSIS — Z79899 Other long term (current) drug therapy: Secondary | ICD-10-CM | POA: Insufficient documentation

## 2021-05-10 DIAGNOSIS — C480 Malignant neoplasm of retroperitoneum: Secondary | ICD-10-CM | POA: Diagnosis not present

## 2021-05-10 DIAGNOSIS — R059 Cough, unspecified: Secondary | ICD-10-CM | POA: Insufficient documentation

## 2021-05-10 DIAGNOSIS — Z923 Personal history of irradiation: Secondary | ICD-10-CM | POA: Insufficient documentation

## 2021-05-10 DIAGNOSIS — C7800 Secondary malignant neoplasm of unspecified lung: Secondary | ICD-10-CM | POA: Insufficient documentation

## 2021-05-10 DIAGNOSIS — Z886 Allergy status to analgesic agent status: Secondary | ICD-10-CM | POA: Insufficient documentation

## 2021-05-10 LAB — CBC WITH DIFFERENTIAL (CANCER CENTER ONLY)
Abs Immature Granulocytes: 0.02 10*3/uL (ref 0.00–0.07)
Basophils Absolute: 0 10*3/uL (ref 0.0–0.1)
Basophils Relative: 1 %
Eosinophils Absolute: 0.3 10*3/uL (ref 0.0–0.5)
Eosinophils Relative: 6 %
HCT: 36.5 % — ABNORMAL LOW (ref 39.0–52.0)
Hemoglobin: 12.3 g/dL — ABNORMAL LOW (ref 13.0–17.0)
Immature Granulocytes: 1 %
Lymphocytes Relative: 20 %
Lymphs Abs: 0.8 10*3/uL (ref 0.7–4.0)
MCH: 32.8 pg (ref 26.0–34.0)
MCHC: 33.7 g/dL (ref 30.0–36.0)
MCV: 97.3 fL (ref 80.0–100.0)
Monocytes Absolute: 0.6 10*3/uL (ref 0.1–1.0)
Monocytes Relative: 14 %
Neutro Abs: 2.5 10*3/uL (ref 1.7–7.7)
Neutrophils Relative %: 58 %
Platelet Count: 171 10*3/uL (ref 150–400)
RBC: 3.75 MIL/uL — ABNORMAL LOW (ref 4.22–5.81)
RDW: 14.1 % (ref 11.5–15.5)
WBC Count: 4.3 10*3/uL (ref 4.0–10.5)
nRBC: 0 % (ref 0.0–0.2)

## 2021-05-10 LAB — CMP (CANCER CENTER ONLY)
ALT: 28 U/L (ref 0–44)
AST: 39 U/L (ref 15–41)
Albumin: 3.9 g/dL (ref 3.5–5.0)
Alkaline Phosphatase: 242 U/L — ABNORMAL HIGH (ref 38–126)
Anion gap: 6 (ref 5–15)
BUN: 21 mg/dL (ref 8–23)
CO2: 27 mmol/L (ref 22–32)
Calcium: 9.5 mg/dL (ref 8.9–10.3)
Chloride: 103 mmol/L (ref 98–111)
Creatinine: 1.34 mg/dL — ABNORMAL HIGH (ref 0.61–1.24)
GFR, Estimated: 55 mL/min — ABNORMAL LOW (ref >60)
Glucose, Bld: 94 mg/dL (ref 70–99)
Potassium: 4.4 mmol/L (ref 3.5–5.1)
Sodium: 136 mmol/L (ref 135–145)
Total Bilirubin: 0.5 mg/dL (ref 0.3–1.2)
Total Protein: 6.5 g/dL (ref 6.5–8.1)

## 2021-05-10 MED ORDER — SODIUM CHLORIDE 0.9 % IV SOLN
10.0000 mg | Freq: Once | INTRAVENOUS | Status: AC
  Administered 2021-05-10: 10 mg via INTRAVENOUS
  Filled 2021-05-10: qty 10

## 2021-05-10 MED ORDER — DEXTROSE 5 % IV SOLN: Freq: Once | INTRAVENOUS | Status: AC

## 2021-05-10 MED ORDER — HEPARIN SOD (PORK) LOCK FLUSH 100 UNIT/ML IV SOLN
500.0000 [IU] | Freq: Once | INTRAVENOUS | Status: AC | PRN
  Administered 2021-05-10: 500 [IU]

## 2021-05-10 MED ORDER — SODIUM CHLORIDE 0.9% FLUSH
10.0000 mL | INTRAVENOUS | Status: DC | PRN
  Administered 2021-05-10: 10 mL

## 2021-05-10 MED ORDER — DOXORUBICIN HCL LIPOSOMAL CHEMO INJECTION 2 MG/ML
20.0000 mg/m2 | Freq: Once | INTRAVENOUS | Status: AC
  Administered 2021-05-10: 40 mg via INTRAVENOUS
  Filled 2021-05-10: qty 20

## 2021-05-10 MED ORDER — SODIUM CHLORIDE 0.9% FLUSH
10.0000 mL | Freq: Once | INTRAVENOUS | Status: AC
  Administered 2021-05-10: 10 mL

## 2021-05-10 NOTE — Progress Notes (Signed)
Hematology and Oncology Follow Up Visit  William Avila [id number] March 14, 1946 76 y.o. 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    Prior Therapy: He underwent a radical resection by Dr. Barry Dienes on January 19, 2020.  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    He subsequently received adjuvant radiation therapy under the care of Dr. Lisbeth Renshaw completed on April 06, 2020.  He received a total dose of 48.6 Gray to the right abdomen.    Current therapy: Doxil 20 mg per metered square every 3 weeks started on February 07, 2021.  He is here for cycle 4 of therapy.  Interim History: William Avila presents today for a return evaluation.  Since the last visit, he was seen in the emergency department on April 22, 2021 for cough and hemoptysis.  He underwent imaging studies at that time and did not have any pulmonary embolism.  He reports his symptoms has resolved currently.  He denies any shortness of breath, difficulty breathing or hemoptysis.  He has very little cough.  He continues to tolerate Doxil reasonably well currently.  His performance status and quality of life remains unchanged.     Medications: Updated on review. Current Outpatient Medications  Medication Sig Dispense Refill   acetaminophen (TYLENOL) 500 MG tablet Take 500-1,000 mg by mouth every 6 (six) hours as needed for mild pain.     apixaban (ELIQUIS) 5 MG TABS tablet TAKE 1 TABLET BY MOUTH TWICE A DAY 60 tablet 6   lidocaine-prilocaine (EMLA) cream Apply 1 application topically as needed. 30 g 0   lisinopril (ZESTRIL) 10 MG tablet Take 15 mg by mouth daily.     metoprolol succinate (TOPROL XL) 25 MG 24 hr tablet Take 1 tablet (25 mg total) by mouth daily. 90 tablet 3   Multiple Vitamins-Minerals  (MULTIVITAMIN WITH MINERALS) tablet Take 1 tablet by mouth in the morning.     ondansetron (ZOFRAN-ODT) 8 MG disintegrating tablet Take 8 mg by mouth every 8 (eight) hours as needed for nausea or vomiting.     Polyethyl Glycol-Propyl Glycol (SYSTANE) 0.4-0.3 % SOLN Place 1-2 drops into both eyes 3 (three) times daily as needed (dry/irritated eyes.).     predniSONE (DELTASONE) 50 MG tablet Pt to take 50 mg of prednisone on 01/17/21 at 8:40PM, 50 mg of prednisone on 01/18/21 at 02:40AM, and 50 mg of prednisone on 01/18/21 at 08:40AM. Pt is also to take 50 mg of benadryl on 01/18/21 at 8:40. Please call [phone] with any questions. 3 tablet 0   predniSONE (DELTASONE) 50 MG tablet Take 1 tablet by mouth at 4am on 05/08/21, take 1 tablet by mouth at 10am on 05/08/21, and take 1 tablet by mouth at 4pm on 05/08/21 3 tablet 0   rosuvastatin (CRESTOR) 10 MG tablet Take 1 tablet (10 mg total) by mouth daily. 90 tablet 3   triamcinolone cream (KENALOG) 0.1 % Apply 1 application topically daily as needed (itching/irritation/rash). 30 g 1   No current facility-administered medications for this visit.     Allergies:  Allergies  Allergen Reactions   Codeine Anaphylaxis   Ibuprofen Anaphylaxis   Robaxin [Methocarbamol] Anaphylaxis, Hives, Swelling and Rash   Chlorhexidine Other (See Comments)    Unknown reaction   Contrast Media [Iodinated Contrast Media] Itching and Other (See Comments)    Itching and bumps on skin.  Fentanyl And Related Other (See Comments)    Dry heaves (lasted for 14 hours)   Other Other (See Comments)    Nerve Block Tray: causes nausea/dry heaves   Tramadol Hcl Other (See Comments)    Changes personality, makes him angry     Physical Exam: Blood pressure 138/90, pulse (!) 58, temperature 97.7 F (36.5 C), temperature source Axillary, resp. rate 17, height 5\' 11"  (1.803 m), weight 163 lb 12.8 oz (74.3 kg), SpO2 98 %. ECOG: 0   General appearance: Comfortable appearing without  any discomfort Head: Normocephalic without any trauma Oropharynx: Mucous membranes are moist and pink without any thrush or ulcers. Eyes: Pupils are equal and round reactive to light. Lymph nodes: No cervical, supraclavicular, inguinal or axillary lymphadenopathy.   Heart:regular rate and rhythm.  S1 and S2 without leg edema. Lung: Clear without any rhonchi or wheezes.  No dullness to percussion. Abdomin: Soft, nontender, nondistended with good bowel sounds.  No hepatosplenomegaly. Musculoskeletal: No joint deformity or effusion.  Full range of motion noted. Neurological: No deficits noted on motor, sensory and deep tendon reflex exam. Skin: No petechial rash or dryness.  Appeared moist.      Lab Results: Lab Results  Component Value Date   WBC 4.3 05/10/2021   HGB 12.3 (L) 05/10/2021   HCT 36.5 (L) 05/10/2021   MCV 97.3 05/10/2021   PLT 171 05/10/2021     Chemistry      Component Value Date/Time   NA 137 04/22/2021 1736   NA 138 08/23/2020 1042   K 4.0 04/22/2021 1736   CL 104 04/22/2021 1736   CO2 26 04/22/2021 1736   BUN 21 04/22/2021 1736   BUN 16 08/23/2020 1042   CREATININE 1.15 04/22/2021 1736   CREATININE 1.32 (H) 04/18/2021 0915      Component Value Date/Time   CALCIUM 8.8 (L) 04/22/2021 1736   ALKPHOS 234 (H) 04/22/2021 1736   AST 30 04/22/2021 1736   AST 32 04/18/2021 0915   ALT 28 04/22/2021 1736   ALT 25 04/18/2021 0915   BILITOT 0.5 04/22/2021 1736   BILITOT 0.5 04/18/2021 0915          Impression and Plan:    76 year old with:  1.  Advanced liposarcoma of the retroperitoneum with pulmonary involvement diagnosed in September 2022.     Imaging studies obtained on April 22, 2021 were personally reviewed and discussed with the patient.  Imaging studies did not show clear-cut evidence of progression of disease with a central necrosis noted in some of these pulmonary nodules.  I recommended continuing Doxil for the time being.  Risks and benefits  of continuing this approach were reviewed and I recommended repeating imaging studies after 3 more cycles of therapy.       2.  IV access: Port-A-Cath remains in use without any issues.   3.  Antiemetics: He is currently on Zofran without any worsening nausea or vomiting.   4.  Baseline cardiac evaluation: Echocardiogram was within normal range.   5.  Goals of care: Therapy remains palliative although aggressive measures are warranted at this time.   6.  Follow-up: In 3 weeks for the next cycle of therapy.   30  minutes were dedicated on this visit.  The time was spent on reviewing laboratory data, disease status update and outlining future plan of care discussion.   [name], MD 1/20/202311:40 AM

## 2021-05-17 DIAGNOSIS — I10 Essential (primary) hypertension: Secondary | ICD-10-CM | POA: Diagnosis not present

## 2021-05-17 DIAGNOSIS — E7800 Pure hypercholesterolemia, unspecified: Secondary | ICD-10-CM | POA: Diagnosis not present

## 2021-05-17 DIAGNOSIS — M19032 Primary osteoarthritis, left wrist: Secondary | ICD-10-CM | POA: Diagnosis not present

## 2021-05-30 ENCOUNTER — Inpatient Hospital Stay: Payer: Medicare HMO | Attending: Oncology

## 2021-05-30 ENCOUNTER — Other Ambulatory Visit: Payer: Self-pay

## 2021-05-30 ENCOUNTER — Inpatient Hospital Stay: Payer: Medicare HMO

## 2021-05-30 ENCOUNTER — Inpatient Hospital Stay: Payer: Medicare HMO | Admitting: Oncology

## 2021-05-30 VITALS — BP 130/87 | HR 51 | Temp 97.9°F | Resp 18 | Ht 71.0 in | Wt 165.1 lb

## 2021-05-30 DIAGNOSIS — C480 Malignant neoplasm of retroperitoneum: Secondary | ICD-10-CM | POA: Diagnosis not present

## 2021-05-30 DIAGNOSIS — Z7901 Long term (current) use of anticoagulants: Secondary | ICD-10-CM | POA: Insufficient documentation

## 2021-05-30 DIAGNOSIS — Z885 Allergy status to narcotic agent status: Secondary | ICD-10-CM | POA: Diagnosis not present

## 2021-05-30 DIAGNOSIS — Z923 Personal history of irradiation: Secondary | ICD-10-CM | POA: Insufficient documentation

## 2021-05-30 DIAGNOSIS — Z79899 Other long term (current) drug therapy: Secondary | ICD-10-CM | POA: Diagnosis not present

## 2021-05-30 DIAGNOSIS — Z5111 Encounter for antineoplastic chemotherapy: Secondary | ICD-10-CM | POA: Diagnosis not present

## 2021-05-30 DIAGNOSIS — Z886 Allergy status to analgesic agent status: Secondary | ICD-10-CM | POA: Diagnosis not present

## 2021-05-30 DIAGNOSIS — Z7952 Long term (current) use of systemic steroids: Secondary | ICD-10-CM | POA: Diagnosis not present

## 2021-05-30 LAB — CBC WITH DIFFERENTIAL (CANCER CENTER ONLY)
Abs Immature Granulocytes: 0.02 10*3/uL (ref 0.00–0.07)
Basophils Absolute: 0 10*3/uL (ref 0.0–0.1)
Basophils Relative: 1 %
Eosinophils Absolute: 0.2 10*3/uL (ref 0.0–0.5)
Eosinophils Relative: 6 %
HCT: 34.1 % — ABNORMAL LOW (ref 39.0–52.0)
Hemoglobin: 11.6 g/dL — ABNORMAL LOW (ref 13.0–17.0)
Immature Granulocytes: 1 %
Lymphocytes Relative: 16 %
Lymphs Abs: 0.5 10*3/uL — ABNORMAL LOW (ref 0.7–4.0)
MCH: 33.4 pg (ref 26.0–34.0)
MCHC: 34 g/dL (ref 30.0–36.0)
MCV: 98.3 fL (ref 80.0–100.0)
Monocytes Absolute: 0.5 10*3/uL (ref 0.1–1.0)
Monocytes Relative: 15 %
Neutro Abs: 2.1 10*3/uL (ref 1.7–7.7)
Neutrophils Relative %: 61 %
Platelet Count: 152 10*3/uL (ref 150–400)
RBC: 3.47 MIL/uL — ABNORMAL LOW (ref 4.22–5.81)
RDW: 14.1 % (ref 11.5–15.5)
WBC Count: 3.4 10*3/uL — ABNORMAL LOW (ref 4.0–10.5)
nRBC: 0 % (ref 0.0–0.2)

## 2021-05-30 LAB — CMP (CANCER CENTER ONLY)
ALT: 22 U/L (ref 0–44)
AST: 28 U/L (ref 15–41)
Albumin: 3.6 g/dL (ref 3.5–5.0)
Alkaline Phosphatase: 229 U/L — ABNORMAL HIGH (ref 38–126)
Anion gap: 4 — ABNORMAL LOW (ref 5–15)
BUN: 22 mg/dL (ref 8–23)
CO2: 29 mmol/L (ref 22–32)
Calcium: 8.8 mg/dL — ABNORMAL LOW (ref 8.9–10.3)
Chloride: 105 mmol/L (ref 98–111)
Creatinine: 1.4 mg/dL — ABNORMAL HIGH (ref 0.61–1.24)
GFR, Estimated: 52 mL/min — ABNORMAL LOW (ref >60)
Glucose, Bld: 110 mg/dL — ABNORMAL HIGH (ref 70–99)
Potassium: 4.6 mmol/L (ref 3.5–5.1)
Sodium: 138 mmol/L (ref 135–145)
Total Bilirubin: 0.4 mg/dL (ref 0.3–1.2)
Total Protein: 6 g/dL — ABNORMAL LOW (ref 6.5–8.1)

## 2021-05-30 MED ORDER — DEXTROSE 5 % IV SOLN: Freq: Once | INTRAVENOUS | Status: AC

## 2021-05-30 MED ORDER — SODIUM CHLORIDE 0.9 % IV SOLN
10.0000 mg | Freq: Once | INTRAVENOUS | Status: AC
  Administered 2021-05-30: 10 mg via INTRAVENOUS
  Filled 2021-05-30: qty 10

## 2021-05-30 MED ORDER — SODIUM CHLORIDE 0.9% FLUSH
10.0000 mL | INTRAVENOUS | Status: DC | PRN
  Administered 2021-05-30: 10 mL

## 2021-05-30 MED ORDER — DOXORUBICIN HCL LIPOSOMAL CHEMO INJECTION 2 MG/ML
20.0000 mg/m2 | Freq: Once | INTRAVENOUS | Status: AC
  Administered 2021-05-30: 40 mg via INTRAVENOUS
  Filled 2021-05-30: qty 20

## 2021-05-30 MED ORDER — HEPARIN SOD (PORK) LOCK FLUSH 100 UNIT/ML IV SOLN
500.0000 [IU] | Freq: Once | INTRAVENOUS | Status: AC | PRN
  Administered 2021-05-30: 500 [IU]

## 2021-05-30 NOTE — Patient Instructions (Signed)
[employer], Adult A central line is a long, thin tube (catheter) that is put into a vein so that it goes to a large vein above your heart. It can be used to: Give you medicine or fluids. Give you food and nutrients. Take blood or give you blood for testing or treatments. Types of central lines There are four main types of central lines: Peripherally inserted central catheter (PICC) line. This type is usually put in the upper arm and goes up the arm to the heart. Tunneled central line. This type is placed in a large vein in the neck, chest, or groin. It is tunneled under the skin and brought out through a second incision. Non-tunneled central line. This type is used for a shorter time than other types, usually for 7 days at the most. It is inserted in the neck, chest, or groin. Implanted port. This type can stay in place longer than other types of central lines. It is normally put in the upper chest but can also be placed in the upper arm or the belly. Surgery is needed to put it in and take it out. The type of central line you get will depend on how long you need it and your medical condition. Tell a doctor about: Any allergies you have. All medicines you are taking. These include vitamins, herbs, eye drops, creams, and over-the-counter medicines. Any problems you or family members have had with anesthetic medicines. Any blood disorders you have. Any surgeries you have had. Any medical conditions you have. Whether you are pregnant or may be pregnant. What are the risks? Generally, central lines are safe. However, problems may occur, including: Infection. A blood clot. Bleeding from the place where the central line was inserted. Getting a hole or crack in the central line. If this happens, the central line will need to be replaced. Central line failure. The catheter moving or coming out of place. What happens before the procedure? Medicines Ask your doctor about changing or  stopping: Your normal medicines. Vitamins, herbs, and supplements. Over-the-counter medicines. Do not take aspirin or ibuprofen unless you are told to. General instructions Follow instructions from your doctor about eating or drinking. For your safety, your doctor may: [place] the area of the procedure. Remove hair at the procedure site. Ask you to wash with a soap that kills germs. Plan to have a responsible adult take you home from the hospital or clinic. If you will be going home right after the procedure, plan to have a responsible adult care for you for the time you are told. This is important. What happens during the procedure? An IV tube will be put into one of your veins. You may be given: A sedative. This medicine helps you relax. Anesthetics. These medicines numb certain areas of your body. Your skin will be cleaned with a germ-killing (antiseptic) solution. You may be covered with clean drapes. Your blood pressure, heart rate, breathing rate, and blood oxygen level will be monitored during the procedure. The central line will be put into the vein and moved through it to the correct spot. The doctor may use X-ray equipment to help guide the central line to the right place. A bandage (dressing) will be placed over the insertion area. The procedure may vary among doctors and hospitals. What can I expect after the procedure? You will be monitored until you leave the hospital or clinic. This includes checking your blood pressure, heart rate, breathing rate, and blood oxygen level. Caps may  be placed on the ends of the central line tubing. If you were given a sedative during your procedure, do not drive or use machines until your doctor says that it is safe. Follow these instructions at home: Caring for the tube  Follow instructions from your doctor about: Flushing the tube. Cleaning the tube and the area around it. Only use germ-free (sterile) supplies to flush. The supplies  should be from your doctor, a pharmacy, or another place that your doctor recommends. Before you flush the tube or clean the area around the tube: Wash your hands with soap and water for at least 20 seconds. If you cannot use soap and water, use hand sanitizer. Clean the central line hub with rubbing alcohol. To do this: Scrub it using a twisting motion and rub for 10 to 15 seconds or for 30 twists. Follow the manufacturer's instructions. Be sure you scrub the top of the hub, not just the sides. Never reuse alcohol pads. Let the hub dry before use. Keep it from touching anything while drying. Caring for your skin Check the skin around the central line every day for signs of infection. Check for: Redness, swelling, or pain. Fluid or blood. Warmth. Pus or a bad smell. Keep the area where the tube was put in clean and dry. Change bandages only as told by your doctor. Keep your bandage dry. If a bandage gets wet, have it changed right away. General instructions Keep the tube clamped, unless it is being used. If you or someone else accidentally pulls on the tube, make sure: The bandage is okay. There is no bleeding. The tube has not been pulled out. Do not use scissors or sharp objects near the tube. Do not take baths, swim, or use a hot tub until your doctor says it is okay. Ask your doctor if you may take showers. You may only be allowed to take sponge baths. Ask your doctor what activities are safe for you. Your doctor may tell you not to lift anything or move your arm too much. Take over-the-counter and prescription medicines only as told by your doctor. Keep all follow-up visits. Storing and throwing away supplies Keep your supplies in a clean, dry location. Throw away any used syringes in a container that is only for sharp items (sharps container). You can buy a sharps container from a pharmacy, or you can make one by using an empty hard plastic bottle with a cover. Place any used  bandages or infusion bags into a plastic bag. Throw that bag in the trash. Contact a doctor if: You have any of these signs of infection where the tube was put in: Redness, swelling, or pain. Fluid or blood. Warmth. Pus or a bad smell. Get help right away if: You have: A fever or chills. Shortness of breath. Pain in your chest. A fast heartbeat. Swelling in your neck, face, chest, or arm. You feel dizzy or you faint. There are red lines coming from where the tube was put in. The area where the tube was put in is bleeding and the bleeding will not stop. Your tube is hard to flush. You do not get a blood return from the tube. The tube gets loose or comes out. The tube has a hole or a tear. The tube leaks. Summary A central line is a long, thin tube (catheter) that is put in your vein. It can be used to give you medicine, food, or fluids. Follow instructions from your doctor about flushing  and cleaning the tube. Keep the area where the tube was put in clean and dry. Ask your doctor what activities are safe for you. This information is not intended to replace advice given to you by your health care provider. Make sure you discuss any questions you have with your health care provider. Document Revised: 12/08/2019 Document Reviewed: 12/08/2019 Elsevier Patient Education  2022 [employer].

## 2021-05-30 NOTE — Patient Instructions (Signed)
[facility]  Discharge Instructions: Thank you for choosing [facility] to provide your oncology and hematology care.   If you have a lab appointment with the [facility], please go directly to the [facility] and check in at the registration area.   Wear comfortable clothing and clothing appropriate for easy access to any Portacath or PICC line.   We strive to give you quality time with your provider. You may need to reschedule your appointment if you arrive late (15 or more minutes).  Arriving late affects you and other patients whose appointments are after yours.  Also, if you miss three or more appointments without notifying the office, you may be dismissed from the clinic at the providers discretion.      For prescription refill requests, have your pharmacy contact our office and allow 72 hours for refills to be completed.    Today you received the following chemotherapy and/or immunotherapy agents: Doxil    To help prevent nausea and vomiting after your treatment, we encourage you to take your nausea medication as directed.  BELOW ARE SYMPTOMS THAT SHOULD BE REPORTED IMMEDIATELY: *FEVER GREATER THAN 100.4 F (38 C) OR HIGHER *CHILLS OR SWEATING *NAUSEA AND VOMITING THAT IS NOT CONTROLLED WITH YOUR NAUSEA MEDICATION *UNUSUAL SHORTNESS OF BREATH *UNUSUAL BRUISING OR BLEEDING *URINARY PROBLEMS (pain or burning when urinating, or frequent urination) *BOWEL PROBLEMS (unusual diarrhea, constipation, pain near the anus) TENDERNESS IN MOUTH AND THROAT WITH OR WITHOUT PRESENCE OF ULCERS (sore throat, sores in mouth, or a toothache) UNUSUAL RASH, SWELLING OR PAIN  UNUSUAL VAGINAL DISCHARGE OR ITCHING   Items with * indicate a potential emergency and should be followed up as soon as possible or go to the Emergency Department if any problems should occur.  Please show the CHEMOTHERAPY ALERT CARD or IMMUNOTHERAPY ALERT CARD at check-in to the  Emergency Department and triage nurse.  Should you have questions after your visit or need to cancel or reschedule your appointment, please contact [facility]  Dept: [phone]  and follow the prompts.  Office hours are 8:00 a.m. to 4:30 p.m. Monday - Friday. Please note that voicemails left after 4:00 p.m. may not be returned until the following business day.  We are closed weekends and major holidays. You have access to a nurse at all times for urgent questions. Please call the main number to the clinic Dept: [phone] and follow the prompts.   For any non-urgent questions, you may also contact your provider using MyChart. We now offer e-Visits for anyone 53 and older to request care online for non-urgent symptoms. For details visit [email or web address].   Also download the MyChart app! Go to the app store, search "MyChart", open the app, select [facility], and log in with your MyChart username and password.  Due to Covid, a mask is required upon entering the hospital/clinic. If you do not have a mask, one will be given to you upon arrival. For doctor visits, patients may have 1 support person aged 21 or older with them. For treatment visits, patients cannot have anyone with them due to current Covid guidelines and our immunocompromised population.

## 2021-05-30 NOTE — Progress Notes (Signed)
Hematology and Oncology Follow Up Visit  William Avila [id number] 10-Nov-1945 76 y.o. 05/30/2021 9:00 AM William Avila, MDGriffin, William Reichmann, MD   Principle Diagnosis: 76 year old with liposarcoma of the retroperitoneal space diagnosed in September 2021.  He developed advanced disease with a pulmonary involvement in September 2022.    Prior Therapy: He underwent a radical resection by Dr. Barry Avila on January 19, 2020.  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    He subsequently received adjuvant radiation therapy under the care of Dr. Lisbeth Avila completed on April 06, 2020.  He received a total dose of 48.6 Gray to the right abdomen.    Current therapy: Doxil 20 mg per metered square every 3 weeks started on February 07, 2021.  He is here for cycle 5 of therapy.  Interim History: William Avila returns today for a follow-up visit.  Since the last visit, he reports no major changes in his health.  He denies any complications related to Doxil.  He denies any nausea, vomiting or abdominal pain.  He denies any hospitalizations or illnesses.  He denies skin rashes or lesions.  His performance status and quality of life remained reasonable.  He denied any respiratory complaints.     Medications: Reviewed without changes. Current Outpatient Medications  Medication Sig Dispense Refill   acetaminophen (TYLENOL) 500 MG tablet Take 500-1,000 mg by mouth every 6 (six) hours as needed for mild pain.     apixaban (ELIQUIS) 5 MG TABS tablet TAKE 1 TABLET BY MOUTH TWICE A DAY 60 tablet 6   lidocaine-prilocaine (EMLA) cream Apply 1 application topically as needed. 30 g 0   lisinopril (ZESTRIL) 10 MG tablet Take 15 mg by mouth daily.     metoprolol succinate (TOPROL XL) 25 MG 24 hr tablet Take 1 tablet (25 mg total) by mouth daily. 90 tablet 3   Multiple Vitamins-Minerals (MULTIVITAMIN WITH MINERALS) tablet Take 1 tablet by mouth in the  morning.     ondansetron (ZOFRAN-ODT) 8 MG disintegrating tablet Take 8 mg by mouth every 8 (eight) hours as needed for nausea or vomiting.     Polyethyl Glycol-Propyl Glycol (SYSTANE) 0.4-0.3 % SOLN Place 1-2 drops into both eyes 3 (three) times daily as needed (dry/irritated eyes.).     predniSONE (DELTASONE) 50 MG tablet Pt to take 50 mg of prednisone on 01/17/21 at 8:40PM, 50 mg of prednisone on 01/18/21 at 02:40AM, and 50 mg of prednisone on 01/18/21 at 08:40AM. Pt is also to take 50 mg of benadryl on 01/18/21 at 8:40. Please call [phone] with any questions. 3 tablet 0   predniSONE (DELTASONE) 50 MG tablet Take 1 tablet by mouth at 4am on 05/08/21, take 1 tablet by mouth at 10am on 05/08/21, and take 1 tablet by mouth at 4pm on 05/08/21 3 tablet 0   rosuvastatin (CRESTOR) 10 MG tablet Take 1 tablet (10 mg total) by mouth daily. 90 tablet 3   triamcinolone cream (KENALOG) 0.1 % Apply 1 application topically daily as needed (itching/irritation/rash). 30 g 1   No current facility-administered medications for this visit.     Allergies:  Allergies  Allergen Reactions   Codeine Anaphylaxis   Ibuprofen Anaphylaxis   Robaxin [Methocarbamol] Anaphylaxis, Hives, Swelling and Rash   Chlorhexidine Other (See Comments)    Unknown reaction   Contrast Media [Iodinated Contrast Media] Itching and Other (See Comments)    Itching and bumps on skin.   Fentanyl And Related Other (See Comments)  Dry heaves (lasted for 14 hours)   Other Other (See Comments)    Nerve Block Tray: causes nausea/dry heaves   Tramadol Hcl Other (See Comments)    Changes personality, makes him angry     Physical Exam: Blood pressure 130/87, pulse (!) 51, temperature 97.9 F (36.6 C), temperature source Axillary, resp. rate 18, height 5\' 11"  (1.803 m), weight 165 lb 1.6 oz (74.9 kg), SpO2 100 %.  ECOG: 0    General appearance: Alert, awake without any distress. Head: Atraumatic without abnormalities Oropharynx:  Without any thrush or ulcers. Eyes: No scleral icterus. Lymph nodes: No lymphadenopathy noted in the cervical, supraclavicular, or axillary nodes Heart:regular rate and rhythm, without any murmurs or gallops.   Lung: Clear to auscultation without any rhonchi, wheezes or dullness to percussion. Abdomin: Soft, nontender without any shifting dullness or ascites. Musculoskeletal: No clubbing or cyanosis. Neurological: No motor or sensory deficits.      Lab Results: Lab Results  Component Value Date   WBC 4.3 05/10/2021   HGB 12.3 (L) 05/10/2021   HCT 36.5 (L) 05/10/2021   MCV 97.3 05/10/2021   PLT 171 05/10/2021     Chemistry      Component Value Date/Time   NA 136 05/10/2021 1100   NA 138 08/23/2020 1042   K 4.4 05/10/2021 1100   CL 103 05/10/2021 1100   CO2 27 05/10/2021 1100   BUN 21 05/10/2021 1100   BUN 16 08/23/2020 1042   CREATININE 1.34 (H) 05/10/2021 1100      Component Value Date/Time   CALCIUM 9.5 05/10/2021 1100   ALKPHOS 242 (H) 05/10/2021 1100   AST 39 05/10/2021 1100   ALT 28 05/10/2021 1100   BILITOT 0.5 05/10/2021 1100          Impression and Plan:    76 year old with:  1.  Retroperitoneal liposarcoma diagnosed in 2021.  He developed advanced disease in 2022 with pulmonary involvement.     He is currently on Doxil and has tolerated it well.  Risks and benefits of continuing this treatment were reviewed at this time.  Potential complications that include nausea, vomiting, myelosuppression, hand-foot syndrome as well as allergic reaction were reviewed.  Imaging studies obtained in January 2023 showed overall stable disease with necrotic metastatic lesions and the plan is to update staging scans in March 2023.       2.  IV access: Port-A-Cath continues to be in use without any issues.   3.  Antiemetics: No nausea or vomiting reported at this time.  Zofran available to him.   4.  Baseline cardiac evaluation: No signs or symptoms of heart failure  noted at this time.  Baseline echo showed normal findings.   5.  Goals of care: His disease is incurable although aggressive measures are warranted given his reasonable performance status.   6.  Follow-up: He will return in 3 weeks for a follow-up visit.   30  minutes were dedicated to this visit.  Time was spent on reviewing laboratory data, disease status update, treatment choices and future plan of care review.   [name], MD 2/9/20239:00 AM

## 2021-06-19 DIAGNOSIS — Z1389 Encounter for screening for other disorder: Secondary | ICD-10-CM | POA: Diagnosis not present

## 2021-06-19 DIAGNOSIS — C499 Malignant neoplasm of connective and soft tissue, unspecified: Secondary | ICD-10-CM | POA: Diagnosis not present

## 2021-06-19 DIAGNOSIS — I495 Sick sinus syndrome: Secondary | ICD-10-CM | POA: Diagnosis not present

## 2021-06-19 DIAGNOSIS — Z0000 Encounter for general adult medical examination without abnormal findings: Secondary | ICD-10-CM | POA: Diagnosis not present

## 2021-06-19 DIAGNOSIS — E7800 Pure hypercholesterolemia, unspecified: Secondary | ICD-10-CM | POA: Diagnosis not present

## 2021-06-19 DIAGNOSIS — I10 Essential (primary) hypertension: Secondary | ICD-10-CM | POA: Diagnosis not present

## 2021-06-20 ENCOUNTER — Ambulatory Visit (INDEPENDENT_AMBULATORY_CARE_PROVIDER_SITE_OTHER): Payer: Medicare HMO

## 2021-06-20 DIAGNOSIS — I495 Sick sinus syndrome: Secondary | ICD-10-CM

## 2021-06-20 LAB — CUP PACEART REMOTE DEVICE CHECK
Battery Remaining Longevity: 112 mo
Battery Remaining Percentage: 92 %
Battery Voltage: 3.02 V
Brady Statistic AP VP Percent: 1 %
Brady Statistic AP VS Percent: 57 %
Brady Statistic AS VP Percent: 1 %
Brady Statistic AS VS Percent: 41 %
Brady Statistic RA Percent Paced: 54 %
Brady Statistic RV Percent Paced: 1 %
Date Time Interrogation Session: 20230302020014
Implantable Lead Implant Date: 20220302
Implantable Lead Implant Date: 20220302
Implantable Lead Location: 753859
Implantable Lead Location: 753860
Implantable Pulse Generator Implant Date: 20220302
Lead Channel Impedance Value: 360 Ohm
Lead Channel Impedance Value: 410 Ohm
Lead Channel Pacing Threshold Amplitude: 0.5 V
Lead Channel Pacing Threshold Amplitude: 1 V
Lead Channel Pacing Threshold Pulse Width: 0.4 ms
Lead Channel Pacing Threshold Pulse Width: 0.5 ms
Lead Channel Sensing Intrinsic Amplitude: 4.4 mV
Lead Channel Sensing Intrinsic Amplitude: 9.6 mV
Lead Channel Setting Pacing Amplitude: 2 V
Lead Channel Setting Pacing Amplitude: 2.5 V
Lead Channel Setting Pacing Pulse Width: 0.5 ms
Lead Channel Setting Sensing Sensitivity: 2 mV
Pulse Gen Model: 2272
Pulse Gen Serial Number: 6376316

## 2021-06-20 MED FILL — Dexamethasone Sodium Phosphate Inj 100 MG/10ML: INTRAMUSCULAR | Qty: 1 | Status: AC

## 2021-06-21 ENCOUNTER — Inpatient Hospital Stay: Payer: Medicare HMO

## 2021-06-21 ENCOUNTER — Other Ambulatory Visit: Payer: Self-pay

## 2021-06-21 ENCOUNTER — Inpatient Hospital Stay: Payer: Medicare HMO | Admitting: Oncology

## 2021-06-21 ENCOUNTER — Inpatient Hospital Stay: Payer: Medicare HMO | Attending: Oncology

## 2021-06-21 VITALS — BP 116/88 | HR 50 | Temp 96.2°F | Resp 18 | Wt 165.1 lb

## 2021-06-21 DIAGNOSIS — Z79899 Other long term (current) drug therapy: Secondary | ICD-10-CM | POA: Diagnosis not present

## 2021-06-21 DIAGNOSIS — C480 Malignant neoplasm of retroperitoneum: Secondary | ICD-10-CM | POA: Diagnosis not present

## 2021-06-21 DIAGNOSIS — Z5111 Encounter for antineoplastic chemotherapy: Secondary | ICD-10-CM | POA: Insufficient documentation

## 2021-06-21 DIAGNOSIS — C7800 Secondary malignant neoplasm of unspecified lung: Secondary | ICD-10-CM | POA: Insufficient documentation

## 2021-06-21 DIAGNOSIS — Z95828 Presence of other vascular implants and grafts: Secondary | ICD-10-CM

## 2021-06-21 LAB — CMP (CANCER CENTER ONLY)
ALT: 23 U/L (ref 0–44)
AST: 29 U/L (ref 15–41)
Albumin: 3.7 g/dL (ref 3.5–5.0)
Alkaline Phosphatase: 230 U/L — ABNORMAL HIGH (ref 38–126)
Anion gap: 4 — ABNORMAL LOW (ref 5–15)
BUN: 16 mg/dL (ref 8–23)
CO2: 29 mmol/L (ref 22–32)
Calcium: 9.2 mg/dL (ref 8.9–10.3)
Chloride: 105 mmol/L (ref 98–111)
Creatinine: 1.3 mg/dL — ABNORMAL HIGH (ref 0.61–1.24)
GFR, Estimated: 57 mL/min — ABNORMAL LOW (ref >60)
Glucose, Bld: 82 mg/dL (ref 70–99)
Potassium: 4.1 mmol/L (ref 3.5–5.1)
Sodium: 138 mmol/L (ref 135–145)
Total Bilirubin: 0.5 mg/dL (ref 0.3–1.2)
Total Protein: 6.3 g/dL — ABNORMAL LOW (ref 6.5–8.1)

## 2021-06-21 LAB — CBC WITH DIFFERENTIAL (CANCER CENTER ONLY)
Abs Immature Granulocytes: 0.03 10*3/uL (ref 0.00–0.07)
Basophils Absolute: 0 10*3/uL (ref 0.0–0.1)
Basophils Relative: 1 %
Eosinophils Absolute: 0.2 10*3/uL (ref 0.0–0.5)
Eosinophils Relative: 5 %
HCT: 36 % — ABNORMAL LOW (ref 39.0–52.0)
Hemoglobin: 12.1 g/dL — ABNORMAL LOW (ref 13.0–17.0)
Immature Granulocytes: 1 %
Lymphocytes Relative: 20 %
Lymphs Abs: 0.7 10*3/uL (ref 0.7–4.0)
MCH: 32.9 pg (ref 26.0–34.0)
MCHC: 33.6 g/dL (ref 30.0–36.0)
MCV: 97.8 fL (ref 80.0–100.0)
Monocytes Absolute: 0.5 10*3/uL (ref 0.1–1.0)
Monocytes Relative: 14 %
Neutro Abs: 2.1 10*3/uL (ref 1.7–7.7)
Neutrophils Relative %: 59 %
Platelet Count: 149 10*3/uL — ABNORMAL LOW (ref 150–400)
RBC: 3.68 MIL/uL — ABNORMAL LOW (ref 4.22–5.81)
RDW: 13.9 % (ref 11.5–15.5)
WBC Count: 3.6 10*3/uL — ABNORMAL LOW (ref 4.0–10.5)
nRBC: 0 % (ref 0.0–0.2)

## 2021-06-21 MED ORDER — SODIUM CHLORIDE 0.9% FLUSH
10.0000 mL | INTRAVENOUS | Status: DC | PRN
  Administered 2021-06-21: 10 mL

## 2021-06-21 MED ORDER — SODIUM CHLORIDE 0.9 % IV SOLN
10.0000 mg | Freq: Once | INTRAVENOUS | Status: AC
  Administered 2021-06-21: 10 mg via INTRAVENOUS
  Filled 2021-06-21: qty 10

## 2021-06-21 MED ORDER — HEPARIN SOD (PORK) LOCK FLUSH 100 UNIT/ML IV SOLN
500.0000 [IU] | Freq: Once | INTRAVENOUS | Status: AC | PRN
  Administered 2021-06-21: 500 [IU]

## 2021-06-21 MED ORDER — SODIUM CHLORIDE 0.9% FLUSH
10.0000 mL | Freq: Once | INTRAVENOUS | Status: AC
  Administered 2021-06-21: 10 mL

## 2021-06-21 MED ORDER — DEXTROSE 5 % IV SOLN: Freq: Once | INTRAVENOUS | Status: AC

## 2021-06-21 MED ORDER — DOXORUBICIN HCL LIPOSOMAL CHEMO INJECTION 2 MG/ML
20.0000 mg/m2 | Freq: Once | INTRAVENOUS | Status: AC
  Administered 2021-06-21: 40 mg via INTRAVENOUS
  Filled 2021-06-21: qty 20

## 2021-06-21 NOTE — Progress Notes (Signed)
Hematology and Oncology Follow Up Visit ? ?Isac Sarna ?[id number] ?[birth year] 76 y.o. ?06/21/2021 9:14 AM ?[name], MDGriffin, [name], MD  ? ?Principle Diagnosis: 76 year old with advanced liposarcoma with pulmonary involvement diagnosed in September 2022.  He initially presented primary tumor in the retroperitoneal space in September 2021.    ? ? ?Prior Therapy: ?He underwent a radical resection by Dr. Barry Dienes on January 19, 2020.  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   ? ?He subsequently received adjuvant radiation therapy under the care of Dr. Lisbeth Renshaw completed on April 06, 2020.  He received a total dose of 48.6 Gray to the right abdomen.  ? ? ?Current therapy: Doxil 20 mg per metered square every 3 weeks started on February 07, 2021.  He is here for cycle 6 of therapy. ? ?Interim History: Mr. Rinehart returns today for repeat evaluation.  Since last visit, he reports no major changes in his health.  He continues to tolerate the current treatment without any major complaints or concerns.  He denies any nausea, vomiting or abdominal pain.  He denies any skin rashes or lesions.  His performance status quality of life remained reasonable.  He does have mucus discharge per rectum after bowel movements and has been noted since his radiation.  This has not changed dramatically at this time. ? ? ? ? ?Medications: Reviewed without changes. ?Current Outpatient Medications  ?Medication Sig Dispense Refill  ? acetaminophen (TYLENOL) 500 MG tablet Take 500-1,000 mg by mouth every 6 (six) hours as needed for mild pain.    ? apixaban (ELIQUIS) 5 MG TABS tablet TAKE 1 TABLET BY MOUTH TWICE A DAY 60 tablet 6  ? lidocaine-prilocaine (EMLA) cream Apply 1 application topically as needed. 30 g 0  ? lisinopril (ZESTRIL) 10 MG tablet Take 15 mg by mouth daily.    ? metoprolol succinate (TOPROL XL) 25 MG 24 hr tablet Take 1 tablet (25 mg total) by  mouth daily. 90 tablet 3  ? Multiple Vitamins-Minerals (MULTIVITAMIN WITH MINERALS) tablet Take 1 tablet by mouth in the morning.    ? ondansetron (ZOFRAN-ODT) 8 MG disintegrating tablet Take 8 mg by mouth every 8 (eight) hours as needed for nausea or vomiting.    ? Polyethyl Glycol-Propyl Glycol (SYSTANE) 0.4-0.3 % SOLN Place 1-2 drops into both eyes 3 (three) times daily as needed (dry/irritated eyes.).    ? predniSONE (DELTASONE) 50 MG tablet Pt to take 50 mg of prednisone on 01/17/21 at 8:40PM, 50 mg of prednisone on 01/18/21 at 02:40AM, and 50 mg of prednisone on 01/18/21 at 08:40AM. Pt is also to take 50 mg of benadryl on 01/18/21 at 8:40. Please call [phone] with any questions. 3 tablet 0  ? predniSONE (DELTASONE) 50 MG tablet Take 1 tablet by mouth at 4am on 05/08/21, take 1 tablet by mouth at 10am on 05/08/21, and take 1 tablet by mouth at 4pm on 05/08/21 3 tablet 0  ? rosuvastatin (CRESTOR) 10 MG tablet Take 1 tablet (10 mg total) by mouth daily. 90 tablet 3  ? triamcinolone cream (KENALOG) 0.1 % Apply 1 application topically daily as needed (itching/irritation/rash). 30 g 1  ? ?No current facility-administered medications for this visit.  ? ?Facility-Administered Medications Ordered in Other Visits  ?Medication Dose Route Frequency Provider Last Rate Last Admin  ? sodium chloride flush (NS) 0.9 % injection 10 mL  10 mL Intracatheter Once [name], MD      ? ? ? ?Allergies:  ?  Allergies  ?Allergen Reactions  ? Codeine Anaphylaxis  ? Ibuprofen Anaphylaxis  ? Robaxin [Methocarbamol] Anaphylaxis, Hives, Swelling and Rash  ? Chlorhexidine Other (See Comments)  ?  Unknown reaction  ? Contrast Media [Iodinated Contrast Media] Itching and Other (See Comments)  ?  Itching and bumps on skin.  ? Fentanyl And Related Other (See Comments)  ?  Dry heaves (lasted for 14 hours)  ? Other Other (See Comments)  ?  Nerve Block Tray: causes nausea/dry heaves  ? Tramadol Hcl Other (See Comments)  ?  Changes personality,  makes him angry  ? ? ? ?Physical Exam: ? ?Blood pressure 116/88, pulse (!) 50, temperature (!) 96.2 ?F (35.7 ?C), temperature source Tympanic, resp. rate 18, weight 165 lb 2 oz (74.9 kg), SpO2 99 %. ? ?ECOG: 0 ? ? ?General appearance: Comfortable appearing without any discomfort ?Head: Normocephalic without any trauma ?Oropharynx: Mucous membranes are moist and pink without any thrush or ulcers. ?Eyes: Pupils are equal and round reactive to light. ?Lymph nodes: No cervical, supraclavicular, inguinal or axillary lymphadenopathy.   ?Heart:regular rate and rhythm.  S1 and S2 without leg edema. ?Lung: Clear without any rhonchi or wheezes.  No dullness to percussion. ?Abdomin: Soft, nontender, nondistended with good bowel sounds.  No hepatosplenomegaly. ?Musculoskeletal: No joint deformity or effusion.  Full range of motion noted. ?Neurological: No deficits noted on motor, sensory and deep tendon reflex exam. ?Skin: No petechial rash or dryness.  Appeared moist.  ? ? ? ? ? ?Lab Results: ?Lab Results  ?Component Value Date  ? WBC 3.4 (L) 05/30/2021  ? HGB 11.6 (L) 05/30/2021  ? HCT 34.1 (L) 05/30/2021  ? MCV 98.3 05/30/2021  ? PLT 152 05/30/2021  ? ?  Chemistry   ?   ?Component Value Date/Time  ? NA 138 05/30/2021 0951  ? NA 138 08/23/2020 1042  ? K 4.6 05/30/2021 0951  ? CL 105 05/30/2021 0951  ? CO2 29 05/30/2021 0951  ? BUN 22 05/30/2021 0951  ? BUN 16 08/23/2020 1042  ? CREATININE 1.40 (H) 05/30/2021 0951  ?    ?Component Value Date/Time  ? CALCIUM 8.8 (L) 05/30/2021 0951  ? ALKPHOS 229 (H) 05/30/2021 0951  ? AST 28 05/30/2021 0951  ? ALT 22 05/30/2021 0951  ? BILITOT 0.4 05/30/2021 0951  ?  ? ? ? ? ? ? ?Impression and Plan: ? ? ? 76 year old with: ? ?1.  Advanced liposarcoma with pulmonary metastasis diagnosed in 2022.  He was found to have retroperitoneal primary 2021. ?  ?  ?The natural course of his disease was reviewed and treatment choices were discussed.  Risks and benefits of continuing Doxil were reviewed.   Complications that include infusion related reactions, hand-foot syndrome, nausea, vomiting, myelosuppression cardiac dysfunction were reiterated.  Alternative treatment options were reviewed including different salvage therapy including Votrient and gemcitabine-based regimen.  He is agreeable to proceed at this time. ?  ?  ?  ?2.  IV access: Port-A-Cath continues to be in use without any issues. ?  ?3.  Antiemetics: Zofran is available to home without any nausea or vomiting. ?  ?4.  Cardiomyopathy evaluation: His baseline echo was normal we will continue to monitor on subsequent visits.  No signs or symptoms of heart failure. ?  ?5.  Goals of care: Therapy is palliative though aggressive measures are warranted. ?  ?6.  Follow-up: In 3 weeks for a follow-up visit. ?  ?30  minutes were spent on this encounter.  The time  was dedicated to reviewing laboratory data, disease status update and outlining future plan of care. ? ? ?[name], MD ?3/3/20239:14 AM ? ?

## 2021-06-21 NOTE — Patient Instructions (Signed)
[facility]  Discharge Instructions: ?Thank you for choosing [facility] to provide your oncology and hematology care.  ? ?If you have a lab appointment with the [facility], please go directly to the [facility] and check in at the registration area. ?  ?Wear comfortable clothing and clothing appropriate for easy access to any Portacath or PICC line.  ? ?We strive to give you quality time with your provider. You may need to reschedule your appointment if you arrive late (15 or more minutes).  Arriving late affects you and other patients whose appointments are after yours.  Also, if you miss three or more appointments without notifying the office, you may be dismissed from the clinic at the provider?s discretion.    ?  ?For prescription refill requests, have your pharmacy contact our office and allow 72 hours for refills to be completed.   ? ?Today you received the following chemotherapy and/or immunotherapy agents doxorubicin liposomal    ?  ?To help prevent nausea and vomiting after your treatment, we encourage you to take your nausea medication as directed. ? ?BELOW ARE SYMPTOMS THAT SHOULD BE REPORTED IMMEDIATELY: ?*FEVER GREATER THAN 100.4 F (38 ?C) OR HIGHER ?*CHILLS OR SWEATING ?*NAUSEA AND VOMITING THAT IS NOT CONTROLLED WITH YOUR NAUSEA MEDICATION ?*UNUSUAL SHORTNESS OF BREATH ?*UNUSUAL BRUISING OR BLEEDING ?*URINARY PROBLEMS (pain or burning when urinating, or frequent urination) ?*BOWEL PROBLEMS (unusual diarrhea, constipation, pain near the anus) ?TENDERNESS IN MOUTH AND THROAT WITH OR WITHOUT PRESENCE OF ULCERS (sore throat, sores in mouth, or a toothache) ?UNUSUAL RASH, SWELLING OR PAIN  ?UNUSUAL VAGINAL DISCHARGE OR ITCHING  ? ?Items with * indicate a potential emergency and should be followed up as soon as possible or go to the Emergency Department if any problems should occur. ? ?Please show the CHEMOTHERAPY ALERT CARD or IMMUNOTHERAPY ALERT CARD at  check-in to the Emergency Department and triage nurse. ? ?Should you have questions after your visit or need to cancel or reschedule your appointment, please contact [facility]  Dept: [phone]  and follow the prompts.  Office hours are 8:00 a.m. to 4:30 p.m. Monday - Friday. Please note that voicemails left after 4:00 p.m. may not be returned until the following business day.  We are closed weekends and major holidays. You have access to a nurse at all times for urgent questions. Please call the main number to the clinic Dept: [phone] and follow the prompts. ? ? ?For any non-urgent questions, you may also contact your provider using MyChart. We now offer e-Visits for anyone 20 and older to request care online for non-urgent symptoms. For details visit [email or web address]. ?  ?Also download the MyChart app! Go to the app store, search "MyChart", open the app, select [facility], and log in with your MyChart username and password. ? ?Due to Covid, a mask is required upon entering the hospital/clinic. If you do not have a mask, one will be given to you upon arrival. For doctor visits, patients may have 1 support person aged 63 or older with them. For treatment visits, patients cannot have anyone with them due to current Covid guidelines and our immunocompromised population.  ? ?

## 2021-06-27 NOTE — Progress Notes (Signed)
Remote pacemaker transmission.   

## 2021-06-28 ENCOUNTER — Other Ambulatory Visit: Payer: Self-pay | Admitting: *Deleted

## 2021-06-28 DIAGNOSIS — C480 Malignant neoplasm of retroperitoneum: Secondary | ICD-10-CM

## 2021-07-09 ENCOUNTER — Ambulatory Visit (HOSPITAL_COMMUNITY): Payer: Medicare HMO

## 2021-07-09 ENCOUNTER — Other Ambulatory Visit: Payer: Self-pay

## 2021-07-09 ENCOUNTER — Ambulatory Visit (HOSPITAL_COMMUNITY)
Admission: RE | Admit: 2021-07-09 | Discharge: 2021-07-09 | Disposition: A | Discharge status: Home / Self Care | Payer: Medicare HMO | Source: Ambulatory Visit | Attending: Oncology | Admitting: Oncology

## 2021-07-09 DIAGNOSIS — I2510 Atherosclerotic heart disease of native coronary artery without angina pectoris: Secondary | ICD-10-CM | POA: Diagnosis not present

## 2021-07-09 DIAGNOSIS — J9811 Atelectasis: Secondary | ICD-10-CM | POA: Diagnosis not present

## 2021-07-09 DIAGNOSIS — M47814 Spondylosis without myelopathy or radiculopathy, thoracic region: Secondary | ICD-10-CM | POA: Diagnosis not present

## 2021-07-09 DIAGNOSIS — C7800 Secondary malignant neoplasm of unspecified lung: Secondary | ICD-10-CM | POA: Diagnosis not present

## 2021-07-09 DIAGNOSIS — C480 Malignant neoplasm of retroperitoneum: Secondary | ICD-10-CM | POA: Diagnosis not present

## 2021-07-09 DIAGNOSIS — I700 Atherosclerosis of aorta: Secondary | ICD-10-CM | POA: Diagnosis not present

## 2021-07-09 DIAGNOSIS — M47816 Spondylosis without myelopathy or radiculopathy, lumbar region: Secondary | ICD-10-CM | POA: Diagnosis not present

## 2021-07-11 ENCOUNTER — Inpatient Hospital Stay: Payer: Medicare HMO | Admitting: Oncology

## 2021-07-11 ENCOUNTER — Other Ambulatory Visit: Payer: Self-pay

## 2021-07-11 ENCOUNTER — Inpatient Hospital Stay: Payer: Medicare HMO

## 2021-07-11 ENCOUNTER — Telehealth: Payer: Self-pay | Admitting: Radiation Oncology

## 2021-07-11 VITALS — BP 132/88 | HR 97 | Temp 97.9°F | Resp 18 | Ht 71.0 in | Wt 162.9 lb

## 2021-07-11 DIAGNOSIS — C480 Malignant neoplasm of retroperitoneum: Secondary | ICD-10-CM

## 2021-07-11 DIAGNOSIS — Z95828 Presence of other vascular implants and grafts: Secondary | ICD-10-CM

## 2021-07-11 DIAGNOSIS — C7800 Secondary malignant neoplasm of unspecified lung: Secondary | ICD-10-CM | POA: Diagnosis not present

## 2021-07-11 DIAGNOSIS — Z5111 Encounter for antineoplastic chemotherapy: Secondary | ICD-10-CM | POA: Diagnosis not present

## 2021-07-11 DIAGNOSIS — Z79899 Other long term (current) drug therapy: Secondary | ICD-10-CM | POA: Diagnosis not present

## 2021-07-11 LAB — CBC WITH DIFFERENTIAL (CANCER CENTER ONLY)
Abs Immature Granulocytes: 0.02 K/uL (ref 0.00–0.07)
Basophils Absolute: 0 K/uL (ref 0.0–0.1)
Basophils Relative: 1 %
Eosinophils Absolute: 0.3 K/uL (ref 0.0–0.5)
Eosinophils Relative: 7 %
HCT: 34.7 % — ABNORMAL LOW (ref 39.0–52.0)
Hemoglobin: 11.8 g/dL — ABNORMAL LOW (ref 13.0–17.0)
Immature Granulocytes: 1 %
Lymphocytes Relative: 16 %
Lymphs Abs: 0.7 K/uL (ref 0.7–4.0)
MCH: 33.3 pg (ref 26.0–34.0)
MCHC: 34 g/dL (ref 30.0–36.0)
MCV: 98 fL (ref 80.0–100.0)
Monocytes Absolute: 0.7 K/uL (ref 0.1–1.0)
Monocytes Relative: 16 %
Neutro Abs: 2.5 K/uL (ref 1.7–7.7)
Neutrophils Relative %: 59 %
Platelet Count: 157 K/uL (ref 150–400)
RBC: 3.54 MIL/uL — ABNORMAL LOW (ref 4.22–5.81)
RDW: 13.5 % (ref 11.5–15.5)
WBC Count: 4.1 K/uL (ref 4.0–10.5)
nRBC: 0 % (ref 0.0–0.2)

## 2021-07-11 LAB — CMP (CANCER CENTER ONLY)
ALT: 22 U/L (ref 0–44)
AST: 28 U/L (ref 15–41)
Albumin: 3.6 g/dL (ref 3.5–5.0)
Alkaline Phosphatase: 253 U/L — ABNORMAL HIGH (ref 38–126)
Anion gap: 6 (ref 5–15)
BUN: 22 mg/dL (ref 8–23)
CO2: 27 mmol/L (ref 22–32)
Calcium: 9.2 mg/dL (ref 8.9–10.3)
Chloride: 105 mmol/L (ref 98–111)
Creatinine: 1.28 mg/dL — ABNORMAL HIGH (ref 0.61–1.24)
GFR, Estimated: 58 mL/min — ABNORMAL LOW (ref >60)
Glucose, Bld: 99 mg/dL (ref 70–99)
Potassium: 4.3 mmol/L (ref 3.5–5.1)
Sodium: 138 mmol/L (ref 135–145)
Total Bilirubin: 0.5 mg/dL (ref 0.3–1.2)
Total Protein: 6.1 g/dL — ABNORMAL LOW (ref 6.5–8.1)

## 2021-07-11 MED ORDER — DEXTROSE 5 % IV SOLN: Freq: Once | INTRAVENOUS | Status: AC

## 2021-07-11 MED ORDER — DOXORUBICIN HCL LIPOSOMAL CHEMO INJECTION 2 MG/ML
20.0000 mg/m2 | Freq: Once | INTRAVENOUS | Status: AC
  Administered 2021-07-11: 40 mg via INTRAVENOUS
  Filled 2021-07-11: qty 20

## 2021-07-11 MED ORDER — SODIUM CHLORIDE 0.9 % IV SOLN
10.0000 mg | Freq: Once | INTRAVENOUS | Status: AC
  Administered 2021-07-11: 10 mg via INTRAVENOUS
  Filled 2021-07-11: qty 10

## 2021-07-11 MED ORDER — HYDROCODONE-ACETAMINOPHEN 5-325 MG PO TABS: 1.0000 | ORAL_TABLET | Freq: Four times a day (QID) | ORAL | 0 refills | Status: AC | PRN

## 2021-07-11 MED ORDER — SODIUM CHLORIDE 0.9% FLUSH
10.0000 mL | Freq: Once | INTRAVENOUS | Status: AC
  Administered 2021-07-11: 10 mL

## 2021-07-11 NOTE — Telephone Encounter (Signed)
Called patient to schedule a follow-up appointment with William Avila. No answer, LVM for a return call. ?

## 2021-07-11 NOTE — Patient Instructions (Signed)
[facility]  Discharge Instructions: ?Thank you for choosing [facility] to provide your oncology and hematology care.  ? ?If you have a lab appointment with the [facility], please go directly to the [facility] and check in at the registration area. ?  ?Wear comfortable clothing and clothing appropriate for easy access to any Portacath or PICC line.  ? ?We strive to give you quality time with your provider. You may need to reschedule your appointment if you arrive late (15 or more minutes).  Arriving late affects you and other patients whose appointments are after yours.  Also, if you miss three or more appointments without notifying the office, you may be dismissed from the clinic at the provider?s discretion.    ?  ?For prescription refill requests, have your pharmacy contact our office and allow 72 hours for refills to be completed.   ? ?Today you received the following chemotherapy and/or immunotherapy agents doxorubicin liposomal    ?  ?To help prevent nausea and vomiting after your treatment, we encourage you to take your nausea medication as directed. ? ?BELOW ARE SYMPTOMS THAT SHOULD BE REPORTED IMMEDIATELY: ?*FEVER GREATER THAN 100.4 F (38 ?C) OR HIGHER ?*CHILLS OR SWEATING ?*NAUSEA AND VOMITING THAT IS NOT CONTROLLED WITH YOUR NAUSEA MEDICATION ?*UNUSUAL SHORTNESS OF BREATH ?*UNUSUAL BRUISING OR BLEEDING ?*URINARY PROBLEMS (pain or burning when urinating, or frequent urination) ?*BOWEL PROBLEMS (unusual diarrhea, constipation, pain near the anus) ?TENDERNESS IN MOUTH AND THROAT WITH OR WITHOUT PRESENCE OF ULCERS (sore throat, sores in mouth, or a toothache) ?UNUSUAL RASH, SWELLING OR PAIN  ?UNUSUAL VAGINAL DISCHARGE OR ITCHING  ? ?Items with * indicate a potential emergency and should be followed up as soon as possible or go to the Emergency Department if any problems should occur. ? ?Please show the CHEMOTHERAPY ALERT CARD or IMMUNOTHERAPY ALERT CARD at  check-in to the Emergency Department and triage nurse. ? ?Should you have questions after your visit or need to cancel or reschedule your appointment, please contact [facility]  Dept: [phone]  and follow the prompts.  Office hours are 8:00 a.m. to 4:30 p.m. Monday - Friday. Please note that voicemails left after 4:00 p.m. may not be returned until the following business day.  We are closed weekends and major holidays. You have access to a nurse at all times for urgent questions. Please call the main number to the clinic Dept: [phone] and follow the prompts. ? ? ?For any non-urgent questions, you may also contact your provider using MyChart. We now offer e-Visits for anyone 42 and older to request care online for non-urgent symptoms. For details visit [email or web address]. ?  ?Also download the MyChart app! Go to the app store, search "MyChart", open the app, select [facility], and log in with your MyChart username and password. ? ?Due to Covid, a mask is required upon entering the hospital/clinic. If you do not have a mask, one will be given to you upon arrival. For doctor visits, patients may have 1 support person aged 8 or older with them. For treatment visits, patients cannot have anyone with them due to current Covid guidelines and our immunocompromised population.  ? ?

## 2021-07-11 NOTE — Progress Notes (Signed)
Hematology and Oncology Follow Up Visit ? ?William Avila ?[id number] ?12/18/1945 76 y.o. ?07/11/2021 8:32 AM ?William Avila, MDGriffin, William Reichmann, MD  ? ?Principle Diagnosis: 76 year old with retroperitoneal liposarcoma diagnosed in September 2021.  He developed advanced disease with pulmonary involvement in September 2022. ? ? ?Prior Therapy: ?He underwent a radical resection by Dr. Barry Dienes on January 19, 2020.  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   ? ?He subsequently received adjuvant radiation therapy under the care of Dr. Lisbeth Renshaw completed on April 06, 2020.  He received a total dose of 48.6 Gray to the right abdomen.  ? ? ?Current therapy: Doxil 20 mg per metered square every 3 weeks started on February 07, 2021.  He is here for cycle 7 of therapy. ? ?Interim History: William Avila is here for a follow-up visit.  Since last visit, he reports no major changes in his health.  He has reported more pain and discomfort around the left sidewall of his chest but no shortness of breath, wheezing or difficulty breathing.  He denies any hospitalizations or illnesses.  He tolerated Doxil without any major complaints. ? ? ? ? ?Medications: Updated on review. ?Current Outpatient Medications  ?Medication Sig Dispense Refill  ? acetaminophen (TYLENOL) 500 MG tablet Take 500-1,000 mg by mouth every 6 (six) hours as needed for mild pain.    ? apixaban (ELIQUIS) 5 MG TABS tablet TAKE 1 TABLET BY MOUTH TWICE A DAY 60 tablet 6  ? lidocaine-prilocaine (EMLA) cream Apply 1 application topically as needed. 30 g 0  ? lisinopril (ZESTRIL) 10 MG tablet Take 15 mg by mouth daily.    ? metoprolol succinate (TOPROL XL) 25 MG 24 hr tablet Take 1 tablet (25 mg total) by mouth daily. 90 tablet 3  ? Multiple Vitamins-Minerals (MULTIVITAMIN WITH MINERALS) tablet Take 1 tablet by mouth in the morning.    ? ondansetron (ZOFRAN-ODT) 8 MG disintegrating tablet Take 8 mg by  mouth every 8 (eight) hours as needed for nausea or vomiting.    ? Polyethyl Glycol-Propyl Glycol (SYSTANE) 0.4-0.3 % SOLN Place 1-2 drops into both eyes 3 (three) times daily as needed (dry/irritated eyes.).    ? predniSONE (DELTASONE) 50 MG tablet Pt to take 50 mg of prednisone on 01/17/21 at 8:40PM, 50 mg of prednisone on 01/18/21 at 02:40AM, and 50 mg of prednisone on 01/18/21 at 08:40AM. Pt is also to take 50 mg of benadryl on 01/18/21 at 8:40. Please call [phone] with any questions. 3 tablet 0  ? predniSONE (DELTASONE) 50 MG tablet Take 1 tablet by mouth at 4am on 05/08/21, take 1 tablet by mouth at 10am on 05/08/21, and take 1 tablet by mouth at 4pm on 05/08/21 3 tablet 0  ? rosuvastatin (CRESTOR) 10 MG tablet Take 1 tablet (10 mg total) by mouth daily. 90 tablet 3  ? triamcinolone cream (KENALOG) 0.1 % Apply 1 application topically daily as needed (itching/irritation/rash). 30 g 1  ? ?No current facility-administered medications for this visit.  ? ?Facility-Administered Medications Ordered in Other Visits  ?Medication Dose Route Frequency Provider Last Rate Last Admin  ? sodium chloride flush (NS) 0.9 % injection 10 mL  10 mL Intracatheter Once William Portela, MD      ? ? ? ?Allergies:  ?Allergies  ?Allergen Reactions  ? Codeine Anaphylaxis  ? Ibuprofen Anaphylaxis  ? Robaxin [Methocarbamol] Anaphylaxis, Hives, Swelling and Rash  ? Chlorhexidine Other (See Comments)  ?  Unknown reaction  ? Contrast  Media [Iodinated Contrast Media] Itching and Other (See Comments)  ?  Itching and bumps on skin.  ? Fentanyl And Related Other (See Comments)  ?  Dry heaves (lasted for 14 hours)  ? Other Other (See Comments)  ?  Nerve Block Tray: causes nausea/dry heaves  ? Tramadol Hcl Other (See Comments)  ?  Changes personality, makes him angry  ? ? ? ?Physical Exam: ? ?Blood pressure 132/88, pulse 97, temperature 97.9 ?F (36.6 ?C), temperature source Temporal, resp. rate 18, height 5\' 11"  (1.803 m), weight 162 lb 14.4 oz (73.9  kg), SpO2 100 %. ? ? ?ECOG: 1 ? ? ?General appearance: Alert, awake without any distress. ?Head: Atraumatic without abnormalities ?Oropharynx: Without any thrush or ulcers. ?Eyes: No scleral icterus. ?Lymph nodes: No lymphadenopathy noted in the cervical, supraclavicular, or axillary nodes ?Heart:regular rate and rhythm, without any murmurs or gallops.   ?Lung: Clear to auscultation without any rhonchi, wheezes or dullness to percussion. ?Abdomin: Soft, nontender without any shifting dullness or ascites. ?Musculoskeletal: No clubbing or cyanosis. ?Neurological: No motor or sensory deficits. ?Skin: No rashes or lesions. ? ? ? ? ? ? ?Lab Results: ?Lab Results  ?Component Value Date  ? WBC 3.6 (L) 06/21/2021  ? HGB 12.1 (L) 06/21/2021  ? HCT 36.0 (L) 06/21/2021  ? MCV 97.8 06/21/2021  ? PLT 149 (L) 06/21/2021  ? ?  Chemistry   ?   ?Component Value Date/Time  ? NA 138 06/21/2021 0914  ? NA 138 08/23/2020 1042  ? K 4.1 06/21/2021 0914  ? CL 105 06/21/2021 0914  ? CO2 29 06/21/2021 0914  ? BUN 16 06/21/2021 0914  ? BUN 16 08/23/2020 1042  ? CREATININE 1.30 (H) 06/21/2021 0914  ?    ?Component Value Date/Time  ? CALCIUM 9.2 06/21/2021 0914  ? ALKPHOS 230 (H) 06/21/2021 0914  ? AST 29 06/21/2021 0914  ? ALT 23 06/21/2021 0914  ? BILITOT 0.5 06/21/2021 0914  ?  ? ? ?IMPRESSION: ?Multifocal pulmonary metastases, with overall mild progression, as ?above. ?  ?Postsurgical changes in the right abdomen. ?  ?Prostatomegaly, suggesting BPH. Thick-walled bladder, possibly ?reflecting chronic bladder outlet obstruction. ?  ?Additional ancillary findings as above. ?  ?  ? ? ? ?Impression and Plan: ? ? ? 76 year old with: ? ?1.  Retroperitoneal advanced liposarcoma with pulmonary metastasis diagnosed in 2022.   ?  ?The natural course of this disease was reviewed at this time and treatment choices were discussed.  CT scan obtained on July 09, 2021 showed overall stable disease with mild progression.  Risks and benefits of continuing  Doxil versus switching to a different regimen were reviewed at this time.  Votrient would be another option at this time but I feel the benefit would be marginal as well.  After discussion we opted to continue the same dose and schedule. ? ?  ?  ?  ?2.  IV access: Port-A-Cath in use without complication related to its use. ?  ?3.  Antiemetics: No nausea or vomiting reported at this time. ?  ?4.  Cardiomyopathy evaluation: No cardiac complications noted at this time.  Baseline echo normal range. ?  ?5.  Goals of care: Aggressive measures are warranted given his reasonable performance status.  Prognosis from his cancer was discussed today in detail.  He understands he is not incurable malignancy that would likely take over his body in the near future.  Life expectancy is limited likely double-digit months. ? ?6.  Flank pain: Prescription for  hydrocodone will be available to him.  We will refer to radiation oncology for palliative considerations of his left-sided chest wall pain. ? ? ?7.  Follow-up: He will return in 3 weeks for repeat follow-up. ?  ?30  minutes were spent on this visit.  The time was dedicated to reviewing laboratory data, disease status update and outlining future plan of care discussion. ? ? ?[name], MD ?3/23/20238:32 AM ? ?

## 2021-07-13 ENCOUNTER — Encounter: Payer: Self-pay | Admitting: Oncology

## 2021-07-15 DIAGNOSIS — C7800 Secondary malignant neoplasm of unspecified lung: Secondary | ICD-10-CM | POA: Insufficient documentation

## 2021-07-15 NOTE — Progress Notes (Signed)
?Radiation Oncology         ([phone]) [phone] ?________________________________ ? ?Outpatient Re-Consultation - Conducted via telephone due to current COVID-19 concerns for limiting patient exposure ? ?I spoke with the patient to conduct this consult visit via telephone to spare the patient unnecessary potential exposure in the healthcare setting during the current COVID-19 pandemic. The patient was notified in advance and was offered a [facility] meeting to allow for face to face communication but unfortunately reported that they did not have the appropriate resources/technology to support such a visit and instead preferred to proceed with a telephone visit. ? ?Name: William Avila        MRN: [id number]  ?Date of Service: 07/17/2021 DOB: 10-12-1945 ? ?VF:IEPPIRJ, [name], MD  [name], MD    ? ?REFERRING PHYSICIAN: [name], MD ? ? ?DIAGNOSIS: The primary encounter diagnosis was Liposarcoma, retroperitoneal ([place]). A diagnosis of Malignant neoplasm metastatic to both lungs [facility]) was also pertinent to this visit. ? ? ?HISTORY OF PRESENT ILLNESS: William Avila is a 76 y.o. male with a history of liposarcoma of the retroperitoneum.  He was found to have 11.1 cm mass in the retroperitoneum, and underwent left inguinal hernia repair with mesh and radical resection of the right retroperitoneal mass in 2021.  He met postoperatively with Dr. Barry Dienes, and was having improvement of his swollen left groin but still tenderness.  He also has since undergone a CT of the chest for staging on 02/07/2020, this captured postoperative changes in the right retroperitoneal region, and no other abnormality seen in the chest of concern other than his known stigmata of aortic atherosclerotic disease.  His pathology was reviewed in an outside facility at [facility] and [facility] and to be consistent with a high-grade liposarcoma with differentiation and positive margins.  Given the high risk for local recurrence he received adjuvant  radiotherapy.  Since his treatment however he developed pulmonary metastases and has been receiving Doxil chemotherapy. Recent CT scan with Dr. Alen Blew on 07/09/2021  has shown some extent otherwise progressive disease. In the right upper lobe/perihilar area his known disease previously was 6.1 cm now was 4.7, otherwise progression was seen in the right minor fissure measuring 4.3 cm previously 3.1, right minor fissure second nodule measuring 2.2 cm previously 1.2 cm, and central left lower lobe nodule previously 7.7 cm now measuring 8 cm and a new 2.6 cm pleural-based mass in the lingula.  The patient is having significant chest wall discomfort from these areas and is seen to consider palliative radiotherapy.  He is continuing on with Doxil and his next infusion is due on 08/01/2021. ? ? ?PREVIOUS RADIATION THERAPY:  ? ?02/27/20 - 04/06/20: ?Site/dose:   The right abdomen was treated initially to a dose of 45 Gy in 25 fractions using a 4-field IMRT plan. A 3.6 Gy boost in 3 fractions was then delivered yielding a total does of 48.6 Gy. ? ?PAST MEDICAL HISTORY:  ?Past Medical History:  ?Diagnosis Date  ? Acute appendicitis 01/21/2014  ? Arthritis   ? Dysrhythmia   ? PSVT, bradycardia  ? History of hiatal hernia   ? Hypertension   ? Presence of permanent cardiac pacemaker   ?   ? ? ?PAST SURGICAL HISTORY: ?Past Surgical History:  ?Procedure Laterality Date  ? BACK SURGERY    ? and neck and trigger finger surgery  ? CARDIOVERSION N/A 08/29/2020  ? Procedure: CARDIOVERSION;  Surgeon: Sanda Klein, MD;  Location: [place];  Service: Cardiovascular;  Laterality: N/A;  ? CERVICAL SPINE SURGERY    ? ESOPHAGOGASTRODUODENOSCOPY (EGD) WITH PROPOFOL Left 12/24/2015  ? Procedure: ESOPHAGOGASTRODUODENOSCOPY (EGD) WITH PROPOFOL;  Surgeon: Otis Brace, MD;  Location: [place];  Service: Gastroenterology;  Laterality: Left;  ? HIATAL HERNIA REPAIR N/A 12/26/2015  ? Procedure: LAPAROSCOPIC REPAIR OF HIATAL HERNIA WITH MESH;   Surgeon: Michael Boston, MD;  Location: [place];  Service: General;  Laterality: N/A;  ? INGUINAL HERNIA REPAIR Left 01/19/2020  ? Procedure: LEFT INGUINAL HERNIA REPAIR WITH MESH;  Surgeon: Stark Klein, MD;  Location: [place];  Service: General;  Laterality: Left;  ? INSERTION OF MESH Left 01/19/2020  ? Procedure: INSERTION OF MESH;  Surgeon: Stark Klein, MD;  Location: [place];  Service: General;  Laterality: Left;  ? IR IMAGING GUIDED PORT INSERTION  02/05/2021  ? LAPAROSCOPIC APPENDECTOMY N/A 01/21/2014  ? Procedure: APPENDECTOMY LAPAROSCOPIC;  Surgeon: Autumn Messing III, MD;  Location: [place];  Service: General;  Laterality: N/A;  ? LEAD REVISION/REPAIR N/A 06/26/2020  ? Procedure: LEAD REVISION/REPAIR;  Surgeon: [name], MD;  Location: [place] [facility];  Service: Cardiovascular;  Laterality: N/A;  ? PACEMAKER IMPLANT N/A 06/20/2020  ? Procedure: PACEMAKER IMPLANT;  Surgeon: Evans Lance, MD;  Location: [place] [facility];  Service: Cardiovascular;  Laterality: N/A;  ? RESECTION OF RETROPERITONEAL MASS N/A 01/19/2020  ? Procedure: 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;  Surgeon: Stark Klein, MD;  Location: [place];  Service: General;  Laterality: N/A;  ? TRIGGER FINGER RELEASE Left 05/2019  ? ? ? ?FAMILY HISTORY:  ?Family History  ?Problem Relation Age of Onset  ? Prostate cancer Brother   ? Cancer Paternal Uncle   ?     Unknown what type  ? ? ? ?SOCIAL HISTORY:  reports that he has never smoked. He has never used smokeless tobacco. He reports current alcohol use. He reports that he does not use drugs.  The patient is married and lives in [place].  He is retired from working as a Games developer but has been on medical disability due to spinal stenosis.  ? ? ?ALLERGIES: Codeine, Ibuprofen, Robaxin [methocarbamol], Chlorhexidine, Contrast media [iodinated contrast media], Fentanyl and related, Other, and Tramadol hcl ? ? ?MEDICATIONS:  ?Current Outpatient Medications  ?Medication  Sig Dispense Refill  ? acetaminophen (TYLENOL) 500 MG tablet Take 500-1,000 mg by mouth every 6 (six) hours as needed for mild pain.    ? apixaban (ELIQUIS) 5 MG TABS tablet TAKE 1 TABLET BY MOUTH TWICE A DAY 60 tablet 6  ? HYDROcodone-acetaminophen (NORCO) 5-325 MG tablet Take 1 tablet by mouth every 6 (six) hours as needed for moderate pain. 30 tablet 0  ? lidocaine-prilocaine (EMLA) cream Apply 1 application topically as needed. 30 g 0  ? lisinopril (ZESTRIL) 10 MG tablet Take 15 mg by mouth daily.    ? metoprolol succinate (TOPROL XL) 25 MG 24 hr tablet Take 1 tablet (25 mg total) by mouth daily. 90 tablet 3  ? Multiple Vitamins-Minerals (MULTIVITAMIN WITH MINERALS) tablet Take 1 tablet by mouth in the morning.    ? ondansetron (ZOFRAN-ODT) 8 MG disintegrating tablet Take 8 mg by mouth every 8 (eight) hours as needed for nausea or vomiting.    ? Polyethyl Glycol-Propyl Glycol (SYSTANE) 0.4-0.3 % SOLN Place 1-2 drops into both eyes 3 (three) times daily as needed (dry/irritated eyes.).    ? predniSONE (DELTASONE) 50 MG tablet Pt to take 50 mg of prednisone on 01/17/21 at  8:40PM, 50 mg of prednisone on 01/18/21 at 02:40AM, and 50 mg of prednisone on 01/18/21 at 08:40AM. Pt is also to take 50 mg of benadryl on 01/18/21 at 8:40. Please call [phone] with any questions. 3 tablet 0  ? predniSONE (DELTASONE) 50 MG tablet Take 1 tablet by mouth at 4am on 05/08/21, take 1 tablet by mouth at 10am on 05/08/21, and take 1 tablet by mouth at 4pm on 05/08/21 3 tablet 0  ? rosuvastatin (CRESTOR) 10 MG tablet Take 1 tablet (10 mg total) by mouth daily. 90 tablet 3  ? triamcinolone cream (KENALOG) 0.1 % Apply 1 application topically daily as needed (itching/irritation/rash). 30 g 1  ? ?No current facility-administered medications for this encounter.  ? ? ? ?REVIEW OF SYSTEMS: On review of systems, the patient reports that he is doing well overall. He has some significant discomfort in the left chest wall.  He describes this as  being quite uncomfortable when he is trying to lay on his side or on his back at nighttime.  He describes a difficulty in feeling like he cannot fully expand his chest wall to take in a deep breath witho

## 2021-07-16 ENCOUNTER — Encounter: Payer: Self-pay | Admitting: Radiation Oncology

## 2021-07-16 NOTE — Progress Notes (Addendum)
FUN-telephone appointment. I spoke w/ patient's spouse Stafford Riviera, verified her identity and began nursing interview. She reports that patient is having some mild chest/ABD pain 3/10. No other issues reported to me at this time. ? ?Meaningful use complete. ? ?Reminded her of Mr. Lawhorn 1:30pm-07/17/21 telephone appointment w/ Shona Simpson PA-C. I left my extension [phone] in case patient needs anything. ? ?Patient contact [phone] ?

## 2021-07-17 ENCOUNTER — Ambulatory Visit
Admission: RE | Admit: 2021-07-17 | Discharge: 2021-07-17 | Disposition: A | Discharge status: Home / Self Care | Payer: Medicare HMO | Source: Ambulatory Visit | Attending: Radiation Oncology | Admitting: Radiation Oncology

## 2021-07-17 DIAGNOSIS — C480 Malignant neoplasm of retroperitoneum: Secondary | ICD-10-CM

## 2021-07-17 DIAGNOSIS — C7802 Secondary malignant neoplasm of left lung: Secondary | ICD-10-CM | POA: Diagnosis not present

## 2021-07-17 DIAGNOSIS — C7801 Secondary malignant neoplasm of right lung: Secondary | ICD-10-CM

## 2021-07-18 ENCOUNTER — Ambulatory Visit
Admission: RE | Admit: 2021-07-18 | Discharge: 2021-07-18 | Disposition: A | Discharge status: Home / Self Care | Payer: Medicare HMO | Source: Ambulatory Visit | Attending: Radiation Oncology | Admitting: Radiation Oncology

## 2021-07-18 ENCOUNTER — Other Ambulatory Visit: Payer: Self-pay

## 2021-07-18 DIAGNOSIS — C7802 Secondary malignant neoplasm of left lung: Secondary | ICD-10-CM | POA: Diagnosis not present

## 2021-07-18 DIAGNOSIS — C480 Malignant neoplasm of retroperitoneum: Secondary | ICD-10-CM | POA: Diagnosis not present

## 2021-07-21 DIAGNOSIS — C7802 Secondary malignant neoplasm of left lung: Secondary | ICD-10-CM | POA: Diagnosis not present

## 2021-07-21 DIAGNOSIS — C480 Malignant neoplasm of retroperitoneum: Secondary | ICD-10-CM | POA: Diagnosis not present

## 2021-07-22 ENCOUNTER — Ambulatory Visit
Admission: RE | Admit: 2021-07-22 | Discharge: 2021-07-22 | Disposition: A | Discharge status: Home / Self Care | Payer: Medicare HMO | Source: Ambulatory Visit | Attending: Radiation Oncology | Admitting: Radiation Oncology

## 2021-07-22 ENCOUNTER — Other Ambulatory Visit: Payer: Self-pay

## 2021-07-22 DIAGNOSIS — C7802 Secondary malignant neoplasm of left lung: Secondary | ICD-10-CM | POA: Diagnosis not present

## 2021-07-22 DIAGNOSIS — C480 Malignant neoplasm of retroperitoneum: Secondary | ICD-10-CM | POA: Diagnosis not present

## 2021-07-22 DIAGNOSIS — Z510 Encounter for antineoplastic radiation therapy: Secondary | ICD-10-CM | POA: Diagnosis not present

## 2021-07-22 NOTE — Progress Notes (Signed)
Per discussion on 07/22/2021 with Dr. Lovena Le, William Avila is cleared to proceed with radiation therapy to his chest.  Treatment machine made aware. ? ? ? ?Gloriajean Dell. Leonie Green, BSN  ?

## 2021-07-23 ENCOUNTER — Ambulatory Visit
Admission: RE | Admit: 2021-07-23 | Discharge: 2021-07-23 | Disposition: A | Discharge status: Home / Self Care | Payer: Medicare HMO | Source: Ambulatory Visit | Attending: Radiation Oncology | Admitting: Radiation Oncology

## 2021-07-23 ENCOUNTER — Other Ambulatory Visit: Payer: Self-pay | Admitting: Internal Medicine

## 2021-07-23 DIAGNOSIS — Z510 Encounter for antineoplastic radiation therapy: Secondary | ICD-10-CM | POA: Diagnosis not present

## 2021-07-23 DIAGNOSIS — I4819 Other persistent atrial fibrillation: Secondary | ICD-10-CM

## 2021-07-23 DIAGNOSIS — C7802 Secondary malignant neoplasm of left lung: Secondary | ICD-10-CM | POA: Diagnosis not present

## 2021-07-23 DIAGNOSIS — C480 Malignant neoplasm of retroperitoneum: Secondary | ICD-10-CM | POA: Diagnosis not present

## 2021-07-23 NOTE — Telephone Encounter (Signed)
Pt last saw Dr Gardiner Rhyme 03/20/21, last labs 07/11/21 Creat 1.28, age 76, weight 73.9kg, based on specified criteria pt is on appropriate dosage of Eliquis 5mg  BID for afib.  Will refill rx.  ?

## 2021-07-24 ENCOUNTER — Ambulatory Visit
Admission: RE | Admit: 2021-07-24 | Discharge: 2021-07-24 | Disposition: A | Discharge status: Home / Self Care | Payer: Medicare HMO | Source: Ambulatory Visit | Attending: Radiation Oncology | Admitting: Radiation Oncology

## 2021-07-24 ENCOUNTER — Other Ambulatory Visit: Payer: Self-pay

## 2021-07-24 DIAGNOSIS — Z510 Encounter for antineoplastic radiation therapy: Secondary | ICD-10-CM | POA: Diagnosis not present

## 2021-07-24 DIAGNOSIS — C7802 Secondary malignant neoplasm of left lung: Secondary | ICD-10-CM | POA: Diagnosis not present

## 2021-07-24 DIAGNOSIS — C480 Malignant neoplasm of retroperitoneum: Secondary | ICD-10-CM | POA: Diagnosis not present

## 2021-07-25 ENCOUNTER — Ambulatory Visit
Admission: RE | Admit: 2021-07-25 | Discharge: 2021-07-25 | Disposition: A | Discharge status: Home / Self Care | Payer: Medicare HMO | Source: Ambulatory Visit | Attending: Radiation Oncology | Admitting: Radiation Oncology

## 2021-07-25 DIAGNOSIS — C7802 Secondary malignant neoplasm of left lung: Secondary | ICD-10-CM | POA: Diagnosis not present

## 2021-07-25 DIAGNOSIS — C480 Malignant neoplasm of retroperitoneum: Secondary | ICD-10-CM | POA: Diagnosis not present

## 2021-07-25 DIAGNOSIS — Z510 Encounter for antineoplastic radiation therapy: Secondary | ICD-10-CM | POA: Diagnosis not present

## 2021-07-26 ENCOUNTER — Other Ambulatory Visit: Payer: Self-pay

## 2021-07-26 ENCOUNTER — Ambulatory Visit
Admission: RE | Admit: 2021-07-26 | Discharge: 2021-07-26 | Disposition: A | Discharge status: Home / Self Care | Payer: Medicare HMO | Source: Ambulatory Visit | Attending: Radiation Oncology | Admitting: Radiation Oncology

## 2021-07-26 DIAGNOSIS — C480 Malignant neoplasm of retroperitoneum: Secondary | ICD-10-CM | POA: Diagnosis not present

## 2021-07-26 DIAGNOSIS — Z510 Encounter for antineoplastic radiation therapy: Secondary | ICD-10-CM | POA: Diagnosis not present

## 2021-07-26 DIAGNOSIS — C7802 Secondary malignant neoplasm of left lung: Secondary | ICD-10-CM | POA: Diagnosis not present

## 2021-07-27 ENCOUNTER — Other Ambulatory Visit: Payer: Self-pay | Admitting: Cardiology

## 2021-07-29 ENCOUNTER — Ambulatory Visit
Admission: RE | Admit: 2021-07-29 | Discharge: 2021-07-29 | Disposition: A | Discharge status: Home / Self Care | Payer: Medicare HMO | Source: Ambulatory Visit | Attending: Radiation Oncology | Admitting: Radiation Oncology

## 2021-07-29 ENCOUNTER — Other Ambulatory Visit: Payer: Self-pay

## 2021-07-29 DIAGNOSIS — C7802 Secondary malignant neoplasm of left lung: Secondary | ICD-10-CM | POA: Diagnosis not present

## 2021-07-29 DIAGNOSIS — C480 Malignant neoplasm of retroperitoneum: Secondary | ICD-10-CM | POA: Diagnosis not present

## 2021-07-29 DIAGNOSIS — Z510 Encounter for antineoplastic radiation therapy: Secondary | ICD-10-CM | POA: Diagnosis not present

## 2021-07-30 ENCOUNTER — Ambulatory Visit
Admission: RE | Admit: 2021-07-30 | Discharge: 2021-07-30 | Disposition: A | Discharge status: Home / Self Care | Payer: Medicare HMO | Source: Ambulatory Visit | Attending: Radiation Oncology | Admitting: Radiation Oncology

## 2021-07-30 DIAGNOSIS — C7802 Secondary malignant neoplasm of left lung: Secondary | ICD-10-CM | POA: Diagnosis not present

## 2021-07-30 DIAGNOSIS — C480 Malignant neoplasm of retroperitoneum: Secondary | ICD-10-CM | POA: Diagnosis not present

## 2021-07-30 DIAGNOSIS — Z510 Encounter for antineoplastic radiation therapy: Secondary | ICD-10-CM | POA: Diagnosis not present

## 2021-07-31 ENCOUNTER — Other Ambulatory Visit: Payer: Self-pay

## 2021-07-31 ENCOUNTER — Ambulatory Visit
Admission: RE | Admit: 2021-07-31 | Discharge: 2021-07-31 | Disposition: A | Discharge status: Home / Self Care | Payer: Medicare HMO | Source: Ambulatory Visit | Attending: Radiation Oncology | Admitting: Radiation Oncology

## 2021-07-31 DIAGNOSIS — C7802 Secondary malignant neoplasm of left lung: Secondary | ICD-10-CM | POA: Diagnosis not present

## 2021-07-31 DIAGNOSIS — Z510 Encounter for antineoplastic radiation therapy: Secondary | ICD-10-CM | POA: Diagnosis not present

## 2021-07-31 DIAGNOSIS — C480 Malignant neoplasm of retroperitoneum: Secondary | ICD-10-CM | POA: Diagnosis not present

## 2021-08-01 ENCOUNTER — Inpatient Hospital Stay: Payer: Medicare HMO

## 2021-08-01 ENCOUNTER — Ambulatory Visit
Admission: RE | Admit: 2021-08-01 | Discharge: 2021-08-01 | Disposition: A | Discharge status: Home / Self Care | Payer: Medicare HMO | Source: Ambulatory Visit | Attending: Radiation Oncology | Admitting: Radiation Oncology

## 2021-08-01 ENCOUNTER — Inpatient Hospital Stay: Payer: Medicare HMO | Admitting: Oncology

## 2021-08-01 ENCOUNTER — Inpatient Hospital Stay: Payer: Medicare HMO | Attending: Oncology

## 2021-08-01 VITALS — BP 117/78 | HR 67 | Temp 98.4°F | Resp 16 | Wt 161.8 lb

## 2021-08-01 DIAGNOSIS — Z79899 Other long term (current) drug therapy: Secondary | ICD-10-CM | POA: Diagnosis not present

## 2021-08-01 DIAGNOSIS — C480 Malignant neoplasm of retroperitoneum: Secondary | ICD-10-CM | POA: Insufficient documentation

## 2021-08-01 DIAGNOSIS — Z7901 Long term (current) use of anticoagulants: Secondary | ICD-10-CM | POA: Insufficient documentation

## 2021-08-01 DIAGNOSIS — C7800 Secondary malignant neoplasm of unspecified lung: Secondary | ICD-10-CM | POA: Insufficient documentation

## 2021-08-01 DIAGNOSIS — Z95828 Presence of other vascular implants and grafts: Secondary | ICD-10-CM | POA: Diagnosis not present

## 2021-08-01 DIAGNOSIS — R109 Unspecified abdominal pain: Secondary | ICD-10-CM | POA: Diagnosis not present

## 2021-08-01 DIAGNOSIS — Z510 Encounter for antineoplastic radiation therapy: Secondary | ICD-10-CM | POA: Diagnosis not present

## 2021-08-01 DIAGNOSIS — Z5111 Encounter for antineoplastic chemotherapy: Secondary | ICD-10-CM | POA: Diagnosis not present

## 2021-08-01 DIAGNOSIS — Z886 Allergy status to analgesic agent status: Secondary | ICD-10-CM | POA: Diagnosis not present

## 2021-08-01 DIAGNOSIS — Z885 Allergy status to narcotic agent status: Secondary | ICD-10-CM | POA: Insufficient documentation

## 2021-08-01 DIAGNOSIS — C7802 Secondary malignant neoplasm of left lung: Secondary | ICD-10-CM | POA: Diagnosis not present

## 2021-08-01 DIAGNOSIS — Z923 Personal history of irradiation: Secondary | ICD-10-CM | POA: Diagnosis not present

## 2021-08-01 LAB — CBC WITH DIFFERENTIAL (CANCER CENTER ONLY)
Abs Immature Granulocytes: 0.03 10*3/uL (ref 0.00–0.07)
Basophils Absolute: 0 10*3/uL (ref 0.0–0.1)
Basophils Relative: 1 %
Eosinophils Absolute: 0.2 10*3/uL (ref 0.0–0.5)
Eosinophils Relative: 5 %
HCT: 34.3 % — ABNORMAL LOW (ref 39.0–52.0)
Hemoglobin: 11.7 g/dL — ABNORMAL LOW (ref 13.0–17.0)
Immature Granulocytes: 1 %
Lymphocytes Relative: 10 %
Lymphs Abs: 0.3 10*3/uL — ABNORMAL LOW (ref 0.7–4.0)
MCH: 33.3 pg (ref 26.0–34.0)
MCHC: 34.1 g/dL (ref 30.0–36.0)
MCV: 97.7 fL (ref 80.0–100.0)
Monocytes Absolute: 0.5 10*3/uL (ref 0.1–1.0)
Monocytes Relative: 16 %
Neutro Abs: 2.3 10*3/uL (ref 1.7–7.7)
Neutrophils Relative %: 67 %
Platelet Count: 160 10*3/uL (ref 150–400)
RBC: 3.51 MIL/uL — ABNORMAL LOW (ref 4.22–5.81)
RDW: 13.4 % (ref 11.5–15.5)
WBC Count: 3.3 10*3/uL — ABNORMAL LOW (ref 4.0–10.5)
nRBC: 0 % (ref 0.0–0.2)

## 2021-08-01 LAB — CMP (CANCER CENTER ONLY)
ALT: 18 U/L (ref 0–44)
AST: 25 U/L (ref 15–41)
Albumin: 3.6 g/dL (ref 3.5–5.0)
Alkaline Phosphatase: 210 U/L — ABNORMAL HIGH (ref 38–126)
Anion gap: 3 — ABNORMAL LOW (ref 5–15)
BUN: 20 mg/dL (ref 8–23)
CO2: 29 mmol/L (ref 22–32)
Calcium: 9 mg/dL (ref 8.9–10.3)
Chloride: 104 mmol/L (ref 98–111)
Creatinine: 1.28 mg/dL — ABNORMAL HIGH (ref 0.61–1.24)
GFR, Estimated: 58 mL/min — ABNORMAL LOW (ref >60)
Glucose, Bld: 93 mg/dL (ref 70–99)
Potassium: 4.5 mmol/L (ref 3.5–5.1)
Sodium: 136 mmol/L (ref 135–145)
Total Bilirubin: 0.4 mg/dL (ref 0.3–1.2)
Total Protein: 6.2 g/dL — ABNORMAL LOW (ref 6.5–8.1)

## 2021-08-01 MED ORDER — SODIUM CHLORIDE 0.9% FLUSH
10.0000 mL | INTRAVENOUS | Status: DC | PRN
  Administered 2021-08-01: 10 mL

## 2021-08-01 MED ORDER — SODIUM CHLORIDE 0.9% FLUSH
10.0000 mL | Freq: Once | INTRAVENOUS | Status: AC
  Administered 2021-08-01: 10 mL

## 2021-08-01 MED ORDER — DEXTROSE 5 % IV SOLN: Freq: Once | INTRAVENOUS | Status: AC

## 2021-08-01 MED ORDER — SODIUM CHLORIDE 0.9 % IV SOLN
10.0000 mg | Freq: Once | INTRAVENOUS | Status: AC
  Administered 2021-08-01: 10 mg via INTRAVENOUS
  Filled 2021-08-01: qty 10

## 2021-08-01 MED ORDER — DOXORUBICIN HCL LIPOSOMAL CHEMO INJECTION 2 MG/ML
20.0000 mg/m2 | Freq: Once | INTRAVENOUS | Status: AC
  Administered 2021-08-01: 40 mg via INTRAVENOUS
  Filled 2021-08-01: qty 20

## 2021-08-01 MED ORDER — HEPARIN SOD (PORK) LOCK FLUSH 100 UNIT/ML IV SOLN
500.0000 [IU] | Freq: Once | INTRAVENOUS | Status: AC | PRN
  Administered 2021-08-01: 500 [IU]

## 2021-08-01 NOTE — Progress Notes (Signed)
Hematology and Oncology Follow Up Visit ? ?William Avila ?[id number] ?[birth year] 76 y.o. ?08/01/2021 8:07 AM ?William Avila, MDGriffin, [name], MD  ? ?Principle Diagnosis: 76 year old with advanced liposarcoma documented with pulmonary involvement in September 2022.  He initially presented with retroperitoneal liposarcoma diagnosed in September 2021.   ? ? ?Prior Therapy: ?He underwent a radical resection by Dr. Barry Dienes on January 19, 2020.  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   ? ?He subsequently received adjuvant radiation therapy under the care of Dr. Lisbeth Renshaw completed on April 06, 2020.  He received a total dose of 48.6 Gray to the right abdomen.  ? ? ?Current therapy: Doxil 20 mg per metered square every 3 weeks started on February 07, 2021.  He is here for cycle 8 of therapy. ? ?Palliative radiation therapy to the left chest wall currently ongoing. ? ?Interim History: William Avila is here for a return evaluation.  Since the last visit, he reports of feeling better after the start of radiation.  He reports his flank pain continues to improve and decrease in his pain substantially.  He is no longer taking pain medication.  He denies any vomiting does report some mild nausea and uses Zofran ODT.  He denies any skin rashes or lesions.  He denies any excessive fatigue or tiredness. ? ? ? ? ?Medications: Reviewed without changes. ?Current Outpatient Medications  ?Medication Sig Dispense Refill  ? acetaminophen (TYLENOL) 500 MG tablet Take 500-1,000 mg by mouth every 6 (six) hours as needed for mild pain.    ? apixaban (ELIQUIS) 5 MG TABS tablet TAKE 1 TABLET BY MOUTH TWICE A DAY 60 tablet 5  ? HYDROcodone-acetaminophen (NORCO) 5-325 MG tablet Take 1 tablet by mouth every 6 (six) hours as needed for moderate pain. 30 tablet 0  ? lidocaine-prilocaine (EMLA) cream Apply 1 application topically as needed. 30 g 0  ? lisinopril (ZESTRIL) 10 MG  tablet Take 15 mg by mouth daily.    ? metoprolol succinate (TOPROL-XL) 25 MG 24 hr tablet TAKE 1 TABLET (25 MG TOTAL) BY MOUTH DAILY. 90 tablet 3  ? Multiple Vitamins-Minerals (MULTIVITAMIN WITH MINERALS) tablet Take 1 tablet by mouth in the morning.    ? ondansetron (ZOFRAN-ODT) 8 MG disintegrating tablet Take 8 mg by mouth every 8 (eight) hours as needed for nausea or vomiting.    ? Polyethyl Glycol-Propyl Glycol (SYSTANE) 0.4-0.3 % SOLN Place 1-2 drops into both eyes 3 (three) times daily as needed (dry/irritated eyes.).    ? predniSONE (DELTASONE) 50 MG tablet Pt to take 50 mg of prednisone on 01/17/21 at 8:40PM, 50 mg of prednisone on 01/18/21 at 02:40AM, and 50 mg of prednisone on 01/18/21 at 08:40AM. Pt is also to take 50 mg of benadryl on 01/18/21 at 8:40. Please call [phone] with any questions. 3 tablet 0  ? predniSONE (DELTASONE) 50 MG tablet Take 1 tablet by mouth at 4am on 05/08/21, take 1 tablet by mouth at 10am on 05/08/21, and take 1 tablet by mouth at 4pm on 05/08/21 3 tablet 0  ? rosuvastatin (CRESTOR) 10 MG tablet Take 1 tablet (10 mg total) by mouth daily. 90 tablet 3  ? triamcinolone cream (KENALOG) 0.1 % Apply 1 application topically daily as needed (itching/irritation/rash). 30 g 1  ? ?No current facility-administered medications for this visit.  ? ? ? ?Allergies:  ?Allergies  ?Allergen Reactions  ? Codeine Anaphylaxis  ? Ibuprofen Anaphylaxis  ? Robaxin [Methocarbamol] Anaphylaxis, Hives, Swelling and Rash  ?  Chlorhexidine Other (See Comments)  ?  Unknown reaction  ? Contrast Media [Iodinated Contrast Media] Itching and Other (See Comments)  ?  Itching and bumps on skin.  ? Fentanyl And Related Other (See Comments)  ?  Dry heaves (lasted for 14 hours)  ? Other Other (See Comments)  ?  Nerve Block Tray: causes nausea/dry heaves  ? Tramadol Hcl Other (See Comments)  ?  Changes personality, makes him angry  ? ? ? ?Physical Exam: ? ? ?Blood pressure 117/78, pulse 67, temperature 98.4 ?F (36.9 ?C),  temperature source Tympanic, resp. rate 16, weight 161 lb 12.8 oz (73.4 kg), SpO2 98 %. ? ? ?ECOG: 1 ? ? ? ?General appearance: Comfortable appearing without any discomfort ?Head: Normocephalic without any trauma ?Oropharynx: Mucous membranes are moist and pink without any thrush or ulcers. ?Eyes: Pupils are equal and round reactive to light. ?Lymph nodes: No cervical, supraclavicular, inguinal or axillary lymphadenopathy.   ?Heart:regular rate and rhythm.  S1 and S2 without leg edema. ?Lung: Clear without any rhonchi or wheezes.  No dullness to percussion. ?Abdomin: Soft, nontender, nondistended with good bowel sounds.  No hepatosplenomegaly. ?Musculoskeletal: No joint deformity or effusion.  Full range of motion noted. ?Neurological: No deficits noted on motor, sensory and deep tendon reflex exam. ?Skin: No petechial rash or dryness.  Appeared moist.  ? ? ? ? ? ? ? ?Lab Results: ?Lab Results  ?Component Value Date  ? WBC 4.1 07/11/2021  ? HGB 11.8 (L) 07/11/2021  ? HCT 34.7 (L) 07/11/2021  ? MCV 98.0 07/11/2021  ? PLT 157 07/11/2021  ? ?  Chemistry   ?   ?Component Value Date/Time  ? NA 138 07/11/2021 0833  ? NA 138 08/23/2020 1042  ? K 4.3 07/11/2021 0833  ? CL 105 07/11/2021 0833  ? CO2 27 07/11/2021 0833  ? BUN 22 07/11/2021 0833  ? BUN 16 08/23/2020 1042  ? CREATININE 1.28 (H) 07/11/2021 8882  ?    ?Component Value Date/Time  ? CALCIUM 9.2 07/11/2021 0833  ? ALKPHOS 253 (H) 07/11/2021 8003  ? AST 28 07/11/2021 0833  ? ALT 22 07/11/2021 0833  ? BILITOT 0.5 07/11/2021 4917  ?  ? ? ?  ? ? ? ?Impression and Plan: ? ? ? 76 year old with: ? ?1.  Advanced liposarcoma with pulmonary metastasis documented in 2022.   ?  ?He is currently on Doxil therapy.  Risks and benefits of continuing this treatment were discussed at this time.  Complications that include nausea, vomiting, myelosuppression among others were reiterated.  Different salvage therapy option including Votrient will be deferred unless he has clear  progression.  Clinical trial options are also encouraged at this time and he is looking into possibility of going to [facility].  The plan is to repeat his imaging studies obtained on July 09, 2021 showed mild progression but overall stable disease.  Plan is to update his staging scans in June 2023. ? ?  ?  ?  ?2.  IV access: Port-A-Cath continues to be in use without any issues. ?  ?3.  Antiemetics: Zofran is available to him without any nausea or vomiting. ?  ?4.  Cardiomyopathy evaluation: No signs or symptoms of cardiac decompensation. ?  ?5.  Goals of care: Therapy remains palliative although his disease is incurable. ? ?6.  Flank pain: He is currently receiving palliative radiation therapy. ? ? ?7.  Follow-up: In 3 weeks for repeat follow-up. ? ? ?30  minutes were dedicated to this visit.  The time was spent on reviewing laboratory data, disease status update and outlining future plan of care discussion. ? ? ?[name], MD ?4/13/20238:07 AM ? ?

## 2021-08-01 NOTE — Patient Instructions (Signed)
[facility]  Discharge Instructions: ?Thank you for choosing [facility] to provide your oncology and hematology care.  ? ?If you have a lab appointment with the [facility], please go directly to the [facility] and check in at the registration area. ?  ?Wear comfortable clothing and clothing appropriate for easy access to any Portacath or PICC line.  ? ?We strive to give you quality time with your provider. You may need to reschedule your appointment if you arrive late (15 or more minutes).  Arriving late affects you and other patients whose appointments are after yours.  Also, if you miss three or more appointments without notifying the office, you may be dismissed from the clinic at the provider?s discretion.    ?  ?For prescription refill requests, have your pharmacy contact our office and allow 72 hours for refills to be completed.   ? ?Today you received the following chemotherapy and/or immunotherapy agents: Doxorubicin.     ?  ?To help prevent nausea and vomiting after your treatment, we encourage you to take your nausea medication as directed. ? ?BELOW ARE SYMPTOMS THAT SHOULD BE REPORTED IMMEDIATELY: ?*FEVER GREATER THAN 100.4 F (38 ?C) OR HIGHER ?*CHILLS OR SWEATING ?*NAUSEA AND VOMITING THAT IS NOT CONTROLLED WITH YOUR NAUSEA MEDICATION ?*UNUSUAL SHORTNESS OF BREATH ?*UNUSUAL BRUISING OR BLEEDING ?*URINARY PROBLEMS (pain or burning when urinating, or frequent urination) ?*BOWEL PROBLEMS (unusual diarrhea, constipation, pain near the anus) ?TENDERNESS IN MOUTH AND THROAT WITH OR WITHOUT PRESENCE OF ULCERS (sore throat, sores in mouth, or a toothache) ?UNUSUAL RASH, SWELLING OR PAIN  ?UNUSUAL VAGINAL DISCHARGE OR ITCHING  ? ?Items with * indicate a potential emergency and should be followed up as soon as possible or go to the Emergency Department if any problems should occur. ? ?Please show the CHEMOTHERAPY ALERT CARD or IMMUNOTHERAPY ALERT CARD at check-in  to the Emergency Department and triage nurse. ? ?Should you have questions after your visit or need to cancel or reschedule your appointment, please contact [facility]  Dept: [phone]  and follow the prompts.  Office hours are 8:00 a.m. to 4:30 p.m. Monday - Friday. Please note that voicemails left after 4:00 p.m. may not be returned until the following business day.  We are closed weekends and major holidays. You have access to a nurse at all times for urgent questions. Please call the main number to the clinic Dept: [phone] and follow the prompts. ? ? ?For any non-urgent questions, you may also contact your provider using MyChart. We now offer e-Visits for anyone 71 and older to request care online for non-urgent symptoms. For details visit [email or web address]. ?  ?Also download the MyChart app! Go to the app store, search "MyChart", open the app, select [facility], and log in with your MyChart username and password. ? ?Due to Covid, a mask is required upon entering the hospital/clinic. If you do not have a mask, one will be given to you upon arrival. For doctor visits, patients may have 1 support person aged 17 or older with them. For treatment visits, patients cannot have anyone with them due to current Covid guidelines and our immunocompromised population.  ? ?

## 2021-08-02 ENCOUNTER — Other Ambulatory Visit: Payer: Self-pay

## 2021-08-02 ENCOUNTER — Ambulatory Visit
Admission: RE | Admit: 2021-08-02 | Discharge: 2021-08-02 | Disposition: A | Discharge status: Home / Self Care | Payer: Medicare HMO | Source: Ambulatory Visit | Attending: Radiation Oncology | Admitting: Radiation Oncology

## 2021-08-02 DIAGNOSIS — Z510 Encounter for antineoplastic radiation therapy: Secondary | ICD-10-CM | POA: Diagnosis not present

## 2021-08-02 DIAGNOSIS — C7802 Secondary malignant neoplasm of left lung: Secondary | ICD-10-CM | POA: Diagnosis not present

## 2021-08-02 DIAGNOSIS — C480 Malignant neoplasm of retroperitoneum: Secondary | ICD-10-CM | POA: Diagnosis not present

## 2021-08-05 ENCOUNTER — Ambulatory Visit
Admission: RE | Admit: 2021-08-05 | Discharge: 2021-08-05 | Disposition: A | Discharge status: Home / Self Care | Payer: Medicare HMO | Source: Ambulatory Visit | Attending: Radiation Oncology | Admitting: Radiation Oncology

## 2021-08-05 ENCOUNTER — Other Ambulatory Visit: Payer: Self-pay

## 2021-08-05 DIAGNOSIS — Z510 Encounter for antineoplastic radiation therapy: Secondary | ICD-10-CM | POA: Diagnosis not present

## 2021-08-05 DIAGNOSIS — C7802 Secondary malignant neoplasm of left lung: Secondary | ICD-10-CM | POA: Diagnosis not present

## 2021-08-05 DIAGNOSIS — C480 Malignant neoplasm of retroperitoneum: Secondary | ICD-10-CM | POA: Diagnosis not present

## 2021-08-06 ENCOUNTER — Ambulatory Visit
Admission: RE | Admit: 2021-08-06 | Discharge: 2021-08-06 | Disposition: A | Discharge status: Home / Self Care | Payer: Medicare HMO | Source: Ambulatory Visit | Attending: Radiation Oncology | Admitting: Radiation Oncology

## 2021-08-06 ENCOUNTER — Other Ambulatory Visit: Payer: Self-pay

## 2021-08-06 DIAGNOSIS — C7802 Secondary malignant neoplasm of left lung: Secondary | ICD-10-CM | POA: Diagnosis not present

## 2021-08-06 DIAGNOSIS — C480 Malignant neoplasm of retroperitoneum: Secondary | ICD-10-CM | POA: Diagnosis not present

## 2021-08-06 DIAGNOSIS — Z510 Encounter for antineoplastic radiation therapy: Secondary | ICD-10-CM | POA: Diagnosis not present

## 2021-08-06 LAB — RAD ONC ARIA SESSION SUMMARY
Course Elapsed Days: 15
Plan Fractions Treated to Date: 12
Plan Prescribed Dose Per Fraction: 2.5 Gy
Plan Total Fractions Prescribed: 15
Plan Total Prescribed Dose: 37.5 Gy
Reference Point Dosage Given to Date: 30 Gy
Reference Point Session Dosage Given: 2.5 Gy
Session Number: 12

## 2021-08-07 ENCOUNTER — Other Ambulatory Visit: Payer: Self-pay

## 2021-08-07 ENCOUNTER — Ambulatory Visit
Admission: RE | Admit: 2021-08-07 | Discharge: 2021-08-07 | Disposition: A | Discharge status: Home / Self Care | Payer: Medicare HMO | Source: Ambulatory Visit | Attending: Radiation Oncology | Admitting: Radiation Oncology

## 2021-08-07 DIAGNOSIS — Z510 Encounter for antineoplastic radiation therapy: Secondary | ICD-10-CM | POA: Diagnosis not present

## 2021-08-07 DIAGNOSIS — C7802 Secondary malignant neoplasm of left lung: Secondary | ICD-10-CM | POA: Diagnosis not present

## 2021-08-07 DIAGNOSIS — C480 Malignant neoplasm of retroperitoneum: Secondary | ICD-10-CM | POA: Diagnosis not present

## 2021-08-07 LAB — RAD ONC ARIA SESSION SUMMARY
Course Elapsed Days: 16
Plan Fractions Treated to Date: 13
Plan Prescribed Dose Per Fraction: 2.5 Gy
Plan Total Fractions Prescribed: 15
Plan Total Prescribed Dose: 37.5 Gy
Reference Point Dosage Given to Date: 32.5 Gy
Reference Point Session Dosage Given: 2.5 Gy
Session Number: 13

## 2021-08-08 ENCOUNTER — Ambulatory Visit
Admission: RE | Admit: 2021-08-08 | Discharge: 2021-08-08 | Disposition: A | Discharge status: Home / Self Care | Payer: Medicare HMO | Source: Ambulatory Visit | Attending: Radiation Oncology | Admitting: Radiation Oncology

## 2021-08-08 ENCOUNTER — Other Ambulatory Visit: Payer: Self-pay

## 2021-08-08 DIAGNOSIS — C480 Malignant neoplasm of retroperitoneum: Secondary | ICD-10-CM | POA: Diagnosis not present

## 2021-08-08 DIAGNOSIS — K5900 Constipation, unspecified: Secondary | ICD-10-CM | POA: Diagnosis not present

## 2021-08-08 DIAGNOSIS — C7802 Secondary malignant neoplasm of left lung: Secondary | ICD-10-CM | POA: Diagnosis not present

## 2021-08-08 DIAGNOSIS — R198 Other specified symptoms and signs involving the digestive system and abdomen: Secondary | ICD-10-CM | POA: Diagnosis not present

## 2021-08-08 DIAGNOSIS — Z510 Encounter for antineoplastic radiation therapy: Secondary | ICD-10-CM | POA: Diagnosis not present

## 2021-08-08 DIAGNOSIS — K623 Rectal prolapse: Secondary | ICD-10-CM | POA: Diagnosis not present

## 2021-08-08 LAB — RAD ONC ARIA SESSION SUMMARY
Course Elapsed Days: 17
Plan Fractions Treated to Date: 14
Plan Prescribed Dose Per Fraction: 2.5 Gy
Plan Total Fractions Prescribed: 15
Plan Total Prescribed Dose: 37.5 Gy
Reference Point Dosage Given to Date: 35 Gy
Reference Point Session Dosage Given: 2.5 Gy
Session Number: 14

## 2021-08-09 ENCOUNTER — Other Ambulatory Visit: Payer: Self-pay

## 2021-08-09 ENCOUNTER — Ambulatory Visit
Admission: RE | Admit: 2021-08-09 | Discharge: 2021-08-09 | Disposition: A | Discharge status: Home / Self Care | Payer: Medicare HMO | Source: Ambulatory Visit | Attending: Radiation Oncology | Admitting: Radiation Oncology

## 2021-08-09 ENCOUNTER — Encounter: Payer: Self-pay | Admitting: Radiation Oncology

## 2021-08-09 DIAGNOSIS — C7802 Secondary malignant neoplasm of left lung: Secondary | ICD-10-CM | POA: Diagnosis not present

## 2021-08-09 DIAGNOSIS — Z510 Encounter for antineoplastic radiation therapy: Secondary | ICD-10-CM | POA: Diagnosis not present

## 2021-08-09 DIAGNOSIS — C480 Malignant neoplasm of retroperitoneum: Secondary | ICD-10-CM | POA: Diagnosis not present

## 2021-08-09 LAB — RAD ONC ARIA SESSION SUMMARY
Course Elapsed Days: 18
Plan Fractions Treated to Date: 15
Plan Prescribed Dose Per Fraction: 2.5 Gy
Plan Total Fractions Prescribed: 15
Plan Total Prescribed Dose: 37.5 Gy
Reference Point Dosage Given to Date: 37.5 Gy
Reference Point Session Dosage Given: 2.5 Gy
Session Number: 15

## 2021-08-12 ENCOUNTER — Other Ambulatory Visit: Payer: Self-pay | Admitting: Radiation Oncology

## 2021-08-12 ENCOUNTER — Telehealth: Payer: Self-pay | Admitting: *Deleted

## 2021-08-12 ENCOUNTER — Telehealth: Payer: Self-pay

## 2021-08-12 MED ORDER — SUCRALFATE 1 G PO TABS: 1.0000 g | ORAL_TABLET | Freq: Three times a day (TID) | ORAL | 2 refills | Status: AC

## 2021-08-12 NOTE — Telephone Encounter (Signed)
Spoke with the patient's wife to go over instructions for his carafate.  She verbalized understanding of directions for use.  She will give is a call with any questions. ? ?Gloriajean Dell. Leonie Green, BSN  ?

## 2021-08-12 NOTE — Telephone Encounter (Signed)
T/C from pt's wife stating pt is having chest pain (only when eating) difficulty swallowing and nausea.  Mylanta is not helping. ? ?She was advised to go to ED but declined stating she does not think it is his heart but is asking for a prescription to help with symptoms. ? ?Please advise ? ?

## 2021-08-21 ENCOUNTER — Telehealth: Payer: Self-pay | Admitting: Oncology

## 2021-08-21 NOTE — Telephone Encounter (Signed)
Called patient regarding upcoming May and June appointments, patient is notified. ?

## 2021-08-22 ENCOUNTER — Inpatient Hospital Stay: Payer: Medicare HMO

## 2021-08-22 ENCOUNTER — Inpatient Hospital Stay (HOSPITAL_BASED_OUTPATIENT_CLINIC_OR_DEPARTMENT_OTHER): Payer: Medicare HMO | Admitting: Oncology

## 2021-08-22 ENCOUNTER — Other Ambulatory Visit: Payer: Self-pay

## 2021-08-22 ENCOUNTER — Inpatient Hospital Stay: Payer: Medicare HMO | Attending: Oncology

## 2021-08-22 VITALS — BP 110/76 | HR 57 | Temp 97.7°F | Resp 17 | Ht 71.0 in | Wt 153.0 lb

## 2021-08-22 DIAGNOSIS — Z95828 Presence of other vascular implants and grafts: Secondary | ICD-10-CM | POA: Diagnosis not present

## 2021-08-22 DIAGNOSIS — Z5111 Encounter for antineoplastic chemotherapy: Secondary | ICD-10-CM | POA: Diagnosis not present

## 2021-08-22 DIAGNOSIS — Z886 Allergy status to analgesic agent status: Secondary | ICD-10-CM | POA: Diagnosis not present

## 2021-08-22 DIAGNOSIS — Z79899 Other long term (current) drug therapy: Secondary | ICD-10-CM | POA: Insufficient documentation

## 2021-08-22 DIAGNOSIS — Z7901 Long term (current) use of anticoagulants: Secondary | ICD-10-CM | POA: Diagnosis not present

## 2021-08-22 DIAGNOSIS — C480 Malignant neoplasm of retroperitoneum: Secondary | ICD-10-CM | POA: Insufficient documentation

## 2021-08-22 DIAGNOSIS — Z923 Personal history of irradiation: Secondary | ICD-10-CM | POA: Insufficient documentation

## 2021-08-22 DIAGNOSIS — K209 Esophagitis, unspecified without bleeding: Secondary | ICD-10-CM | POA: Diagnosis not present

## 2021-08-22 DIAGNOSIS — Z885 Allergy status to narcotic agent status: Secondary | ICD-10-CM | POA: Insufficient documentation

## 2021-08-22 DIAGNOSIS — R109 Unspecified abdominal pain: Secondary | ICD-10-CM | POA: Insufficient documentation

## 2021-08-22 LAB — CBC WITH DIFFERENTIAL (CANCER CENTER ONLY)
Abs Immature Granulocytes: 0.03 10*3/uL (ref 0.00–0.07)
Basophils Absolute: 0 10*3/uL (ref 0.0–0.1)
Basophils Relative: 1 %
Eosinophils Absolute: 0.2 10*3/uL (ref 0.0–0.5)
Eosinophils Relative: 6 %
HCT: 33 % — ABNORMAL LOW (ref 39.0–52.0)
Hemoglobin: 11.7 g/dL — ABNORMAL LOW (ref 13.0–17.0)
Immature Granulocytes: 1 %
Lymphocytes Relative: 5 %
Lymphs Abs: 0.1 10*3/uL — ABNORMAL LOW (ref 0.7–4.0)
MCH: 34.2 pg — ABNORMAL HIGH (ref 26.0–34.0)
MCHC: 35.5 g/dL (ref 30.0–36.0)
MCV: 96.5 fL (ref 80.0–100.0)
Monocytes Absolute: 0.7 10*3/uL (ref 0.1–1.0)
Monocytes Relative: 24 %
Neutro Abs: 1.8 10*3/uL (ref 1.7–7.7)
Neutrophils Relative %: 63 %
Platelet Count: 154 10*3/uL (ref 150–400)
RBC: 3.42 MIL/uL — ABNORMAL LOW (ref 4.22–5.81)
RDW: 13.2 % (ref 11.5–15.5)
WBC Count: 2.8 10*3/uL — ABNORMAL LOW (ref 4.0–10.5)
nRBC: 0 % (ref 0.0–0.2)

## 2021-08-22 LAB — CMP (CANCER CENTER ONLY)
ALT: 17 U/L (ref 0–44)
AST: 24 U/L (ref 15–41)
Albumin: 3.5 g/dL (ref 3.5–5.0)
Alkaline Phosphatase: 211 U/L — ABNORMAL HIGH (ref 38–126)
Anion gap: 5 (ref 5–15)
BUN: 17 mg/dL (ref 8–23)
CO2: 27 mmol/L (ref 22–32)
Calcium: 8.9 mg/dL (ref 8.9–10.3)
Chloride: 105 mmol/L (ref 98–111)
Creatinine: 1.21 mg/dL (ref 0.61–1.24)
GFR, Estimated: >60 mL/min (ref >60)
Glucose, Bld: 94 mg/dL (ref 70–99)
Potassium: 4 mmol/L (ref 3.5–5.1)
Sodium: 137 mmol/L (ref 135–145)
Total Bilirubin: 0.4 mg/dL (ref 0.3–1.2)
Total Protein: 5.9 g/dL — ABNORMAL LOW (ref 6.5–8.1)

## 2021-08-22 MED ORDER — DEXTROSE 5 % IV SOLN: Freq: Once | INTRAVENOUS | Status: AC

## 2021-08-22 MED ORDER — SODIUM CHLORIDE 0.9% FLUSH
10.0000 mL | Freq: Once | INTRAVENOUS | Status: AC
  Administered 2021-08-22: 10 mL

## 2021-08-22 MED ORDER — DOXORUBICIN HCL LIPOSOMAL CHEMO INJECTION 2 MG/ML
20.0000 mg/m2 | Freq: Once | INTRAVENOUS | Status: AC
  Administered 2021-08-22: 40 mg via INTRAVENOUS
  Filled 2021-08-22: qty 20

## 2021-08-22 MED ORDER — SODIUM CHLORIDE 0.9 % IV SOLN
10.0000 mg | Freq: Once | INTRAVENOUS | Status: AC
  Administered 2021-08-22: 10 mg via INTRAVENOUS
  Filled 2021-08-22: qty 10

## 2021-08-22 NOTE — Progress Notes (Signed)
Hematology and Oncology Follow Up Visit ? ?William Avila ?[id number] ?April 29, 1945 76 y.o. ?08/22/2021 8:04 AM ?William Avila, MDGriffin, William Reichmann, MD  ? 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  He developed advanced disease with pulmonary involvement in September 2022.  ? ? ?Prior Therapy: ?He underwent a radical resection by Dr. Barry Avila on January 19, 2020.  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   ? ?He subsequently received adjuvant radiation therapy under the care of Dr. Lisbeth Avila completed on April 06, 2020.  He received a total dose of 48.6 Gray to the right abdomen.  ? ?Palliative radiation therapy to the left chest completed in April 2023. ? ? ?Current therapy: Doxil 20 mg/m? very 3 weeks started on February 07, 2021.  He is here for cycle 9 of therapy. ? ? ? ?Interim History: William Avila is here for a follow-up visit.  Since last visit, he completed radiation therapy to the chest wall with few complaints.  He developed symptoms of esophagitis and required William Avila.  He is able to swallow better at this time although he has lost close to 4 pounds.  He denies any nausea, vomiting or abdominal pain.  His performance status and quality of life remains unchanged.  He denies any dyspnea exertion, shortness of breath or cough.  He denies any wheezing or hemoptysis. ? ? ? ? ?Medications: Updated on review. ?Current Outpatient Medications  ?Medication Sig Dispense Refill  ? acetaminophen (TYLENOL) 500 MG tablet Take 500-1,000 mg by mouth every 6 (six) hours as needed for mild pain.    ? apixaban (ELIQUIS) 5 MG TABS tablet TAKE 1 TABLET BY MOUTH TWICE A DAY 60 tablet 5  ? HYDROcodone-acetaminophen (NORCO) 5-325 MG tablet Take 1 tablet by mouth every 6 (six) hours as needed for moderate pain. 30 tablet 0  ? lidocaine-prilocaine (EMLA) cream Apply 1 application topically as needed. 30 g 0  ?  lisinopril (ZESTRIL) 10 MG tablet Take 15 mg by mouth daily.    ? metoprolol succinate (TOPROL-XL) 25 MG 24 hr tablet TAKE 1 TABLET (25 MG TOTAL) BY MOUTH DAILY. 90 tablet 3  ? Multiple Vitamins-Minerals (MULTIVITAMIN WITH MINERALS) tablet Take 1 tablet by mouth in the morning.    ? ondansetron (ZOFRAN-ODT) 8 MG disintegrating tablet Take 8 mg by mouth every 8 (eight) hours as needed for nausea or vomiting.    ? Polyethyl Glycol-Propyl Glycol (SYSTANE) 0.4-0.3 % SOLN Place 1-2 drops into both eyes 3 (three) times daily as needed (dry/irritated eyes.).    ? predniSONE (DELTASONE) 50 MG tablet Pt to take 50 mg of prednisone on 01/17/21 at 8:40PM, 50 mg of prednisone on 01/18/21 at 02:40AM, and 50 mg of prednisone on 01/18/21 at 08:40AM. Pt is also to take 50 mg of benadryl on 01/18/21 at 8:40. Please call [phone] with any questions. 3 tablet 0  ? predniSONE (DELTASONE) 50 MG tablet Take 1 tablet by mouth at 4am on 05/08/21, take 1 tablet by mouth at 10am on 05/08/21, and take 1 tablet by mouth at 4pm on 05/08/21 3 tablet 0  ? rosuvastatin (CRESTOR) 10 MG tablet Take 1 tablet (10 mg total) by mouth daily. 90 tablet 3  ? sucralfate (William Avila) 1 g tablet Take 1 tablet (1 g total) by mouth 4 (four) times daily -  with meals and at bedtime. Crush 1 tablet in 1 oz water and drink 5 min before meals for radiation induced esophagitis 120 tablet 2  ?  triamcinolone cream (KENALOG) 0.1 % Apply 1 application topically daily as needed (itching/irritation/rash). 30 g 1  ? ?No current facility-administered medications for this visit.  ? ? ? ?Allergies:  ?Allergies  ?Allergen Reactions  ? Codeine Anaphylaxis  ? Ibuprofen Anaphylaxis  ? Robaxin [Methocarbamol] Anaphylaxis, Hives, Swelling and Rash  ? Chlorhexidine Other (See Comments)  ?  Unknown reaction  ? Contrast Media [Iodinated Contrast Media] Itching and Other (See Comments)  ?  Itching and bumps on skin.  ? Fentanyl And Related Other (See Comments)  ?  Dry heaves (lasted for 14  hours)  ? Other Other (See Comments)  ?  Nerve Block Tray: causes nausea/dry heaves  ? Tramadol Hcl Other (See Comments)  ?  Changes personality, makes him angry  ? ? ? ?Physical Exam: ? ? ?Blood pressure 110/76, pulse (!) 57, temperature 97.7 ?F (36.5 ?C), temperature source Temporal, resp. rate 17, height 5\' 11"  (1.803 m), weight 153 lb (69.4 kg), SpO2 97 %. ? ? ? ?ECOG: 1 ? ? ?General appearance: Alert, awake without any distress. ?Head: Atraumatic without abnormalities ?Oropharynx: Without any thrush or ulcers. ?Eyes: No scleral icterus. ?Lymph nodes: No lymphadenopathy noted in the cervical, supraclavicular, or axillary nodes ?Heart:regular rate and rhythm, without any murmurs or gallops.   ?Lung: Clear to auscultation without any rhonchi, wheezes or dullness to percussion. ?Abdomin: Soft, nontender without any shifting dullness or ascites. ?Musculoskeletal: No clubbing or cyanosis. ?Neurological: No motor or sensory deficits. ?Skin: No rashes or lesions. ?  ? ? ? ? ? ? ? ?Lab Results: ?Lab Results  ?Component Value Date  ? WBC 3.3 (L) 08/01/2021  ? HGB 11.7 (L) 08/01/2021  ? HCT 34.3 (L) 08/01/2021  ? MCV 97.7 08/01/2021  ? PLT 160 08/01/2021  ? ?  Chemistry   ?   ?Component Value Date/Time  ? NA 136 08/01/2021 0758  ? NA 138 08/23/2020 1042  ? K 4.5 08/01/2021 0758  ? CL 104 08/01/2021 0758  ? CO2 29 08/01/2021 0758  ? BUN 20 08/01/2021 0758  ? BUN 16 08/23/2020 1042  ? CREATININE 1.28 (H) 08/01/2021 0758  ?    ?Component Value Date/Time  ? CALCIUM 9.0 08/01/2021 0758  ? ALKPHOS 210 (H) 08/01/2021 0758  ? AST 25 08/01/2021 0758  ? ALT 18 08/01/2021 0758  ? BILITOT 0.4 08/01/2021 0758  ?  ? ? ?  ? ? ? ?Impression and Plan: ? ? ? 76 year old with: ? ?1.  Poorly differentiated liposarcoma of the retroperitoneum diagnosed 2021.  He developed advanced disease with pulmonary involvement. ?  ?Risks and benefits of proceeding with the next cycle of Doxil were reviewed at this time.  Complication associated with this  therapy include nausea, vomiting and hand-foot syndrome.  The plan is to update his staging scans after the next visit.  Different salvage therapy options including gemcitabine based regimen, Votrient among others.  He is also under consideration for clinical trial at [facility] which I urged him to consider it.  For the time being he is agreeable to proceed with Doxil today. ? ?  ?  ?  ?2.  IV access: Port-A-Cath remains in place without any issues. ?  ?3.  Antiemetics: No nausea or vomiting reported at this time.  Zofran is available to him. ?  ?4.  Cardiomyopathy evaluation: Baseline echocardiogram was normal.  No evidence of heart failure noted at this time. ?  ?5.  Goals of care: His disease is incurable with disease  that is difficult to treat and usually poorly responsive to systemic treatments.  Aggressive measures are warranted given his reasonable performance status. ? ?6.  Flank pain: He is status post radiation therapy with esophagitis. ? ? ?7.  Follow-up: He will return in 3 weeks for follow-up evaluation. ? ? ?30  minutes were spent on this encounter.  The time was dedicated to reviewing laboratory data, disease status update and future plan of care discussion. ? ? ?[name], MD ?5/4/20238:04 AM ? ?

## 2021-08-27 ENCOUNTER — Encounter: Payer: Self-pay | Admitting: Oncology

## 2021-08-27 NOTE — Progress Notes (Signed)
? ?                                                                                                                                                          ?  Patient Name: William Avila ?MRN: [id number] ?DOB: 1945/11/08 ?Referring Physician: [name] (Profile Not Attached) ?Date of Service: 08/09/2021 ?[facility], [place] ? ?                                                      End Of Treatment Note ? ?Diagnoses: C48.0-Malignant neoplasm of retroperitoneum ?C78.02-Secondary malignant neoplasm of left lung ? ?Cancer Staging:  Progressive metastatic High-grade liposarcoma of the retroperitoneum with positive margins at the time of resection, with progressive pulmonary disease ? ?Intent: Palliative ? ?Radiation Treatment Dates: 07/22/2021 through 08/09/2021 ?Site Technique Total Dose (Gy) Dose per Fx (Gy) Completed Fx Beam Energies  ?Lung, Left: Lung_L 3D 37.5/37.5 2.5 15/15 6X  ? ?Narrative: The patient tolerated radiation therapy relatively well. He developed fatigue and esophagitis during radiotherapy. Symptoms were treated with carafate. ? ?Plan: The patient will receive a call in about one month from the radiation oncology department. She will continue follow up with Dr. Alen Blew as well. ?________________________________________________ ? ? ? ?Carola Rhine, PAC  ?

## 2021-08-28 DIAGNOSIS — Z91041 Radiographic dye allergy status: Secondary | ICD-10-CM | POA: Diagnosis not present

## 2021-08-28 DIAGNOSIS — C7801 Secondary malignant neoplasm of right lung: Secondary | ICD-10-CM | POA: Diagnosis not present

## 2021-08-28 DIAGNOSIS — C7802 Secondary malignant neoplasm of left lung: Secondary | ICD-10-CM | POA: Diagnosis not present

## 2021-08-28 DIAGNOSIS — C499 Malignant neoplasm of connective and soft tissue, unspecified: Secondary | ICD-10-CM | POA: Diagnosis not present

## 2021-08-28 DIAGNOSIS — C7989 Secondary malignant neoplasm of other specified sites: Secondary | ICD-10-CM | POA: Diagnosis not present

## 2021-09-04 DIAGNOSIS — Z91041 Radiographic dye allergy status: Secondary | ICD-10-CM | POA: Diagnosis not present

## 2021-09-04 DIAGNOSIS — C499 Malignant neoplasm of connective and soft tissue, unspecified: Secondary | ICD-10-CM | POA: Diagnosis not present

## 2021-09-04 DIAGNOSIS — C7801 Secondary malignant neoplasm of right lung: Secondary | ICD-10-CM | POA: Diagnosis not present

## 2021-09-04 DIAGNOSIS — C7802 Secondary malignant neoplasm of left lung: Secondary | ICD-10-CM | POA: Diagnosis not present

## 2021-09-04 DIAGNOSIS — C7989 Secondary malignant neoplasm of other specified sites: Secondary | ICD-10-CM | POA: Diagnosis not present

## 2021-09-04 DIAGNOSIS — C494 Malignant neoplasm of connective and soft tissue of abdomen: Secondary | ICD-10-CM | POA: Diagnosis not present

## 2021-09-04 DIAGNOSIS — Z79899 Other long term (current) drug therapy: Secondary | ICD-10-CM | POA: Diagnosis not present

## 2021-09-12 ENCOUNTER — Ambulatory Visit: Payer: Medicare HMO

## 2021-09-12 ENCOUNTER — Other Ambulatory Visit: Payer: Medicare HMO

## 2021-09-12 ENCOUNTER — Ambulatory Visit: Payer: Medicare HMO | Admitting: Oncology

## 2021-09-16 NOTE — Progress Notes (Unsigned)
Cardiology Office Note:    Date:  09/17/2021   ID:  William Avila, DOB Jan 04, 1946, MRN [id number]  PCP:  [name], MD  Cardiologist:  None  Electrophysiologist:  [name], MD   Referring MD: [name], MD   Chief Complaint  Patient presents with   Atrial Fibrillation     History of Present Illness:    William Avila is a 76 y.o. male with a hx of retroperitoneal liposarcoma, SVT, persistent atrial fibrillation, tachybrady syndrome s/p PPM who presents for follow-up.  He was seen in the ED on 05/30/2019 after having a surgical procedure on his arm and having runs of tachycardia following this.  Telemetry in the ED showed frequent episodes of narrow complex tachycardia, with rates up to 150s.  Appeared to be ectopic P wave during episodes suggesting atrial tachycardia as the cause.  He was discharged on metoprolol but did not take.   TTE on 07/06/2019 showed EF 65 to 70%, moderate LVH, grade 1 diastolic dysfunction, normal RV function, moderate dilatation of the ascending aorta measuring 44 mm.  CMR with MRA was recommended to work-up LVH and aortic dilatation.    CMR on 09/14/2019 showed mild asymmetric hypertrophy measuring 13 mm and basal septum not meeting criteria for HCM, no LGE, LV EF 61%, RVEF 63%, dilated ascending aorta measuring 41 mm.  Zio patch x14 days showed 485 episodes of SVT, longest lasting nearly 5 minutes, occasional PVCs (2% of beats).  Reported exertional chest pain, Lexiscan Myoview was done on 11/16/2019 which showed no ischemia, attenuation artifact, LVEF 56%.  Continue to have chest pain underwent coronary CTA on 05/10/2020, which showed calcium score 200 (47 percentile), respiratory motion artifact but no clear obstructive disease.  Zio patch on 05/16/2020 showed 577 episodes of SVT, longest lasting 14 minutes.  Given symptomatic tachybrady syndrome, was referred to EP and underwent PPM placement on 06/20/2020.  Subsequently presented to ED with chest pain  on 06/26/2020 found to have lead perforation with large pericardial effusion.  He underwent lead revision on 06/26/2020.  Repeat echo on 07/06/2020 showed normal biventricular function, trivial to small effusion.  Device interrogation showed atrial fibrillation.  He started Eliquis on 3/30 and repeat echo on 4/6 no pericardial effusion.  Underwent successful cardioversion for atrial fibrillation on 08/29/2020.  Echocardiogram 03/21/2021 showed normal biventricular function, moderate left atrial enlargement, no significant valvular disease, ascending aortic dilatation measuring 43 mm.  Since last clinic visit, he reports that he is doing well.  He is being treated at [facility] for his liposarcoma.  Last chemotherapy was 5/4.  He reports he has been feeling fatigued.  Walks 1 to 2 miles per day, denies any exertional chest pain.  Does report some shortness of breath.  Denies any lower extremity edema or palpitations.  Reports some lightheadedness with bending over.  Denies any syncope.  Denies any bleeding issues on Eliquis.  Wt Readings from Last 3 Encounters:  09/17/21 153 lb 9.6 oz (69.7 kg)  08/22/21 153 lb (69.4 kg)  08/01/21 161 lb 12.8 oz (73.4 kg)     Past Medical History:  Diagnosis Date   Acute appendicitis 01/21/2014   Arthritis    Dysrhythmia    PSVT, bradycardia   History of hiatal hernia    Hypertension    Presence of permanent cardiac pacemaker     Past Surgical History:  Procedure Laterality Date   BACK SURGERY     and neck and trigger finger surgery   CARDIOVERSION N/A  08/29/2020   Procedure: CARDIOVERSION;  Surgeon: Sanda Klein, MD;  Location: [facility];  Service: Cardiovascular;  Laterality: N/A;   CERVICAL SPINE SURGERY     ESOPHAGOGASTRODUODENOSCOPY (EGD) WITH PROPOFOL Left 12/24/2015   Procedure: ESOPHAGOGASTRODUODENOSCOPY (EGD) WITH PROPOFOL;  Surgeon: Otis Brace, MD;  Location: [place];  Service: Gastroenterology;  Laterality: Left;   HIATAL HERNIA REPAIR N/A  12/26/2015   Procedure: LAPAROSCOPIC REPAIR OF HIATAL HERNIA WITH MESH;  Surgeon: Michael Boston, MD;  Location: [place];  Service: General;  Laterality: N/A;   INGUINAL HERNIA REPAIR Left 01/19/2020   Procedure: LEFT INGUINAL HERNIA REPAIR WITH MESH;  Surgeon: Stark Klein, MD;  Location: [place];  Service: General;  Laterality: Left;   INSERTION OF MESH Left 01/19/2020   Procedure: INSERTION OF MESH;  Surgeon: Stark Klein, MD;  Location: [place];  Service: General;  Laterality: Left;   IR IMAGING GUIDED PORT INSERTION  02/05/2021   LAPAROSCOPIC APPENDECTOMY N/A 01/21/2014   Procedure: APPENDECTOMY LAPAROSCOPIC;  Surgeon: Autumn Messing III, MD;  Location: [place];  Service: General;  Laterality: N/A;   LEAD REVISION/REPAIR N/A 06/26/2020   Procedure: LEAD REVISION/REPAIR;  Surgeon: [name], MD;  Location: [place] [facility];  Service: Cardiovascular;  Laterality: N/A;   PACEMAKER IMPLANT N/A 06/20/2020   Procedure: PACEMAKER IMPLANT;  Surgeon: Evans Lance, MD;  Location: [place] [facility];  Service: Cardiovascular;  Laterality: N/A;   RESECTION OF RETROPERITONEAL MASS N/A 01/19/2020   Procedure: 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;  Surgeon: Stark Klein, MD;  Location: [place];  Service: General;  Laterality: N/A;   TRIGGER FINGER RELEASE Left 05/2019    Current Medications: Current Meds  Medication Sig   acetaminophen (TYLENOL) 500 MG tablet Take 500-1,000 mg by mouth every 6 (six) hours as needed for mild pain.   apixaban (ELIQUIS) 5 MG TABS tablet TAKE 1 TABLET BY MOUTH TWICE A DAY   lisinopril (ZESTRIL) 10 MG tablet Take 15 mg by mouth daily.   metoprolol succinate (TOPROL-XL) 25 MG 24 hr tablet TAKE 1 TABLET (25 MG TOTAL) BY MOUTH DAILY.   Multiple Vitamins-Minerals (MULTIVITAMIN WITH MINERALS) tablet Take 1 tablet by mouth in the morning.   ondansetron (ZOFRAN-ODT) 8 MG disintegrating tablet Take 8 mg by mouth every 8 (eight) hours as needed for nausea or vomiting.    Polyethyl Glycol-Propyl Glycol (SYSTANE) 0.4-0.3 % SOLN Place 1-2 drops into both eyes 3 (three) times daily as needed (dry/irritated eyes.).   rosuvastatin (CRESTOR) 10 MG tablet Take 1 tablet (10 mg total) by mouth daily.   triamcinolone cream (KENALOG) 0.1 % Apply 1 application topically daily as needed (itching/irritation/rash).     Allergies:   Codeine, Ibuprofen, Robaxin [methocarbamol], Chlorhexidine, Contrast media [iodinated contrast media], Fentanyl and related, Other, and Tramadol hcl   Social History   Socioeconomic History   Marital status: Married    Spouse name: Not on file   Number of children: Not on file   Years of education: Not on file   Highest education level: Not on file  Occupational History   Not on file  Tobacco Use   Smoking status: Never   Smokeless tobacco: Never  Vaping Use   Vaping Use: Never used  Substance and Sexual Activity   Alcohol use: Yes    Comment: Occassionally   Drug use: No   Sexual activity: Not on file  Other Topics Concern   Not on file  Social History Narrative   Not on file  Social Determinants of Health   Financial Resource Strain: Not on file  Food Insecurity: Not on file  Transportation Needs: Not on file  Physical Activity: Not on file  Stress: Not on file  Social Connections: Not on file     Family History: No relevant family history   ROS:   Please see the history of present illness.     All other systems reviewed and are negative.  EKGs/Labs/Other Studies Reviewed:    The following studies were reviewed today:   EKG:   06/23/19: Sinus rhythm, rate 52, no ST/T abnormalities 10/26/19: EKG was not ordered. 01/09/20: Sinus rhythm, rate 53, no ST/T abnormalities 04/25/20: Sinus rhythm, rate 44, no ST/T abnormalities 05/21/20: sinus rhythm, rate 50, no ST/T abnormalities 08/23/20: V paced, A. fib, rate 70 03/20/21: A-paced, rate 57 09/17/2021: Atrial paced with PACs, PVC, rate 74  Recent Labs: 08/22/2021: ALT 17;  BUN 17; Creatinine 1.21; Hemoglobin 11.7; Platelet Count 154; Potassium 4.0; Sodium 137  Recent Lipid Panel    Component Value Date/Time   CHOL 161 05/29/2020 0945   TRIG 56 05/29/2020 0945   HDL 74 05/29/2020 0945   CHOLHDL 2.2 05/29/2020 0945   VLDL 11 05/29/2020 0945   LDLCALC 76 05/29/2020 0945     Lexiscan Myoview 06/09/18: Nuclear stress EF: 62%. No T wave inversion was noted during stress. There was no ST segment deviation noted during stress. This is a low risk study.   No significant reversible ischemia. LVEF 62% with normal wall motion. This is a low risk study.  Physical Exam:    VS:  BP 115/72   Pulse 74   Ht 5\' 11"  (1.803 m)   Wt 153 lb 9.6 oz (69.7 kg)   SpO2 95%   BMI 21.42 kg/m     Wt Readings from Last 3 Encounters:  09/17/21 153 lb 9.6 oz (69.7 kg)  08/22/21 153 lb (69.4 kg)  08/01/21 161 lb 12.8 oz (73.4 kg)     GEN:  Well nourished, well developed in no acute distress HEENT: Normal NECK: No JVD CARDIAC: RRR, 2/6 systolic heart murmur RESPIRATORY:  Clear to auscultation without rales, wheezing or rhonchi  ABDOMEN: Soft, non-tender, non-distended MUSCULOSKELETAL:  No edema; No deformity  SKIN: Warm and dry NEUROLOGIC:  Alert and oriented x 3 PSYCHIATRIC:  Normal affect   ASSESSMENT:    1. Persistent atrial fibrillation (HCC)   2. Chest pain of uncertain etiology   3. Tachycardia-bradycardia syndrome ([place])   4. Aortic dilatation (HCC)   5. Essential hypertension      PLAN:    Persistent atrial fibrillation: CHA2DS2-VASc 2 (hypertension, age).  Underwent successful cardioversion for atrial fibrillation on 08/29/2020. -Continue Eliquis 5 mg twice daily -Continue Toprol-XL 25 mg daily   Liposarcoma: Of retroperitoneum, diagnosed in 2021.  Underwent resection 12/2019.  Found to have metastatic disease to lung in 12/2020.  Currently on chemotherapy.  Echocardiogram 03/21/2021 showed normal biventricular function, moderate left atrial enlargement,  no significant valvular disease, ascending aortic dilatation measuring 43 mm.  Chest pain:  Reported chest pain, Lexiscan Myoview was done on 11/16/2019 which showed no ischemia, attenuation artifact, LVEF 56%.  Continue to have chest pain so underwent coronary CTA on 05/10/2020, which showed calcium score 200 (47 percentile), respiratory motion artifact but no clear obstructive disease, and with recent normal Lexiscan, no further work-up recommended at this time.   -Continue rosuvastatin 10 mg daily.  LDL 65 on 06/19/2021  Tachybrady syndrome: Frequent episodes SVT on Zio in  March 2021.  Unable to tolerate low dose metoprolol as resting heart rate in 40s to 50s.  Reported lightheadedness with SVT episodes.  Referred to EP, seen by Dr. Lovena Le.  Zio patch x 7 days on 05/16/2020 showed 577 episodes of SVT, longest lasting 14 minutes.  Given symptomatic tachybrady syndrome, underwent PPM placement on 06/20/2020.  Subsequently presented to ED with chest pain on 06/26/2020 found to have lead perforation with large pericardial effusion.  He underwent lead revision on 06/26/2020.  Repeat echo on 07/06/2020 showed normal biventricular function, trivial to small effusion.  Device interrogation showed atrial fibrillation.  He started Eliquis on 07/18/20 and repeat echo on 07/25/20 no pericardial effusion. -Continue Toprol-XL 25 mg daily -Continue EP follow-up  LVH: CMR on 09/14/2019 showed mild asymmetric hypertrophy measuring 13 mm and basal septum not meeting criteria for HCM, no LGE   Aortic dilatation: dilated ascending aorta measuring 41 mm on MRA 08/2019.  Measured 43 mm on echo 03/2021 as above.  Will monitor  Hypertension: On lisinopril 15 mg daily.  Appears controlled   RTC in 6 months  Medication Adjustments/Labs and Tests Ordered: Current medicines are reviewed at length with the patient today.  Concerns regarding medicines are outlined above.  Orders Placed This Encounter  Procedures   EKG 12-Lead    No  orders of the defined types were placed in this encounter.    Patient Instructions  Medication Instructions:  Your physician recommends that you continue on your current medications as directed. Please refer to the Current Medication list given to you today.  *If you need a refill on your cardiac medications before your next appointment, please call your pharmacy*  Follow-Up: At [facility], you and your health needs are our priority.  As part of our continuing mission to provide you with exceptional heart care, we have created designated Provider Care Teams.  These Care Teams include your primary Cardiologist (physician) and Advanced Practice Providers (APPs -  Physician Assistants and Nurse Practitioners) who all work together to provide you with the care you need, when you need it.  We recommend signing up for the patient portal called "MyChart".  Sign up information is provided on this After Visit Summary.  MyChart is used to connect with patients for Virtual Visits (Telemedicine).  Patients are able to view lab/test results, encounter notes, upcoming appointments, etc.  Non-urgent messages can be sent to your provider as well.   To learn more about what you can do with MyChart, go to [email or web address].    Your next appointment:   6 month(s)  The format for your next appointment:   In Person  Provider:   Dr. Gardiner Rhyme  Important Information About Sugar           Signed, Donato Heinz, MD  09/17/2021 9:21 AM    [place]

## 2021-09-17 ENCOUNTER — Encounter: Payer: Self-pay | Admitting: Cardiology

## 2021-09-17 ENCOUNTER — Ambulatory Visit: Payer: Medicare HMO | Admitting: Cardiology

## 2021-09-17 VITALS — BP 115/72 | HR 74 | Ht 71.0 in | Wt 153.6 lb

## 2021-09-17 DIAGNOSIS — I77819 Aortic ectasia, unspecified site: Secondary | ICD-10-CM | POA: Diagnosis not present

## 2021-09-17 DIAGNOSIS — I4819 Other persistent atrial fibrillation: Secondary | ICD-10-CM | POA: Diagnosis not present

## 2021-09-17 DIAGNOSIS — I495 Sick sinus syndrome: Secondary | ICD-10-CM

## 2021-09-17 DIAGNOSIS — R079 Chest pain, unspecified: Secondary | ICD-10-CM

## 2021-09-17 DIAGNOSIS — I10 Essential (primary) hypertension: Secondary | ICD-10-CM | POA: Diagnosis not present

## 2021-09-17 NOTE — Patient Instructions (Signed)
Medication Instructions:  Your physician recommends that you continue on your current medications as directed. Please refer to the Current Medication list given to you today.  *If you need a refill on your cardiac medications before your next appointment, please call your pharmacy*  Follow-Up: At [facility], you and your health needs are our priority.  As part of our continuing mission to provide you with exceptional heart care, we have created designated Provider Care Teams.  These Care Teams include your primary Cardiologist (physician) and Advanced Practice Providers (APPs -  Physician Assistants and Nurse Practitioners) who all work together to provide you with the care you need, when you need it.  We recommend signing up for the patient portal called "MyChart".  Sign up information is provided on this After Visit Summary.  MyChart is used to connect with patients for Virtual Visits (Telemedicine).  Patients are able to view lab/test results, encounter notes, upcoming appointments, etc.  Non-urgent messages can be sent to your provider as well.   To learn more about what you can do with MyChart, go to [email or web address].    Your next appointment:   6 month(s)  The format for your next appointment:   In Person  Provider:   Dr. Gardiner Rhyme  Important Information About Sugar

## 2021-09-19 ENCOUNTER — Ambulatory Visit (INDEPENDENT_AMBULATORY_CARE_PROVIDER_SITE_OTHER): Payer: Medicare HMO

## 2021-09-19 DIAGNOSIS — I495 Sick sinus syndrome: Secondary | ICD-10-CM | POA: Diagnosis not present

## 2021-09-19 LAB — CUP PACEART REMOTE DEVICE CHECK
Battery Remaining Longevity: 111 mo
Battery Remaining Percentage: 90 %
Battery Voltage: 3.02 V
Brady Statistic AP VP Percent: 1 %
Brady Statistic AP VS Percent: 54 %
Brady Statistic AS VP Percent: 1 %
Brady Statistic AS VS Percent: 44 %
Brady Statistic RA Percent Paced: 50 %
Brady Statistic RV Percent Paced: 1 %
Date Time Interrogation Session: 20230601020023
Implantable Lead Implant Date: 20220302
Implantable Lead Implant Date: 20220302
Implantable Lead Location: 753859
Implantable Lead Location: 753860
Implantable Pulse Generator Implant Date: 20220302
Lead Channel Impedance Value: 390 Ohm
Lead Channel Impedance Value: 410 Ohm
Lead Channel Pacing Threshold Amplitude: 0.5 V
Lead Channel Pacing Threshold Amplitude: 1 V
Lead Channel Pacing Threshold Pulse Width: 0.4 ms
Lead Channel Pacing Threshold Pulse Width: 0.5 ms
Lead Channel Sensing Intrinsic Amplitude: 3.4 mV
Lead Channel Sensing Intrinsic Amplitude: 9.3 mV
Lead Channel Setting Pacing Amplitude: 2 V
Lead Channel Setting Pacing Amplitude: 2.5 V
Lead Channel Setting Pacing Pulse Width: 0.5 ms
Lead Channel Setting Sensing Sensitivity: 2 mV
Pulse Gen Model: 2272
Pulse Gen Serial Number: 6376316

## 2021-09-23 ENCOUNTER — Ambulatory Visit
Admission: RE | Admit: 2021-09-23 | Discharge: 2021-09-23 | Disposition: A | Discharge status: Home / Self Care | Payer: Medicare HMO | Source: Ambulatory Visit | Attending: Oncology | Admitting: Oncology

## 2021-09-23 DIAGNOSIS — C480 Malignant neoplasm of retroperitoneum: Secondary | ICD-10-CM

## 2021-09-25 DIAGNOSIS — H35363 Drusen (degenerative) of macula, bilateral: Secondary | ICD-10-CM | POA: Diagnosis not present

## 2021-09-25 DIAGNOSIS — H35033 Hypertensive retinopathy, bilateral: Secondary | ICD-10-CM | POA: Diagnosis not present

## 2021-09-27 NOTE — Progress Notes (Signed)
Remote pacemaker transmission.   

## 2021-09-30 NOTE — Progress Notes (Signed)
  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 He developed fatigue and esophagitis during radiotherapy. Symptoms were treated with carafate. He is back to eating well without difficulties. He is going back to [facility] in July for repeat scans.  Impression/Plan: 1. 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 The patient has been doing well since completion of radiotherapy. We discussed that we would be happy to continue to follow him as needed, but he will also continue to follow up with Dr. Alen Blew in medical oncology and continues with [facility].      Carola Rhine, PAC

## 2021-10-03 ENCOUNTER — Ambulatory Visit: Payer: Medicare HMO

## 2021-10-03 ENCOUNTER — Other Ambulatory Visit: Payer: Medicare HMO

## 2021-10-03 ENCOUNTER — Ambulatory Visit: Payer: Medicare HMO | Admitting: Oncology

## 2021-10-11 ENCOUNTER — Other Ambulatory Visit: Payer: Self-pay | Admitting: Cardiology

## 2021-10-25 ENCOUNTER — Emergency Department (HOSPITAL_COMMUNITY): Payer: Medicare HMO

## 2021-10-25 ENCOUNTER — Emergency Department (HOSPITAL_COMMUNITY)
Admission: EM | Admit: 2021-10-25 | Discharge: 2021-10-25 | Disposition: A | Discharge status: Home / Self Care | Payer: Medicare HMO | Attending: Emergency Medicine | Admitting: Emergency Medicine

## 2021-10-25 ENCOUNTER — Encounter (HOSPITAL_COMMUNITY): Payer: Self-pay

## 2021-10-25 DIAGNOSIS — R079 Chest pain, unspecified: Secondary | ICD-10-CM | POA: Diagnosis not present

## 2021-10-25 DIAGNOSIS — C7800 Secondary malignant neoplasm of unspecified lung: Secondary | ICD-10-CM | POA: Diagnosis not present

## 2021-10-25 DIAGNOSIS — R0789 Other chest pain: Secondary | ICD-10-CM | POA: Diagnosis not present

## 2021-10-25 DIAGNOSIS — M25519 Pain in unspecified shoulder: Secondary | ICD-10-CM | POA: Diagnosis not present

## 2021-10-25 DIAGNOSIS — C3490 Malignant neoplasm of unspecified part of unspecified bronchus or lung: Secondary | ICD-10-CM | POA: Diagnosis not present

## 2021-10-25 DIAGNOSIS — I700 Atherosclerosis of aorta: Secondary | ICD-10-CM | POA: Diagnosis not present

## 2021-10-25 DIAGNOSIS — J90 Pleural effusion, not elsewhere classified: Secondary | ICD-10-CM | POA: Diagnosis not present

## 2021-10-25 DIAGNOSIS — Z7901 Long term (current) use of anticoagulants: Secondary | ICD-10-CM | POA: Insufficient documentation

## 2021-10-25 DIAGNOSIS — I480 Paroxysmal atrial fibrillation: Secondary | ICD-10-CM | POA: Diagnosis not present

## 2021-10-25 DIAGNOSIS — J189 Pneumonia, unspecified organism: Secondary | ICD-10-CM | POA: Diagnosis not present

## 2021-10-25 DIAGNOSIS — Z79899 Other long term (current) drug therapy: Secondary | ICD-10-CM | POA: Insufficient documentation

## 2021-10-25 DIAGNOSIS — R0602 Shortness of breath: Secondary | ICD-10-CM | POA: Insufficient documentation

## 2021-10-25 DIAGNOSIS — R52 Pain, unspecified: Secondary | ICD-10-CM | POA: Diagnosis not present

## 2021-10-25 HISTORY — DX: Malignant (primary) neoplasm, unspecified: C80.1

## 2021-10-25 LAB — CBC WITH DIFFERENTIAL/PLATELET
Abs Immature Granulocytes: 0.04 10*3/uL (ref 0.00–0.07)
Basophils Absolute: 0 10*3/uL (ref 0.0–0.1)
Basophils Relative: 1 %
Eosinophils Absolute: 0.3 10*3/uL (ref 0.0–0.5)
Eosinophils Relative: 3 %
HCT: 38.4 % — ABNORMAL LOW (ref 39.0–52.0)
Hemoglobin: 12.8 g/dL — ABNORMAL LOW (ref 13.0–17.0)
Immature Granulocytes: 1 %
Lymphocytes Relative: 7 %
Lymphs Abs: 0.5 10*3/uL — ABNORMAL LOW (ref 0.7–4.0)
MCH: 34.1 pg — ABNORMAL HIGH (ref 26.0–34.0)
MCHC: 33.3 g/dL (ref 30.0–36.0)
MCV: 102.4 fL — ABNORMAL HIGH (ref 80.0–100.0)
Monocytes Absolute: 0.7 10*3/uL (ref 0.1–1.0)
Monocytes Relative: 9 %
Neutro Abs: 6.3 10*3/uL (ref 1.7–7.7)
Neutrophils Relative %: 79 %
Platelets: 182 10*3/uL (ref 150–400)
RBC: 3.75 MIL/uL — ABNORMAL LOW (ref 4.22–5.81)
RDW: 14.4 % (ref 11.5–15.5)
WBC: 7.9 10*3/uL (ref 4.0–10.5)
nRBC: 0 % (ref 0.0–0.2)

## 2021-10-25 LAB — COMPREHENSIVE METABOLIC PANEL
ALT: 29 U/L (ref 0–44)
AST: 41 U/L (ref 15–41)
Albumin: 3.9 g/dL (ref 3.5–5.0)
Alkaline Phosphatase: 242 U/L — ABNORMAL HIGH (ref 38–126)
Anion gap: 8 (ref 5–15)
BUN: 22 mg/dL (ref 8–23)
CO2: 25 mmol/L (ref 22–32)
Calcium: 9 mg/dL (ref 8.9–10.3)
Chloride: 102 mmol/L (ref 98–111)
Creatinine, Ser: 1.29 mg/dL — ABNORMAL HIGH (ref 0.61–1.24)
GFR, Estimated: 57 mL/min — ABNORMAL LOW (ref >60)
Glucose, Bld: 122 mg/dL — ABNORMAL HIGH (ref 70–99)
Potassium: 4.3 mmol/L (ref 3.5–5.1)
Sodium: 135 mmol/L (ref 135–145)
Total Bilirubin: 0.8 mg/dL (ref 0.3–1.2)
Total Protein: 6.9 g/dL (ref 6.5–8.1)

## 2021-10-25 MED ORDER — LACTATED RINGERS IV SOLN: INTRAVENOUS | Status: DC

## 2021-10-25 MED ORDER — METOPROLOL TARTRATE 5 MG/5ML IV SOLN
5.0000 mg | INTRAVENOUS | Status: AC
Start: 2021-10-25 — End: 2021-10-25
  Administered 2021-10-25: 2.5 mg via INTRAVENOUS
  Filled 2021-10-25: qty 5

## 2021-10-25 NOTE — ED Triage Notes (Signed)
Pt states that he went to bed last night with mild L sided rib pain in the lower mid axillary area. Today when he woke up and was making coffee he felt pain greatly intensify and began having dyspnea. Pt states he has hx of lung cancer with known tumor in the same area. Pt has ICD, but denies feeling defibrillation.

## 2021-10-25 NOTE — ED Notes (Addendum)
Pt states understanding of dc instructions, importance of follow up. Pt denies questions or concerns upon dc. Pt assisted into wheelchair and wheeled out to private vehicle. No belongings left in room upon dc.

## 2021-10-25 NOTE — ED Provider Notes (Signed)
[place] DEPT Provider Note   CSN: [id number] Arrival date & time: 10/25/21  1013     History  Chief Complaint  Patient presents with   Chest Pain   Shortness of Breath    William Avila is a 76 y.o. male.  76 year old male with history of paroxysmal A-fib presents with pinpoint left-sided chest pain which began last evening at rest.  States pain has been constant and worse with deep breathing as well as certain positions.  Denies any rashes.  No anginal type symptoms.  No recent fever, cough, congestion.  States that he does take his Eliquis as directed.       Home Medications Prior to Admission medications   Medication Sig Start Date End Date Taking? Authorizing Provider  acetaminophen (TYLENOL) 500 MG tablet Take 500-1,000 mg by mouth every 6 (six) hours as needed for mild pain.    [provider]  apixaban (ELIQUIS) 5 MG TABS tablet TAKE 1 TABLET BY MOUTH TWICE A DAY 07/23/21   Donato Heinz, MD  HYDROcodone-acetaminophen (NORCO) 5-325 MG tablet Take 1 tablet by mouth every 6 (six) hours as needed for moderate pain. Patient not taking: Reported on 09/17/2021 07/11/21   [name], MD  lidocaine-prilocaine (EMLA) cream Apply 1 application topically as needed. Patient not taking: Reported on 09/17/2021 01/24/21   [name], MD  lisinopril (ZESTRIL) 10 MG tablet TAKE 1 TABLET BY MOUTH EVERY DAY 10/14/21   Donato Heinz, MD  lisinopril (ZESTRIL) 5 MG tablet TAKE 1 TABLET (5 MG TOTAL) BY MOUTH DAILY. IN ADDITION TO 10 MG TABLET=15 MG DAILY 10/14/21 10/09/22  Donato Heinz, MD  metoprolol succinate (TOPROL-XL) 25 MG 24 hr tablet TAKE 1 TABLET (25 MG TOTAL) BY MOUTH DAILY. 07/29/21   Donato Heinz, MD  Multiple Vitamins-Minerals (MULTIVITAMIN WITH MINERALS) tablet Take 1 tablet by mouth in the morning.    [provider]  ondansetron (ZOFRAN-ODT) 8 MG disintegrating tablet Take 8 mg by  mouth every 8 (eight) hours as needed for nausea or vomiting. 01/17/20   [provider]  Polyethyl Glycol-Propyl Glycol (SYSTANE) 0.4-0.3 % SOLN Place 1-2 drops into both eyes 3 (three) times daily as needed (dry/irritated eyes.).    [provider]  predniSONE (DELTASONE) 50 MG tablet Pt to take 50 mg of prednisone on 01/17/21 at 8:40PM, 50 mg of prednisone on 01/18/21 at 02:40AM, and 50 mg of prednisone on 01/18/21 at 08:40AM. Pt is also to take 50 mg of benadryl on 01/18/21 at 8:40. Please call [phone] with any questions. Patient not taking: Reported on 09/17/2021 01/01/21   Titus Dubin, MD  predniSONE (DELTASONE) 50 MG tablet Take 1 tablet by mouth at 4am on 05/08/21, take 1 tablet by mouth at 10am on 05/08/21, and take 1 tablet by mouth at 4pm on 05/08/21 Patient not taking: Reported on 09/17/2021 05/03/21   Gardenia Phlegm, NP  rosuvastatin (CRESTOR) 10 MG tablet Take 1 tablet (10 mg total) by mouth daily. 08/23/20 09/17/21  Donato Heinz, MD  sucralfate (CARAFATE) 1 g tablet Take 1 tablet (1 g total) by mouth 4 (four) times daily -  with meals and at bedtime. Crush 1 tablet in 1 oz water and drink 5 min before meals for radiation induced esophagitis Patient not taking: Reported on 09/17/2021 08/12/21   Hayden Pedro, PA-C  triamcinolone cream (KENALOG) 0.1 % Apply 1 application topically daily as needed (itching/irritation/rash). 04/18/21   Wilber Bihari  Cornetto, NP      Allergies    Codeine, Ibuprofen, Robaxin [methocarbamol], Chlorhexidine, Contrast media [iodinated contrast media], Fentanyl and related, Other, and Tramadol hcl    Review of Systems   Review of Systems  All other systems reviewed and are negative.   Physical Exam Updated Vital Signs BP 123/83 (BP Location: Left Arm)   Pulse (!) 111   Temp (!) 97.5 F (36.4 C) (Oral)   Resp 20   Ht 1.803 m (5\' 11" )   Wt 65.8 kg   SpO2 91%   BMI 20.22 kg/m  Physical Exam Vitals and  nursing note reviewed.  Constitutional:      General: He is not in acute distress.    Appearance: Normal appearance. He is well-developed. He is not toxic-appearing.  HENT:     Head: Normocephalic and atraumatic.  Eyes:     General: Lids are normal.     Conjunctiva/sclera: Conjunctivae normal.     Pupils: Pupils are equal, round, and reactive to light.  Neck:     Thyroid: No thyroid mass.     Trachea: No tracheal deviation.  Cardiovascular:     Rate and Rhythm: Tachycardia present. Rhythm irregular.     Heart sounds: Normal heart sounds. No murmur heard.    No gallop.  Pulmonary:     Effort: Pulmonary effort is normal. No respiratory distress.     Breath sounds: Normal breath sounds. No stridor. No decreased breath sounds, wheezing, rhonchi or rales.  Abdominal:     General: There is no distension.     Palpations: Abdomen is soft.     Tenderness: There is no abdominal tenderness. There is no rebound.  Musculoskeletal:        General: No tenderness. Normal range of motion.     Cervical back: Normal range of motion and neck supple.  Skin:    General: Skin is warm and dry.     Findings: No abrasion or rash.  Neurological:     Mental Status: He is alert and oriented to person, place, and time. Mental status is at baseline.     GCS: GCS eye subscore is 4. GCS verbal subscore is 5. GCS motor subscore is 6.     Cranial Nerves: No cranial nerve deficit.     Sensory: No sensory deficit.     Motor: Motor function is intact.  Psychiatric:        Attention and Perception: Attention normal.        Speech: Speech normal.        Behavior: Behavior normal.     ED Results / Procedures / Treatments   Labs (all labs ordered are listed, but only abnormal results are displayed) Labs Reviewed  CBC WITH DIFFERENTIAL/PLATELET  COMPREHENSIVE METABOLIC PANEL    EKG EKG Interpretation  Date/Time:  Friday October 25 2021 10:19:24 EDT Ventricular Rate:  110 PR Interval:    QRS  Duration: 80 QT Interval:  331 QTC Calculation: 448 R Axis:   66 Text Interpretation: Atrial fibrillation Multiple ventricular premature complexes Borderline ST depression, diffuse leads Confirmed by Lacretia Leigh ([id number]) on 10/25/2021 10:39:59 AM  Radiology No results found.  Procedures Procedures    Medications Ordered in ED Medications  lactated ringers infusion (has no administration in time range)  metoprolol tartrate (LOPRESSOR) injection 5 mg (has no administration in time range)    ED Course/ Medical Decision Making/ A&P  Medical Decision Making Amount and/or Complexity of Data Reviewed Labs: ordered. Radiology: ordered.  Risk Prescription drug management.   Patient presented with left-sided chest pain that was worse with movement and reproducible.  Low suspicion for ACS.  Considered other etiologies such as pneumothorax, PE, pneumonia.  Chest x-ray per my interpretation showed new airspace opacity in left lower lobe.  Patient subsequently had a chest CT and per my interpretation shows worsening right-sided metastatic disease as well as some scarring at the left side which would explain patient's pleuritic symptoms.  Patient is EKG per my interpretation showed A-fib with rapid ventricular rate response.  Patient given Lopressor 2.5 mg and as well as IV fluids and patient is back in sinus rhythm.  Patient is chronically on Eliquis.  States he frequently has paroxysmal A-fib.  Plan will be for patient to go home.  He has outpatient follow-up scheduled in 5 days at [facility].  He has pain medication at home and return precautions given  CRITICAL CARE Performed by: Leota Jacobsen Total critical care time: 50 minutes Critical care time was exclusive of separately billable procedures and treating other patients. Critical care was necessary to treat or prevent imminent or life-threatening deterioration. Critical care was time spent personally by  me on the following activities: development of treatment plan with patient and/or surrogate as well as nursing, discussions with consultants, evaluation of patient's response to treatment, examination of patient, obtaining history from patient or surrogate, ordering and performing treatments and interventions, ordering and review of laboratory studies, ordering and review of radiographic studies, pulse oximetry and re-evaluation of patient's condition.        Final Clinical Impression(s) / ED Diagnoses Final diagnoses:  None    Rx / DC Orders ED Discharge Orders     None         Lacretia Leigh, MD 10/25/21 1428

## 2021-10-30 DIAGNOSIS — C499 Malignant neoplasm of connective and soft tissue, unspecified: Secondary | ICD-10-CM | POA: Diagnosis not present

## 2021-10-30 DIAGNOSIS — C7989 Secondary malignant neoplasm of other specified sites: Secondary | ICD-10-CM | POA: Diagnosis not present

## 2021-10-30 DIAGNOSIS — C7802 Secondary malignant neoplasm of left lung: Secondary | ICD-10-CM | POA: Diagnosis not present

## 2021-10-30 DIAGNOSIS — C7801 Secondary malignant neoplasm of right lung: Secondary | ICD-10-CM | POA: Diagnosis not present

## 2021-11-11 ENCOUNTER — Other Ambulatory Visit: Payer: Self-pay

## 2021-11-11 ENCOUNTER — Other Ambulatory Visit: Payer: Self-pay | Admitting: Cardiology

## 2021-11-18 IMAGING — XA IR IMAGING GUIDED PORT INSERTION
1 series · 2 of 2 positions shown · non-contrast
Comparison: none

INDICATION: Retroperitoneal liposarcoma.

[Series 1: ir fluoro/shunt/fist · 2 of 2 slices shown]
[im 1/2]
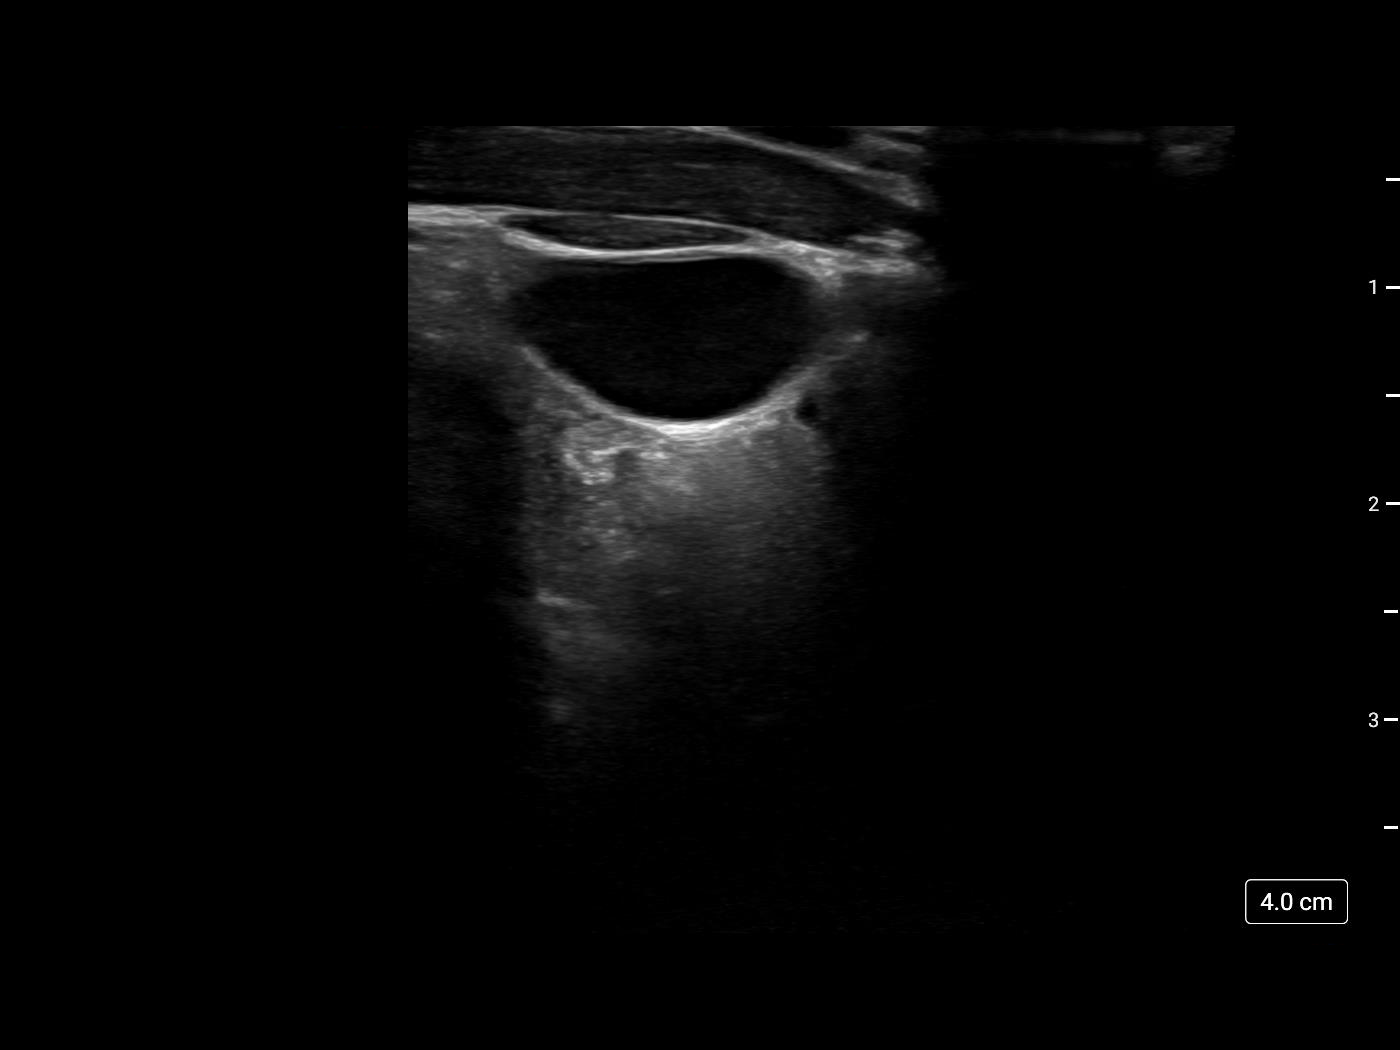
[im 2/2]
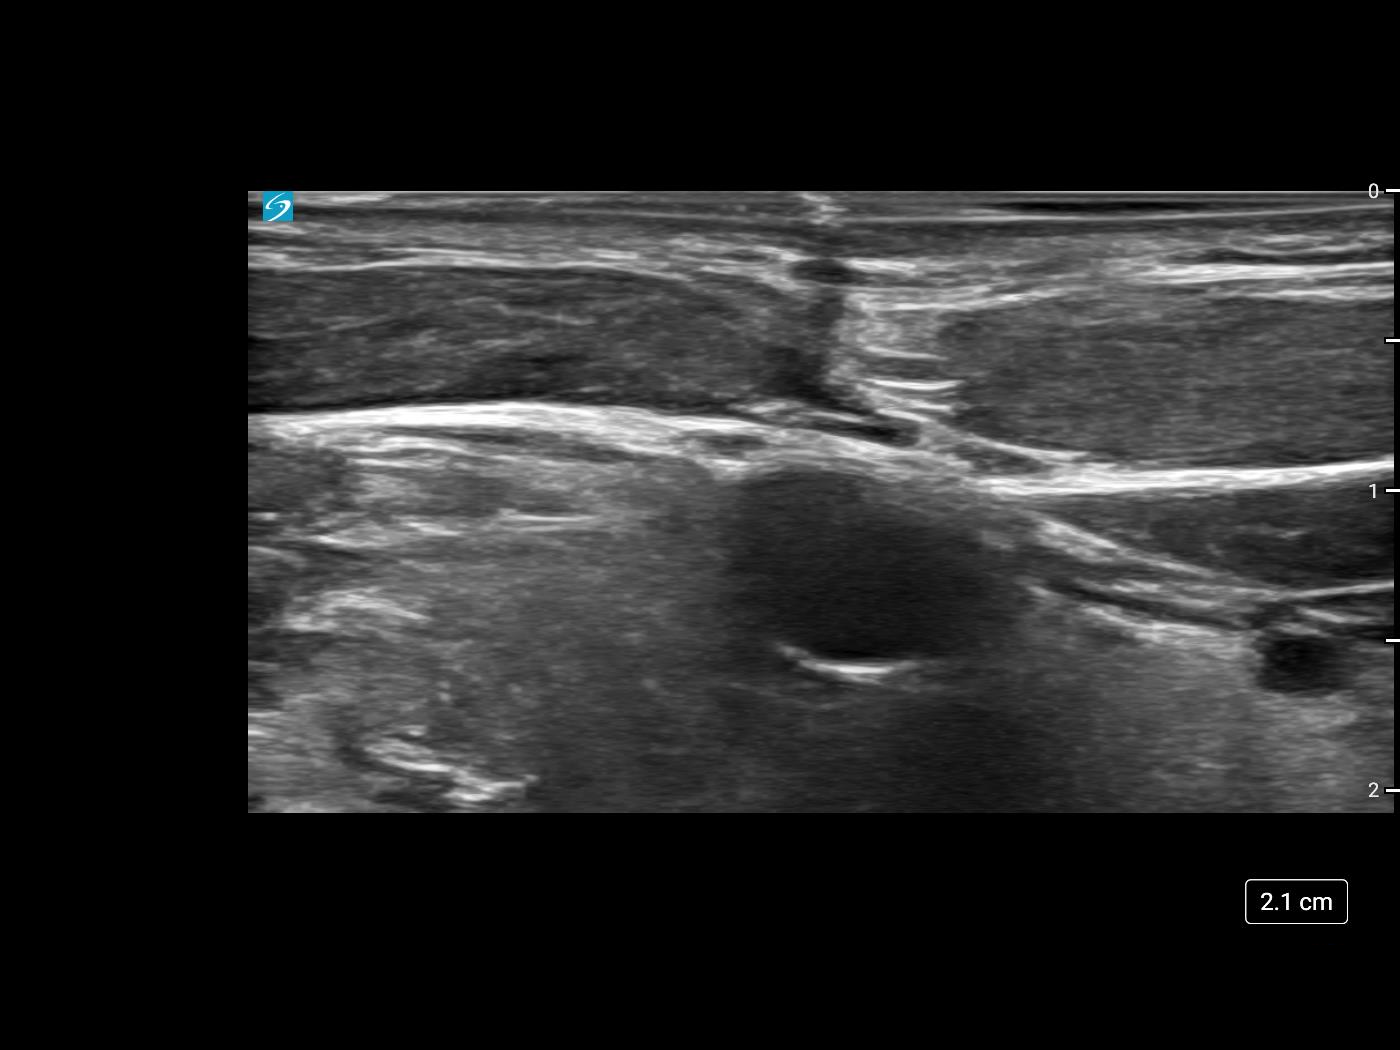

[2 of 2 positions shown; findings below may reference images not displayed]

EXAM:
IMPLANTED PORT A CATH PLACEMENT WITH ULTRASOUND AND FLUOROSCOPIC
GUIDANCE

MEDICATIONS:
25 mg diphenhydramine IV.

ANESTHESIA/SEDATION:
Single agent sedation was employed during this procedure, secondary
to patient's reported allergy to Fentanyl. Additionally IV Benadryl
was administered. A total of Versed 2 mg was administered
intravenously.

Moderate Sedation Time: 24 minutes. The patient's level of
consciousness and vital signs were monitored continuously by
radiology nursing throughout the procedure under my direct
supervision.

FLUOROSCOPY TIME:  0 minutes, 12 seconds (0 mGy)

COMPLICATIONS:
None immediate.

PROCEDURE:
The procedure, risks, benefits, and alternatives were explained to
the patient. Questions regarding the procedure were encouraged and
answered. The patient understands and consents to the procedure.

The RIGHT neck and chest were prepped with Betadine in a sterile
fashion, and a sterile drape was applied covering the operative
field. Maximum barrier sterile technique with sterile gowns and
gloves were used for the procedure. A timeout was performed prior to
the initiation of the procedure. Local anesthesia was provided with
1% lidocaine with epinephrine.

After creating a small venotomy incision, a micropuncture kit was
utilized to access the internal jugular vein under direct, real-time
ultrasound guidance. Ultrasound image documentation was performed.
The microwire was kinked to measure appropriate catheter length.

A subcutaneous port pocket was then created along the upper chest
wall utilizing a combination of sharp and blunt dissection. The
pocket was irrigated with sterile saline. A single lumen ISP power
injectable port was chosen for placement. The 8 Fr catheter was
tunneled from the port pocket site to the venotomy incision. The
port was placed in the pocket. The external catheter was trimmed to
appropriate length. At the venotomy, an 8 Fr peel-away sheath was
placed over a guidewire under fluoroscopic guidance. The catheter
was then placed through the sheath and the sheath was removed. Final
catheter positioning was confirmed and documented with a
fluoroscopic spot radiograph. The port was accessed with Petkov Renas
needle, aspirated and flushed with heparinized saline.

The port pocket incision was closed with interrupted 3-0 Vicryl
suture then Dermabond was applied, including at the venotomy
incision. Dressings were placed. The patient tolerated the procedure
well without immediate post procedural complication.
IMPRESSION: Successful placement of a right internal jugular approach power
injectable Port-A-Cath.

The catheter is ready for immediate use.

## 2021-11-25 ENCOUNTER — Inpatient Hospital Stay (HOSPITAL_COMMUNITY): Payer: Medicare HMO

## 2021-11-25 ENCOUNTER — Other Ambulatory Visit: Payer: Self-pay

## 2021-11-25 ENCOUNTER — Encounter (HOSPITAL_COMMUNITY): Payer: Self-pay | Admitting: Critical Care Medicine

## 2021-11-25 ENCOUNTER — Emergency Department (HOSPITAL_COMMUNITY): Payer: Medicare HMO

## 2021-11-25 ENCOUNTER — Inpatient Hospital Stay (HOSPITAL_COMMUNITY)
Admission: EM | Admit: 2021-11-25 | Discharge: 2021-12-20 | DRG: 871 | Disposition: E | Payer: Medicare HMO | Attending: Family Medicine | Admitting: Family Medicine

## 2021-11-25 DIAGNOSIS — R54 Age-related physical debility: Secondary | ICD-10-CM | POA: Diagnosis present

## 2021-11-25 DIAGNOSIS — K7689 Other specified diseases of liver: Secondary | ICD-10-CM | POA: Diagnosis not present

## 2021-11-25 DIAGNOSIS — N179 Acute kidney failure, unspecified: Secondary | ICD-10-CM | POA: Diagnosis not present

## 2021-11-25 DIAGNOSIS — C3490 Malignant neoplasm of unspecified part of unspecified bronchus or lung: Secondary | ICD-10-CM | POA: Diagnosis not present

## 2021-11-25 DIAGNOSIS — I10 Essential (primary) hypertension: Secondary | ICD-10-CM | POA: Diagnosis not present

## 2021-11-25 DIAGNOSIS — I495 Sick sinus syndrome: Secondary | ICD-10-CM | POA: Diagnosis present

## 2021-11-25 DIAGNOSIS — R7989 Other specified abnormal findings of blood chemistry: Secondary | ICD-10-CM

## 2021-11-25 DIAGNOSIS — D62 Acute posthemorrhagic anemia: Secondary | ICD-10-CM | POA: Diagnosis not present

## 2021-11-25 DIAGNOSIS — R531 Weakness: Secondary | ICD-10-CM | POA: Diagnosis not present

## 2021-11-25 DIAGNOSIS — R578 Other shock: Secondary | ICD-10-CM | POA: Diagnosis present

## 2021-11-25 DIAGNOSIS — C7802 Secondary malignant neoplasm of left lung: Secondary | ICD-10-CM | POA: Diagnosis present

## 2021-11-25 DIAGNOSIS — J189 Pneumonia, unspecified organism: Secondary | ICD-10-CM | POA: Diagnosis not present

## 2021-11-25 DIAGNOSIS — R34 Anuria and oliguria: Secondary | ICD-10-CM | POA: Diagnosis present

## 2021-11-25 DIAGNOSIS — R7401 Elevation of levels of liver transaminase levels: Secondary | ICD-10-CM | POA: Diagnosis present

## 2021-11-25 DIAGNOSIS — C499 Malignant neoplasm of connective and soft tissue, unspecified: Secondary | ICD-10-CM | POA: Diagnosis not present

## 2021-11-25 DIAGNOSIS — I482 Chronic atrial fibrillation, unspecified: Secondary | ICD-10-CM | POA: Diagnosis present

## 2021-11-25 DIAGNOSIS — Z515 Encounter for palliative care: Secondary | ICD-10-CM | POA: Diagnosis not present

## 2021-11-25 DIAGNOSIS — Z20822 Contact with and (suspected) exposure to covid-19: Secondary | ICD-10-CM | POA: Diagnosis not present

## 2021-11-25 DIAGNOSIS — G893 Neoplasm related pain (acute) (chronic): Secondary | ICD-10-CM | POA: Diagnosis present

## 2021-11-25 DIAGNOSIS — D589 Hereditary hemolytic anemia, unspecified: Secondary | ICD-10-CM | POA: Diagnosis present

## 2021-11-25 DIAGNOSIS — R739 Hyperglycemia, unspecified: Secondary | ICD-10-CM | POA: Diagnosis present

## 2021-11-25 DIAGNOSIS — K820 Obstruction of gallbladder: Secondary | ICD-10-CM | POA: Diagnosis not present

## 2021-11-25 DIAGNOSIS — Z8042 Family history of malignant neoplasm of prostate: Secondary | ICD-10-CM

## 2021-11-25 DIAGNOSIS — J90 Pleural effusion, not elsewhere classified: Secondary | ICD-10-CM | POA: Diagnosis present

## 2021-11-25 DIAGNOSIS — D630 Anemia in neoplastic disease: Secondary | ICD-10-CM | POA: Diagnosis present

## 2021-11-25 DIAGNOSIS — E440 Moderate protein-calorie malnutrition: Secondary | ICD-10-CM | POA: Diagnosis not present

## 2021-11-25 DIAGNOSIS — I4891 Unspecified atrial fibrillation: Secondary | ICD-10-CM

## 2021-11-25 DIAGNOSIS — R6520 Severe sepsis without septic shock: Secondary | ICD-10-CM | POA: Diagnosis not present

## 2021-11-25 DIAGNOSIS — D638 Anemia in other chronic diseases classified elsewhere: Secondary | ICD-10-CM

## 2021-11-25 DIAGNOSIS — Y95 Nosocomial condition: Secondary | ICD-10-CM | POA: Diagnosis present

## 2021-11-25 DIAGNOSIS — C7800 Secondary malignant neoplasm of unspecified lung: Secondary | ICD-10-CM

## 2021-11-25 DIAGNOSIS — C7801 Secondary malignant neoplasm of right lung: Secondary | ICD-10-CM | POA: Diagnosis not present

## 2021-11-25 DIAGNOSIS — D649 Anemia, unspecified: Secondary | ICD-10-CM | POA: Diagnosis not present

## 2021-11-25 DIAGNOSIS — R945 Abnormal results of liver function studies: Secondary | ICD-10-CM | POA: Diagnosis not present

## 2021-11-25 DIAGNOSIS — I3139 Other pericardial effusion (noninflammatory): Secondary | ICD-10-CM | POA: Diagnosis not present

## 2021-11-25 DIAGNOSIS — E785 Hyperlipidemia, unspecified: Secondary | ICD-10-CM | POA: Diagnosis not present

## 2021-11-25 DIAGNOSIS — C786 Secondary malignant neoplasm of retroperitoneum and peritoneum: Secondary | ICD-10-CM | POA: Diagnosis not present

## 2021-11-25 DIAGNOSIS — Z7901 Long term (current) use of anticoagulants: Secondary | ICD-10-CM

## 2021-11-25 DIAGNOSIS — Z950 Presence of cardiac pacemaker: Secondary | ICD-10-CM

## 2021-11-25 DIAGNOSIS — R627 Adult failure to thrive: Secondary | ICD-10-CM | POA: Diagnosis present

## 2021-11-25 DIAGNOSIS — R6521 Severe sepsis with septic shock: Secondary | ICD-10-CM | POA: Diagnosis not present

## 2021-11-25 DIAGNOSIS — R1900 Intra-abdominal and pelvic swelling, mass and lump, unspecified site: Secondary | ICD-10-CM | POA: Diagnosis not present

## 2021-11-25 DIAGNOSIS — D65 Disseminated intravascular coagulation [defibrination syndrome]: Secondary | ICD-10-CM | POA: Diagnosis present

## 2021-11-25 DIAGNOSIS — R053 Chronic cough: Secondary | ICD-10-CM | POA: Diagnosis present

## 2021-11-25 DIAGNOSIS — A419 Sepsis, unspecified organism: Principal | ICD-10-CM | POA: Diagnosis present

## 2021-11-25 DIAGNOSIS — Z66 Do not resuscitate: Secondary | ICD-10-CM | POA: Diagnosis not present

## 2021-11-25 DIAGNOSIS — R778 Other specified abnormalities of plasma proteins: Secondary | ICD-10-CM | POA: Diagnosis present

## 2021-11-25 DIAGNOSIS — Z79899 Other long term (current) drug therapy: Secondary | ICD-10-CM

## 2021-11-25 DIAGNOSIS — R339 Retention of urine, unspecified: Secondary | ICD-10-CM | POA: Diagnosis not present

## 2021-11-25 DIAGNOSIS — J9601 Acute respiratory failure with hypoxia: Secondary | ICD-10-CM | POA: Diagnosis present

## 2021-11-25 DIAGNOSIS — Z85118 Personal history of other malignant neoplasm of bronchus and lung: Secondary | ICD-10-CM

## 2021-11-25 DIAGNOSIS — R0902 Hypoxemia: Secondary | ICD-10-CM | POA: Diagnosis not present

## 2021-11-25 DIAGNOSIS — E872 Acidosis, unspecified: Secondary | ICD-10-CM | POA: Diagnosis present

## 2021-11-25 DIAGNOSIS — Z7189 Other specified counseling: Secondary | ICD-10-CM | POA: Diagnosis not present

## 2021-11-25 DIAGNOSIS — R680 Hypothermia, not associated with low environmental temperature: Secondary | ICD-10-CM | POA: Diagnosis present

## 2021-11-25 LAB — COMPREHENSIVE METABOLIC PANEL
ALT: 173 U/L — ABNORMAL HIGH (ref 0–44)
AST: 115 U/L — ABNORMAL HIGH (ref 15–41)
Albumin: 2.7 g/dL — ABNORMAL LOW (ref 3.5–5.0)
Alkaline Phosphatase: 950 U/L — ABNORMAL HIGH (ref 38–126)
Anion gap: 15 (ref 5–15)
BUN: 43 mg/dL — ABNORMAL HIGH (ref 8–23)
CO2: 15 mmol/L — ABNORMAL LOW (ref 22–32)
Calcium: 8.6 mg/dL — ABNORMAL LOW (ref 8.9–10.3)
Chloride: 106 mmol/L (ref 98–111)
Creatinine, Ser: 2.48 mg/dL — ABNORMAL HIGH (ref 0.61–1.24)
GFR, Estimated: 26 mL/min — ABNORMAL LOW (ref >60)
Glucose, Bld: 179 mg/dL — ABNORMAL HIGH (ref 70–99)
Potassium: 4.5 mmol/L (ref 3.5–5.1)
Sodium: 136 mmol/L (ref 135–145)
Total Bilirubin: 1.1 mg/dL (ref 0.3–1.2)
Total Protein: 6 g/dL — ABNORMAL LOW (ref 6.5–8.1)

## 2021-11-25 LAB — CBC WITH DIFFERENTIAL/PLATELET
Abs Immature Granulocytes: 0.08 10*3/uL — ABNORMAL HIGH (ref 0.00–0.07)
Basophils Absolute: 0 10*3/uL (ref 0.0–0.1)
Basophils Relative: 0 %
Eosinophils Absolute: 0 10*3/uL (ref 0.0–0.5)
Eosinophils Relative: 0 %
HCT: 26.1 % — ABNORMAL LOW (ref 39.0–52.0)
Hemoglobin: 8.2 g/dL — ABNORMAL LOW (ref 13.0–17.0)
Immature Granulocytes: 1 %
Lymphocytes Relative: 3 %
Lymphs Abs: 0.2 10*3/uL — ABNORMAL LOW (ref 0.7–4.0)
MCH: 32.5 pg (ref 26.0–34.0)
MCHC: 31.4 g/dL (ref 30.0–36.0)
MCV: 103.6 fL — ABNORMAL HIGH (ref 80.0–100.0)
Monocytes Absolute: 0.3 10*3/uL (ref 0.1–1.0)
Monocytes Relative: 4 %
Neutro Abs: 7.1 10*3/uL (ref 1.7–7.7)
Neutrophils Relative %: 92 %
Platelets: 322 10*3/uL (ref 150–400)
RBC: 2.52 MIL/uL — ABNORMAL LOW (ref 4.22–5.81)
RDW: 15.3 % (ref 11.5–15.5)
WBC: 7.7 10*3/uL (ref 4.0–10.5)
nRBC: 0.6 % — ABNORMAL HIGH (ref 0.0–0.2)

## 2021-11-25 LAB — LACTIC ACID, PLASMA
Lactic Acid, Venous: 7.6 mmol/L (ref 0.5–1.9)
Lactic Acid, Venous: 4.4 mmol/L (ref 0.5–1.9)

## 2021-11-25 LAB — TROPONIN I (HIGH SENSITIVITY)
Troponin I (High Sensitivity): 19 ng/L — ABNORMAL HIGH (ref <18)
Troponin I (High Sensitivity): 20 ng/L — ABNORMAL HIGH (ref <18)

## 2021-11-25 LAB — SARS CORONAVIRUS 2 BY RT PCR: SARS Coronavirus 2 by RT PCR: NEGATIVE

## 2021-11-25 LAB — APTT: aPTT: 39 seconds — ABNORMAL HIGH (ref 24–36)

## 2021-11-25 LAB — MAGNESIUM: Magnesium: 2.8 mg/dL — ABNORMAL HIGH (ref 1.7–2.4)

## 2021-11-25 LAB — ECHOCARDIOGRAM COMPLETE: Weight: 2288 oz

## 2021-11-25 LAB — PROTIME-INR
INR: 2.3 — ABNORMAL HIGH (ref 0.8–1.2)
Prothrombin Time: 24.9 seconds — ABNORMAL HIGH (ref 11.4–15.2)

## 2021-11-25 LAB — HEPARIN LEVEL (UNFRACTIONATED): Heparin Unfractionated: >1.1 IU/mL — ABNORMAL HIGH (ref 0.30–0.70)

## 2021-11-25 LAB — MRSA NEXT GEN BY PCR, NASAL: MRSA by PCR Next Gen: NOT DETECTED

## 2021-11-25 MED ORDER — SODIUM CHLORIDE 0.9 % IV SOLN
2.0000 g | Freq: Once | INTRAVENOUS | Status: AC
  Administered 2021-11-25: 2 g via INTRAVENOUS
  Filled 2021-11-25: qty 12.5

## 2021-11-25 MED ORDER — LACTATED RINGERS IV BOLUS
1000.0000 mL | Freq: Once | INTRAVENOUS | Status: AC
  Administered 2021-11-25: 1000 mL via INTRAVENOUS

## 2021-11-25 MED ORDER — METRONIDAZOLE 500 MG/100ML IV SOLN
500.0000 mg | Freq: Once | INTRAVENOUS | Status: AC
Start: 2021-11-25 — End: 2021-11-25
  Administered 2021-11-25: 500 mg via INTRAVENOUS
  Filled 2021-11-25: qty 100

## 2021-11-25 MED ORDER — VANCOMYCIN HCL IN DEXTROSE 1-5 GM/200ML-% IV SOLN: 1000.0000 mg | Freq: Once | INTRAVENOUS | Status: DC

## 2021-11-25 MED ORDER — NOREPINEPHRINE 4 MG/250ML-% IV SOLN
2.0000 ug/min | INTRAVENOUS | Status: DC
  Filled 2021-11-25: qty 250

## 2021-11-25 MED ORDER — SODIUM CHLORIDE 0.9 % IV SOLN
250.0000 mL | INTRAVENOUS | Status: DC
  Administered 2021-11-25 – 2021-11-28 (×2): 250 mL via INTRAVENOUS

## 2021-11-25 MED ORDER — SODIUM CHLORIDE 0.9 % IV SOLN
2.0000 g | INTRAVENOUS | Status: DC
  Administered 2021-11-26: 2 g via INTRAVENOUS
  Filled 2021-11-25: qty 12.5

## 2021-11-25 MED ORDER — LACTATED RINGERS IV SOLN
INTRAVENOUS | Status: AC
Start: 2021-11-25 — End: 2021-11-26

## 2021-11-25 MED ORDER — ACETAMINOPHEN 325 MG PO TABS
650.0000 mg | ORAL_TABLET | Freq: Four times a day (QID) | ORAL | Status: DC | PRN
Start: 2021-11-25 — End: 2021-11-27

## 2021-11-25 MED ORDER — HEPARIN (PORCINE) 25000 UT/250ML-% IV SOLN
900.0000 [IU]/h | INTRAVENOUS | Status: DC
  Administered 2021-11-25: 900 [IU]/h via INTRAVENOUS
  Filled 2021-11-25: qty 250

## 2021-11-25 MED ORDER — MUPIROCIN 2 % EX OINT
1.0000 | TOPICAL_OINTMENT | Freq: Two times a day (BID) | CUTANEOUS | Status: DC
  Administered 2021-11-25 – 2021-11-27 (×4): 1 via NASAL
  Filled 2021-11-25: qty 22

## 2021-11-25 MED ORDER — POLYETHYLENE GLYCOL 3350 17 G PO PACK: 17.0000 g | PACK | Freq: Every day | ORAL | Status: DC | PRN

## 2021-11-25 MED ORDER — HYDROCOD POLI-CHLORPHE POLI ER 10-8 MG/5ML PO SUER
5.0000 mL | Freq: Two times a day (BID) | ORAL | Status: DC | PRN
  Filled 2021-11-25: qty 5

## 2021-11-25 MED ORDER — OXYCODONE HCL 5 MG PO TABS
5.0000 mg | ORAL_TABLET | ORAL | Status: DC | PRN
  Administered 2021-11-26: 5 mg via ORAL
  Filled 2021-11-25: qty 1

## 2021-11-25 MED ORDER — SODIUM CHLORIDE 0.9 % IV SOLN
500.0000 mg | INTRAVENOUS | Status: DC
  Administered 2021-11-25 – 2021-11-26 (×2): 500 mg via INTRAVENOUS
  Filled 2021-11-25 (×2): qty 5

## 2021-11-25 MED ORDER — ORAL CARE MOUTH RINSE: 15.0000 mL | OROMUCOSAL | Status: DC | PRN

## 2021-11-25 MED ORDER — VANCOMYCIN HCL 1250 MG/250ML IV SOLN
1250.0000 mg | Freq: Once | INTRAVENOUS | Status: AC
  Administered 2021-11-25: 1250 mg via INTRAVENOUS
  Filled 2021-11-25 (×2): qty 250

## 2021-11-25 MED ORDER — ONDANSETRON 4 MG PO TBDP: 4.0000 mg | ORAL_TABLET | Freq: Four times a day (QID) | ORAL | Status: DC | PRN

## 2021-11-25 MED ORDER — DOCUSATE SODIUM 100 MG PO CAPS: 100.0000 mg | ORAL_CAPSULE | Freq: Two times a day (BID) | ORAL | Status: DC | PRN

## 2021-11-25 NOTE — ED Triage Notes (Signed)
Pt comes in for generalized weakness. Pt has a history of lung cancer. Pt reports feeling dehydrated and very weak.

## 2021-11-25 NOTE — Sepsis Progress Note (Signed)
Sepsis protocol monitored by eLink 

## 2021-11-25 NOTE — Progress Notes (Signed)
  Echocardiogram 2D Echocardiogram has been performed.  Johny Chess 12/15/2021, 7:30 PM

## 2021-11-25 NOTE — ED Provider Notes (Signed)
  Physical Exam  BP 100/68 (BP Location: Right Arm)   Pulse 93   Temp 99.1 F (37.3 C) (Axillary)   Resp (!) 28   Ht 5\' 10"  (1.778 m)   Wt 66.5 kg   SpO2 95%   BMI 21.04 kg/m   Physical Exam Vitals and nursing note reviewed.  Constitutional:      Comments: Speaking in full sentences.  HENT:     Head: Normocephalic and atraumatic.     Right Ear: External ear normal.     Left Ear: External ear normal.     Mouth/Throat:     Mouth: Mucous membranes are moist.     Pharynx: Oropharynx is clear.  Eyes:     Extraocular Movements: Extraocular movements intact.     Conjunctiva/sclera: Conjunctivae normal.     Pupils: Pupils are equal, round, and reactive to light.  Cardiovascular:     Rate and Rhythm: Normal rate.  Pulmonary:     Effort: Pulmonary effort is normal. No respiratory distress.     Comments: On [facility]:     Mental Status: He is alert. Mental status is at baseline.     Procedures  Procedures  ED Course / MDM   Clinical Course as of 11/26/21 0104  Mon Nov 25, 2021  1507 EKG showing A-fib with RVR rate of 141.  ST depressions consistent with ischemia.  New findings compared with prior 7/23. [MB]  6629 On review of prior notes patient has chronic A-fib.  Chest x-ray showing new large right pleural effusion from prior 1 month ago. [MB]  4765 Patient had lab work done at [facility] 2 weeks ago.  At that time his creatinine was 1.4 and his hemoglobin was 9.  Discussed CODE STATUS with wife and she wants him to be full code. [MB]  4650 I reached out to [facility] oncology Dr. Angelina Ok.  He agrees with current plan for admission at this campus.  He said the study drug can cause A-fib and can cause LFT abnormalities and he does recommend stopping the medication until patient is stabilized. [MB]  3546 Discussed with Rahul from critical care who will have the team come evaluate the patient in the ED. [MB]  5681 Assumed care from Dr Melina Copa. 76 yo M with hx of AF on AC and metastatic  sarcoma with lung mets (gets care at [facility]) has 1 week of fatigue and poor PO intake. Was 130s in AF with hypotension to the 27N systolic. Discussed with [facility]. Pleural effusion on XR with severe AKI and lactic acidosis. Given fluids (2Ls) with improvement of his hemodynamics. Started on cefepime, flagyl, and vancomycin. Last hgb was 9.0 1 week ago. New elevation in his LFTs that may be related to his study drug for his cancer. Has not been stable enough for imaging at this time. Full code verified today.  [RP]  1700 Pt transferred to ICU in critical condition. Levophed ordered by ICU team.  [RP]    Clinical Course User Index [MB] Hayden Rasmussen, MD [RP] Fransico Meadow, MD   Medical Decision Making Amount and/or Complexity of Data Reviewed Labs: ordered. Radiology: ordered.  Risk Prescription drug management. Decision regarding hospitalization.     Fransico Meadow, MD 11/26/21 [phone]

## 2021-11-25 NOTE — Progress Notes (Signed)
Pharmacy Antibiotic Note  William Avila is a 76 y.o. male admitted on 11/19/2021 with sepsis.  Pharmacy has been consulted for cefepime dosing.    Plan: Cefepime 2 gr IV q24h  Monitor clinical course, renal function, cultures as available   Weight: 64.9 kg (143 lb)  Temp (24hrs), Avg:97.4 F (36.3 C), Min:97.4 F (36.3 C), Max:97.4 F (36.3 C)  Recent Labs  Lab 12/07/2021 1425  WBC 7.7  CREATININE 2.48*  LATICACIDVEN 7.6*    Estimated Creatinine Clearance: 23.3 mL/min (A) (by C-G formula based on SCr of 2.48 mg/dL (H)).    Allergies  Allergen Reactions   Codeine Anaphylaxis, Hives and Other (See Comments)    "Throat swelling"   Fentanyl And Related Nausea Only and Other (See Comments)    Dry heaves (lasted for 14 hours)- triggered heart issues   Ibuprofen Anaphylaxis, Swelling and Other (See Comments)    low bp, low temp (to ER on 800 mg)   Methocarbamol Anaphylaxis, Hives, Swelling, Rash and Other (See Comments)    Also redness   Chlorhexidine Other (See Comments)    Unknown reaction   Contrast Media [Iodinated Contrast Media] Itching and Other (See Comments)    Itching and bumps on skin- PATIENT MUST PRE-MEDICATE WITH PREDNISONE 50 MG TABLETS, AS DIRECTED   Other Nausea Only and Other (See Comments)    Nerve Block Tray: Caused nausea/dry heaves and heart issues  PATIENT MUST TAKE PREDNISONE "PRIOR TO SCANS"   Tramadol Hcl Other (See Comments)    Changes personality, "makes him angry"    Antimicrobials this admission: 8/7 vancomycin x 1   8/7 cefepime >>   Dose adjustments this admission:   Microbiology results: 8/7 BCx:  COVID-19 PCR:  8/7 MRSA PCR:   Thank you for allowing pharmacy to be a part of this patient's care.  Royetta Asal, PharmD, BCPS 12/01/2021 5:26 PM

## 2021-11-25 NOTE — ED Provider Notes (Signed)
[facility] DEPT Provider Note   CSN: [id number] Arrival date & time: 11/30/2021  1419     History  Chief Complaint  Patient presents with   Weakness    William Avila is a 77 y.o. male.  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  He follows with his oncology care at [facility]  He is currently on a trial medication.  Has had decreased p.o. intake and feeling generally weak all over.  Chronic shortness of breath and pain in his chest.  He has had some diarrhea.  He was trying to go to [facility] today for evaluation but was too weak so wife called EMS who helped him into the car so he could come here for stabilization.  He denies any fevers or chills.  No abdominal pain.  Both he and his wife are asking for minimal intervention but just wants some IV fluids.  The history is provided by the patient and the spouse.  Weakness Severity:  Severe Onset quality:  Gradual Duration:  2 weeks Timing:  Constant Progression:  Worsening Chronicity:  New Context: change in medication   Relieved by:  Nothing Worsened by:  Activity Ineffective treatments:  Drinking fluids Associated symptoms: chest pain, cough, diarrhea, difficulty walking and shortness of breath   Associated symptoms: no abdominal pain and no fever        Home Medications Prior to Admission medications   Medication Sig Start Date End Date Taking? Authorizing Provider  acetaminophen (TYLENOL) 500 MG tablet Take 500-1,000 mg by mouth every 6 (six) hours as needed for mild pain.    [provider]  apixaban (ELIQUIS) 5 MG TABS tablet TAKE 1 TABLET BY MOUTH TWICE A DAY 07/23/21   Donato Heinz, MD  HYDROcodone-acetaminophen (NORCO) 5-325 MG tablet Take 1 tablet by mouth every 6 (six) hours as needed for moderate pain. Patient not taking: Reported on 09/17/2021 07/11/21   [name], MD  lidocaine-prilocaine (EMLA) cream Apply 1 application topically as needed. Patient not  taking: Reported on 09/17/2021 01/24/21   [name], MD  lisinopril (ZESTRIL) 10 MG tablet TAKE 1 TABLET BY MOUTH EVERY DAY 10/14/21   Donato Heinz, MD  lisinopril (ZESTRIL) 5 MG tablet TAKE 1 TABLET (5 MG TOTAL) BY MOUTH DAILY. IN ADDITION TO 10 MG TABLET=15 MG DAILY 10/14/21 10/09/22  Donato Heinz, MD  metoprolol succinate (TOPROL-XL) 25 MG 24 hr tablet TAKE 1 TABLET (25 MG TOTAL) BY MOUTH DAILY. 07/29/21   Donato Heinz, MD  Multiple Vitamins-Minerals (MULTIVITAMIN WITH MINERALS) tablet Take 1 tablet by mouth in the morning.    [provider]  ondansetron (ZOFRAN-ODT) 8 MG disintegrating tablet Take 8 mg by mouth every 8 (eight) hours as needed for nausea or vomiting. 01/17/20   [provider]  Polyethyl Glycol-Propyl Glycol (SYSTANE) 0.4-0.3 % SOLN Place 1-2 drops into both eyes 3 (three) times daily as needed (dry/irritated eyes.).    [provider]  predniSONE (DELTASONE) 50 MG tablet Pt to take 50 mg of prednisone on 01/17/21 at 8:40PM, 50 mg of prednisone on 01/18/21 at 02:40AM, and 50 mg of prednisone on 01/18/21 at 08:40AM. Pt is also to take 50 mg of benadryl on 01/18/21 at 8:40. Please call [phone] with any questions. Patient not taking: Reported on 09/17/2021 01/01/21   Titus Dubin, MD  predniSONE (DELTASONE) 50 MG tablet Take 1 tablet by mouth at 4am on 05/08/21, take 1 tablet by  mouth at 10am on 05/08/21, and take 1 tablet by mouth at 4pm on 05/08/21 Patient not taking: Reported on 09/17/2021 05/03/21   Gardenia Phlegm, NP  rosuvastatin (CRESTOR) 10 MG tablet TAKE 1 TABLET BY MOUTH EVERY DAY 11/11/21   Donato Heinz, MD  sucralfate (CARAFATE) 1 g tablet Take 1 tablet (1 g total) by mouth 4 (four) times daily -  with meals and at bedtime. Crush 1 tablet in 1 oz water and drink 5 min before meals for radiation induced esophagitis Patient not taking: Reported on 09/17/2021 08/12/21   Hayden Pedro, PA-C   triamcinolone cream (KENALOG) 0.1 % Apply 1 application topically daily as needed (itching/irritation/rash). 04/18/21   Gardenia Phlegm, NP      Allergies    Codeine, Ibuprofen, Robaxin [methocarbamol], Chlorhexidine, Contrast media [iodinated contrast media], Fentanyl and related, Other, and Tramadol hcl    Review of Systems   Review of Systems  Constitutional:  Negative for fever.  Respiratory:  Positive for cough and shortness of breath.   Cardiovascular:  Positive for chest pain.  Gastrointestinal:  Positive for diarrhea. Negative for abdominal pain.  Neurological:  Positive for weakness.    Physical Exam Updated Vital Signs BP (!) 85/66   Pulse (!) 130   Temp (!) 97.4 F (36.3 C) (Axillary)   Resp (!) 32  Physical Exam Vitals and nursing note reviewed.  Constitutional:      General: He is in acute distress.     Appearance: He is underweight. He is ill-appearing.  HENT:     Head: Normocephalic and atraumatic.  Eyes:     Conjunctiva/sclera: Conjunctivae normal.  Cardiovascular:     Rate and Rhythm: Regular rhythm. Tachycardia present.     Heart sounds: No murmur heard. Pulmonary:     Effort: Tachypnea, accessory muscle usage and respiratory distress present.     Breath sounds: Normal breath sounds.     Comments: Port right upper chest without surrounding erythema Abdominal:     Palpations: Abdomen is soft.     Tenderness: There is no abdominal tenderness. There is no guarding or rebound.  Musculoskeletal:        General: Normal range of motion.     Cervical back: Neck supple.     Right lower leg: No edema.     Left lower leg: No edema.  Skin:    General: Skin is warm and dry.     Capillary Refill: Capillary refill takes less than 2 seconds.  Neurological:     General: No focal deficit present.     ED Results / Procedures / Treatments   Labs (all labs ordered are listed, but only abnormal results are displayed) Labs Reviewed  COMPREHENSIVE  METABOLIC PANEL - Abnormal; Notable for the following components:      Result Value   CO2 15 (*)    Glucose, Bld 179 (*)    BUN 43 (*)    Creatinine, Ser 2.48 (*)    Calcium 8.6 (*)    Total Protein 6.0 (*)    Albumin 2.7 (*)    AST 115 (*)    ALT 173 (*)    Alkaline Phosphatase 950 (*)    GFR, Estimated 26 (*)    All other components within normal limits  LACTIC ACID, PLASMA - Abnormal; Notable for the following components:   Lactic Acid, Venous 7.6 (*)    All other components within normal limits  LACTIC ACID, PLASMA - Abnormal; Notable for the  following components:   Lactic Acid, Venous 4.4 (*)    All other components within normal limits  CBC WITH DIFFERENTIAL/PLATELET - Abnormal; Notable for the following components:   RBC 2.52 (*)    Hemoglobin 8.2 (*)    HCT 26.1 (*)    MCV 103.6 (*)    nRBC 0.6 (*)    Lymphs Abs 0.2 (*)    Abs Immature Granulocytes 0.08 (*)    All other components within normal limits  MAGNESIUM - Abnormal; Notable for the following components:   Magnesium 2.8 (*)    All other components within normal limits  PROTIME-INR - Abnormal; Notable for the following components:   Prothrombin Time 24.9 (*)    INR 2.3 (*)    All other components within normal limits  TROPONIN I (HIGH SENSITIVITY) - Abnormal; Notable for the following components:   Troponin I (High Sensitivity) 19 (*)    All other components within normal limits  TROPONIN I (HIGH SENSITIVITY) - Abnormal; Notable for the following components:   Troponin I (High Sensitivity) 20 (*)    All other components within normal limits  CULTURE, BLOOD (ROUTINE X 2)  CULTURE, BLOOD (ROUTINE X 2)  MRSA NEXT GEN BY PCR, NASAL  SARS CORONAVIRUS 2 BY RT PCR  URINALYSIS, ROUTINE W REFLEX MICROSCOPIC  CBC  BASIC METABOLIC PANEL  MAGNESIUM  PHOSPHORUS  TYPE AND SCREEN    EKG EKG Interpretation  Date/Time:  Monday November 25 2021 14:57:57 EDT Ventricular Rate:  141 PR Interval:    QRS  Duration: 83 QT Interval:  277 QTC Calculation: 425 R Axis:   93 Text Interpretation: Atrial fibrillation Right axis deviation Repolarization abnormality, prob rate related Minimal ST elevation, lateral leads new from prior 7/23 Confirmed by Aletta Edouard [phone]) on 12/06/2021 3:17:35 PM  Radiology DG Chest Port 1 View  Result Date: 12/08/2021 CLINICAL DATA:  Lung cancer, weakness. EXAM: PORTABLE CHEST 1 VIEW COMPARISON:  10/25/2021 and CT chest 10/25/2021. FINDINGS: Trachea is midline. Heart size is grossly stable. Thoracic aorta is calcified. Pacemaker lead tips are seen in the right atrium and right ventricle. Right IJ power port tip is in the SVC. Large right perihilar mass and partially calcified left lower lobe mass. New large right pleural effusion with volume loss in the right lung base. Somewhat improved left pleural effusion with left basilar airspace consolidation. IMPRESSION: 1. New large right pleural effusion with volume loss in the right lung base. 2. Right perihilar and left lower lobe masses. Right infrahilar mass is obscured. 3. Decreased left pleural effusion with consolidation in the left lung base, as on 10/25/2021. Electronically Signed   By: Lorin Picket M.D.   On: 12/18/2021 14:56    Procedures .Critical Care  Performed by: Hayden Rasmussen, MD Authorized by: Hayden Rasmussen, MD   Critical care provider statement:    Critical care time (minutes):  45   Critical care time was exclusive of:  Separately billable procedures and treating other patients   Critical care was necessary to treat or prevent imminent or life-threatening deterioration of the following conditions:  Sepsis and shock   Critical care was time spent personally by me on the following activities:  Development of treatment plan with patient or surrogate, discussions with consultants, evaluation of patient's response to treatment, examination of patient, obtaining history from patient or surrogate,  ordering and performing treatments and interventions, ordering and review of laboratory studies, ordering and review of radiographic studies, pulse oximetry, re-evaluation of patient's  condition and review of old charts   I assumed direction of critical care for this patient from another provider in my specialty: no       Medications Ordered in ED Medications  lactated ringers infusion (has no administration in time range)  metroNIDAZOLE (FLAGYL) IVPB 500 mg (500 mg Intravenous New Bag/Given 12/16/2021 1722)  vancomycin (VANCOREADY) IVPB 1250 mg/250 mL (has no administration in time range)  docusate sodium (COLACE) capsule 100 mg (has no administration in time range)  polyethylene glycol (MIRALAX / GLYCOLAX) packet 17 g (has no administration in time range)  0.9 %  sodium chloride infusion (has no administration in time range)  norepinephrine (LEVOPHED) 4mg  in 247mL (0.016 mg/mL) premix infusion (has no administration in time range)  ceFEPIme (MAXIPIME) 2 g in sodium chloride 0.9 % 100 mL IVPB (has no administration in time range)  lactated ringers bolus 1,000 mL (1,000 mLs Intravenous New Bag/Given 12/07/2021 1438)  lactated ringers bolus 1,000 mL (1,000 mLs Intravenous New Bag/Given 12/16/2021 1605)  ceFEPIme (MAXIPIME) 2 g in sodium chloride 0.9 % 100 mL IVPB (0 g Intravenous Stopped 11/24/2021 1706)    ED Course/ Medical Decision Making/ A&P Clinical Course as of 12/02/2021 1802  Mon Nov 25, 2021  1507 EKG showing A-fib with RVR rate of 141.  ST depressions consistent with ischemia.  New findings compared with prior 7/23. [MB]  2992 On review of prior notes patient has chronic A-fib.  Chest x-ray showing new large right pleural effusion from prior 1 month ago. [MB]  4268 Patient had lab work done at [facility] 2 weeks ago.  At that time his creatinine was 1.4 and his hemoglobin was 9.  Discussed CODE STATUS with wife and she wants him to be full code. [MB]  3419 I reached out to [facility] oncology Dr. Angelina Ok.  He  agrees with current plan for admission at this campus.  He said the study drug can cause A-fib and can cause LFT abnormalities and he does recommend stopping the medication until patient is stabilized. [MB]  6222 Discussed with Rahul from critical care who will have the team come evaluate the patient in the ED. [MB]  9798 Assumed care from Dr Melina Copa. 76 yo M with hx of AF on AC and metastatic sarcoma with lung mets (gets care at [facility]) has 1 week of fatigue and poor PO intake. Was 130s in AF with hypotension to the 92J systolic. Discussed with [facility]. Pleural effusion on XR with severe AKI and lactic acidosis. Given fluids (2Ls) with improvement of his hemodynamics. Started on cefepime, flagyl, and vancomycin. Last hgb was 9.0 1 week ago. New elevation in his LFTs that may be related to his study drug for his cancer. Has not been stable enough for imaging at this time. Full code verified today.  [RP]    Clinical Course User Index [MB] Hayden Rasmussen, MD [RP] Fransico Meadow, MD                           Medical Decision Making Amount and/or Complexity of Data Reviewed Labs: ordered. Radiology: ordered.  Risk Prescription drug management. Decision regarding hospitalization.   This patient complains of general weakness shortness of breath; this involves an extensive number of treatment Options and is a complaint that carries with it a high risk of complications and morbidity. The differential includes dehydration, metabolic derangement, sepsis, shock renal failure pneumonia, effusions, respiratory failure, rapid A-fib, ACS, perforation  I  ordered, reviewed and interpreted labs, which included CBC with normal white count hemoglobin lower than priors, chemistries with low bicarb reflecting some dehydration, elevated BUN/creatinine, elevated LFTs new, lactate significantly elevated needs to be trended, troponins elevated but flat, urinalysis ordered blood cultures and urine culture ordered,  COVID ordered I ordered medication IV fluids IV antibiotics and reviewed PMP when indicated. I ordered imaging studies which included chest x-ray and I independently    visualized and interpreted imaging which showed known liver mets, new large right effusion Additional history obtained from the patient's wife Previous records obtained and reviewed in epic including recent discharge summary here and prior oncology notes I consulted oncology at [facility] Dr. Angelina Ok and critical care here PA Rahul and discussed lab and imaging findings and discussed disposition.  Cardiac monitoring reviewed, A-fib with RVR Social determinants considered, no significant barriers Critical Interventions: Initiation of IV fluids and antibiotics for goal-directed sepsis therapy  After the interventions stated above, I reevaluated the patient and found patient to be symptomatically improved Admission and further testing considered, he will need admission to the hospital likely the ICU for further stabilization.  Wife would still like the patient to be full code.         Final Clinical Impression(s) / ED Diagnoses Final diagnoses:  Malignant neoplasm metastatic to both lungs (HCC)  AKI (acute kidney injury) ([place])  Atrial fibrillation with RVR (HCC)  Generalized weakness  Symptomatic anemia  Elevated LFTs  Severe sepsis [facility])    Rx / DC Orders ED Discharge Orders     None         Hayden Rasmussen, MD 12/01/2021 1806

## 2021-11-25 NOTE — Progress Notes (Addendum)
ANTICOAGULATION CONSULT NOTE - Initial Consult  Pharmacy Consult for PTA Eliquis >> IV heparin Indication: atrial fibrillation  Allergies  Allergen Reactions   Codeine Anaphylaxis, Hives and Other (See Comments)    "Throat swelling"   Fentanyl And Related Nausea Only and Other (See Comments)    Dry heaves (lasted for 14 hours)- triggered heart issues   Ibuprofen Anaphylaxis, Swelling and Other (See Comments)    low bp, low temp (to ER on 800 mg)   Methocarbamol Anaphylaxis, Hives, Swelling, Rash and Other (See Comments)    Also redness   Chlorhexidine Other (See Comments)    Unknown reaction   Contrast Media [Iodinated Contrast Media] Itching and Other (See Comments)    Itching and bumps on skin- PATIENT MUST PRE-MEDICATE WITH PREDNISONE 50 MG TABLETS, AS DIRECTED   Other Nausea Only and Other (See Comments)    Nerve Block Tray: Caused nausea/dry heaves and heart issues  PATIENT MUST TAKE PREDNISONE "PRIOR TO SCANS"   Tramadol Hcl Other (See Comments)    Changes personality, "makes him angry"    Patient Measurements: Weight: 64.9 kg (143 lb) Heparin Dosing Weight: 64.9 kg  Vital Signs: Temp: 97.4 F (36.3 C) (08/07 1425) Temp Source: Axillary (08/07 1425) BP: 97/71 (08/07 1745) Pulse Rate: 84 (08/07 1745)  Labs: Recent Labs    11/21/2021 1425 11/20/2021 1625  HGB 8.2*  --   HCT 26.1*  --   PLT 322  --   LABPROT 24.9*  --   INR 2.3*  --   CREATININE 2.48*  --   TROPONINIHS 19* 20*    Estimated Creatinine Clearance: 23.3 mL/min (A) (by C-G formula based on SCr of 2.48 mg/dL (H)).   Medical History: Past Medical History:  Diagnosis Date   Acute appendicitis 01/21/2014   Arthritis    Cancer (HCC)    Dysrhythmia    PSVT, bradycardia   History of hiatal hernia    Hypertension    Presence of permanent cardiac pacemaker     Medications:  Scheduled:   Assessment: 76 yo male presented to [facility] with weakness. PMH significant for metastatic sarcoma of the lung,  HLD, HTN, atrial fibrillation on Eliquis 5mg  PO BID PTA.  Last dose of Eliquis reported 11/24/21 @ ~1800.  Pharmacy has been consulted to transition Eliquis to IV heparin given poor PO intake.   Baseline aPTT and heparin level ordered.  Baseline INR 2.3. Hemoglobin 8.2, platelets 322.  Recent Eliquis administration can falsely elevated heparin levels - will dose heparin off aPTT's until both levels are correlating.  Goal of Therapy:  Heparin level 0.3-0.7 units/ml aPTT 66-102 seconds Monitor platelets by anticoagulation protocol: Yes   Plan:  No heparin bolus since recent Eliquis administration Begin heparin infusion at 900 units/hr F/u 8 hour aPTT Monitor heparin level and CBC daily F/u PO intake and ability to resume Eliquis  Dimple Nanas, PharmD, BCPS 12/02/2021 6:20 PM

## 2021-11-25 NOTE — ED Notes (Signed)
Notified nurse of critical value Lactic Acid @1751 

## 2021-11-25 NOTE — IPAL (Addendum)
  Interdisciplinary Goals of Care Family Meeting   Date carried out:: 11/22/2021  Location of the meeting: Bedside  Member's involved: Nurse Practitioner and Family Member or next of kin  Durable Power of Attorney or acting medical decision maker: Patient is currently alert and oriented and able to make medical decisions but refers to Jenson Beedle (spouse but he is unable to make decisions on his own)  Discussion: I spoke with both Charlotte Crumb and Hoyle Sauer at bedside regarding goals of care given multisystem organ dysfunction with severe sepsis on arrival.  Generally values quality of life over quantity.  Therefore if we are able to provide interventions to ensure quality of life he would like to entertain all options but if quality of life is affected or unable to be maintained family would like to discuss further transition to comfort approach.  Code status: Full Code  Disposition: Continue current acute care   Time spent for the meeting: 25 min  Whitney D. Harris 12/04/2021, 5:29 PM  I had a similar discussion with him-- full code but doesn't want prolonged life support without a return to a good quality of life. Wife is surrogate Media planner and mPOA.   Julian Hy, DO 12/11/2021 6:02 PM [facility]

## 2021-11-25 NOTE — H&P (Signed)
NAME:  William Avila, MRN:  [id number], DOB:  [birth year], LOS: 0 ADMISSION DATE:  12/01/2021, CONSULTATION DATE:  12/15/2021 REFERRING MD:  Dr. Melina Copa, CHIEF COMPLAINT:  Sepsis    History of Present Illness:  William Avila is a 76 y.o. male with a PMH significant for metastatic sarcoma of the lung (follows at [facility] on trial medication), HLD, HTN, bradycardia with pacemaker present who presented to the ED 8/3 for complaints of generalized weakness and decreased oral intake. This is associated chronic cough and left chest pain.   On admit to ED patient was seen mildly hypothermic, tachypnic, tachycardic and hypotensive. This was associated with elevated lactic acid uf 7.6, AKI, elevated LFTs and elevated HS troponin meeting criteria for severe sepsis. Therefore PCCM was consulted for further management and admission   Pertinent  Medical History  Metastatic sarcoma of the lung (follows at [facility], on trial medication), HLD, HTN, bradycardia with pacemaker present  Significant Hospital Events: Including procedures, antibiotic start and stop dates in addition to other pertinent events   8/7 presented for complaints of generalized weakness and decreased oral intake. Met sepsis criteria on admit    Interim History / Subjective:  As above   Objective   Blood pressure (!) 82/69, pulse (!) 123, temperature (!) 97.4 F (36.3 C), temperature source Axillary, resp. rate (!) 28, weight 64.9 kg, SpO2 90 %.       No intake or output data in the 24 hours ending 12/18/2021 1626 Filed Weights   12/01/2021 1622  Weight: 64.9 kg    Examination: General: Acute on chronically ill appearing deconditioned elderly male lying on ED stretcher, in NAD HEENT: [place]/AT, MM pink/moist, PERRL,  Neuro: Sleepy but easily arousable, non-focal  CV: s1s2 regular rate and rhythm, no murmur, rubs, or gallops,  PULM:  Diminished left side, clear to right on 4L , no increased work of breathing  GI: soft, bowel sounds  active in all 4 quadrants, non-tender, non-distended Extremities: warm/dry, no edema  Skin: no rashes or lesions  Resolved Hospital Problem list     Assessment & Plan:  Sever sepsis -On admit to ED patient was seen mildly hypothermic, tachypnic, tachycardic and hypotensive. This was associated with elevated lactic acid uf 7.6, AKI, elevated LFTs and elevated HS troponin meeting criteria for severe sepsis.  P: Admit ICU Supplemental oxygen for sat goal greater than Follow cultures Continue empiric cefepime and vancomycin Aggressive IV hydration provided on admission Likely will need initiation of vasopressor support given persistent hypotension, map goal greater than 65 Trend lactic acid Monitor urine output  Metastatic dedifferentiated liposarcoma of the lungs Concern for HCAP Right pleural effusion -X-ray on arrival with new large right pleural effusion with volume loss and known right and left metastatic disease P: Patient is at risk for respiratory decompensation, currently requiring 4 L nasal cannula with suboptimal oxygenation Patient and family would entertain short-term intubation if quality of life can be preserved if not patient would like to transition to comfort care Encourage pulmonary hygiene Consider bedside swallow eval Elevate head of bed Antibiotics as above Ensure adequate pain control  History of chronic Atrial Fibrillation anticoagulated with Eliquis P: Continuous telemetry Hold home Eliquis given possible need of thoracentesis Echocardiogram pending Strict intake and output Optimize electrolytes  Elevated LFTs -CT abdomen and pelvis performed at [facility] 10/30/2021 revealed no hepatic metastasis.  Likely secondary to shock state in the setting of sepsis P: Trend LFTs Avoid hepatotoxins Hold home statin   Acute Kidney  Injury  -In the setting of severe sepsis, creatinine 11/13/2021 1.4 with GFR 52 at [facility], creatinine 2.48 with GFR 26 admission  8/7 P: Follow renal function  Monitor urine output Trend Bmet Avoid nephrotoxins Ensure adequate renal perfusion  IV hydration  Deconditioning Likely protein calorie malnutrition P: Optimize nutrition as able Supplement protein PT/OT evaluations when able   Best Practice (right click and "Reselect all SmartList Selections" daily)   Diet/type: clear liquids DVT prophylaxis: other GI prophylaxis: PPI Lines: Port-A-Cath Foley:  N/A Code Status:  full code Last date of multidisciplinary goals of care discussion: See PAL note 8/7  Labs   CBC: Recent Labs  Lab 12/11/2021 1425  WBC 7.7  NEUTROABS 7.1  HGB 8.2*  HCT 26.1*  MCV 103.6*  PLT 818    Basic Metabolic Panel: Recent Labs  Lab 11/28/2021 1425  NA 136  K 4.5  CL 106  CO2 15*  GLUCOSE 179*  BUN 43*  CREATININE 2.48*  CALCIUM 8.6*  MG 2.8*   GFR: Estimated Creatinine Clearance: 23.3 mL/min (A) (by C-G formula based on SCr of 2.48 mg/dL (H)). Recent Labs  Lab 12/07/2021 1425  WBC 7.7  LATICACIDVEN 7.6*    Liver Function Tests: Recent Labs  Lab 12/14/2021 1425  AST 115*  ALT 173*  ALKPHOS 950*  BILITOT 1.1  PROT 6.0*  ALBUMIN 2.7*   No results for input(s): "LIPASE", "AMYLASE" in the last 168 hours. No results for input(s): "AMMONIA" in the last 168 hours.  ABG    Component Value Date/Time   HCO3 23.2 05/08/2007 0117   TCO2 24 05/08/2007 0117   ACIDBASEDEF 2.0 05/08/2007 0117     Coagulation Profile: Recent Labs  Lab 12/16/2021 1425  INR 2.3*    Cardiac Enzymes: No results for input(s): "CKTOTAL", "CKMB", "CKMBINDEX", "TROPONINI" in the last 168 hours.  HbA1C: Hgb A1c MFr Bld  Date/Time Value Ref Range Status  01/20/2020 05:30 AM 5.6 4.8 - 5.6 % Final    Comment:    (NOTE) Pre diabetes:          5.7%-6.4%  Diabetes:              >6.4%  Glycemic control for   <7.0% adults with diabetes     CBG: No results for input(s): "GLUCAP" in the last 168 hours.  Review of Systems:    Please see the history of present illness. All other systems reviewed and are negative   Past Medical History:  He,  has a past medical history of Acute appendicitis (01/21/2014), Arthritis, Cancer ([place]), Dysrhythmia, History of hiatal hernia, Hypertension, and Presence of permanent cardiac pacemaker.   Surgical History:   Past Surgical History:  Procedure Laterality Date   BACK SURGERY     and neck and trigger finger surgery   CARDIOVERSION N/A 08/29/2020   Procedure: CARDIOVERSION;  Surgeon: Sanda Klein, MD;  Location: [facility];  Service: Cardiovascular;  Laterality: N/A;   CERVICAL SPINE SURGERY     ESOPHAGOGASTRODUODENOSCOPY (EGD) WITH PROPOFOL Left 12/24/2015   Procedure: ESOPHAGOGASTRODUODENOSCOPY (EGD) WITH PROPOFOL;  Surgeon: Otis Brace, MD;  Location: [place];  Service: Gastroenterology;  Laterality: Left;   HIATAL HERNIA REPAIR N/A 12/26/2015   Procedure: LAPAROSCOPIC REPAIR OF HIATAL HERNIA WITH MESH;  Surgeon: Michael Boston, MD;  Location: [place];  Service: General;  Laterality: N/A;   INGUINAL HERNIA REPAIR Left 01/19/2020   Procedure: LEFT INGUINAL HERNIA REPAIR WITH MESH;  Surgeon: Stark Klein, MD;  Location: [place];  Service: General;  Laterality: Left;   INSERTION OF MESH Left 01/19/2020   Procedure: INSERTION OF MESH;  Surgeon: Stark Klein, MD;  Location: [place];  Service: General;  Laterality: Left;   IR IMAGING GUIDED PORT INSERTION  02/05/2021   LAPAROSCOPIC APPENDECTOMY N/A 01/21/2014   Procedure: APPENDECTOMY LAPAROSCOPIC;  Surgeon: Autumn Messing III, MD;  Location: [place];  Service: General;  Laterality: N/A;   LEAD REVISION/REPAIR N/A 06/26/2020   Procedure: LEAD REVISION/REPAIR;  Surgeon: [name], MD;  Location: [place] [facility];  Service: Cardiovascular;  Laterality: N/A;   PACEMAKER IMPLANT N/A 06/20/2020   Procedure: PACEMAKER IMPLANT;  Surgeon: Evans Lance, MD;  Location: [place] [facility];  Service: Cardiovascular;  Laterality: N/A;    RESECTION OF RETROPERITONEAL MASS N/A 01/19/2020   Procedure: 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 He has never used smokeless tobacco. He reports current alcohol use. He reports that he does not use drugs.   Family History:  His family history includes Cancer in his paternal uncle; Prostate cancer in his brother.   Allergies Allergies  Allergen Reactions   Codeine Anaphylaxis, Hives and Other (See Comments)    "Throat swelling"   Fentanyl And Related Nausea Only and Other (See Comments)    Dry heaves (lasted for 14 hours)- triggered heart issues   Ibuprofen Anaphylaxis, Swelling and Other (See Comments)    low bp, low temp (to ER on 800 mg)   Methocarbamol Anaphylaxis, Hives, Swelling, Rash and Other (See Comments)    Also redness   Chlorhexidine Other (See Comments)    Unknown reaction   Contrast Media [Iodinated Contrast Media] Itching and Other (See Comments)    Itching and bumps on skin- PATIENT MUST PRE-MEDICATE WITH PREDNISONE 50 MG TABLETS, AS DIRECTED   Other Nausea Only and Other (See Comments)    Nerve Block Tray: Caused nausea/dry heaves and heart issues  PATIENT MUST TAKE PREDNISONE "PRIOR TO SCANS"   Tramadol Hcl Other (See Comments)    Changes personality, "makes him angry"     Home Medications  Prior to Admission medications   Medication Sig Start Date End Date Taking? Authorizing Provider  acetaminophen (TYLENOL) 650 MG CR tablet Take 650-1,300 mg by mouth every 8 (eight) hours as needed for pain.   Yes [provider]  apixaban (ELIQUIS) 5 MG TABS tablet TAKE 1 TABLET BY MOUTH TWICE A DAY Patient taking differently: Take 5 mg by mouth 2 (two) times daily. 07/23/21  Yes Donato Heinz, MD  Hydrocod Polst-Chlorphen Polst  (CHLORPHENIRAMINE-HYDROCODONE) 10-8 MG/5ML Take 5 mLs by mouth every 12 (twelve) hours as needed for cough. 11/20/21  Yes [provider]  HYDROcodone-acetaminophen (NORCO) 5-325 MG tablet Take 1 tablet by mouth every 6 (six) hours as needed for moderate pain. Patient taking differently: Take 1 tablet by mouth every 6 (six) hours as needed (for pain- when Tylenol 650 mg is not effective). 07/11/21  Yes [name], MD  Investigational - Study Medication Take 2 tablets by mouth See admin instructions. Study name: [place] ONC abemaciclib and placebo (SARC-041) -eIRB [id number] tablet Additional study details: Take 4 tablets (200 mg total) by mouth 2 (two) times daily For Investigational use only. Do not crush. No grapefruit.   Yes [provider]  lidocaine-prilocaine (EMLA) cream Apply  1 application topically as needed. Patient taking differently: Apply 1 application  topically as needed (to decrease pain when accessing site). 01/24/21  Yes [name], MD  lisinopril (ZESTRIL) 10 MG tablet TAKE 1 TABLET BY MOUTH EVERY DAY Patient taking differently: Take 10 mg by mouth every evening. 10/14/21  Yes Donato Heinz, MD  lisinopril (ZESTRIL) 5 MG tablet TAKE 1 TABLET (5 MG TOTAL) BY MOUTH DAILY. IN ADDITION TO 10 MG TABLET=15 MG DAILY Patient taking differently: Take 5 mg by mouth every evening. 10/14/21 10/09/22 Yes Donato Heinz, MD  metoprolol succinate (TOPROL-XL) 25 MG 24 hr tablet TAKE 1 TABLET (25 MG TOTAL) BY MOUTH DAILY. Patient taking differently: Take 25 mg by mouth daily after supper. 07/29/21  Yes Donato Heinz, MD  mirtazapine (REMERON) 7.5 MG tablet Take 7.5 mg by mouth at bedtime. 11/20/21  Yes [provider]  ondansetron (ZOFRAN-ODT) 8 MG disintegrating tablet Take 8 mg by mouth every 8 (eight) hours as needed for nausea or vomiting (dissolve orally). 01/17/20  Yes [provider]  Polyethyl Glycol-Propyl Glycol (SYSTANE) 0.4-0.3 %  SOLN Place 1-2 drops into both eyes 3 (three) times daily as needed (dry/irritated eyes.).   Yes [provider]  prochlorperazine (COMPAZINE) 10 MG tablet Take 10 mg by mouth every 6 (six) hours as needed for nausea (if Zofran is unavailable). 11/22/21  Yes [provider]  rosuvastatin (CRESTOR) 10 MG tablet TAKE 1 TABLET BY MOUTH EVERY DAY Patient taking differently: Take 10 mg by mouth at bedtime. 11/11/21  Yes Donato Heinz, MD  predniSONE (DELTASONE) 50 MG tablet Pt to take 50 mg of prednisone on 01/17/21 at 8:40PM, 50 mg of prednisone on 01/18/21 at 02:40AM, and 50 mg of prednisone on 01/18/21 at 08:40AM. Pt is also to take 50 mg of benadryl on 01/18/21 at 8:40. Please call [phone] with any questions. Patient not taking: Reported on 12/10/2021 01/01/21   Titus Dubin, MD  predniSONE (DELTASONE) 50 MG tablet Take 1 tablet by mouth at 4am on 05/08/21, take 1 tablet by mouth at 10am on 05/08/21, and take 1 tablet by mouth at 4pm on 05/08/21 Patient not taking: Reported on 11/20/2021 05/03/21   Gardenia Phlegm, NP  sucralfate (CARAFATE) 1 g tablet Take 1 tablet (1 g total) by mouth 4 (four) times daily -  with meals and at bedtime. Crush 1 tablet in 1 oz water and drink 5 min before meals for radiation induced esophagitis Patient not taking: Reported on 11/27/2021 08/12/21   Hayden Pedro, PA-C  triamcinolone cream (KENALOG) 0.1 % Apply 1 application topically daily as needed (itching/irritation/rash). Patient not taking: Reported on 12/19/2021 04/18/21   Gardenia Phlegm, NP     Critical care time: 40 min  Beau Ramsburg D. Kenton Kingfisher, NP-C [facility] Personal contact information can be found on Amion  12/14/2021, 5:25 PM

## 2021-11-25 NOTE — Progress Notes (Signed)
    Echo tech is on way from home to [facility] for stat Echo.    Cecilie Kicks, FNP-C [facility] [place]  [phone] or after 5pm and on weekends call [phone] 12/14/2021.now

## 2021-11-25 NOTE — Progress Notes (Signed)
A consult was received from an ED physician for cefepime and vancomycin per pharmacy dosing.  The patient's profile has been reviewed for ht/wt/allergies/indication/available labs.   A one time order has been placed for cefepime 2g IV x1 and vancomycin 1250mg  IV x1.    Further antibiotics/pharmacy consults should be ordered by admitting physician if indicated.                       Thank you,  Dimple Nanas, PharmD, BCPS 12/12/2021 4:21 PM

## 2021-11-26 ENCOUNTER — Inpatient Hospital Stay (HOSPITAL_COMMUNITY): Payer: Medicare HMO

## 2021-11-26 ENCOUNTER — Encounter (HOSPITAL_COMMUNITY): Payer: Self-pay | Admitting: Critical Care Medicine

## 2021-11-26 DIAGNOSIS — N179 Acute kidney failure, unspecified: Secondary | ICD-10-CM

## 2021-11-26 DIAGNOSIS — J90 Pleural effusion, not elsewhere classified: Secondary | ICD-10-CM | POA: Diagnosis not present

## 2021-11-26 DIAGNOSIS — C7800 Secondary malignant neoplasm of unspecified lung: Secondary | ICD-10-CM | POA: Diagnosis not present

## 2021-11-26 DIAGNOSIS — J9601 Acute respiratory failure with hypoxia: Secondary | ICD-10-CM | POA: Diagnosis not present

## 2021-11-26 DIAGNOSIS — D638 Anemia in other chronic diseases classified elsewhere: Secondary | ICD-10-CM

## 2021-11-26 DIAGNOSIS — A419 Sepsis, unspecified organism: Secondary | ICD-10-CM | POA: Diagnosis not present

## 2021-11-26 DIAGNOSIS — R531 Weakness: Secondary | ICD-10-CM

## 2021-11-26 DIAGNOSIS — I4891 Unspecified atrial fibrillation: Secondary | ICD-10-CM

## 2021-11-26 DIAGNOSIS — R7989 Other specified abnormal findings of blood chemistry: Secondary | ICD-10-CM

## 2021-11-26 LAB — CBC
HCT: 19.7 % — ABNORMAL LOW (ref 39.0–52.0)
HCT: 23 % — ABNORMAL LOW (ref 39.0–52.0)
Hemoglobin: 6.3 g/dL — CL (ref 13.0–17.0)
Hemoglobin: 7.5 g/dL — ABNORMAL LOW (ref 13.0–17.0)
MCH: 32.8 pg (ref 26.0–34.0)
MCH: 31.9 pg (ref 26.0–34.0)
MCHC: 32 g/dL (ref 30.0–36.0)
MCHC: 32.6 g/dL (ref 30.0–36.0)
MCV: 102.6 fL — ABNORMAL HIGH (ref 80.0–100.0)
MCV: 97.9 fL (ref 80.0–100.0)
Platelets: 242 10*3/uL (ref 150–400)
Platelets: 266 10*3/uL (ref 150–400)
RBC: 1.92 MIL/uL — ABNORMAL LOW (ref 4.22–5.81)
RBC: 2.35 MIL/uL — ABNORMAL LOW (ref 4.22–5.81)
RDW: 15.2 % (ref 11.5–15.5)
RDW: 16.8 % — ABNORMAL HIGH (ref 11.5–15.5)
WBC: 9.5 10*3/uL (ref 4.0–10.5)
WBC: 10 10*3/uL (ref 4.0–10.5)
nRBC: 0.4 % — ABNORMAL HIGH (ref 0.0–0.2)
nRBC: 0.5 % — ABNORMAL HIGH (ref 0.0–0.2)

## 2021-11-26 LAB — DIC (DISSEMINATED INTRAVASCULAR COAGULATION)PANEL
D-Dimer, Quant: 3.86 ug/mL-FEU — ABNORMAL HIGH (ref 0.00–0.50)
Fibrinogen: 643 mg/dL — ABNORMAL HIGH (ref 210–475)
INR: 2 — ABNORMAL HIGH (ref 0.8–1.2)
Platelets: 299 10*3/uL (ref 150–400)
Prothrombin Time: 22.7 seconds — ABNORMAL HIGH (ref 11.4–15.2)
aPTT: 38 seconds — ABNORMAL HIGH (ref 24–36)

## 2021-11-26 LAB — EXPECTORATED SPUTUM ASSESSMENT W GRAM STAIN, RFLX TO RESP C

## 2021-11-26 LAB — URINALYSIS, ROUTINE W REFLEX MICROSCOPIC
Bilirubin Urine: NEGATIVE
Glucose, UA: NEGATIVE mg/dL
Hgb urine dipstick: NEGATIVE
Ketones, ur: NEGATIVE mg/dL
Nitrite: NEGATIVE
Protein, ur: 30 mg/dL — AB
Specific Gravity, Urine: 1.019 (ref 1.005–1.030)
pH: 5 (ref 5.0–8.0)

## 2021-11-26 LAB — BASIC METABOLIC PANEL
Anion gap: 8 (ref 5–15)
BUN: 45 mg/dL — ABNORMAL HIGH (ref 8–23)
CO2: 21 mmol/L — ABNORMAL LOW (ref 22–32)
Calcium: 8 mg/dL — ABNORMAL LOW (ref 8.9–10.3)
Chloride: 109 mmol/L (ref 98–111)
Creatinine, Ser: 2.37 mg/dL — ABNORMAL HIGH (ref 0.61–1.24)
GFR, Estimated: 28 mL/min — ABNORMAL LOW (ref >60)
Glucose, Bld: 101 mg/dL — ABNORMAL HIGH (ref 70–99)
Potassium: 4.4 mmol/L (ref 3.5–5.1)
Sodium: 138 mmol/L (ref 135–145)

## 2021-11-26 LAB — APTT: aPTT: 69 seconds — ABNORMAL HIGH (ref 24–36)

## 2021-11-26 LAB — PREPARE RBC (CROSSMATCH)

## 2021-11-26 LAB — PHOSPHORUS: Phosphorus: 4.7 mg/dL — ABNORMAL HIGH (ref 2.5–4.6)

## 2021-11-26 LAB — MAGNESIUM: Magnesium: 2.3 mg/dL (ref 1.7–2.4)

## 2021-11-26 LAB — HEPARIN LEVEL (UNFRACTIONATED): Heparin Unfractionated: >1.1 IU/mL — ABNORMAL HIGH (ref 0.30–0.70)

## 2021-11-26 MED ORDER — SODIUM CHLORIDE 0.9% IV SOLUTION
Freq: Once | INTRAVENOUS | Status: AC
Start: 2021-11-26 — End: 2021-11-26

## 2021-11-26 MED ORDER — SODIUM CHLORIDE 0.9 % IV SOLN
6.2500 mg | Freq: Four times a day (QID) | INTRAVENOUS | Status: DC | PRN
  Filled 2021-11-26: qty 0.25

## 2021-11-26 MED ORDER — FUROSEMIDE 10 MG/ML IJ SOLN
40.0000 mg | Freq: Once | INTRAMUSCULAR | Status: AC
  Administered 2021-11-26: 40 mg via INTRAVENOUS
  Filled 2021-11-26: qty 4

## 2021-11-26 MED ORDER — SODIUM CHLORIDE 0.9% IV SOLUTION: Freq: Once | INTRAVENOUS | Status: AC

## 2021-11-26 MED ORDER — HYDROMORPHONE HCL 1 MG/ML IJ SOLN
0.5000 mg | INTRAMUSCULAR | Status: DC | PRN
  Administered 2021-11-26 – 2021-11-27 (×2): 0.5 mg via INTRAVENOUS
  Filled 2021-11-26 (×2): qty 1

## 2021-11-26 MED ORDER — LORAZEPAM 2 MG/ML IJ SOLN
1.0000 mg | INTRAMUSCULAR | Status: DC | PRN
Start: 2021-11-26 — End: 2021-11-27
  Administered 2021-11-26: 1 mg via INTRAVENOUS
  Filled 2021-11-26: qty 1

## 2021-11-26 MED ORDER — NOREPINEPHRINE 4 MG/250ML-% IV SOLN: 0.0000 ug/min | INTRAVENOUS | Status: DC

## 2021-11-26 NOTE — Progress Notes (Addendum)
William Avila had increased oxygen requirements overnight.  Increased work of breathing.  Remains frail.  Right-sided ultrasound demonstrated diffuse areas of loculation and likely sick adhesions.  All pockets of fluid were too small to be able to safely access.  Discussed with his wife briefly the option of open chest tube insertion, which I do not think he would tolerate well at this time.  US of the left-sided pleural effusion demonstrates a much smaller but free-flowing pocket.    Lasix has been ordered.  Trial of BiPAP.  Needs FFP. Wife is given the contact information for his oncologist Dr. Angelina Ok at [facility].  Attempting to get in touch with him now.  Julian Hy, DO 11/26/21 8:16 AM [facility]   Spoke to Dr. Caleen Jobs at [facility]-- on clinical trial for CDK4/6 inhibitor started last week. Had been PS-1 at recent clinic visits. No bleeding complications known with this med, but diarrhea is common. Can sometimes see neutropenia. We reviewed [name] case and he shares my concern for sepsis and bleeding vs DIC/ other hemolytic process. He does not feel that transfer is necessary at this point. He reported the overall prognosis is poor for metastatic sarcoma, especially since he progressed on front line therapy, although other therapies can be offered. He is skeptical that intubation/ MV would be beneficial in a case such as this due to severe illness and ability to recover functional status. Family will be updated.  Julian Hy, DO 11/26/21 8:44 AM [place] [facility]

## 2021-11-26 NOTE — Progress Notes (Addendum)
eLink Physician-Brief Progress Note Patient Name: William Avila DOB: April 01, 1946 MRN: [id number]   Date of Service  11/26/2021  HPI/Events of Note  Urinary retention. Bladder scan with > 300 cc  eICU Interventions  IN/out cath x 1     Intervention Category Intermediate Interventions: Oliguria - evaluation and management  [name] G [name] 11/26/2021, 1:12 AM  Addendum at 5:35 am Notified that patient became hypoxic after a coughing spell Was on HFNC earlier and now on NRB mask Seen on camera HR in 90s. RR 24. O2 sat 98, does not look like he is in any distress Will get CXR . He is on antibiotics heparin. Also has a large effusion and lung mets  Addendum at 5:45 am Noted hemoglobin Is on IV heparin but no bleeding from anywhere 1 unit RBC transfusion and post transfusion H/H ordered. If any bleeding or hypotension then will need to hold iv heparin. RN to verify consent  Addendum at 6:45 am Reviewed CXR Echo from yesterday has no tamponade and RV looked ok  Not on levophed all night Does have increased O2 needs . Rn says she started the shift with 6 liter nasal o2 and he is now needing a NRB mask, though current o2 sat is 96 For now, continue the NRB since he looks better Stop IV heparin, may need thoracentesis and Hb is also 6.3 this AM so I think we can hold it and see how he does  Case d/w Dr Carlis Abbott, incoming CCM MD

## 2021-11-26 NOTE — Progress Notes (Signed)
ANTICOAGULATION CONSULT NOTE - follow up  Pharmacy Consult for PTA Eliquis >> IV heparin Indication: atrial fibrillation  Allergies  Allergen Reactions   Codeine Anaphylaxis, Hives and Other (See Comments)    "Throat swelling"   Fentanyl And Related Nausea Only and Other (See Comments)    Dry heaves (lasted for 14 hours)- triggered heart issues   Ibuprofen Anaphylaxis, Swelling and Other (See Comments)    low bp, low temp (to ER on 800 mg)   Methocarbamol Anaphylaxis, Hives, Swelling, Rash and Other (See Comments)    Also redness   Chlorhexidine Other (See Comments)    Unknown reaction   Contrast Media [Iodinated Contrast Media] Itching and Other (See Comments)    Itching and bumps on skin- PATIENT MUST PRE-MEDICATE WITH PREDNISONE 50 MG TABLETS, AS DIRECTED   Other Nausea Only and Other (See Comments)    Nerve Block Tray: Caused nausea/dry heaves and heart issues  PATIENT MUST TAKE PREDNISONE "PRIOR TO SCANS"   Tramadol Hcl Other (See Comments)    Changes personality, "makes him angry"    Patient Measurements: Height: 5\' 10"  (177.8 cm) Weight: 68.8 kg (151 lb 10.8 oz) IBW/kg (Calculated) : 73 Heparin Dosing Weight: 64.9 kg  Vital Signs: Temp: 98 F (36.7 C) (08/08 0344) Temp Source: Oral (08/08 0344) BP: 100/68 (08/08 0000) Pulse Rate: 131 (08/08 0512)  Labs: Recent Labs    12/16/2021 1425 12/16/2021 1625 12/08/2021 1822 12/15/2021 1900 11/26/21 0436  HGB 8.2*  --   --   --  6.3*  HCT 26.1*  --   --   --  19.7*  PLT 322  --   --   --  242  APTT  --   --  39*  --  69*  LABPROT 24.9*  --   --   --   --   INR 2.3*  --   --   --   --   HEPARINUNFRC  --   --   --  >1.10* >1.10*  CREATININE 2.48*  --   --   --  2.37*  TROPONINIHS 19* 20*  --   --   --      Estimated Creatinine Clearance: 25.8 mL/min (A) (by C-G formula based on SCr of 2.37 mg/dL (H)).   Medical History: Past Medical History:  Diagnosis Date   Acute appendicitis 01/21/2014   Arthritis    Cancer  (HCC)    Dysrhythmia    PSVT, bradycardia   History of hiatal hernia    Hypertension    Presence of permanent cardiac pacemaker     Medications:  Scheduled:   sodium chloride   Intravenous Once   mupirocin ointment  1 Application Nasal BID    Assessment: 76 yo male presented to [facility] with weakness. PMH significant for metastatic sarcoma of the lung, HLD, HTN, atrial fibrillation on Eliquis 5mg  PO BID PTA.  Last dose of Eliquis reported 11/24/21 @ ~1800.  Pharmacy has been consulted to transition Eliquis to IV heparin given poor PO intake.   Baseline aPTT and heparin level ordered.  Baseline INR 2.3. Hemoglobin 8.2, platelets 322.  Recent Eliquis administration can falsely elevated heparin levels - will dose heparin off aPTT's until both levels are correlating.  11/26/2021 aPTT 69 therapeutic on 900 units/hr HL > 1.10 as expected with eliquis on board Hgb down 6.3>>1 unit RBC transfusion and post transfusion H/H ordered Per RN no obvious bleeding Plts 242   Goal of Therapy:  Heparin level 0.3-0.7 units/ml  aPTT 66-102 seconds Monitor platelets by anticoagulation protocol: Yes   Plan:  continue heparin infusion at 900 units/hr F/u 8 hour aPTT Monitor heparin level and CBC daily F/u PO intake and ability to resume Eliquis  Dolly Rias RPh 11/26/2021, 6:09 AM

## 2021-11-26 NOTE — IPAL (Addendum)
  Interdisciplinary Goals of Care Family Meeting   Date carried out:: 11/26/2021  Location of the meeting: Bedside  Member's involved: Physician, Bedside Registered Nurse, and Family Member or next of kin  Durable Power of Attorney or acting medical decision maker: patient    Discussion: We discussed goals of care for William Avila .  His wife William Avila was at bedside. We reviewed his care so far and my discussion with his Oncologist this morning. He agrees that if he progresses to the point of needing life support, focusing on his comfort would make more sense than being put on the ventilator.  We reviewed the expected recovery after critical illness and how this can impact cancer treatment.  Continuing all current aggressive care measures at this point.  His daughter will be coming later today to visit.  Code status: Full DNR  Disposition: Continue current acute care   Time spent for the meeting: 10 min.  Julian Hy 11/26/2021, 10:16 AM   William Avila and his wife and I reviewed his CT findings-- he has several new masses, including what looks like a pleural based mass on the R that may be bleeding into the pleural space. This likely explains why his [place] chest imaging has such limited pockets of fluid that would be safe to tap. Although this is hard for them to hear, we discussed that at any time changing the direction of his care to focus more on comfort is not unreasonable. He feels that he is likely at this point because he does not think he will get better, but his wife does not want him to give up. He is afraid of being very uncomfortable and potentially dying uncomfortably. We assured him that we have medications to help control his symptoms and we can make these available while we continue treatment of infection and blood transfusions. Ativan, dilaudid, phenergan PRN ordered for now. His daughter will arrive early tomorrow morning; she is landing in [place] late tonight. We are  ok to liberalize his visitation.  Julian Hy, DO 11/26/21 4:33 PM [facility]

## 2021-11-26 NOTE — Progress Notes (Signed)
NAME:  William Avila, MRN:  [id number], DOB:  March 09, 1946, LOS: 1 ADMISSION DATE:  12/01/2021, CONSULTATION DATE:  12/05/2021 REFERRING MD:  Dr. Melina Copa, CHIEF COMPLAINT:  Sepsis    History of Present Illness:  William Avila is a 76 y.o. male with a PMH significant for metastatic sarcoma of the lung (follows at [facility], on trial medication), HLD, HTN, bradycardia with pacemaker present who presented to the ED 8/3 for complaints of generalized weakness and decreased oral intake. This is associated chronic cough and left chest pain.   On admit to ED patient was seen mildly hypothermic, tachypnic, tachycardic and hypotensive. This was associated with elevated lactic acid uf 7.6, AKI, elevated LFTs and elevated HS troponin meeting criteria for severe sepsis. Therefore PCCM was consulted for further management and admission   Pertinent  Medical History  Metastatic sarcoma of the lung (follows at [facility], on trial medication), HLD, HTN, bradycardia with pacemaker present  Significant Hospital Events: Including procedures, antibiotic start and stop dates in addition to other pertinent events   8/7 presented for complaints of generalized weakness and decreased oral intake. Met sepsis criteria on admit   8/8 Worsening oxygenation overnight with increased work of breathing. Now on NRB. Bedside ultrasound with loculated appearing right pleural effusion but left free flowing fluid pocket. Hgb did drop from 8.2 to 6.3  Interim History / Subjective:  He reports increased dyspnea with weakness.  Wife updated at bedside and daughter updated over the phone  100 mL urine output over the last 24 hours  Objective   Blood pressure 95/67, pulse 91, temperature 98 F (36.7 C), temperature source Oral, resp. rate (!) 30, height 5' 10"  (1.778 m), weight 68.8 kg, SpO2 94 %.    FiO2 (%):  [100 %] 100 %   Intake/Output Summary (Last 24 hours) at 11/26/2021 0710 Last data filed at 11/26/2021 0600 Gross per 24 hour   Intake 2099.27 ml  Output 300 ml  Net 1799.27 ml   Filed Weights   12/07/2021 1622 12/11/2021 1900 11/26/21 0500  Weight: 64.9 kg 66.5 kg 68.8 kg    Examination: General: Acute on chronically ill appearing frail deconditioned male lying in bed in NAD HEENT: Time/T, MM pink/moist, PERRL,  Neuro: Alert and oriented, nonfocal CV: s1s2 regular rate and rhythm, no murmur, rubs, or gallops,  PULM: Diminished breath sounds bilaterally, right greater than left, increased work of breathing, tachypnea, on nonrebreather GI: soft, bowel sounds active in all 4 quadrants, non-tender, non-distended Extremities: warm/dry, no edema  Skin: no rashes or lesions  Resolved Hospital Problem list     Assessment & Plan:  Sever sepsis -On admit to ED patient was seen mildly hypothermic, tachypnic, tachycardic and hypotensive. This was associated with elevated lactic acid uf 7.6, AKI, elevated LFTs and elevated HS troponin meeting criteria for severe sepsis.  P: Continue supplemental oxygen for sat goal > 90  Continue to follow cultures  Continue IV Cefepime and vanc  Trend lactic acid Monitor urine output  Metastatic sarcoma, originally primary to the abdomen and diagnosed in 2021, now with pulmonary mets -He was previously receiving chemo and radiation at [facility], and last week was started on a clinical trial for a new oral medication. Concern for HCAP Bilateral right greater than left pleural effusion -X-ray on arrival with new large right pleural effusion with volume loss and known right and left metastatic disease.  Ultrasound at bedside 8/8 demonstrated chronic appearing loculated effusion not amendable to safe thoracentesis.  Left effusion appears more free-flowing but much smaller in characteristic may be amendable to thoracentesis P: Patient continues to remain high risk for respiratory decompensation Trial of BiPAP for work of breathing Diurese as able We will reach out to primary  oncologist at [place] head of bed Pulmonary hygiene Antibiotics as above  History of chronic Atrial Fibrillation anticoagulated with Eliquis -Echo 8/8 EF 70 to 75% with hyperdynamic function no WMA , normal RV pressure, small circumferential pericardial effusion with no evidence of cardiac Tamponade P: Continuous telemetry Eliquis and now on heparin drip on hold secondary to drop in hemoglobin Strict intake and output Echocardiogram as above Optimize electrolytes  Elevated LFTs -CT abdomen and pelvis performed at [facility] 10/30/2021 revealed no hepatic metastasis.  Likely secondary to shock state in the setting of sepsis P: Intermittently trend LFTs Avoid hepatotoxins Hold home statin  Acute Kidney Injury  -In the setting of severe sepsis, creatinine 11/13/2021 1.4 with GFR 52 at [facility], creatinine 2.48 with GFR 26 admission 8/7 P: Diurese as above Follow renal function Monitor urine output Trend be met Avoid nephrotoxins as able Ensure adequate renal perfusion  Acute on chronic anemia - worsening  -Globin dropped from 8.4-6.3 overnight 8/8 no external signs of bleeding P: DIC panel Transfused 2 units PRBC and 2 FFP Monitor for signs of bleeding Consider ABD images to assess for RP bleed   Deconditioning Likely protein calorie malnutrition P: Optimize nutritional status as able  Best Practice (right click and "Reselect all SmartList Selections" daily)   Diet/type: clear liquids DVT prophylaxis: other GI prophylaxis: PPI Lines: Port-A-Cath Foley:  N/A Code Status:  full code Last date of multidisciplinary goals of care discussion: See PAL note 8/7  Critical care time: 45 min  Tamarick Kovalcik D. Kenton Kingfisher, NP-C [facility] Personal contact information can be found on Amion  11/26/2021, 7:10 AM

## 2021-11-27 DIAGNOSIS — Z7189 Other specified counseling: Secondary | ICD-10-CM

## 2021-11-27 DIAGNOSIS — R578 Other shock: Secondary | ICD-10-CM

## 2021-11-27 DIAGNOSIS — D62 Acute posthemorrhagic anemia: Secondary | ICD-10-CM | POA: Diagnosis not present

## 2021-11-27 DIAGNOSIS — N179 Acute kidney failure, unspecified: Secondary | ICD-10-CM | POA: Diagnosis not present

## 2021-11-27 LAB — TYPE AND SCREEN
ABO/RH(D): O NEG
Antibody Screen: NEGATIVE
Unit division: 0

## 2021-11-27 LAB — COMPREHENSIVE METABOLIC PANEL
ALT: 73 U/L — ABNORMAL HIGH (ref 0–44)
AST: 26 U/L (ref 15–41)
Albumin: 2.5 g/dL — ABNORMAL LOW (ref 3.5–5.0)
Alkaline Phosphatase: 589 U/L — ABNORMAL HIGH (ref 38–126)
Anion gap: 8 (ref 5–15)
BUN: 53 mg/dL — ABNORMAL HIGH (ref 8–23)
CO2: 22 mmol/L (ref 22–32)
Calcium: 8.3 mg/dL — ABNORMAL LOW (ref 8.9–10.3)
Chloride: 111 mmol/L (ref 98–111)
Creatinine, Ser: 2.15 mg/dL — ABNORMAL HIGH (ref 0.61–1.24)
GFR, Estimated: 31 mL/min — ABNORMAL LOW (ref >60)
Glucose, Bld: 101 mg/dL — ABNORMAL HIGH (ref 70–99)
Potassium: 4.1 mmol/L (ref 3.5–5.1)
Sodium: 141 mmol/L (ref 135–145)
Total Bilirubin: 1.2 mg/dL (ref 0.3–1.2)
Total Protein: 5.5 g/dL — ABNORMAL LOW (ref 6.5–8.1)

## 2021-11-27 LAB — PREPARE FRESH FROZEN PLASMA: Unit division: 0

## 2021-11-27 LAB — BPAM FFP
Blood Product Expiration Date: 202308132359
Blood Product Expiration Date: 202308132359
ISSUE DATE / TIME: 202308081051
ISSUE DATE / TIME: 202308081241
Unit Type and Rh: 5100
Unit Type and Rh: 9500

## 2021-11-27 LAB — BPAM RBC
Blood Product Expiration Date: 202308232359
ISSUE DATE / TIME: 202308080814
Unit Type and Rh: 9500

## 2021-11-27 MED ORDER — ACETAMINOPHEN 325 MG PO TABS: 650.0000 mg | ORAL_TABLET | Freq: Four times a day (QID) | ORAL | Status: DC | PRN

## 2021-11-27 MED ORDER — ACETAMINOPHEN 650 MG RE SUPP: 650.0000 mg | Freq: Four times a day (QID) | RECTAL | Status: DC | PRN

## 2021-11-27 MED ORDER — IPRATROPIUM-ALBUTEROL 0.5-2.5 (3) MG/3ML IN SOLN
3.0000 mL | RESPIRATORY_TRACT | Status: DC
Start: 2021-11-27 — End: 2021-11-28
  Administered 2021-11-27 – 2021-11-28 (×6): 3 mL via RESPIRATORY_TRACT
  Filled 2021-11-27 (×6): qty 3

## 2021-11-27 MED ORDER — GLYCOPYRROLATE 1 MG PO TABS: 1.0000 mg | ORAL_TABLET | ORAL | Status: DC | PRN

## 2021-11-27 MED ORDER — IPRATROPIUM-ALBUTEROL 0.5-2.5 (3) MG/3ML IN SOLN
3.0000 mL | RESPIRATORY_TRACT | Status: DC | PRN
  Administered 2021-11-27: 3 mL via RESPIRATORY_TRACT
  Filled 2021-11-27: qty 3

## 2021-11-27 MED ORDER — POLYVINYL ALCOHOL 1.4 % OP SOLN
1.0000 [drp] | Freq: Four times a day (QID) | OPHTHALMIC | Status: DC | PRN
  Filled 2021-11-27: qty 15

## 2021-11-27 MED ORDER — HYDROMORPHONE HCL 1 MG/ML IJ SOLN
0.5000 mg | INTRAMUSCULAR | Status: DC | PRN
  Administered 2021-11-27: 0.5 mg via INTRAVENOUS
  Administered 2021-11-27 (×2): 1 mg via INTRAVENOUS
  Administered 2021-11-27: 0.5 mg via INTRAVENOUS
  Administered 2021-11-28 – 2021-11-29 (×8): 1 mg via INTRAVENOUS
  Filled 2021-11-27 (×12): qty 1

## 2021-11-27 MED ORDER — HYDROMORPHONE HCL 1 MG/ML IJ SOLN
0.5000 mg | INTRAMUSCULAR | Status: DC | PRN
  Administered 2021-11-27: 0.5 mg via INTRAVENOUS
  Filled 2021-11-27: qty 1

## 2021-11-27 MED ORDER — LORAZEPAM 2 MG/ML IJ SOLN: 1.0000 mg | INTRAMUSCULAR | Status: DC | PRN

## 2021-11-27 MED ORDER — GLYCOPYRROLATE 0.2 MG/ML IJ SOLN
0.2000 mg | INTRAMUSCULAR | Status: DC | PRN
  Administered 2021-11-29: 0.2 mg via INTRAVENOUS
  Filled 2021-11-27: qty 1

## 2021-11-27 MED ORDER — SODIUM CHLORIDE 0.9 % IV SOLN
0.0000 mg/h | INTRAVENOUS | Status: DC
  Administered 2021-11-28: 0.5 mg/h via INTRAVENOUS
  Administered 2021-11-29: 2 mg/h via INTRAVENOUS
  Filled 2021-11-27 (×3): qty 5

## 2021-11-27 MED ORDER — HYDROMORPHONE BOLUS VIA INFUSION: 1.0000 mg | INTRAVENOUS | Status: DC | PRN

## 2021-11-27 MED ORDER — GLYCOPYRROLATE 0.2 MG/ML IJ SOLN: 0.2000 mg | INTRAMUSCULAR | Status: DC | PRN

## 2021-11-27 MED ORDER — OLANZAPINE 10 MG IM SOLR: 5.0000 mg | Freq: Four times a day (QID) | INTRAMUSCULAR | Status: DC | PRN

## 2021-11-27 NOTE — IPAL (Addendum)
  Interdisciplinary Goals of Care Family Meeting   Date carried out:: 11/27/2021  Location of the meeting: Bedside  Member's involved: Physician and Family Member or next of kin  Durable Power of Attorney or Loss adjuster, chartered: wife, patient daughter    Discussion: We discussed goals of care for William Avila .  SOB better with dilaudid, had side effects with Ativan. Enjoying the company of his dog and family today. We discussed turning the monitor alarms to comfort mode where they will not go off and be disruptive, which they want. Orders for PRNs placed, now scheduled breathing treatments.  Code status: Full DNR  Disposition: Continue current acute care, transitioning towards more symptom management.   Time spent for the meeting: 15 min.  Julian Hy 11/27/2021, 12:56 PM   I spoke to William Avila, his wife, and his daughter this afternoon with his nurse. He is at the point where he wants to focus fully on his comfort rather than continuing life-prolonging therapies. He understands that he is in a terminal stage and will not likely survive this hospitalization. Unfortunately due to high oxygen requirements, he is not a candidate for home hospice currently. They do not think he requires a dilaudid infusion and would prefer to stay PRN for now, but understand that overnight this may change. Comfort orders placed, will have dilaudid infusion available if they are needing to switch to an infusion due to frequent dosing. All non-comfort focused meds have been discontinued.  Julian Hy, DO 11/27/21 3:36 PM [facility]

## 2021-11-27 NOTE — Progress Notes (Signed)
Chaplain introduced spiritual care services to William Avila and his family.  At this time, they feel that all their needs are being met.  Spiritual care will continue to follow.  Please page as needs arise.  [address] [place] Pager, [phone]

## 2021-11-27 NOTE — Progress Notes (Signed)
NAME:  William Avila, MRN:  [id number], DOB:  [birth year], LOS: 2 ADMISSION DATE:  12/15/2021, CONSULTATION DATE:  12/05/2021 REFERRING MD:  Dr. Melina Avila, CHIEF COMPLAINT:  Sepsis    History of Present Illness:  William Avila is a 76 y.o. male with a PMH significant for metastatic sarcoma of the lung (follows at [facility], on trial medication), HLD, HTN, bradycardia with pacemaker present who presented to the ED 8/3 for complaints of generalized weakness and decreased oral intake. This is associated chronic cough and left chest pain.   On admit to ED patient was seen mildly hypothermic, tachypnic, tachycardic and hypotensive. This was associated with elevated lactic acid uf 7.6, AKI, elevated LFTs and elevated HS troponin meeting criteria for severe sepsis. Therefore PCCM was consulted for further management and admission   Pertinent  Medical History  Metastatic sarcoma of the lung (follows at [facility], on trial medication), HLD, HTN, bradycardia with pacemaker present  Significant Hospital Events: Including procedures, antibiotic start and stop dates in addition to other pertinent events   8/7 presented for complaints of generalized weakness and decreased oral intake. Met sepsis criteria on admit   8/8 Worsening oxygenation overnight with increased work of breathing. Now on NRB. Bedside ultrasound with loculated appearing right pleural effusion but left free flowing fluid pocket. Hgb did drop from 8.2 to 6.3 8/9 Seen lying in bed on NRB, no acute events overnight.   Interim History / Subjective:  Appears a bit brighter this morning but still severely deconditioned   Objective   Blood pressure 110/74, pulse (!) 38, temperature 98.7 F (37.1 C), temperature source Axillary, resp. rate 18, height 5' 10" (1.778 m), weight 67.6 kg, SpO2 91 %.        Intake/Output Summary (Last 24 hours) at 11/27/2021 0706 Last data filed at 11/26/2021 1900 Gross per 24 hour  Intake 1672.38 ml  Output 950 ml   Net 722.38 ml    Filed Weights   11/28/2021 1900 11/26/21 0500 11/27/21 0500  Weight: 66.5 kg 68.8 kg 67.6 kg    Examination: General: Acute on chronically ill appearing deconditioned frail elderly male lying in bed on NRB, in NAD HEENT: [place]/AT, MM pink/moist, PERRL,  Neuro: Alert and oriented x3, very weak  CV: s1s2 regular rate and rhythm, no murmur, rubs, or gallops,  PULM:  Significantly decreased breath sounds on right, rhonchi to left, on NRB with mild increased work of breathing  GI: soft, bowel sounds active in all 4 quadrants, non-tender, non-distended, tolerating sips of liquids Extremities: warm/dry, no edema  Skin: no rashes or lesions  Resolved Hospital Problem list     Assessment & Plan:  Severe sepsis, shock resolved  -On admit to ED patient was seen mildly hypothermic, tachypnic, tachycardic and hypotensive. This was associated with elevated lactic acid uf 7.6, AKI, elevated LFTs and elevated HS troponin meeting criteria for severe sepsis.  P: Continue supplemental oxygen for sat goal > 90 Continue to follow cultures  Remains on IV Cefepime and vancomycin  Monitor urine output   Metastatic sarcoma, originally primary to the abdomen and diagnosed in 2021, now with pulmonary mets -He was previously receiving chemo and radiation at [facility] and last week was started on a clinical trial for a new oral medication. -Spoke with primary oncologist 8/8 with recommendation of supportive care with no immediate need for transfer given critical illness  Concern for HCAP Bilateral right greater than left pleural effusion -X-ray on arrival with new large right pleural  effusion with volume loss and known right and left metastatic disease.  Ultrasound at bedside 8/8 demonstrated chronic appearing loculated effusion not amendable to safe thoracentesis.  Left effusion appears more free-flowing but much smaller in characteristic may be amendable to thoracentesis P: Decision  made for DNR/DNI 8/8 Continue supplemental oxygen for sat goal > 90 PRN BIPAP  Diurese as able  Pulmonary hygiene  Antibiotics as above   History of chronic Atrial Fibrillation anticoagulated with Eliquis -Echo 8/8 EF 70 to 75% with hyperdynamic function no WMA , normal RV pressure, small circumferential pericardial effusion with no evidence of cardiac Tamponade P: Continuous telemetry Continue to hold anticoagulation  Strict intake and output  Echo as above  Optimize electrolytes   Elevated LFTs -CT abdomen and pelvis performed at [facility] 10/30/2021 revealed no hepatic metastasis.  Likely secondary to shock state in the setting of sepsis P: CMP pending this am Continue to avoid hepatoxic  Hold home statin   Acute Kidney Injury  -In the setting of severe sepsis, creatinine 11/13/2021 1.4 with GFR 52 at [facility], creatinine 2.48 with GFR 26 admission 8/7 P: Follow renal function  Monitor urine output Trend Bmet Avoid nephrotoxins Ensure adequate renal perfusion   Acute on chronic anemia - worsening  -Globin dropped from 8.4-6.3 overnight 8/8 no external signs of bleeding P: Peripheral smear with Schistocytes but given chronic anticoagulation difficult to distinguish formal DIC  Trend CBC  Transfuse per protocol  Hgb goal >7  Deconditioning Likely protein calorie malnutrition P: As able optimize nutrition    Best Practice (right click and "Reselect all SmartList Selections" daily)   Diet/type: clear liquids DVT prophylaxis: other GI prophylaxis: PPI Lines: Port-A-Cath Foley:  N/A Code Status:  full code Last date of multidisciplinary goals of care discussion: See PAL note 8/8  Critical care time: 40 min  William Hughson D. Kenton Kingfisher, NP-C [place] [facility] Personal contact information can be found on Amion  11/27/2021, 7:06 AM

## 2021-11-27 NOTE — Progress Notes (Signed)
[place] Progress Note Patient Name: BRIYAN KLEVEN DOB: 05-30-1945 MRN: [id number]   Date of Service  11/27/2021  HPI/Events of Note  Patient comfort care - Nursing request to transfer patient to Palliative Care bed.   eICU Interventions  Will transfer patient to Palliative Care bed.      Intervention Category Major Interventions: Other:  Maitlyn Penza Cornelia Copa 11/27/2021, 8:14 PM

## 2021-11-28 ENCOUNTER — Encounter (HOSPITAL_COMMUNITY): Payer: Self-pay | Admitting: Critical Care Medicine

## 2021-11-28 DIAGNOSIS — A419 Sepsis, unspecified organism: Secondary | ICD-10-CM | POA: Diagnosis not present

## 2021-11-28 DIAGNOSIS — Z7189 Other specified counseling: Secondary | ICD-10-CM | POA: Diagnosis not present

## 2021-11-28 DIAGNOSIS — J9601 Acute respiratory failure with hypoxia: Secondary | ICD-10-CM | POA: Diagnosis not present

## 2021-11-28 DIAGNOSIS — N179 Acute kidney failure, unspecified: Secondary | ICD-10-CM | POA: Diagnosis not present

## 2021-11-28 LAB — CULTURE, RESPIRATORY W GRAM STAIN
Culture: NORMAL
Gram Stain: NONE SEEN

## 2021-11-28 MED ORDER — MIDAZOLAM HCL 2 MG/2ML IJ SOLN
1.0000 mg | INTRAMUSCULAR | Status: DC | PRN
  Administered 2021-11-29: 1 mg via INTRAVENOUS
  Filled 2021-11-28: qty 2

## 2021-11-28 MED ORDER — IPRATROPIUM-ALBUTEROL 0.5-2.5 (3) MG/3ML IN SOLN
3.0000 mL | Freq: Four times a day (QID) | RESPIRATORY_TRACT | Status: DC
Start: 2021-11-28 — End: 2021-11-29
  Filled 2021-11-28 (×3): qty 3

## 2021-11-28 NOTE — Progress Notes (Signed)
NAME:  William Avila, MRN:  [id number], DOB:  [birth year], LOS: 3 ADMISSION DATE:  12/17/2021, CONSULTATION DATE:  12/03/2021 REFERRING MD:  Dr. Melina Copa, CHIEF COMPLAINT:  Sepsis    History of Present Illness:  76 y/o male with a PMH significant for metastatic sarcoma of the lung (follows at [facility], on trial medication), HLD, HTN, bradycardia with pacemaker present who presented to the ED 8/3 for complaints of generalized weakness and decreased oral intake. This is associated chronic cough and left chest pain.   On admit to ED patient was seen mildly hypothermic, tachypnic, tachycardic and hypotensive. This was associated with elevated lactic acid of 7.6, AKI, elevated LFTs and elevated HS troponin meeting criteria for severe sepsis. Therefore PCCM was consulted for further management and admission   Pertinent  Medical History  Metastatic sarcoma of the lung (follows at [facility], on trial medication), HLD, HTN, bradycardia with pacemaker present  Significant Hospital Events: Including procedures, antibiotic start and stop dates in addition to other pertinent events   8/7 presented for complaints of generalized weakness and decreased oral intake. Met sepsis criteria on admit   8/8 Worsening oxygenation overnight with increased work of breathing. Now on NRB. Bedside ultrasound with loculated appearing right pleural effusion but left free flowing fluid pocket. Hgb did drop from 8.2 to 6.3 8/9 Seen lying in bed on NRB, no acute events overnight.   Interim History / Subjective:  Tmax 99  Transferred to Palliative Care floor overnight. Mouth is dry. Has not urinated yet today. Briefly felt like he needed to go to the bathroom, but 1 is unable to go.  Currently denies feeling like he needs to go.  He wants to try something cool and sweet to eat like applesauce.  He is too tired to chew.  His daughter is at bedside.  Objective   Blood pressure 103/77, pulse 60, temperature 99 F (37.2 C), temperature  source Axillary, resp. rate (!) 24, height 5' 10"  (1.778 m), weight 67.6 kg, SpO2 (!) 88 %.    FiO2 (%):  [100 %] 100 %   Intake/Output Summary (Last 24 hours) at 11/28/2021 1229 Last data filed at 11/28/2021 0600 Gross per 24 hour  Intake 180 ml  Output 325 ml  Net -145 ml   Filed Weights   12/04/2021 1900 11/26/21 0500 11/27/21 0500  Weight: 66.5 kg 68.8 kg 67.6 kg    Examination: General:  frail, ill appearing elderly man lying in bed in NAD HEENT: [place]/AT, eyes anicteric Neuro: arousable to stimulation, answering questions appropriately, tremulous and globally weak CV: s1s2, RRR, no m/r/g PULM:  reduced R breath sounds, rhales on left. No rhonchi. Infrequent cough. GI: thin, nondistended Extremities: warm/dry, no peripheral edema  Skin: no rashes or wounds  Resolved Hospital Problem list   Septic Shock   Assessment & Plan:    Severe Sepsis  Metastatic Sarcoma - primary abdomen with pulmonary mets  Concern for HCAP  R>L Pleural Effusions - new large right effusion with volume loss, known right metastatic disease Atrial Fibrillation on Eliquis  Elevated LFT's - no known hepatic metastasis AKI Acute on Chronic Anemia, acute blood loss anemia from pulmonary mets Deconditioning  Moderate Protein Calorie Malnutrition    Concern for sepsis with shock on admit.  Baseline metastatic sarcoma with primary abdominal source diagnosed in 2021 with pulmonary metastasis.  Followed at [facility] for chemo + XRT and clinical trial.  Primary ONC at [facility] aware of admission and supportive of palliative approach.  Transitioned  to full DNR/DNI on 8/8.  Transferred to floor for palliative care overnight. Worsening of CT chest findings this admission with concern for pleural based mass on right that may be bleeding into the pleural space.  Family & patient elected for transition to comfort measures.    -supportive care  -allow for comfort feeding as patient requests > spoke to nurse about trying apple  sauce and maybe chocolate milk -continue robinul for secretions  -PRN dilaudid for pain; daughter said may be ready soon for the infusion -PRN cough suppression - dilaudid has been helping with this as well -PRN zofran for nausea  -DNR/DNI  -hold further labs  -all measures to promote patient comfort   Daughter updated at bedside. Her husband and kids are coming to visit tomorrow.   Best Practice (right click and "Reselect all SmartList Selections" daily)  Diet/type: dysphagia diet (see orders)- pleasure feeding DVT prophylaxis: other GI prophylaxis: PPI Lines: Port-A-Cath Foley:  N/A Code Status:  full code Last date of multidisciplinary goals of care discussion: See PAL note 8/8     Julian Hy, DO 11/28/21 3:27 PM [facility]

## 2021-11-28 NOTE — Progress Notes (Signed)
eLink Physician-Brief Progress Note Patient Name: William Avila DOB: June 28, 1945 MRN: [id number]   Date of Service  11/28/2021  HPI/Events of Note  Patient is on [place]. Currently on a Dilaudid gtt which is at its ceiling dose and 2 boluses have been given this shift. Nursing request for additional medication for increased work of breathing.   eICU Interventions  Plan: Add Versed 1-2 mg IV Q 2 hours PRN comfort or increased work of breathing.      Intervention Category Major Interventions: Other:  Lysle Dingwall 11/28/2021, 11:12 PM

## 2021-11-28 NOTE — TOC Progression Note (Signed)
Transition of Care (TOC) - Progression Note   Transition of Care (TOC) Screening Note  Patient Details  Name: William Avila Date of Birth: 16-Jun-1945  Transition of Care [facility]) CM/SW Contact:    Sherie Don, LCSW Phone Number: 11/28/2021, 9:54 AM  Transition of Care Department [facility]) has reviewed patient and no TOC needs have been identified at this time. We will continue to monitor patient advancement through interdisciplinary progression rounds. If new patient transition needs arise, please place a TOC consult.  Readmission Risk Interventions    11/28/2021    9:53 AM  Readmission Risk Prevention Plan  PCP or Specialist appointment within 3-5 days of discharge Not Complete  PCP/Specialist Appt Not Complete comments Anticipating hospital death  [place] or [place] Not Complete  HRI or Home Care Consult Pt Refusal Comments Anticipating hospital death  SW Recovery Care/Counseling Consult Complete  Palliative Care Screening Complete  [place] Not Applicable

## 2021-11-28 NOTE — Plan of Care (Signed)
  Problem: Clinical Measurements: Goal: Respiratory complications will improve Outcome: Progressing   Problem: Clinical Measurements: Goal: Cardiovascular complication will be avoided Outcome: Progressing   Problem: Elimination: Goal: Will not experience complications related to bowel motility Outcome: Progressing   Problem: Skin Integrity: Goal: Risk for impaired skin integrity will decrease Outcome: Progressing

## 2021-11-29 DIAGNOSIS — A419 Sepsis, unspecified organism: Secondary | ICD-10-CM | POA: Diagnosis not present

## 2021-11-29 DIAGNOSIS — N179 Acute kidney failure, unspecified: Secondary | ICD-10-CM | POA: Diagnosis not present

## 2021-11-29 DIAGNOSIS — R6521 Severe sepsis with septic shock: Secondary | ICD-10-CM | POA: Diagnosis not present

## 2021-11-30 LAB — CULTURE, BLOOD (ROUTINE X 2)
Culture: NO GROWTH
Culture: NO GROWTH
Special Requests: ADEQUATE
Special Requests: ADEQUATE

## 2021-12-12 ENCOUNTER — Encounter: Payer: Medicare HMO | Admitting: Internal Medicine

## 2021-12-20 NOTE — Progress Notes (Addendum)
Pt spouse asked to confirm the death of William Avila by staff RN. [name] and [name] present at the time of death and during death confirmation assessment.  Death confirmation assessment by two RN: Identify confirmed from wrist band, Pts eyes closed, no signs of life or respiratory effort. No palpable carotid pulse for 5 minutes, no heart or respiratory sounds for 5 minutes, No reading of BP. Death confirmed at 2021-12-19 at 2035 PM. Notified the oncall provider B. Randol Kern, NP at 2123 PM. Notified [facility] and Bed placement. Pt body was transferred to  morgue. Spouse aware of pt wedding band on finger, spouse will retrieve from funeral home.

## 2021-12-20 NOTE — Death Summary Note (Signed)
Death Summary  William Avila BOF:[id number] DOB: [birth year] DOA: 2021-12-12  PCP: [name], MD  Admit date: December 12, 2021 Date of Death: 12-16-21 Time of Death: 2022/07/14: 07-15-33 pm Notification: [name], MD notified of death of Dec 18, 2021   History of present illness:  William Avila is a 76 y.o. male with PMH significant for metastatic sarcoma of the lungs(follows at [facility] on trial medication), hyperlipidemia, hypertension, bradycardia with pacemaker presented in the ED with complaints of generalized weakness and decreased p.o. intake. Patient was hypothermic, with tachypnea, tachycardic and hypotensive on arrival.  Lactic acid 7.6 associated with AKI and elevated LFTs, elevated troponins consistent with severe sepsis.  Patient was admitted in the ICU for further management.  Patient had worsening oxygenation requiring nonrebreather, bedside ultrasound showed loculated right pleural effusion, hemoglobin dropped from 8.6-6.3.  CT finding concerning for pleural-based mass on the right that may be bleeding into pleural space.  Palliative care consulted, Patient has not urinated in 48 hours.  Patient and family has decided comfort care.  Care transitioned to comfort care.  Plan: Anticipate hospital death. Patient passed away and was pronounced dead @ 20: 35 pm. Family was at bed side.   Final Diagnoses:  1.   Metastatic Sarcome of lung 2.   Severe Sepsis 3.   Pleural-based mass eroding vessels bleeding into pleural space.    The results of significant diagnostics from this hospitalization (including imaging, microbiology, ancillary and laboratory) are listed below for reference.    Significant Diagnostic Studies: CT ABDOMEN PELVIS WO CONTRAST  Result Date: 11/26/2021 CLINICAL DATA:  Metastatic sarcoma. Anemia. Evaluate for retroperitoneal bleed. Shortness of breath and neutropenia. EXAM: CT ABDOMEN AND PELVIS WITHOUT CONTRAST TECHNIQUE: Multidetector CT imaging of the abdomen and  pelvis was performed following the standard protocol without IV contrast. RADIATION DOSE REDUCTION: This exam was performed according to the departmental dose-optimization program which includes automated exposure control, adjustment of the mA and/or kV according to patient size and/or use of iterative reconstruction technique. COMPARISON:  CT chest, abdomen and pelvis 07/09/2021 as well as chest CT 10/25/2021 FINDINGS: Lower chest: Interval worsening of bilateral pleural effusions right worse than left. Continued evidence of patient's known pulmonary metastases within the lung bases. There is been interval development of an oval 6.6 cm mass over the left lower lobe within the effusion. Masslike 9.2 cm area over the right infrahilar region which is new. Patchy areas of increased attenuation within the right effusion over the right lung base some which are masslike and some more ill-defined as this may represent combination of metastatic foci and hemorrhage. Largest masslike area in the right base measures approximately 9.4 cm. There is cardiomediastinal shift to the left. Cardiac pacer leads are present. Calcified plaque over the descending thoracic aorta. Hepatobiliary: Liver and biliary tree are unremarkable. Gallbladder is contracted. Pancreas: Normal. Spleen: Normal. Adrenals/Urinary Tract: Surgical clips over the expected region of the right adrenal gland. Left adrenal gland is normal. Kidneys are unchanged. Bladder is within normal. Stomach/Bowel: Stomach and small bowel are within normal. Appendix not visualized. Colon is unremarkable. Vascular/Lymphatic: Calcified plaque over the abdominal aorta which is normal in caliber. No adenopathy. Reproductive: Mild prominence of the prostate gland with impression upon the bladder base. Other: Multiple small masses over the bilateral retroperitoneal regions which are new and compatible with metastatic disease. New 1.7 cm mass in the subcutaneous fat of the right  gluteal region likely metastatic focus. Couple new small masses in the left peroneal and left perianal fat  likely metastatic disease or metastatic adenopathy. 5.1 cm mass in the anterior mesentery of the midline lower abdomen lymph compatible metastatic disease. Musculoskeletal: Fusion hardware with interbody fusion at the L4-5 and L5-S1 levels. Stable mild compression fractures involving L1 and L2. IMPRESSION: 1. Overall worsening of patient's metastatic disease from primary retroperitoneal liposarcoma. Interval worsening of patient's known pulmonary metastases within the lung bases right worse than left. Several of these areas within the right lung base are somewhat hyperdense and ill-defined and may be hemorrhagic debris in this patient with unexplained anemia. Worsening bilateral effusions right worse than left with cardiomediastinal shift to the left. 2. Metastatic disease in the abdomen/pelvis with interval development of multiple masses over the bilateral retroperitoneal regions as well as in the subcutaneous fat of the right gluteal region and left perineum/perianal fat likely metastatic disease. 5.1 cm mass in the anterior mesentery of the midline lower abdomen likely metastatic disease. 3. Aortic atherosclerosis. 4. Mild prominence of the prostate gland with impression upon the bladder base. 5. Stable mild compression fractures involving L1 and L2. Aortic Atherosclerosis (ICD10-I70.0). Electronically Signed   By: Marin Olp M.D.   On: 11/26/2021 13:52   DG Chest Port 1 View  Result Date: 11/26/2021 CLINICAL DATA:  76 year old male with hypoxia.  Metastatic sarcoma. EXAM: PORTABLE CHEST 1 VIEW COMPARISON:  Portable chest 12/05/2021 and earlier. FINDINGS: Portable AP semi upright view at 0550 hours. Stable right chest Port-A-Cath, accessed. Stable left chest pacemaker. Ongoing right pleural effusion, moderate to large. No superimposed pneumothorax. Upper lung pulmonary vascularity remains within normal  limits. Left lung base effusion superimposed on rim calcified 9 cm pulmonary metastasis again noted. Overall ventilation has not significantly changed from yesterday. Stable visible mediastinal contours. Prior cervical ACDF. Upper abdominal surgical clips. Visible upper abdominal bowel gas appears stable from last month. IMPRESSION: Stable ventilation from yesterday. Right greater than left pleural effusions superimposed on pulmonary metastatic disease. Electronically Signed   By: Genevie Ann M.D.   On: 11/26/2021 06:01   US Abdomen Limited RUQ (LIVER/GB)  Result Date: 12/11/2021 CLINICAL DATA:  Elevated liver enzymes EXAM: ULTRASOUND ABDOMEN LIMITED RIGHT UPPER QUADRANT COMPARISON:  None Available. FINDINGS: Gallbladder: No gallstones or wall thickening visualized. No sonographic Murphy sign noted by sonographer. Common bile duct: Diameter: 3 mm in proximal diameter Liver: No focal lesion identified. Within normal limits in parenchymal echogenicity. Portal vein is patent on color Doppler imaging with normal direction of blood flow towards the liver. Other: Small right pleural effusion noted IMPRESSION: 1. Normal right upper quadrant sonogram. 2. Small right pleural effusion. Electronically Signed   By: Fidela Salisbury M.D.   On: 11/30/2021 21:33   ECHOCARDIOGRAM COMPLETE  Result Date: 12/06/2021    ECHOCARDIOGRAM REPORT   Patient Name:   JONAVAN VANHORN Date of Exam: 12/08/2021 Medical Rec #:  [id number]           Height:       71.0 in Accession #:    [id number]          Weight:       143.0 lb Date of Birth:  10/03/1945           BSA:          1.828 m Patient Age:    3 years            BP:           103/76 mmHg Patient Gender: M  HR:           115 bpm. Exam Location:  Inpatient Procedure: 2D Echo STAT ECHO Indications:    shock. pericardial effusion.  History:        Patient has prior history of Echocardiogram examinations, most                 recent 04/10/2021. Defibrillator, pulmonary  metastases;                 Arrythmias:Atrial Fibrillation.  Sonographer:    Johny Chess RDCS Referring Phys: [id number] Julian Hy  Sonographer Comments: Technically difficult study due to poor echo windows, Technically challenging study due to limited acoustic windows, suboptimal parasternal window and suboptimal subcostal window. Image acquisition challenging due to respiratory motion and patient in pain. IMPRESSIONS  1. Left ventricular ejection fraction, by estimation, is 70 to 75%. The left ventricle has hyperdynamic function. The left ventricle has no regional wall motion abnormalities. Left ventricular diastolic function could not be evaluated.  2. Right ventricular systolic function is normal. The right ventricular size is normal. There is normal pulmonary artery systolic pressure.  3. A small pericardial effusion is present. The pericardial effusion is circumferential. There is no evidence of cardiac tamponade.  4. The mitral valve is normal in structure. Mild to moderate mitral valve regurgitation. No evidence of mitral stenosis.  5. The aortic valve was not well visualized. Aortic valve regurgitation is not visualized. No aortic stenosis is present. Comparison(s): Image quality is poor. The larger fluid collection in the chest appears to be a left pleural effusion. There is also a small pericardial effusion. There are no echo signs of tamponade, but arrhythmia and image quality limits the evaluation. Ascites is also present. FINDINGS  Left Ventricle: Left ventricular ejection fraction, by estimation, is 70 to 75%. The left ventricle has hyperdynamic function. The left ventricle has no regional wall motion abnormalities. The left ventricular internal cavity size was normal in size. There is no left ventricular hypertrophy. Left ventricular diastolic function could not be evaluated due to nondiagnostic images. Left ventricular diastolic function could not be evaluated. Right Ventricle: The right  ventricular size is normal. No increase in right ventricular wall thickness. Right ventricular systolic function is normal. There is normal pulmonary artery systolic pressure. The tricuspid regurgitant velocity is 2.59 m/s, and  with an assumed right atrial pressure of 5 mmHg, the estimated right ventricular systolic pressure is 40.0 mmHg. Left Atrium: Left atrial size was normal in size. Right Atrium: Right atrial size was normal in size. Pericardium: A small pericardial effusion is present. The pericardial effusion is circumferential. There is no evidence of cardiac tamponade. Mitral Valve: The mitral valve is normal in structure. Mild to moderate mitral valve regurgitation. No evidence of mitral valve stenosis. Tricuspid Valve: The tricuspid valve is normal in structure. Tricuspid valve regurgitation is mild. Aortic Valve: The aortic valve was not well visualized. Aortic valve regurgitation is not visualized. No aortic stenosis is present. Pulmonic Valve: The pulmonic valve was not well visualized. Aorta: The aortic root is normal in size and structure. IAS/Shunts: The interatrial septum was not well visualized. Additional Comments: There is pleural effusion in the left lateral region. Mild ascites is present.  LEFT ATRIUM             Index        RIGHT ATRIUM           Index LA Vol (A2C):   40.7 ml 22.26 ml/m  RA Area:     11.20 cm LA Vol (A4C):   25.6 ml 14.00 ml/m  RA Volume:   24.40 ml  13.35 ml/m LA Biplane Vol: 33.5 ml 18.32 ml/m   AORTA Ao Asc diam: 3.70 cm TRICUSPID VALVE TR Peak grad:   26.8 mmHg TR Vmax:        259.00 cm/s Dani Gobble Croitoru MD Electronically signed by Sanda Klein MD Signature Date/Time: 12/17/2021/8:03:50 PM    Final    DG Chest Port 1 View  Result Date: 12/04/2021 CLINICAL DATA:  Lung cancer, weakness. EXAM: PORTABLE CHEST 1 VIEW COMPARISON:  10/25/2021 and CT chest 10/25/2021. FINDINGS: Trachea is midline. Heart size is grossly stable. Thoracic aorta is calcified. Pacemaker lead  tips are seen in the right atrium and right ventricle. Right IJ power port tip is in the SVC. Large right perihilar mass and partially calcified left lower lobe mass. New large right pleural effusion with volume loss in the right lung base. Somewhat improved left pleural effusion with left basilar airspace consolidation. IMPRESSION: 1. New large right pleural effusion with volume loss in the right lung base. 2. Right perihilar and left lower lobe masses. Right infrahilar mass is obscured. 3. Decreased left pleural effusion with consolidation in the left lung base, as on 10/25/2021. Electronically Signed   By: Lorin Picket M.D.   On: 12/04/2021 14:56    Microbiology: Recent Results (from the past 240 hour(s))  Culture, blood (routine x 2)     Status: None   Collection Time: 12/05/2021  2:28 PM   Specimen: BLOOD  Result Value Ref Range Status   Specimen Description   Final    BLOOD PORTA CATH Performed at [facility] [address], [place], [place] [address]    Special Requests   Final    BOTTLES DRAWN AEROBIC AND ANAEROBIC Blood Culture adequate volume Performed at [place] [address], [place], [place] [address]    Culture   Final    NO GROWTH 5 DAYS Performed at [facility], [place] [address], [place], [place] [address]    Report Status 11/30/2021 FINAL  Final  Culture, blood (routine x 2)     Status: None   Collection Time: 12/01/2021  2:42 PM   Specimen: BLOOD  Result Value Ref Range Status   Specimen Description   Final    BLOOD SITE NOT SPECIFIED Performed at [facility] [address], [place], [place] [address]    Special Requests   Final    BOTTLES DRAWN AEROBIC AND ANAEROBIC Blood Culture adequate volume Performed at [facility] [address], [place], [place] [address]    Culture   Final    NO GROWTH 5 DAYS Performed at [facility], [place] [address], [place], [place]  [address]    Report Status 11/30/2021 FINAL  Final  SARS Coronavirus 2 by RT PCR (hospital order, performed in [facility] hospital lab) *cepheid single result test* Anterior Nasal Swab     Status: None   Collection Time: 12/08/2021  4:23 PM   Specimen: Anterior Nasal Swab  Result Value Ref Range Status   SARS Coronavirus 2 by RT PCR NEGATIVE NEGATIVE Final    Comment: (NOTE) SARS-CoV-2 target nucleic acids are NOT DETECTED.  The SARS-CoV-2 RNA is generally detectable in upper and lower respiratory specimens during the acute phase of infection. The lowest concentration of SARS-CoV-2 viral copies this assay can detect is  250 copies / mL. A negative result does not preclude SARS-CoV-2 infection and should not be used as the sole basis for treatment or other patient management decisions.  A negative result may occur with improper specimen collection / handling, submission of specimen other than nasopharyngeal swab, presence of viral mutation(s) within the areas targeted by this assay, and inadequate number of viral copies (<250 copies / mL). A negative result must be combined with clinical observations, patient history, and epidemiological information.  Fact Sheet for Patients:   https://www.patel.info/  Fact Sheet for Healthcare Providers: https://hall.com/  This test is not yet approved or  cleared by the [place] FDA and has been authorized for detection and/or diagnosis of SARS-CoV-2 by FDA under an Emergency Use Authorization (EUA).  This EUA will remain in effect (meaning this test can be used) for the duration of the COVID-19 declaration under Section 564(b)(1) of the Act, 21 U.S.C. section 360bbb-3(b)(1), unless the authorization is terminated or revoked sooner.  Performed at [facility], [place] [address] [place], [place] [address]   MRSA Next Gen by PCR, Nasal     Status: None   Collection Time: 12/06/2021  6:50  PM  Result Value Ref Range Status   MRSA by PCR Next Gen NOT DETECTED NOT DETECTED Final    Comment: (NOTE) The GeneXpert MRSA Assay (FDA approved for NASAL specimens only), is one component of a comprehensive MRSA colonization surveillance program. It is not intended to diagnose MRSA infection nor to guide or monitor treatment for MRSA infections. Test performance is not FDA approved in patients less than 57 years old. Performed at [facility], [place] [address], [place], [place] [address]   Expectorated Sputum Assessment w Gram Stain, Rflx to Resp Cult     Status: None   Collection Time: 11/26/21  8:30 AM   Specimen: Expectorated Sputum  Result Value Ref Range Status   Specimen Description EXPECTORATED SPUTUM  Final   Special Requests NONE  Final   Sputum evaluation   Final    THIS SPECIMEN IS ACCEPTABLE FOR SPUTUM CULTURE Performed at [facility] [place] [address], [place], [place] [address]    Report Status 11/26/2021 FINAL  Final  Culture, Respiratory w Gram Stain     Status: None   Collection Time: 11/26/21  8:30 AM  Result Value Ref Range Status   Specimen Description   Final    EXPECTORATED SPUTUM Performed at [facility], [place] [address], [place], [place] [address]    Special Requests   Final    NONE Reflexed from [id number] Performed at [facility], [place] [address] [place], [place] [address]    Gram Stain   Final    NO WBC SEEN FEW GRAM POSITIVE COCCI IN PAIRS FEW GRAM NEGATIVE RODS RARE GRAM POSITIVE RODS    Culture   Final    MODERATE Normal respiratory flora-no Staph aureus or Pseudomonas seen Performed at [facility], [address], [place],  [address]    Report Status 11/28/2021 FINAL  Final     Labs: Basic Metabolic Panel: Recent Labs  Lab 11/27/2021 1425 11/26/21 0436 11/27/21 0836  NA 136 138 141  K 4.5 4.4 4.1  CL 106 109 111  CO2 15* 21* 22  GLUCOSE 179*  101* 101*  BUN 43* 45* 53*  CREATININE 2.48* 2.37* 2.15*  CALCIUM 8.6* 8.0* 8.3*  MG 2.8* 2.3  --   PHOS  --  4.7*  --    Liver Function Tests: Recent Labs  Lab 11/19/2021 1425 11/27/21 0836  AST 115* 26  ALT 173* 73*  ALKPHOS 950* 589*  BILITOT 1.1 1.2  PROT 6.0* 5.5*  ALBUMIN 2.7* 2.5*   No results for input(s): "LIPASE", "AMYLASE" in the last 168 hours. No results for input(s): "AMMONIA" in the last 168 hours. CBC: Recent Labs  Lab 12/19/2021 1425 11/26/21 0436 11/26/21 0830 11/26/21 1708  WBC 7.7 9.5  --  10.0  NEUTROABS 7.1  --   --   --   HGB 8.2* 6.3*  --  7.5*  HCT 26.1* 19.7*  --  23.0*  MCV 103.6* 102.6*  --  97.9  PLT 322 242 299 266   Cardiac Enzymes: No results for input(s): "CKTOTAL", "CKMB", "CKMBINDEX", "TROPONINI" in the last 168 hours. D-Dimer No results for input(s): "DDIMER" in the last 72 hours. BNP: Invalid input(s): "POCBNP" CBG: No results for input(s): "GLUCAP" in the last 168 hours. Anemia work up No results for input(s): "VITAMINB12", "FOLATE", "FERRITIN", "TIBC", "IRON", "RETICCTPCT" in the last 72 hours. Urinalysis    Component Value Date/Time   COLORURINE YELLOW 11/26/2021 0135   APPEARANCEUR HAZY (A) 11/26/2021 0135   LABSPEC 1.019 11/26/2021 0135   PHURINE 5.0 11/26/2021 0135   GLUCOSEU NEGATIVE 11/26/2021 0135   HGBUR NEGATIVE 11/26/2021 0135   BILIRUBINUR NEGATIVE 11/26/2021 0135   KETONESUR NEGATIVE 11/26/2021 0135   PROTEINUR 30 (A) 11/26/2021 0135   UROBILINOGEN 0.2 09/14/2010 1217   NITRITE NEGATIVE 11/26/2021 0135   LEUKOCYTESUR TRACE (A) 11/26/2021 0135   Sepsis Labs Recent Labs  Lab 12/12/2021 1425 11/26/21 0436 11/26/21 1708  WBC 7.7 9.5 10.0   SIGNED:  Shawna Clamp, MD  [facility] 12/01/2021, 8:23 AM Pager   If 7PM-7AM, please contact night-coverage

## 2021-12-20 NOTE — Progress Notes (Signed)
No change in pt overall condition. Pt breathing remains comfortable and unlabored on Hydromorphone gtt at 4mg / hr. Family remains at bedside. Rn will continue to monitor.

## 2021-12-20 NOTE — Progress Notes (Signed)
[facility] [place] Mr Meriwether admitted with sepsis from pneumonia, complicated by metastatic sarcoma, atrial fibrillation with RVR and hepatic dysfunction, with course progressing to comfort care, passed away with family by his side on this day, 12-25-22 at 07-23-33 PM.  Death pronounced by 2 Rns at bedside  Kathlene Cote NP

## 2021-12-20 NOTE — Progress Notes (Signed)
PROGRESS NOTE    William Avila  GQQ:[id number] DOB: November 17, 1945 DOA: 12/13/2021 PCP: [name], MD   Brief Narrative:  This 76 years old male with PMH significant for metastatic sarcoma of the lungs(follows at [facility] on trial medication), hyperlipidemia, hypertension, bradycardia with pacemaker presented in the ED with complaints of generalized weakness and decreased p.o. intake. Patient was hypothermic, tachypnea, tachycardic and hypotensive on arrival.  Lactic acid 7.6 associated with AKI and elevated LFTs, elevated troponins consistent with severe sepsis.  Patient was admitted in the ICU for further management.  Patient had worsening oxygenation requiring nonrebreather, bedside ultrasound showed loculated right pleural effusion, hemoglobin dropped from 8.6-6.3.  CT finding concerning for pleural-based mass on the right that may be bleeding into pleural space.  Palliative care consulted, Patient has not urinated in 48 hours.  Patient and family has decided comfort care.  Care transitioned to comfort care.  Anticipate hospital death. Assessment & Plan:   Principal Problem:   Sepsis ([place]) Active Problems:   AKI (acute kidney injury) ([place])   Atrial fibrillation with RVR ([facility])   Elevated LFTs   Generalized weakness   Anemia in other chronic diseases classified elsewhere  Severe sepsis secondary to healthcare associated pneumonia. Metastatic sarcoma  Atrial fibrillation on Eliquis Acute on chronic anemia acute blood loss anemia from pulmonary mets. Moderate protein calorie malnutrition. Pleural-based mass eroding vessels bleeding into pleural space.  Care transitioned to comfort care. Continue Robinul for secretions. Continue Dilaudid gtt. for pain control and comfort. Continue Zofran for nausea. All measures to promote patient's comfort. Anticipate hospital death.   DVT prophylaxis: SCDS Code Status: DNR, full comfort care Family Communication: Daughter and wife at  bedside Disposition Plan: Anticipate hospital death. Consultants:  PCCM  Procedures: None Antimicrobials: None Subjective: Patient was seen and examined at bedside.  Patient is gasping remains on nonrebreather.   Family is aware and at bedside.  Patient is on full comfort care.  Service dog is bedside.  Objective: Vitals:   11/27/21 2357 11/28/21 0828 11/28/21 1140 2021-12-15 1400  BP: 103/77     Pulse: 60     Resp: (!) 24   (!) 6  Temp: 99 F (37.2 C)     TempSrc: Axillary     SpO2: 91% (!) 88% (!) 88%   Weight:      Height:        Intake/Output Summary (Last 24 hours) at 2021-12-15 1444 Last data filed at 12-15-21 0527 Gross per 24 hour  Intake 200 ml  Output --  Net 200 ml   Filed Weights   12/10/2021 1900 11/26/21 0500 11/27/21 0500  Weight: 66.5 kg 68.8 kg 67.6 kg    Examination:  General exam: Appears comfortable, gasping, RR 6, not responsive. Respiratory system: Clear to auscultation. Respiratory effort normal. Cardiovascular system: S1 & S2 heard, regular rate and rhythm, no murmur. Gastrointestinal system: Abdomen is soft, BS+ Central nervous system: Not responsive.  Extremities: No edema, no cyanosis, no clubbing. Skin: No rashes, lesions or ulcers Psychiatry: Not assessed.    Data Reviewed: I have personally reviewed following labs and imaging studies  CBC: Recent Labs  Lab 11/28/2021 1425 11/26/21 0436 11/26/21 0830 11/26/21 1708  WBC 7.7 9.5  --  10.0  NEUTROABS 7.1  --   --   --   HGB 8.2* 6.3*  --  7.5*  HCT 26.1* 19.7*  --  23.0*  MCV 103.6* 102.6*  --  97.9  PLT 322 242 299 266  Basic Metabolic Panel: Recent Labs  Lab 11/30/2021 1425 11/26/21 0436 11/27/21 0836  NA 136 138 141  K 4.5 4.4 4.1  CL 106 109 111  CO2 15* 21* 22  GLUCOSE 179* 101* 101*  BUN 43* 45* 53*  CREATININE 2.48* 2.37* 2.15*  CALCIUM 8.6* 8.0* 8.3*  MG 2.8* 2.3  --   PHOS  --  4.7*  --    GFR: Estimated Creatinine Clearance: 27.9 mL/min (A) (by C-G  formula based on SCr of 2.15 mg/dL (H)). Liver Function Tests: Recent Labs  Lab 11/24/2021 1425 11/27/21 0836  AST 115* 26  ALT 173* 73*  ALKPHOS 950* 589*  BILITOT 1.1 1.2  PROT 6.0* 5.5*  ALBUMIN 2.7* 2.5*   No results for input(s): "LIPASE", "AMYLASE" in the last 168 hours. No results for input(s): "AMMONIA" in the last 168 hours. Coagulation Profile: Recent Labs  Lab 12/08/2021 1425 11/26/21 0830  INR 2.3* 2.0*   Cardiac Enzymes: No results for input(s): "CKTOTAL", "CKMB", "CKMBINDEX", "TROPONINI" in the last 168 hours. BNP (last 3 results) No results for input(s): "PROBNP" in the last 8760 hours. HbA1C: No results for input(s): "HGBA1C" in the last 72 hours. CBG: No results for input(s): "GLUCAP" in the last 168 hours. Lipid Profile: No results for input(s): "CHOL", "HDL", "LDLCALC", "TRIG", "CHOLHDL", "LDLDIRECT" in the last 72 hours. Thyroid Function Tests: No results for input(s): "TSH", "T4TOTAL", "FREET4", "T3FREE", "THYROIDAB" in the last 72 hours. Anemia Panel: No results for input(s): "VITAMINB12", "FOLATE", "FERRITIN", "TIBC", "IRON", "RETICCTPCT" in the last 72 hours. Sepsis Labs: Recent Labs  Lab 11/24/2021 1425 11/26/2021 1625  LATICACIDVEN 7.6* 4.4*    Recent Results (from the past 240 hour(s))  Culture, blood (routine x 2)     Status: None (Preliminary result)   Collection Time: 12/12/2021  2:28 PM   Specimen: BLOOD  Result Value Ref Range Status   Specimen Description   Final    BLOOD PORTA CATH Performed at [facility] [address], [place], [place] [address]    Special Requests   Final    BOTTLES DRAWN AEROBIC AND ANAEROBIC Blood Culture adequate volume Performed at [facility] [address] [place], [place] [address]    Culture   Final    NO GROWTH 4 DAYS Performed at [facility], [place] [address], [place], [place] [address]    Report Status PENDING  Incomplete  Culture, blood (routine x 2)      Status: None (Preliminary result)   Collection Time: 12/13/2021  2:42 PM   Specimen: BLOOD  Result Value Ref Range Status   Specimen Description   Final    BLOOD SITE NOT SPECIFIED Performed at [facility] [address] [place], [place] [address]    Special Requests   Final    BOTTLES DRAWN AEROBIC AND ANAEROBIC Blood Culture adequate volume Performed at [facility] [address], [place],  [address]    Culture   Final    NO GROWTH 4 DAYS Performed at [facility], [place] [address], [place],  [address]    Report Status PENDING  Incomplete  SARS Coronavirus 2 by RT PCR (hospital order, performed in [facility] hospital lab) *cepheid single result test* Anterior Nasal Swab     Status: None   Collection Time: 11/28/2021  4:23 PM   Specimen: Anterior Nasal Swab  Result Value Ref Range Status   SARS Coronavirus 2 by RT PCR NEGATIVE  NEGATIVE Final    Comment: (NOTE) SARS-CoV-2 target nucleic acids are NOT DETECTED.  The SARS-CoV-2 RNA is generally detectable in upper and lower respiratory specimens during the acute phase of infection. The lowest concentration of SARS-CoV-2 viral copies this assay can detect is 250 copies / mL. A negative result does not preclude SARS-CoV-2 infection and should not be used as the sole basis for treatment or other patient management decisions.  A negative result may occur with improper specimen collection / handling, submission of specimen other than nasopharyngeal swab, presence of viral mutation(s) within the areas targeted by this assay, and inadequate number of viral copies (<250 copies / mL). A negative result must be combined with clinical observations, patient history, and epidemiological information.  Fact Sheet for Patients:   https://www.patel.info/  Fact Sheet for Healthcare Providers: https://hall.com/  This test is not yet approved or   cleared by the [place] FDA and has been authorized for detection and/or diagnosis of SARS-CoV-2 by FDA under an Emergency Use Authorization (EUA).  This EUA will remain in effect (meaning this test can be used) for the duration of the COVID-19 declaration under Section 564(b)(1) of the Act, 21 U.S.C. section 360bbb-3(b)(1), unless the authorization is terminated or revoked sooner.  Performed at [facility], [place] [address], [place], [place] [address]   MRSA Next Gen by PCR, Nasal     Status: None   Collection Time: 12/18/2021  6:50 PM  Result Value Ref Range Status   MRSA by PCR Next Gen NOT DETECTED NOT DETECTED Final    Comment: (NOTE) The GeneXpert MRSA Assay (FDA approved for NASAL specimens only), is one component of a comprehensive MRSA colonization surveillance program. It is not intended to diagnose MRSA infection nor to guide or monitor treatment for MRSA infections. Test performance is not FDA approved in patients less than 64 years old. Performed at [facility], [place] [address] [place], [place] [address]   Expectorated Sputum Assessment w Gram Stain, Rflx to Resp Cult     Status: None   Collection Time: 11/26/21  8:30 AM   Specimen: Expectorated Sputum  Result Value Ref Range Status   Specimen Description EXPECTORATED SPUTUM  Final   Special Requests NONE  Final   Sputum evaluation   Final    THIS SPECIMEN IS ACCEPTABLE FOR SPUTUM CULTURE Performed at [facility], [place] [address] [place], [place] [address]    Report Status 11/26/2021 FINAL  Final  Culture, Respiratory w Gram Stain     Status: None   Collection Time: 11/26/21  8:30 AM  Result Value Ref Range Status   Specimen Description   Final    EXPECTORATED SPUTUM Performed at [facility], [place] [address] [place], [place] [address]    Special Requests   Final    NONE Reflexed from [phone] Performed at [facility], [place] [address], [place], [place] [address]    Gram Stain   Final    NO WBC SEEN FEW GRAM POSITIVE COCCI IN PAIRS FEW GRAM NEGATIVE RODS RARE GRAM POSITIVE RODS    Culture   Final    MODERATE Normal respiratory flora-no Staph aureus or Pseudomonas seen Performed at [facility], [address] [place], [place] [address]    Report Status 11/28/2021 FINAL  Final    Radiology Studies: No results found.  Scheduled Meds: Continuous Infusions:  sodium chloride 10 mL/hr at Dec 27, 2021 0527   HYDROmorphone  2 mg/hr (Dec 08, 2021 1408)   promethazine (PHENERGAN) injection (IM or IVPB)       LOS: 4 days    Time spent: 35 mins    Bertrice Leder, MD [facility]   If 7PM-7AM, please contact night-coverage

## 2021-12-20 NOTE — Progress Notes (Signed)
Pt breathing appeared comfortable and not labored with last rounding. Family at bedside had no concerns overall. The family has noticed that pt breathing has slowed down to about 6 respirations per minute at times, otherwise around 9-12 per minute. Rn encouraged family to call with needs or concerns.

## 2021-12-20 DEATH — deceased

## 2022-03-24 ENCOUNTER — Ambulatory Visit: Payer: Medicare HMO | Admitting: Cardiology
